# Patient Record
Sex: Female | Born: 1965
Health system: Southern US, Community
[De-identification: ages and names within clinical notes are randomized; demographics above are authoritative.]

## PROBLEM LIST (undated history)

## (undated) DIAGNOSIS — R350 Frequency of micturition: Secondary | ICD-10-CM

## (undated) DIAGNOSIS — R351 Nocturia: Secondary | ICD-10-CM

## (undated) DIAGNOSIS — Z923 Personal history of irradiation: Secondary | ICD-10-CM

## (undated) DIAGNOSIS — F419 Anxiety disorder, unspecified: Secondary | ICD-10-CM

## (undated) DIAGNOSIS — J302 Other seasonal allergic rhinitis: Secondary | ICD-10-CM

## (undated) DIAGNOSIS — N3010 Interstitial cystitis (chronic) without hematuria: Secondary | ICD-10-CM

## (undated) DIAGNOSIS — Z8619 Personal history of other infectious and parasitic diseases: Secondary | ICD-10-CM

## (undated) DIAGNOSIS — G35 Multiple sclerosis: Secondary | ICD-10-CM

## (undated) DIAGNOSIS — Z8709 Personal history of other diseases of the respiratory system: Secondary | ICD-10-CM

## (undated) DIAGNOSIS — R7303 Prediabetes: Secondary | ICD-10-CM

## (undated) DIAGNOSIS — M1990 Unspecified osteoarthritis, unspecified site: Secondary | ICD-10-CM

## (undated) DIAGNOSIS — G43909 Migraine, unspecified, not intractable, without status migrainosus: Secondary | ICD-10-CM

## (undated) DIAGNOSIS — N329 Bladder disorder, unspecified: Secondary | ICD-10-CM

## (undated) DIAGNOSIS — D126 Benign neoplasm of colon, unspecified: Secondary | ICD-10-CM

## (undated) DIAGNOSIS — F329 Major depressive disorder, single episode, unspecified: Secondary | ICD-10-CM

## (undated) DIAGNOSIS — F32A Depression, unspecified: Secondary | ICD-10-CM

## (undated) DIAGNOSIS — C801 Malignant (primary) neoplasm, unspecified: Secondary | ICD-10-CM

## (undated) DIAGNOSIS — O1490 Unspecified pre-eclampsia, unspecified trimester: Secondary | ICD-10-CM

## (undated) DIAGNOSIS — K648 Other hemorrhoids: Secondary | ICD-10-CM

## (undated) DIAGNOSIS — R3915 Urgency of urination: Secondary | ICD-10-CM

## (undated) DIAGNOSIS — K602 Anal fissure, unspecified: Secondary | ICD-10-CM

## (undated) DIAGNOSIS — G9050 Complex regional pain syndrome I, unspecified: Secondary | ICD-10-CM

## (undated) HISTORY — DX: Depression, unspecified: F32.A

## (undated) HISTORY — DX: Other hemorrhoids: K64.8

## (undated) HISTORY — DX: Migraine, unspecified, not intractable, without status migrainosus: G43.909

## (undated) HISTORY — DX: Major depressive disorder, single episode, unspecified: F32.9

## (undated) HISTORY — PX: APPENDECTOMY: SHX54

## (undated) HISTORY — DX: Unspecified osteoarthritis, unspecified site: M19.90

## (undated) HISTORY — DX: Complex regional pain syndrome I, unspecified: G90.50

## (undated) HISTORY — DX: Anal fissure, unspecified: K60.2

## (undated) HISTORY — DX: Multiple sclerosis: G35

## (undated) HISTORY — PX: DILATION AND CURETTAGE OF UTERUS: SHX78

## (undated) HISTORY — DX: Unspecified pre-eclampsia, unspecified trimester: O14.90

## (undated) HISTORY — DX: Benign neoplasm of colon, unspecified: D12.6

## (undated) HISTORY — DX: Anxiety disorder, unspecified: F41.9

## (undated) HISTORY — DX: Interstitial cystitis (chronic) without hematuria: N30.10

---
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
## 1983-09-21 HISTORY — PX: OTHER SURGICAL HISTORY: SHX169

## 1992-09-20 DIAGNOSIS — O1490 Unspecified pre-eclampsia, unspecified trimester: Secondary | ICD-10-CM

## 1992-09-20 HISTORY — DX: Unspecified pre-eclampsia, unspecified trimester: O14.90

## 1999-01-21 ENCOUNTER — Other Ambulatory Visit: Admission: RE | Admit: 1999-01-21 | Discharge: 1999-01-21 | Payer: Self-pay | Admitting: Obstetrics and Gynecology

## 1999-12-28 ENCOUNTER — Encounter: Admission: RE | Admit: 1999-12-28 | Discharge: 1999-12-28 | Payer: Self-pay | Admitting: Specialist

## 1999-12-28 ENCOUNTER — Encounter: Payer: Self-pay | Admitting: Specialist

## 2000-02-09 ENCOUNTER — Encounter: Admission: RE | Admit: 2000-02-09 | Discharge: 2000-03-03 | Payer: Self-pay | Admitting: Specialist

## 2000-03-18 ENCOUNTER — Other Ambulatory Visit: Admission: RE | Admit: 2000-03-18 | Discharge: 2000-03-18 | Payer: Self-pay | Admitting: Obstetrics and Gynecology

## 2000-03-31 ENCOUNTER — Encounter: Payer: Self-pay | Admitting: Obstetrics and Gynecology

## 2000-03-31 ENCOUNTER — Ambulatory Visit (HOSPITAL_COMMUNITY): Admission: RE | Admit: 2000-03-31 | Discharge: 2000-03-31 | Payer: Self-pay | Admitting: Obstetrics and Gynecology

## 2001-04-21 ENCOUNTER — Encounter: Payer: Self-pay | Admitting: Obstetrics and Gynecology

## 2001-04-21 ENCOUNTER — Ambulatory Visit (HOSPITAL_COMMUNITY): Admission: RE | Admit: 2001-04-21 | Discharge: 2001-04-21 | Payer: Self-pay | Admitting: Obstetrics and Gynecology

## 2001-06-23 ENCOUNTER — Encounter: Payer: Self-pay | Admitting: Obstetrics and Gynecology

## 2001-06-23 ENCOUNTER — Ambulatory Visit (HOSPITAL_COMMUNITY): Admission: RE | Admit: 2001-06-23 | Discharge: 2001-06-23 | Payer: Self-pay | Admitting: Obstetrics and Gynecology

## 2001-08-15 ENCOUNTER — Other Ambulatory Visit: Admission: RE | Admit: 2001-08-15 | Discharge: 2001-08-15 | Payer: Self-pay | Admitting: Obstetrics and Gynecology

## 2002-04-10 ENCOUNTER — Encounter: Payer: Self-pay | Admitting: Obstetrics and Gynecology

## 2002-04-10 ENCOUNTER — Ambulatory Visit (HOSPITAL_COMMUNITY): Admission: RE | Admit: 2002-04-10 | Discharge: 2002-04-10 | Payer: Self-pay | Admitting: Obstetrics and Gynecology

## 2002-06-26 ENCOUNTER — Encounter: Payer: Self-pay | Admitting: Emergency Medicine

## 2002-06-26 ENCOUNTER — Emergency Department (HOSPITAL_COMMUNITY): Admission: EM | Admit: 2002-06-26 | Discharge: 2002-06-27 | Payer: Self-pay | Admitting: Emergency Medicine

## 2003-10-11 ENCOUNTER — Other Ambulatory Visit: Admission: RE | Admit: 2003-10-11 | Discharge: 2003-10-11 | Payer: Self-pay | Admitting: Obstetrics and Gynecology

## 2003-12-02 ENCOUNTER — Encounter (INDEPENDENT_AMBULATORY_CARE_PROVIDER_SITE_OTHER): Payer: Self-pay | Admitting: Specialist

## 2003-12-02 ENCOUNTER — Ambulatory Visit (HOSPITAL_COMMUNITY): Admission: RE | Admit: 2003-12-02 | Discharge: 2003-12-02 | Payer: Self-pay | Admitting: Obstetrics and Gynecology

## 2003-12-02 HISTORY — PX: OTHER SURGICAL HISTORY: SHX169

## 2007-07-30 ENCOUNTER — Emergency Department (HOSPITAL_COMMUNITY): Admission: EM | Admit: 2007-07-30 | Discharge: 2007-07-31 | Payer: Self-pay | Admitting: Emergency Medicine

## 2007-08-01 ENCOUNTER — Ambulatory Visit: Payer: Self-pay | Admitting: Family Medicine

## 2007-08-01 DIAGNOSIS — T148XXA Other injury of unspecified body region, initial encounter: Secondary | ICD-10-CM | POA: Insufficient documentation

## 2007-08-08 ENCOUNTER — Ambulatory Visit: Payer: Self-pay | Admitting: Family Medicine

## 2007-08-08 DIAGNOSIS — J069 Acute upper respiratory infection, unspecified: Secondary | ICD-10-CM | POA: Insufficient documentation

## 2007-09-18 ENCOUNTER — Telehealth (INDEPENDENT_AMBULATORY_CARE_PROVIDER_SITE_OTHER): Payer: Self-pay | Admitting: Family Medicine

## 2007-09-18 ENCOUNTER — Inpatient Hospital Stay (HOSPITAL_COMMUNITY): Admission: EM | Admit: 2007-09-18 | Discharge: 2007-09-20 | Payer: Self-pay | Admitting: Emergency Medicine

## 2007-09-19 ENCOUNTER — Ambulatory Visit: Payer: Self-pay | Admitting: Internal Medicine

## 2007-10-02 ENCOUNTER — Ambulatory Visit: Payer: Self-pay | Admitting: Family Medicine

## 2007-10-02 DIAGNOSIS — N390 Urinary tract infection, site not specified: Secondary | ICD-10-CM | POA: Insufficient documentation

## 2007-10-02 DIAGNOSIS — E876 Hypokalemia: Secondary | ICD-10-CM | POA: Insufficient documentation

## 2007-10-02 DIAGNOSIS — D509 Iron deficiency anemia, unspecified: Secondary | ICD-10-CM | POA: Insufficient documentation

## 2007-10-05 ENCOUNTER — Encounter (INDEPENDENT_AMBULATORY_CARE_PROVIDER_SITE_OTHER): Payer: Self-pay | Admitting: *Deleted

## 2007-10-05 LAB — CONVERTED CEMR LAB
Eosinophils Relative: 2.6 % (ref 0.0–5.0)
GFR calc Af Amer: 101 mL/min
GFR calc non Af Amer: 83 mL/min
Lymphocytes Relative: 31.9 % (ref 12.0–46.0)
Monocytes Relative: 1.1 % — ABNORMAL LOW (ref 3.0–11.0)
Neutrophils Relative %: 64.4 % (ref 43.0–77.0)
Potassium: 4.3 meq/L (ref 3.5–5.1)
RDW: 18.7 % — ABNORMAL HIGH (ref 11.5–14.6)
WBC: 11 10*3/uL — ABNORMAL HIGH (ref 4.5–10.5)

## 2007-10-18 ENCOUNTER — Other Ambulatory Visit: Admission: RE | Admit: 2007-10-18 | Discharge: 2007-10-18 | Payer: Self-pay | Admitting: Obstetrics and Gynecology

## 2007-11-22 ENCOUNTER — Encounter (INDEPENDENT_AMBULATORY_CARE_PROVIDER_SITE_OTHER): Payer: Self-pay | Admitting: Family Medicine

## 2011-02-02 NOTE — H&P (Signed)
NAMELEITH, Logan NO.:  [id number]   MEDICAL RECORD NO.:  [id number]          PATIENT TYPE:  INP   LOCATION:  0108                         FACILITY:  [facility]   PHYSICIAN:  Hollice Espy, M.D.DATE OF BIRTH:  1966-01-17   DATE OF ADMISSION:  09/18/2007  DATE OF DISCHARGE:                              HISTORY & PHYSICAL   PRIMARY CARE PHYSICIAN:  Dr. Leanne Chang.   CHIEF COMPLAINT:  Back pain radiating to the abdomen.   HISTORY OF PRESENT ILLNESS:  The patient is a 45 year old white female  with no past medical history who started having problems complaining of  dysuria on Saturday the 27th.  Her symptoms persisted for several days  and then she went to [facility] on the 28th and was seen and started  on p.o. Cipro.  However, her symptoms persisted with dysuria and then  she started having flank pain radiating to her abdomen today. She became  concerned and she came to the emergency room.  When she presented to the  emergency room she was found to have a white count of 11.6 thousand with  a 74% shift. The rest of her labs were notable for a hemoglobin of 9.8  with an MCV of 77.  She was also noted to have trace leukocyte esterase.  However, when they did a CT scan of the abdomen and pelvis this showed  signs consistent with a pyelonephritis on the right hand side.  The  patient was started on IV Rocephin.  Currently she complains of some  pains pushing her back, she says she feels like there are knives  stabbing, they are right greater than left, both radiating to the front.  She has some mild nausea but no headaches, vision changes, dysphagia, no  chest pain, palpitations, shortness of breath, wheeze, cough, no  abdominal pain other than described above.  No hematuria but she does  have dysuria, no constipation or diarrhea, focal extremity numbness,  weakness or pain.   REVIEW OF SYSTEMS:  Otherwise negative.   PAST MEDICAL HISTORY:  Previous  history of a spontaneous pneumothorax.   MEDICATIONS:  She is on p.o. Cipro started yesterday.   ALLERGIES:  DEMEROL AND MORPHINE.   SOCIAL HISTORY:  She denies tobacco, alcohol or hard drug use.  She  lives with her husband.   FAMILY HISTORY:  Noncontributory.   PHYSICAL EXAMINATION:  VITAL SIGNS:  On admission temperature is 100.1,  heart rate 120, now down to 79.  Blood pressure 123/84, respirations 28.  Oxygen saturation 99% on room air.  GENERAL:  She is alert and oriented x3, in no acute distress.  HEENT:  Normocephalic, atraumatic. Mucous membranes are slightly dry.  NECK:  She has no carotid bruits.  HEART:  Regular rate and rhythm S1, S2.  LUNGS:  Clear to auscultation bilaterally.  ABDOMEN:  Soft, nontender. She has minimal bilateral flank pain, no  generalized abdominal tenderness.  EXTREMITIES:  No clubbing, cyanosis or edema.   LABORATORY DATA:  Urinalysis with trace leukocyte esterase, white cells  0-2, sodium 136, potassium 3, chloride  103, bicarb 23, BUN 6, creatinine  0.7, glucose 109.   White count 11.6,  thousand, H&H are 9.8 and 29, MCV of 77, platelet  count 269,000, no shift. Liver function tests are normal with the  exception of albumin slightly low at 3.4, lipase level is normal.   CT scan notes pyelonephritis (this is passed down from the ER  attending).  The current radiology system is down.   ASSESSMENT AND PLAN:  1. Pyelonephritis. Treat with IV Rocephin plus p.r.n. pain, nausea,      and Tylenol.  2. Hypokalemia. Will replace.  3. Microcytic anemia.  Outpatient workup.      Hollice Espy, M.D.  Electronically Signed     SKK/MEDQ  D:  09/18/2007  T:  09/18/2007  Job:  [id number]   cc:   Leanne Chang, M.D.  Fax: [phone]

## 2011-02-02 NOTE — Discharge Summary (Signed)
NAMEANALIZ, TVEDT                 ACCOUNT NO.:  [id number]   MEDICAL RECORD NO.:  [id number]          PATIENT TYPE:  INP   LOCATION:  1532                         FACILITY:  [facility]   PHYSICIAN:  Raenette Rover. Felicity Coyer, MDDATE OF BIRTH:  June 25, 1966   DATE OF ADMISSION:  09/18/2007  DATE OF DISCHARGE:                               DISCHARGE SUMMARY   DISCHARGE DIAGNOSES:  1. Acute pyelonephritis by clinical symptoms and CT changes.  Urine      culture; no growth.  Continue oral antibiotics as detailed below.  2. Hypokalemia; etiology unclear.  Status post replacement and      resolved.  Discharge potassium 36.  3. Iron deficiency anemia in a perimenopausal woman.  Hemoglobin at      discharge 9.2.  Iron level 10.  4. Herpes labialis (oral cold sores).  Continue Valtrex therapy.  5. Tobacco abuse.  Recommend cessation.   DISCHARGE MEDICATIONS:  Include:  1. Ceftin 500 mg twice a day times 7 days.  2. Valtrex 500 mg daily times 7 days.  3. Iron sulfate 325 mg once daily.  4. The patient is instructed to stop taking Cipro.   HOSPITAL FOLLOWUP:  Hospital follow-up was with primary care physician,  Dr. Leanne Chang, for Monday, October 01, 2006, at 4 o'clock p.m.  She  was notified to return to the emergency room or call MD if recurrent  fever, inability to tolerate by mouth, or other problems prior to this  time.   DISPOSITION:  The patient is discharged home in medically stable and  improved condition.  She is tolerating by mouth, decreased pain,  afebrile, and hemodynamically stable.   HOSPITAL COURSE BY PROBLEMS:  1. Right flank pain with acute pyelonephritis.  The patient is a 45-      year-old woman recently started on Cipro by her primary medical      doctor for UTI; however due to progressive back/flank pain and      continued dysuria, she came to the emergency room for further      evaluation.  She was found to have a white count of 11.6 and      leukocyte esterase in her  urine, so a CT scan was performed for      further evaluation and did show signs consistent with right-sided      acute pyelonephritis given perinephric stranding.  She was thus      referred for admission and begun on IV Rocephin.  She remained      afebrile and her white count after 48 hours of therapy was reduced      to normal at 5.5.  Her pain symptoms have also improved with      minimal need for analgesia.  Her nausea symptoms have also      resolved.  She is tolerating p.o., including her medicines, and      will be changed to oral Ceftin to continue another 1 week of      therapy to complete 10 days of separate antibiotic therapy.  Cipro      prior  to admission was discontinued.  Again, urine culture showed      no growth to date suggesting probable sterilization by Cipro prior      to admission.  2. Iron deficiency anemia.  The patient's initial hemoglobin was 9.8.      She is perimenopausal with irregular and heavy periods at this      time.  She has no known personal history of cancer, but a strong      family history of cancer, including a sister who passed of breast      cancer and a father who recently passed of lung and throat cancer.      The patient is a smoker and she was advised of its increased      association with personal cancer risk and, therefore, her need for      cessation.  There was no ongoing evidence of acute blood loss at      this time and as she is hemodynamically stable, we will begin iron      supplementation and refer her to outpatient primary medical doctor      for further evaluation, including possible GYN evaluation, and/or      further cancer screening.  Of note, the patient is 45 years of age      and no history of colon cancer, but may also consider GI evaluation      if found to be for fecal occult positive, which was not tested      during this time.  Iron level of 10 with a normal ferritin, still      consistent with iron deficiency  anemia.  3. Herpes labialis.  The patient had an outbreak of oral cold sores      this hospitalization for which she was begun on Valtrex with      decreased symptomatic improvement, and will be continued on a 1-      week course of therapy for this with consideration of chronic      suppressive needs given history of frequent recurrence; defer to      primary medical doctor.  4. Hypokalemia.  Potassium of 3.0 on admission without clear cause.      She was not on diuretics, nor did she claim a history of GI loss.      This was supplemented and normalized at 3.6 prior to discharge.      Further outpatient followup on an as-needed basis per primary      medical doctor.      Valerie A. Felicity Coyer, MD  Electronically Signed     VAL/MEDQ  D:  09/20/2007  T:  09/20/2007  Job:  [id number]

## 2011-02-05 NOTE — Op Note (Signed)
NAME:  Stacy Logan, Stacy Logan                           ACCOUNT NO.:  [id number]   MEDICAL RECORD NO.:  [id number]                   PATIENT TYPE:  AMB   LOCATION:  SDC                                  FACILITY:  [facility]   PHYSICIAN:  Artist Pais, M.D.                 DATE OF BIRTH:  1966-07-19   DATE OF PROCEDURE:  12/02/2003  DATE OF DISCHARGE:                                 OPERATIVE REPORT   PREOPERATIVE DIAGNOSES:  1. Menometrorrhagia.  2. Endometrial polyp on sonohysterogram.   POSTOPERATIVE DIAGNOSES:  1. Menometrorrhagia.  2. Endometrial polyp on sonohysterogram.   PROCEDURES:  Dilatation and curettage, hysteroscopy, and polypectomy.   SURGEON:  Artist Pais, M.D.   ANESTHESIA:  Monitored anesthesia care plus 20 mL 1% lidocaine paracervical  block.   ESTIMATED BLOOD LOSS:  Minimal.   DRAINS:  None.   COMPLICATIONS:  None.   FINDINGS:  Polyp in endometrial lining, removed in its entirety.  I was  unable to place the scope past a ridge at the internal os.   DESCRIPTION OF OPERATION:  The patient was brought to the operating room,  identified on the operating room table.  After induction of adequate MAC  analgesia, the patient was placed in the dorsal lithotomy position and  prepped and draped in the usual sterile fashion.  The bladder was straight-  catheterized for approximately 50 mL of clear yellow urine.  The bimanual  examination was performed twice, and the patient both times was noted to be  retroverted.  The speculum was placed, the posterior lip of the cervix was  infiltrated with 1 mL of 1% lidocaine.  The remaining 20 mL were placed for  a paracervical block.  The uterus sounded to approximately 7 cm.  Interestingly, even though the uterus tracked in a retroverted fashion, the  endometrial canal deviated to the left and almost anteriorly despite the  fact that the uterus is retroverted.  I was able to gently dilate the  patient up to a #23 Pratt dilator.   Dilatation proceeded carefully and  gently to decrease the risk of uterine perforation.  I did just let the  small curette follow the natural path of the endocervical canal to begin the  dilatation process.  Subsequently the [facility] hysteroscope was placed and using  sorbitol as a distending medium, attempted to place the scope under direct  visualization into the endometrial cavity.  I was able to get it up to the  internal os; however, there was a ridge I could feel on dilatation that  would not resolve enough to pass the scope without force and I did not want  to cause a perforation.  I even changed the speculum to the anterior lip of  the cervix despite the fact that the uterus tracked retrovertedly but the  canal tracked to the left and anteriorly.  I could see up into the  endometrial cavity but was unable to get the scope past this ridge.  Subsequently I could not use the serrated curette as it would not pass past  the ridge, and I used the endocervical curette and curettage was performed  in a systematic clockwise fashion.  Initially at the fundus I did obtain the  polyp on the first curettage.  Subsequently the remainder of the uterus was  gently curetted until a good cry was heard all around.  I passed the Randall  stone forceps and additional tissue was obtained; in fact, copious tissue  was obtained and sent to pathology for examination.  I again placed the  scope and was able to get it up to the ridge again and could see that the  endometrium had been well-sampled as far as I could see; however, I could  not get the scope past the ridge even though I tried to go to a #25 dilator.  However, it was this surgeon's opinion that what we came to do here had been  accomplished and that I was able to obtain the polyp and endometrial  curettings.  At that point the procedure was then terminated.  The patient  tolerated the procedure well without apparent complications and was  transferred  to the recovery room in stable condition after all instrument,  sponge, and needle counts were correct.  The patient was given a postop D&C  sheet, urged to call with any problems, to take ibuprofen 400-600 mg every  six hours as needed for pain, and to call with any problems.  She will call  if she is soaking up a large pad an hour for three consecutive hours.  She  is urged to refrain from any heavy lifting for two weeks.  She will return  to the office in two weeks for a postop exam, and she will also refrain from  anything in her vagina until that time.  I have asked her not go to work for  24 hours.                                               Artist Pais, M.D.    DC/MEDQ  D:  12/02/2003  T:  12/02/2003  Job:  [id number]

## 2011-02-05 NOTE — H&P (Signed)
NAME:  Stacy Logan, Stacy Logan                           ACCOUNT NO.:  [id number]   MEDICAL RECORD NO.:  [id number]                   PATIENT TYPE:  AMB   LOCATION:  SDC                                  FACILITY:  [facility]   PHYSICIAN:  Artist Pais, M.D.                 DATE OF BIRTH:  08-24-1966   DATE OF ADMISSION:  12/02/2003  DATE OF DISCHARGE:                                HISTORY & PHYSICAL   HISTORY OF PRESENT ILLNESS:  The patient is a 45 year old Caucasian female,  para 1, who presented two weeks ago complaining of menstrual migraines, but  using Vivelle patch with excellent relief.  She had intercourse two to three  weeks ago with a new partner and has been complaining of bleeding three  times during the past month.  She bled for five days beginning on October 19, 2003.  Around November 04, 2003, she began bleeding and bled until  November 06, 2003.  Subsequently she bled on November 12, 2002, and November 14, 2003.  She feels very tired and wiped out.  She is complaining of cramps  and bloating.  She subsequently underwent a pelvic ultrasound which revealed  the uterus to be retroverted, 9 x 4.5 x 5.3 cm.  The endometrium was found  to be thick at 10 mm with two echogenic foci.  The right ovary was found to  have a mixed cystic mass measuring 48 x 45 mm containing cystic areas and  scattered __________ level echoes and a more solid level thought to be a  hemorrhagic cyst.  The left ovary had a cystic mass, 2.6 x 1.9, with thin  walls which were smooth, echo free, and avascular.  She declined STD  testing.  Because of her thick endometrium, she underwent a sonohysterogram  and was found to have a fundal posterior polyp measuring 11 x 5 x 10 mm.  Her sonohysterogram was performed on November 26, 2003, and at that point she  had been bleeding almost constantly she said for three weeks.  Although the  pad is not full, she does have to change the pad every day.  She was advised  to undergo a  D&C, hysteroscopy, and polypectomy.  The risks of surgery,  including anesthetic complication, hemorrhage, infection, damage to adjacent  structures, including bladder, bowel, blood vessels, and ureters were  discussed with the patient.  She was made aware of the risks of uterine  perforation which could result in overwhelming life-threatening hemorrhage  requiring an emergent hysterectomy or uterine perforation which could result  in bowel damage requiring emergent colostomy or which could result in  overwhelming life-threatening peritonitis.  She has expressed an  understanding of and acceptance of these results, but desires to proceed  with the surgery.  She does know that I have always thought that she might  be having anovulatory cycles as sometimes her cycles were  irregular.  She is  admonished after this to make sure that she keeps track of her periods.  She  does express understanding of and acceptance of these risks and does desire  to proceed with surgery.   OBSTETRICAL AND GYNECOLOGICAL HISTORY:  The patient uses condoms for  contraception.  Normally her cycles are regular every 28 days with a four to  five-day duration of flow.  She notes that her first two days are heavy and  she changes her sanitary protection four to five times daily, mostly for  fastidiousness.  She had a history of one abnormal Pap with a repeat being  normal.  Earlier a few years ago, she did have a history of some amenorrhea  which appears to have resolved.   PAST MEDICAL HISTORY:  1. Back pain.  2. Situational depression.  3. Oral HSV.   ALLERGIES:  No known drug allergies.   CURRENT MEDICATIONS:  1. Valtrex p.r.n. for fever blisters.  2. Paxil CR 12.5 mg daily for situational depression.   PAST SURGICAL HISTORY:  1. Cesarean section.  2. One additional laparotomy and laparoscopy, one for removal of ovarian     cyst and a laparoscopic surgery to assess for possible adhesion     formation.    FAMILY HISTORY:  There is no family history of colon, ovarian, or prostate  cancer.  Her sister was diagnosed at the age of 56 with breast cancer and  has hypertension.  She is currently 27 and alive and well.  She has another  sister, age 70, alive and well.  Her father is 56 with diabetes.  Her mother  is 60 with hypertension.  She has one young daughter, age 56, alive and well.   SOCIAL HISTORY:  The patient is self-employed as a Advertising copywriter.  Tobacco:  None.  Alcohol:  None.   REVIEW OF SYSTEMS:  Noncontributory, except as noted above.  Denies  headache, visual changes, chest pain, shortness of breath, abdominal pain,  change in bowel habits, unintentional weight loss, dysuria, urgency,  frequency, vaginal pruritus or discharge, and pain or bleeding with  intercourse.   PHYSICAL EXAMINATION:  GENERAL APPEARANCE:  A well-developed Caucasian  female.  VITAL SIGNS:  Blood pressure 110/80, heart rate 72, respiratory rate 16.  HEENT:  Normal.  NECK:  Supple without thyromegaly, adenopathy, or nodules.  CHEST:  Clear to auscultation.  BREASTS:  Symmetrical without masses.  No dimpling, retraction, or nipple  discharge.  CARDIAC:  Regular rate and rhythm without extra sounds or murmurs.  ABDOMEN:  Soft and nontender.  No hepatosplenomegaly or masses.  EXTREMITIES:  No edema.  NEUROLOGIC:  Oriented x 3.  Grossly normal.  PELVIC:  Normal external female genitalia.  No vulvar, vaginal, or cervical  lesions.  A Pap smear was performed and was noted to be within normal limits  on October 11, 2003.  Bimanual examination reveals the uterus to be small,  mobile, anteverted, and nontender without any adnexal mass palpated.  Subsequently on ultrasound she was found to have possible retroverted  uterus.  RECTAL:  Excellent sphincter tone.  Confirms pelvic exam.  No masses  palpated.  ASSESSMENT AND PLAN:  The patient is a 45 year old Caucasian female with  menometrorrhagia and endometrial  polyp.  At the time of her complete  physical, she did complain of dysmenorrhea and she was offered oral  contraceptives.  I plan to treat her with oral contraceptives initially and  then do an ultrasound if this  did not cause resolution of her dysmenorrhea,  but it may well be that the polyp is the etiology of this dysmenorrhea.  The  patient is admitted for a D&C, hysteroscopy, and polypectomy due to  menometrorrhagia and endometrial polyp found on sonohysterogram.  The risks  have been explained to her.  She expresses understanding of and acceptance  of the risks and desires to proceed with surgery.                                               Artist Pais, M.D.   DC/MEDQ  D:  12/01/2003  T:  12/01/2003  Job:  [id number]   cc:   [facility]  [address] [place].  [address]

## 2011-06-25 LAB — URINE MICROSCOPIC-ADD ON

## 2011-06-25 LAB — IRON AND TIBC
Iron: 10 — ABNORMAL LOW
Saturation Ratios: 3 — ABNORMAL LOW
TIBC: 297
UIBC: 287

## 2011-06-25 LAB — DIFFERENTIAL
Basophils Relative: 3 — ABNORMAL HIGH
Lymphocytes Relative: 13
Lymphs Abs: 1.5
Monocytes Absolute: 1.1 — ABNORMAL HIGH
Monocytes Relative: 10
Neutro Abs: 8.6 — ABNORMAL HIGH
Neutrophils Relative %: 74

## 2011-06-25 LAB — CBC
HCT: 27.2 — ABNORMAL LOW
HCT: 29.4 — ABNORMAL LOW
Hemoglobin: 9.8 — ABNORMAL LOW
MCV: 77.2 — ABNORMAL LOW
Platelets: 269
RBC: 3.82 — ABNORMAL LOW
RDW: 19.3 — ABNORMAL HIGH
WBC: 11.6 — ABNORMAL HIGH

## 2011-06-25 LAB — BASIC METABOLIC PANEL
BUN: 5 — ABNORMAL LOW
Calcium: 9.1
Chloride: 106
Creatinine, Ser: 0.73
GFR calc non Af Amer: >60
Glucose, Bld: 105 — ABNORMAL HIGH

## 2011-06-25 LAB — URINALYSIS, ROUTINE W REFLEX MICROSCOPIC
Bilirubin Urine: NEGATIVE
Glucose, UA: NEGATIVE
Ketones, ur: NEGATIVE
Nitrite: NEGATIVE
Specific Gravity, Urine: 1.001 — ABNORMAL LOW
Urobilinogen, UA: 0.2
pH: 6

## 2011-06-25 LAB — URINE CULTURE: Culture: NO GROWTH

## 2011-06-25 LAB — HEPATIC FUNCTION PANEL
ALT: 19
Alkaline Phosphatase: 89
Indirect Bilirubin: 0.7

## 2011-06-25 LAB — LIPASE, BLOOD: Lipase: 11

## 2014-01-16 ENCOUNTER — Encounter: Payer: Self-pay | Admitting: General Practice

## 2014-01-16 ENCOUNTER — Encounter: Payer: Self-pay | Admitting: Family Medicine

## 2014-01-16 ENCOUNTER — Ambulatory Visit (INDEPENDENT_AMBULATORY_CARE_PROVIDER_SITE_OTHER): Payer: 59 | Admitting: Family Medicine

## 2014-01-16 VITALS — BP 132/80 | HR 105 | Temp 98.5°F | Resp 16 | Wt 122.4 lb

## 2014-01-16 DIAGNOSIS — F418 Other specified anxiety disorders: Secondary | ICD-10-CM

## 2014-01-16 DIAGNOSIS — R81 Glycosuria: Secondary | ICD-10-CM

## 2014-01-16 DIAGNOSIS — B001 Herpesviral vesicular dermatitis: Secondary | ICD-10-CM

## 2014-01-16 DIAGNOSIS — N390 Urinary tract infection, site not specified: Secondary | ICD-10-CM

## 2014-01-16 DIAGNOSIS — B009 Herpesviral infection, unspecified: Secondary | ICD-10-CM

## 2014-01-16 DIAGNOSIS — F341 Dysthymic disorder: Secondary | ICD-10-CM

## 2014-01-16 DIAGNOSIS — R300 Dysuria: Secondary | ICD-10-CM

## 2014-01-16 DIAGNOSIS — R319 Hematuria, unspecified: Secondary | ICD-10-CM

## 2014-01-16 LAB — HEPATIC FUNCTION PANEL
ALBUMIN: 4.4 g/dL (ref 3.5–5.2)
ALT: 13 U/L (ref 0–35)
AST: 19 U/L (ref 0–37)
Alkaline Phosphatase: 71 U/L (ref 39–117)
BILIRUBIN TOTAL: 0.2 mg/dL — AB (ref 0.3–1.2)
Bilirubin, Direct: 0 mg/dL (ref 0.0–0.3)
Total Protein: 7.5 g/dL (ref 6.0–8.3)

## 2014-01-16 LAB — BASIC METABOLIC PANEL
BUN: 11 mg/dL (ref 6–23)
CHLORIDE: 102 meq/L (ref 96–112)
CO2: 26 meq/L (ref 19–32)
Calcium: 9.5 mg/dL (ref 8.4–10.5)
Creatinine, Ser: 0.7 mg/dL (ref 0.4–1.2)
GFR: 91.1 mL/min (ref >60.00)
GLUCOSE: 160 mg/dL — AB (ref 70–99)
Potassium: 3.6 mEq/L (ref 3.5–5.1)
SODIUM: 137 meq/L (ref 135–145)

## 2014-01-16 LAB — LIPID PANEL
CHOL/HDL RATIO: 3
Cholesterol: 169 mg/dL (ref 0–200)
HDL: 54.1 mg/dL (ref >39.00)
LDL CALC: 98 mg/dL (ref 0–99)
Triglycerides: 87 mg/dL (ref 0.0–149.0)
VLDL: 17.4 mg/dL (ref 0.0–40.0)

## 2014-01-16 LAB — POCT URINALYSIS DIPSTICK
Bilirubin, UA: NEGATIVE
Glucose, UA: 250
KETONES UA: NEGATIVE
Leukocytes, UA: NEGATIVE
NITRITE UA: NEGATIVE
PH UA: 5
PROTEIN UA: NEGATIVE
Spec Grav, UA: <=1.005
UROBILINOGEN UA: 0.2

## 2014-01-16 LAB — TSH: TSH: 0.37 u[IU]/mL (ref 0.35–5.50)

## 2014-01-16 LAB — HEMOGLOBIN A1C: Hgb A1c MFr Bld: 5.6 % (ref 4.6–6.5)

## 2014-01-16 LAB — GLUCOSE, POCT (MANUAL RESULT ENTRY): POC Glucose: 187 mg/dl — AB (ref 70–99)

## 2014-01-16 MED ORDER — CEPHALEXIN 500 MG PO CAPS: 500.0000 mg | ORAL_CAPSULE | Freq: Two times a day (BID) | ORAL | Status: AC

## 2014-01-16 MED ORDER — CITALOPRAM HYDROBROMIDE 20 MG PO TABS: 20.0000 mg | ORAL_TABLET | Freq: Every day | ORAL | Status: DC

## 2014-01-16 MED ORDER — VALACYCLOVIR HCL 1 G PO TABS: ORAL_TABLET | ORAL | Status: DC

## 2014-01-16 NOTE — Progress Notes (Signed)
Pre visit review using our clinic review tool, if applicable. No additional management support is needed unless otherwise documented below in the visit note. 

## 2014-01-16 NOTE — Patient Instructions (Signed)
We'll notify you of your lab results and determine when you need to follow up Start the Keflex for probable UTI (urine sent for culture) Start the Celexa daily for depression Use the Valtrex x2 doses when you develop a cold sore Hang in there!!!

## 2014-01-16 NOTE — Progress Notes (Signed)
   Subjective:    Patient ID: Stacy Logan, female    DOB: 1966-02-11, 48 y.o.   MRN: [id number]  HPI New to establish.  Previous MD- Cletus Gash (over 6 yrs), GYN- Sander Radon  UTI- 'my back is going to break in two, i'm burning so bad'.  + urinary hesitancy.  Some urinary incontinence.  sxs improve w/ AZO.  Pt reports sxs started 5 yrs ago.  Intermittent fevers.  Recurrent cold sores- ongoing issue for pt.  Has one currently.  Asking for medication to help.  Depression- pt is tearful in office today, unable to speak w/o crying.  Keeps apologizing.  Previously on medication but has not taken recently.  'i'm very depressed'.  + family hx of depression.   Review of Systems For ROS see HPI     Objective:   Physical Exam  Vitals reviewed. Constitutional: She is oriented to person, place, and time.  Thin, tearful  HENT:  Head: Normocephalic and atraumatic.  Neck: Normal range of motion. Neck supple. No thyromegaly present.  Cardiovascular: Normal rate, regular rhythm, normal heart sounds and intact distal pulses.   Pulmonary/Chest: Effort normal and breath sounds normal. No respiratory distress. She has no wheezes. She has no rales.  Abdominal: Soft. Bowel sounds are normal. She exhibits no distension. There is tenderness (+ suprapubic tenderness). There is no rebound and no guarding.  Musculoskeletal: She exhibits no edema.  Lymphadenopathy:    She has no cervical adenopathy.  Neurological: She is alert and oriented to person, place, and time. No cranial nerve deficit. Coordination normal.  Psychiatric:  Tearful, anxious          Assessment & Plan:

## 2014-01-17 ENCOUNTER — Telehealth: Payer: Self-pay | Admitting: Family Medicine

## 2014-01-17 NOTE — Telephone Encounter (Signed)
Please advise 

## 2014-01-17 NOTE — Telephone Encounter (Signed)
Caller name: Gali Relation to pt: Call back McCracken:  Reason for call:  Pt states that one of the current medications she is taking is giving her a severe headache which is in turn making her sick on her stomach.  Pt does not know which medicine is causing the issue, but she did take all 3 new meds at the same time yesterday.  Please contact pt to advise.

## 2014-01-17 NOTE — Telephone Encounter (Signed)
Pt should continue the keflex twice daily- this is for possible UTI and the most important right now Once she is done w/ the keflex, should restart Celexa for depression DOES NOT need valtrex right now b/c the cold sore is already present- should let this one heal on its own and then take the valtrex at the first sign of next outbreak

## 2014-01-17 NOTE — Telephone Encounter (Signed)
Advised patient as stated below.  Patient read back recommendations with accuracy.  Patient is currently treating headache with tylenol sinus (she also has some nasal congestion).  She was encouraged to eat dry toast or crackers and light meals for nausea relief.  She stated understanding and agreed with plan.  No further questions or concerns voiced.

## 2014-01-18 ENCOUNTER — Other Ambulatory Visit: Payer: Self-pay | Admitting: Family Medicine

## 2014-01-18 DIAGNOSIS — N3010 Interstitial cystitis (chronic) without hematuria: Secondary | ICD-10-CM

## 2014-01-18 LAB — URINE CULTURE
COLONY COUNT: NO GROWTH
ORGANISM ID, BACTERIA: NO GROWTH

## 2014-01-20 NOTE — Assessment & Plan Note (Signed)
New to provider, ongoing for pt.  Start Valtrex prn.  Pt expressed understanding and is in agreement w/ plan.

## 2014-01-20 NOTE — Assessment & Plan Note (Signed)
New.  Pt's sxs and UA consistent w/ infxn although sxs starting 4-5 yrs ago is more consistent w/ interstitial cystitis.  Start abx while awaiting ucx results.  Will follow.

## 2014-01-20 NOTE — Assessment & Plan Note (Signed)
New.  Pt w/ family hx of DM.  Admits to polyuria, polydipsia- no polyphagia- and weight loss.  Check labs to r/o diabetes.

## 2014-01-20 NOTE — Assessment & Plan Note (Signed)
New to provider, ongoing for pt.  Moderate to severe at this time- pt unable to speak w/o crying.  Open to idea of starting medication.  Discussed need for counseling in addition to meds.  Will follow closely.

## 2014-01-22 ENCOUNTER — Encounter: Payer: Self-pay | Admitting: General Practice

## 2014-01-30 ENCOUNTER — Other Ambulatory Visit: Payer: Self-pay | Admitting: Urology

## 2014-02-08 ENCOUNTER — Encounter (HOSPITAL_BASED_OUTPATIENT_CLINIC_OR_DEPARTMENT_OTHER): Payer: Self-pay | Admitting: *Deleted

## 2014-02-12 ENCOUNTER — Encounter (HOSPITAL_BASED_OUTPATIENT_CLINIC_OR_DEPARTMENT_OTHER): Payer: Self-pay | Admitting: *Deleted

## 2014-02-13 ENCOUNTER — Encounter (HOSPITAL_BASED_OUTPATIENT_CLINIC_OR_DEPARTMENT_OTHER): Payer: Self-pay | Admitting: *Deleted

## 2014-02-13 NOTE — Progress Notes (Signed)
NPO AFTER MN.  ARRIVE AT 0600.  NEEDS ISTAT AND EKG.  

## 2014-02-15 ENCOUNTER — Encounter (HOSPITAL_BASED_OUTPATIENT_CLINIC_OR_DEPARTMENT_OTHER): Payer: Self-pay | Admitting: *Deleted

## 2014-02-15 ENCOUNTER — Encounter (HOSPITAL_BASED_OUTPATIENT_CLINIC_OR_DEPARTMENT_OTHER): Payer: 59 | Admitting: Anesthesiology

## 2014-02-15 ENCOUNTER — Ambulatory Visit (HOSPITAL_BASED_OUTPATIENT_CLINIC_OR_DEPARTMENT_OTHER): Payer: 59 | Admitting: Anesthesiology

## 2014-02-15 ENCOUNTER — Encounter (HOSPITAL_BASED_OUTPATIENT_CLINIC_OR_DEPARTMENT_OTHER)
Admission: RE | Disposition: A | Discharge status: Home / Self Care | Payer: Self-pay | Source: Ambulatory Visit | Attending: Urology

## 2014-02-15 ENCOUNTER — Ambulatory Visit (HOSPITAL_BASED_OUTPATIENT_CLINIC_OR_DEPARTMENT_OTHER)
Admission: RE | Admit: 2014-02-15 | Discharge: 2014-02-15 | Disposition: A | Discharge status: Home / Self Care | Payer: 59 | Source: Ambulatory Visit | Attending: Urology | Admitting: Urology

## 2014-02-15 DIAGNOSIS — Z885 Allergy status to narcotic agent status: Secondary | ICD-10-CM | POA: Insufficient documentation

## 2014-02-15 DIAGNOSIS — R300 Dysuria: Secondary | ICD-10-CM | POA: Insufficient documentation

## 2014-02-15 DIAGNOSIS — G35 Multiple sclerosis: Secondary | ICD-10-CM | POA: Insufficient documentation

## 2014-02-15 DIAGNOSIS — Z79899 Other long term (current) drug therapy: Secondary | ICD-10-CM | POA: Insufficient documentation

## 2014-02-15 DIAGNOSIS — N949 Unspecified condition associated with female genital organs and menstrual cycle: Secondary | ICD-10-CM | POA: Insufficient documentation

## 2014-02-15 DIAGNOSIS — R350 Frequency of micturition: Secondary | ICD-10-CM | POA: Insufficient documentation

## 2014-02-15 DIAGNOSIS — G894 Chronic pain syndrome: Secondary | ICD-10-CM | POA: Insufficient documentation

## 2014-02-15 DIAGNOSIS — M129 Arthropathy, unspecified: Secondary | ICD-10-CM | POA: Insufficient documentation

## 2014-02-15 DIAGNOSIS — N318 Other neuromuscular dysfunction of bladder: Secondary | ICD-10-CM | POA: Insufficient documentation

## 2014-02-15 HISTORY — DX: Personal history of other diseases of the respiratory system: Z87.09

## 2014-02-15 HISTORY — DX: Nocturia: R35.1

## 2014-02-15 HISTORY — DX: Bladder disorder, unspecified: N32.9

## 2014-02-15 HISTORY — DX: Frequency of micturition: R35.0

## 2014-02-15 HISTORY — PX: CYSTOSCOPY WITH HYDRODISTENSION AND BIOPSY: SHX5127

## 2014-02-15 HISTORY — DX: Urgency of urination: R39.15

## 2014-02-15 HISTORY — DX: Other seasonal allergic rhinitis: J30.2

## 2014-02-15 HISTORY — DX: Prediabetes: R73.03

## 2014-02-15 HISTORY — DX: Personal history of other infectious and parasitic diseases: Z86.19

## 2014-02-15 LAB — POCT I-STAT 4, (NA,K, GLUC, HGB,HCT)
GLUCOSE: 98 mg/dL (ref 70–99)
HCT: 41 % (ref 36.0–46.0)
Hemoglobin: 13.9 g/dL (ref 12.0–15.0)
POTASSIUM: 3.8 meq/L (ref 3.7–5.3)
Sodium: 141 mEq/L (ref 137–147)

## 2014-02-15 SURGERY — CYSTOSCOPY, WITH BLADDER HYDRODISTENSION AND BIOPSY
Anesthesia: General | Site: Bladder | Laterality: Bilateral

## 2014-02-15 MED ORDER — LIDOCAINE HCL 2 % EX GEL
CUTANEOUS | Status: DC | PRN
  Administered 2014-02-15: 1 via URETHRAL

## 2014-02-15 MED ORDER — KETOROLAC TROMETHAMINE 30 MG/ML IJ SOLN
INTRAMUSCULAR | Status: DC | PRN
  Administered 2014-02-15: 30 mg via INTRAVENOUS

## 2014-02-15 MED ORDER — BELLADONNA ALKALOIDS-OPIUM 16.2-60 MG RE SUPP
RECTAL | Status: AC
  Filled 2014-02-15: qty 1

## 2014-02-15 MED ORDER — PHENAZOPYRIDINE HCL 200 MG PO TABS: 200.0000 mg | ORAL_TABLET | Freq: Three times a day (TID) | ORAL | Status: DC | PRN

## 2014-02-15 MED ORDER — LIDOCAINE HCL (CARDIAC) 20 MG/ML IV SOLN
INTRAVENOUS | Status: DC | PRN
  Administered 2014-02-15: 80 mg via INTRAVENOUS

## 2014-02-15 MED ORDER — ACETAMINOPHEN-CODEINE #3 300-30 MG PO TABS: 1.0000 | ORAL_TABLET | ORAL | Status: DC | PRN

## 2014-02-15 MED ORDER — FESOTERODINE FUMARATE ER 8 MG PO TB24: 8.0000 mg | ORAL_TABLET | Freq: Every day | ORAL | Status: DC

## 2014-02-15 MED ORDER — MIDAZOLAM HCL 2 MG/2ML IJ SOLN
INTRAMUSCULAR | Status: AC
  Filled 2014-02-15: qty 2

## 2014-02-15 MED ORDER — OXYCODONE HCL 5 MG PO TABS
5.0000 mg | ORAL_TABLET | Freq: Once | ORAL | Status: DC | PRN
  Filled 2014-02-15: qty 1

## 2014-02-15 MED ORDER — LACTATED RINGERS IV SOLN
INTRAVENOUS | Status: DC
  Administered 2014-02-15: 07:00:00 via INTRAVENOUS
  Filled 2014-02-15: qty 1000

## 2014-02-15 MED ORDER — IOHEXOL 350 MG/ML SOLN
INTRAVENOUS | Status: DC | PRN
  Administered 2014-02-15: 10 mL

## 2014-02-15 MED ORDER — DEXAMETHASONE SODIUM PHOSPHATE 4 MG/ML IJ SOLN
INTRAMUSCULAR | Status: DC | PRN
  Administered 2014-02-15: 10 mg via INTRAVENOUS

## 2014-02-15 MED ORDER — FENTANYL CITRATE 0.05 MG/ML IJ SOLN
INTRAMUSCULAR | Status: AC
  Filled 2014-02-15: qty 2

## 2014-02-15 MED ORDER — HYDROMORPHONE HCL PF 1 MG/ML IJ SOLN
0.2500 mg | INTRAMUSCULAR | Status: DC | PRN
  Administered 2014-02-15: 0.5 mg via INTRAVENOUS
  Filled 2014-02-15: qty 1

## 2014-02-15 MED ORDER — ONDANSETRON HCL 4 MG/2ML IJ SOLN
INTRAMUSCULAR | Status: DC | PRN
  Administered 2014-02-15: 4 mg via INTRAVENOUS

## 2014-02-15 MED ORDER — DOCUSATE SODIUM 100 MG PO CAPS: 100.0000 mg | ORAL_CAPSULE | Freq: Two times a day (BID) | ORAL | Status: DC | PRN

## 2014-02-15 MED ORDER — PHENAZOPYRIDINE HCL 200 MG PO TABS
ORAL | Status: DC | PRN
  Administered 2014-02-15: 08:00:00 via INTRAVESICAL

## 2014-02-15 MED ORDER — MIDAZOLAM HCL 5 MG/5ML IJ SOLN
INTRAMUSCULAR | Status: DC | PRN
  Administered 2014-02-15: 2 mg via INTRAVENOUS

## 2014-02-15 MED ORDER — CIPROFLOXACIN IN D5W 400 MG/200ML IV SOLN
400.0000 mg | INTRAVENOUS | Status: AC
  Administered 2014-02-15: 400 mg via INTRAVENOUS
  Filled 2014-02-15: qty 200

## 2014-02-15 MED ORDER — PHENAZOPYRIDINE HCL 100 MG PO TABS
ORAL_TABLET | ORAL | Status: AC
  Filled 2014-02-15: qty 1

## 2014-02-15 MED ORDER — PHENAZOPYRIDINE HCL 200 MG PO TABS
200.0000 mg | ORAL_TABLET | Freq: Once | ORAL | Status: AC
  Administered 2014-02-15: 200 mg via ORAL
  Filled 2014-02-15: qty 1

## 2014-02-15 MED ORDER — HYDROMORPHONE HCL PF 1 MG/ML IJ SOLN
INTRAMUSCULAR | Status: AC
  Filled 2014-02-15: qty 1

## 2014-02-15 MED ORDER — BELLADONNA ALKALOIDS-OPIUM 16.2-60 MG RE SUPP
RECTAL | Status: DC | PRN
  Administered 2014-02-15: 1 via RECTAL

## 2014-02-15 MED ORDER — STERILE WATER FOR IRRIGATION IR SOLN
Status: DC | PRN
  Administered 2014-02-15: 3000 mL

## 2014-02-15 MED ORDER — FENTANYL CITRATE 0.05 MG/ML IJ SOLN
INTRAMUSCULAR | Status: DC | PRN
  Administered 2014-02-15: 50 ug via INTRAVENOUS

## 2014-02-15 MED ORDER — OXYCODONE HCL 5 MG/5ML PO SOLN
5.0000 mg | Freq: Once | ORAL | Status: DC | PRN
  Filled 2014-02-15: qty 5

## 2014-02-15 MED ORDER — PROMETHAZINE HCL 25 MG/ML IJ SOLN
6.2500 mg | INTRAMUSCULAR | Status: DC | PRN
Start: 2014-02-15 — End: 2014-02-15
  Filled 2014-02-15: qty 1

## 2014-02-15 MED ORDER — PROPOFOL 10 MG/ML IV BOLUS
INTRAVENOUS | Status: DC | PRN
  Administered 2014-02-15: 150 mg via INTRAVENOUS

## 2014-02-15 SURGICAL SUPPLY — 45 items
ADAPTER CATH URET PLST 4-6FR (CATHETERS) IMPLANT
ADPR CATH URET STRL DISP 4-6FR (CATHETERS)
BAG DRAIN URO-CYSTO SKYTR STRL (DRAIN) ×2 IMPLANT
BAG DRN UROCATH (DRAIN) ×1
BASKET LASER NITINOL 1.9FR (BASKET) IMPLANT
BASKET STNLS GEMINI 4WIRE 3FR (BASKET) IMPLANT
BASKET ZERO TIP NITINOL 2.4FR (BASKET) IMPLANT
BSKT STON RTRVL 120 1.9FR (BASKET)
BSKT STON RTRVL GEM 120X11 3FR (BASKET)
BSKT STON RTRVL ZERO TP 2.4FR (BASKET)
CANISTER SUCT LVC 12 LTR MEDI- (MISCELLANEOUS) ×2 IMPLANT
CATH INTERMIT  6FR 70CM (CATHETERS) IMPLANT
CATH ROBINSON RED A/P 16FR (CATHETERS) ×1 IMPLANT
CATH URET 5FR 28IN CONE TIP (BALLOONS)
CATH URET 5FR 28IN OPEN ENDED (CATHETERS) ×2 IMPLANT
CATH URET 5FR 70CM CONE TIP (BALLOONS) IMPLANT
CATH URET DUAL LUMEN 6-10FR 50 (CATHETERS) IMPLANT
CLOTH BEACON ORANGE TIMEOUT ST (SAFETY) ×2 IMPLANT
DRAPE CAMERA CLOSED 9X96 (DRAPES) ×2 IMPLANT
DRSG TEGADERM 2-3/8X2-3/4 SM (GAUZE/BANDAGES/DRESSINGS) IMPLANT
FIBER LASER FLEXIVA 200 (UROLOGICAL SUPPLIES) IMPLANT
FIBER LASER FLEXIVA 365 (UROLOGICAL SUPPLIES) IMPLANT
GLOVE BIO SURGEON STRL SZ7.5 (GLOVE) ×2 IMPLANT
GLOVE BIOGEL M STER SZ 6 (GLOVE) ×1 IMPLANT
GLOVE BIOGEL PI IND STRL 6.5 (GLOVE) IMPLANT
GLOVE BIOGEL PI INDICATOR 6.5 (GLOVE) ×2
GOWN STRL REIN XL XLG (GOWN DISPOSABLE) ×1 IMPLANT
GOWN STRL REUS W/TWL XL LVL3 (GOWN DISPOSABLE) ×2 IMPLANT
GUIDEWIRE 0.038 PTFE COATED (WIRE) IMPLANT
GUIDEWIRE ANG ZIPWIRE 038X150 (WIRE) IMPLANT
GUIDEWIRE STR DUAL SENSOR (WIRE) ×2 IMPLANT
IV NS IRRIG 3000ML ARTHROMATIC (IV SOLUTION) ×2 IMPLANT
KIT BALLIN UROMAX 15FX10 (LABEL) IMPLANT
KIT BALLN UROMAX 15FX4 (MISCELLANEOUS) IMPLANT
KIT BALLN UROMAX 26 75X4 (MISCELLANEOUS)
NDL SAFETY ECLIPSE 18X1.5 (NEEDLE) IMPLANT
NEEDLE HYPO 18GX1.5 SHARP (NEEDLE) ×2
NS IRRIG 500ML POUR BTL (IV SOLUTION) IMPLANT
PACK CYSTOSCOPY (CUSTOM PROCEDURE TRAY) ×2 IMPLANT
SET HIGH PRES BAL DIL (LABEL)
SHEATH ACCESS URETERAL 38CM (SHEATH) IMPLANT
SHEATH ACCESS URETERAL 54CM (SHEATH) IMPLANT
SYR 20CC LL (SYRINGE) ×1 IMPLANT
TUBE FEEDING 8FR 16IN STR KANG (MISCELLANEOUS) IMPLANT
WATER STERILE IRR 3000ML UROMA (IV SOLUTION) ×2 IMPLANT

## 2014-02-15 NOTE — Discharge Instructions (Signed)
Post Anesthesia Home Care Instructions  Activity: Get plenty of rest for the remainder of the day. A responsible adult should stay with you for 24 hours following the procedure.  For the next 24 hours, DO NOT: -Drive a car -Paediatric nurse -Drink alcoholic beverages -Take any medication unless instructed by your physician -Make any legal decisions or sign important papers.  Meals: Start with liquid foods such as gelatin or soup. Progress to regular foods as tolerated. Avoid greasy, spicy, heavy foods. If nausea and/or vomiting occur, drink only clear liquids until the nausea and/or vomiting subsides. Call your physician if vomiting continues.  Special Instructions/Symptoms: Your throat may feel dry or sore from the anesthesia or the breathing tube placed in your throat during surgery. If this causes discomfort, gargle with warm salt water. The discomfort should disappear within 24 hours.  CYSTOSCOPY HOME CARE INSTRUCTIONS  Activity: Rest for the remainder of the day.  Do not drive or operate equipment today.  You may resume normal activities in one to two days as instructed by your physician.   Meals: Drink plenty of liquids and eat light foods such as gelatin or soup this evening.  You may return to a normal meal plan tomorrow.  Return to Work: You may return to work in one to two days or as instructed by your physician.  Special Instructions / Symptoms: Call your physician if any of these symptoms occur:   -persistent or heavy bleeding  -bleeding which continues after first few urination  -large blood clots that are difficult to pass  -urine stream diminishes or stops completely  -fever equal to or higher than 101 degrees Farenheit.  -cloudy urine with a strong, foul odor  -severe pain  Females should always wipe from front to back after elimination.  You may feel some burning pain when you urinate.  This should disappear with time.  Applying moist heat to the lower  abdomen or a hot tub bath may help relieve the pain. \  Follow-Up / Date of Return Visit to Your Physician:  *** Call for an appointment to arrange follow-up.  Patient Signature:  ________________________________________________________  Nurse's Signature:  ________________________________________________________  Bladder Distention Instructions   General instructions: Your recent bladder surgery requires very little post hospital care but some definite precautions.   Diet:  You may return to your normal diet immediately. Because of the raw surface of your bladder, alcohol, spicy foods, foods high in acid and drinks with caffeine may cause irritation or frequency and should be used in moderation. To keep your urine flowing freely and avoid constipation, drink plenty of fluids during the day (8-10 glasses). Tip: Avoid cranberry juice because it is very acidic.  Activity:  Your physical activity doesn't need to be restricted. However, if you are very active, you may see some blood in the urine. We suggest that you reduce your activity under the circumstances until the bleeding has stopped.  Bowels:  It is important to keep your bowels regular during the postoperative period. Straining with bowel movements can cause bleeding. A bowel movement every other day is reasonable. Use a mild laxative if needed, such as milk of magnesia 2-3 tablespoons, or 2 Dulcolax tablets. Call if you continue to have problems. If you had been taking narcotics for pain, before, during or after your surgery, you may be constipated. Take a laxative if necessary.    Medication:  You should resume your pre-surgery medications unless told not to. In addition you may be given  an antibiotic to prevent or treat infection. Antibiotics are not always necessary. All medication should be taken as prescribed until the bottles are finished unless you are having an unusual reaction to one of the drugs.   Toviaz for  bladder overactivity Pyridium for burning with urination Tylenol with Codeine for bladder pain

## 2014-02-15 NOTE — Anesthesia Preprocedure Evaluation (Signed)
Anesthesia Evaluation  Patient identified by MRN, date of birth, ID band Patient awake    Reviewed: Allergy & Precautions, H&P , NPO status , Patient's Chart, lab work & pertinent test results  Airway Mallampati: II TM Distance: >3 FB Neck ROM: Full    Dental  (+) Dental Advisory Given   Pulmonary neg pulmonary ROS, former smoker,  breath sounds clear to auscultation        Cardiovascular negative cardio ROS  Rhythm:Regular Rate:Normal     Neuro/Psych  Headaches, PSYCHIATRIC DISORDERS Depression    GI/Hepatic negative GI ROS, Neg liver ROS,   Endo/Other  negative endocrine ROS  Renal/GU negative Renal ROS     Musculoskeletal negative musculoskeletal ROS (+)   Abdominal   Peds  Hematology negative hematology ROS (+) anemia ,   Anesthesia Other Findings   Reproductive/Obstetrics negative OB ROS                           Anesthesia Physical Anesthesia Plan  ASA: II  Anesthesia Plan: General   Post-op Pain Management:    Induction: Intravenous  Airway Management Planned: LMA  Additional Equipment:   Intra-op Plan:   Post-operative Plan: Extubation in OR  Informed Consent: I have reviewed the patients History and Physical, chart, labs and discussed the procedure including the risks, benefits and alternatives for the proposed anesthesia with the patient or authorized representative who has indicated his/her understanding and acceptance.   Dental advisory given  Plan Discussed with: CRNA  Anesthesia Plan Comments:         Anesthesia Quick Evaluation

## 2014-02-15 NOTE — Anesthesia Postprocedure Evaluation (Signed)
Anesthesia Post Note  Patient: Stacy Logan  Procedure(s) Performed: Procedure(s) (LRB): CYSTOSCOPY  BILATERAL RETROGRADE PYLOGRAM, HYDRODISTENSION, INSTILATION OF MARCAINE AND PYRIDIUM (Bilateral)  Anesthesia type: General  Patient location: PACU  Post pain: Pain level controlled  Post assessment: Post-op Vital signs reviewed  Last Vitals: BP 113/76  Pulse 74  Temp(Src) 36.2 C (Oral)  Resp 18  Ht 5\' 11"  (1.803 m)  Wt 117 lb 8 oz (53.298 kg)  BMI 16.40 kg/m2  SpO2 98%  Post vital signs: Reviewed  Level of consciousness: sedated  Complications: No apparent anesthesia complications

## 2014-02-15 NOTE — Anesthesia Procedure Notes (Signed)
Procedure Name: LMA Insertion Date/Time: 02/15/2014 7:34 AM Performed by: Bethena Roys T Pre-anesthesia Checklist: Patient identified, Emergency Drugs available, Suction available and Patient being monitored Patient Re-evaluated:Patient Re-evaluated prior to inductionOxygen Delivery Method: Circle System Utilized Preoxygenation: Pre-oxygenation with 100% oxygen Intubation Type: IV induction Ventilation: Mask ventilation without difficulty LMA: LMA inserted LMA Size: 4.0 Number of attempts: 1 Airway Equipment and Method: bite block Placement Confirmation: positive ETCO2 Dental Injury: Teeth and Oropharynx as per pre-operative assessment

## 2014-02-15 NOTE — Transfer of Care (Signed)
Immediate Anesthesia Transfer of Care Note  Patient: Stacy Logan  Procedure(s) Performed: Procedure(s): CYSTOSCOPY  BILATERAL RETROGRADE PYLOGRAM, HYDRODISTENSION, INSTILATION OF MARCAINE AND PYRIDIUM (Bilateral)  Patient Location: PACU  Anesthesia Type:General  Level of Consciousness: awake and oriented  Airway & Oxygen Therapy: Patient Spontanous Breathing and Patient connected to nasal cannula oxygen  Post-op Assessment: Report given to PACU RN  Post vital signs: Reviewed and stable  Complications: No apparent anesthesia complications

## 2014-02-15 NOTE — H&P (Signed)
Reason For Visit Pelvic pain   History of Present Illness 48 year old female referred by Dr. Annye Asa, M.D. for evaluation and management of chronic pelvic pain.   Patient's symptoms going on for the past 5 years. She reports intense burning with urination. The most recent urinalysis was not suspicious for evidence of infection.  The patient predominantly complains of urinary frequency and bladder pain. The pain is most intense when she has a full bladder. She voids every 15-20 minutes. She complains of dysuria. She feels that she doesn't completely empty her bladder. She complains of severe urge and associated urge incontinence. She typically leaks at least twice a day. She denies any symptoms of stress urinary incontinence. Patient has a strong stream does not have to strain to void, does not have postvoid dribbling, and does not have to posture in order to complete her void. She denies any gross hematuria. She has no history of kidney stones. She has had urinary tract infections in the past, last infection was approximately 6 years ago and she was treated for pyelonephritis at that time. The patient denies any constipation or bowel dysfunction. 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 She has numbness in her fingers and tingling. She is not sure if this is from her MS or from a herniated disc in her neck. She also has difficulty hearing in her left ear. The patient's sister also has been diagnosed with hematuria although no additional information is available.  The patient has a history of being severely depressed.   Past Medical History Problems  1. History of arthritis (V13.4)  Surgical History Problems  1. History of Appendectomy 2. History of Cesarean Section  Current Meds 1. Azo-Cranberry TABS;  Therapy: (Recorded:12May2015) to Recorded 2. Valtrex 1 GM Oral Tablet;  Therapy: (Recorded:12May2015) to Recorded  Allergies Medication  1. Demerol  SOLN 2. Morphine Derivatives  Family History Problems  1. Family history of lung cancer (V16.1) : Father 2. Family history of malignant neoplasm of breast (V16.3) : Sister, Aunt 3. Family history of ovarian cancer (V16.41) : Aunt 4. Family history of prostate cancer (V16.42) : Grandfather  Social History Problems    Denied: History of Alcohol use   Caffeine use (V49.89)   Death in the family, father   age 8   Divorced   Never a smoker   Self-employed  Review of Systems  Genitourinary: urinary frequency, urinary urgency, dysuria and nocturia.  Gastrointestinal: nausea.  Constitutional: night sweats, feeling tired (fatigue) and recent weight loss.  ENT: sinus problems.  Hematologic/Lymphatic: a tendency to easily bruise.  Musculoskeletal: back pain and joint pain.  Neurological: headache.    Vitals Vital Signs [Data Includes: Last 1 Day]  Recorded: [id number] 01:50PM  Height: 5 ft 11.5 in Weight: 123 lb 4.8 oz BMI Calculated: 16.96 BSA Calculated: 1.73 Blood Pressure: 121 / 70 Temperature: 97.8 F Heart Rate: 83  Physical Exam Constitutional: Well nourished and well developed . No acute distress.  ENT:. The ears and nose are normal in appearance.  Neck: The appearance of the neck is normal and no neck mass is present.  Cardiovascular: Heart rate and rhythm are normal . No peripheral edema.  Abdomen: The abdomen is soft and nontender. Suprapubic tenderness is present. No CVA tenderness. No hepatosplenomegaly noted.  Genitourinary:. Tight vaginal introitus with bilateral levator ani tenderness, anterior vaginal wall tenderness/urethral tenderness and bladder tenderness.  Chaperone Present: Estill Bamberg.  Examination of the external genitalia shows normal female external genitalia. The urethra  is normal in appearance. Vaginal exam demonstrates tenderness, but the vaginal epithelium to be well estrogenized and no uterine prolapse. No cystocele is identified. No enterocele  is identified. No rectocele is identified. The bladder is tender. The anus is normal on inspection.  Lymphatics: The femoral and inguinal nodes are not enlarged or tender.  Skin: Normal skin turgor, no visible rash and no visible skin lesions.  Neuro/Psych:. Mood and affect are appropriate.    Results/Data Urine [Data Includes: Last 1 Day]   [id number]  COLOR YELLOW   APPEARANCE CLEAR   SPECIFIC GRAVITY 1.030   pH 5.5   GLUCOSE NEG mg/dL  BILIRUBIN SMALL   KETONE NEG mg/dL  BLOOD MOD   PROTEIN NEG mg/dL  UROBILINOGEN 0.2 mg/dL  NITRITE NEG   LEUKOCYTE ESTERASE NEG   SQUAMOUS EPITHELIAL/HPF FEW   WBC 0-2 WBC/hpf  RBC 3-6 RBC/hpf  BACTERIA RARE   CRYSTALS NONE SEEN   CASTS NONE SEEN   Other MUCUS NOTED    Procedure  Procedure: Cystoscopy  Chaperone Present: Estill Bamberg.  Indication: Hematuria. Lower Urinary Tract Symptoms.  Informed Consent:. Specific risks including, but not limited to bleeding, infection, pain, allergic reaction etc. were explained.  Prep: The patient was prepped with hibiclens.  Anesthesia:. Local anesthesia was administered intraurethrally with 2% lidocaine jelly.  Antibiotic prophylaxis: Ciprofloxacin.  Procedure Note:  Urethral meatus:. No abnormalities.  Bladder: Severely contracted bladder ~ 50cc. Small erythematous area on posterior bladder wall concerning for CIS. Visulization was obscured due to cloudy urine. Examination of the bladder demonstrated erythematous mucosa. A saline bladder washing was obtained and sent for cytologic analysis. The patient tolerated the procedure well.  Complications: None.    Assessment Assessed  1. Pelvic pain in female (625.9) 2. Overactive bladder (596.51) 3. Microscopic hematuria (599.72)  Patient with a severely contracted, noncompliant small capacity bladder (approximately 50 cc) with a abnormal cystoscopic finding something of a flat lesion on the posterior aspect of the patient's bladder concerning for CIS versus  severe interstitial cystitis.  Patient with significant pelvic pain and muscular tenderness.  Overactive bladder symptoms   Plan Health Maintenance  1. UA With REFLEX; [Do Not Release]; Status:Complete;   Done: [id number] 01:16PM Pelvic pain in female  2. Start: Amitriptyline HCl - 25 MG Oral Tablet; TAKE 1 TABLET DAILY AT BEDTIME 3. Start: Diazepam 10 MG Oral Tablet; TAKE 1 TABLET Daily insert into Vagina prior to  bedtime 4. Start: Myrbetriq 50 MG Oral Tablet Extended Release 24 Hour; Take 1 tablet daily 5. Start: Uribel 118 MG Oral Capsule; TAKE 1 CAPSULE 3 times daily 6. Follow-up Schedule Surgery Office  Follow-up  Status: Complete  Done: [id number] 7. PT/OT Referral Referral  Referral  Status: Hold For - PreCert,Date of Service,Physical  Therapy  Requested for: 14May2015 8. URINE CULTURE; Status:In Progress - Specimen/Data Collected;   Done: [id number] 9. URINE CYTOLOGY; Status:In Progress - Specimen/Data Collected;   Done: [id number]  Discussion/Summary I discussed the findings with the patient in detail. I recommended a exam under anesthesia with the bladder biopsy, which we will get scheduled ASAP. I've also sent a urine cytology today. The patient may ultimately require urodynamics to evaluate her compliance and Intravesicle pressure.  To complete the patient's hematuria evaluation she will require a renal ultrasound. We'll get this ordered as soon as possible.  I've given the patient a month sample of myrbetriq 50 mg for overactive bladder symptoms, I also gone over the interstitial cystitis diet recommendations.  For the patient's dysuria, I  have given her Uribel samples as well as a month's prescription.  For the patient's pelvic pain I started the patient on amitriptyline, 10 mg of intravaginal Valium daily, and pelvic floor physical therapy.

## 2014-02-15 NOTE — Op Note (Signed)
Preoperative diagnosis:  1. Pelvic floor dysfunction 2. Overactive bladder 3. Chronic pelvic pain syndrome  4. Question of bladder lesion 5. Microscopic hematuria  Postoperative diagnosis:  1. Pelvic floor dysfunction  2. Overactive bladder 3. Chronic pelvic pain syndrome 4. Microscopic hematuria   Procedure: 1. Cystoscopy 2. Bilateral retrograde pyelogram with interpretation 3. Cystoscopic Hydrodistention 4. Exam under anesthesia  Surgeon: Ardis Hughs, MD  Anesthesia: General  Complications: None  Intraoperative findings: there was no definable bladder lesion within the bladder, the retrograde pyelograms were performed bilaterally revealing normal caliber ureter with a normal appearing renal pelvis and calyces bilaterally, the bladder capacity was approximated 800 cc, there were no urine or ulcers or significant bleeding following distention.  EBL: Minimal  Specimens: None  Indication: Stacy Logan is a 48 y.o. patient with chronic pelvic pain, overactive bladder, and microscopic hematuria.  After reviewing the management options for treatment, he elected to proceed with the above surgical procedure(s). We have discussed the potential benefits and risks of the procedure, side effects of the proposed treatment, the likelihood of the patient achieving the goals of the procedure, and any potential problems that might occur during the procedure or recuperation. Informed consent has been obtained.  Description of procedure:  The patient was taken to the operating room and general anesthesia was induced.  The patient was placed in the dorsal lithotomy position, prepped and draped in the usual sterile fashion, and preoperative antibiotics were administered. A preoperative time-out was performed.   The 30 53 French rigid cystoscope was gently passed to the patient's urethra and into the bladder. This or degree lenses and exchanged for the 70 lens and a drain and 360  cystoscopic evaluation was performed. The above findings were noted. The 70 lens was then exchanged for the 30 lens and bilateral retrograde fibers were performed bilaterally in the routine fashion. The above findings were noted. The bladder was then hydrodistended by hanging the irrigation fluid at 100 cm above the patient and filling the bladder to gravity. The total bladder capacity was approximately 800 cc. The bladder was left distended for proximally 5 minutes and then drained. He was then filled up again in a similar fashion. There was no significant bleeding or hematuria. The bladder was then emptied and using a 16 French red rubber catheter 400 mg of Pyridium mixed with 15 mL's of Marcaine quarter percent were instilled into the patient's bladder. The red rubber catheter was then removed. An exam under anesthesia was then performed in a bimanual exam revealed no masses, a mobile bladder, normal feeling cervix which was freely mobile, and no palpable ovarian pathology.  The patient was subsequently awoken and returned the PACU in stable condition. There no immediate complications.  Disposition: Patient be discharged home with followup scheduled in 2 weeks.  Ardis Hughs, M.D.

## 2014-02-18 ENCOUNTER — Encounter (HOSPITAL_BASED_OUTPATIENT_CLINIC_OR_DEPARTMENT_OTHER): Payer: Self-pay | Admitting: Urology

## 2014-04-11 ENCOUNTER — Ambulatory Visit (INDEPENDENT_AMBULATORY_CARE_PROVIDER_SITE_OTHER): Payer: 59 | Admitting: Family Medicine

## 2014-04-11 ENCOUNTER — Encounter: Payer: Self-pay | Admitting: Family Medicine

## 2014-04-11 VITALS — BP 110/80 | HR 90 | Temp 98.2°F | Resp 16 | Ht 70.0 in | Wt 114.1 lb

## 2014-04-11 DIAGNOSIS — F418 Other specified anxiety disorders: Secondary | ICD-10-CM

## 2014-04-11 DIAGNOSIS — G35D Multiple sclerosis, unspecified: Secondary | ICD-10-CM

## 2014-04-11 DIAGNOSIS — R634 Abnormal weight loss: Secondary | ICD-10-CM

## 2014-04-11 DIAGNOSIS — G35 Multiple sclerosis: Secondary | ICD-10-CM

## 2014-04-11 DIAGNOSIS — R52 Pain, unspecified: Secondary | ICD-10-CM | POA: Insufficient documentation

## 2014-04-11 DIAGNOSIS — F341 Dysthymic disorder: Secondary | ICD-10-CM

## 2014-04-11 MED ORDER — DULOXETINE HCL 20 MG PO CPEP: 20.0000 mg | ORAL_CAPSULE | Freq: Every day | ORAL | Status: DC

## 2014-04-11 NOTE — Progress Notes (Signed)
   Subjective:    Patient ID: Stacy Logan, female    DOB: 1966-08-28, 48 y.o.   MRN: [id number]  HPI New to establish.  No previous PCP w/in 5 yrs  Depression/anxiety- chronic problem, now on Amitriptyline after work and prior to bed.  Has intravaginal Valium to use nightly to relax pelvic floor.  Weight loss- Pt is 'getting better at eating.  I'm forcing it down whether I want it or not'.  Pt continues to have decreased appetite.  Has tried Ensure, protein shakes, milk shakes.  Pt is embarrassed by thin frame  MS- pt reports she was dx'd w/ MS 15 yrs ago when she was told 'MS plaque is on my brain'.  Saw Neuro years ago.  Pt will have intermittent weakness in arms/legs.  Some neuropathy in toes and ulnar distribution bilaterally.  Will have falls.   Review of Systems For ROS see HPI     Objective:   Physical Exam  Vitals reviewed. Constitutional: She is oriented to person, place, and time. No distress.  Very thin, almost cachectic  HENT:  Head: Normocephalic and atraumatic.  Neck: Neck supple. No thyromegaly present.  Cardiovascular: Normal rate, regular rhythm and normal heart sounds.   Pulmonary/Chest: Effort normal and breath sounds normal. No respiratory distress. She has no wheezes. She has no rales.  Musculoskeletal: She exhibits no edema.  Lymphadenopathy:    She has no cervical adenopathy.  Neurological: She is alert and oriented to person, place, and time. No cranial nerve deficit. Coordination normal.  Hyperreflexic patellar and brachial reflexes  Skin: Skin is warm and dry.  Psychiatric: She has a normal mood and affect. Her behavior is normal. Thought content normal.          Assessment & Plan:

## 2014-04-11 NOTE — Progress Notes (Signed)
Pre visit review using our clinic review tool, if applicable. No additional management support is needed unless otherwise documented below in the visit note. 

## 2014-04-11 NOTE — Patient Instructions (Signed)
Follow up in 6 weeks to recheck mood Start the Cymbalta daily- this is a VERY low dose and we may need to go up on this We'll call you with your Neuro appt We'll call you with your Nutrition appt Make sure you are eating regularly, add protein shakes multiple times daily Call with any questions or concerns Enjoy the rest of your summer!

## 2014-04-14 NOTE — Assessment & Plan Note (Signed)
Pt continues to lose weight despite what she reports are efforts to gain.  Is starting to eat more regularly, has attempted to add protein shakes.  Will refer to nutrition and see if we can stabilize her sxs.  Will follow closely.

## 2014-04-14 NOTE — Assessment & Plan Note (Addendum)
New.  Unclear if pt has fibromyalgia or if this could possibly be related to her MS dx (per pt report).  Start Cymbalta- titrate prn.  Refer to neuro.

## 2014-04-14 NOTE — Assessment & Plan Note (Signed)
New.  Pt reports she was told she had MS by previous neuro years ago but has not received any treatment nor had subsequent neuro appts.  Will refer to neuro to confirm dx and start tx prn.  Pt expressed understanding and is in agreement w/ plan.

## 2014-04-14 NOTE — Assessment & Plan Note (Addendum)
Ongoing problem.  Due to pt's depression and wide spread pain, will start Cymbalta and monitor for improvement.  Pt expressed understanding and is in agreement w/ plan.

## 2014-04-22 ENCOUNTER — Telehealth: Payer: Self-pay | Admitting: Family Medicine

## 2014-04-22 NOTE — Telephone Encounter (Signed)
Caller name: Keelia  Call back number:[phone]   Reason for call:  Pt thinks she is having reactions to medication DULoxetine (CYMBALTA) 20 MG .  Please contact to advise

## 2014-04-22 NOTE — Telephone Encounter (Signed)
Please triage pt

## 2014-04-22 NOTE — Telephone Encounter (Signed)
-----   Message from Mackie Pai, PA-C sent at 04/22/2014 4:47 PM ----- Pt needs to be seen. Hold med today if she thinks side effect.But stress that she come in. If symptoms worsen UC or ED.  Edwards recommendations were discussed with patient.  Appt scheduled with Percell Miller tomorrow at 2 pm.

## 2014-04-22 NOTE — Telephone Encounter (Signed)
Pt states that she's ill "as a damn hornet" and very anxious.  Numbness in her extremities--from elbows to finger tips and from knees to toes.  Dizzy.  Blurred vision.  Feels tired.  Legs feel heavy.  Fell today, yesterday, and one day last week.  Pt thinks that it may be due to the Cymbalta.  Started Cymbalta two weeks ago.  Symptoms started last Tuesday.    Hx. MS    Please advise.

## 2014-04-23 ENCOUNTER — Ambulatory Visit (INDEPENDENT_AMBULATORY_CARE_PROVIDER_SITE_OTHER): Payer: 59 | Admitting: Medical

## 2014-04-23 ENCOUNTER — Encounter: Payer: Self-pay | Admitting: Medical

## 2014-04-23 ENCOUNTER — Encounter: Payer: Self-pay | Admitting: Gastroenterology

## 2014-04-23 VITALS — BP 102/73 | HR 109 | Temp 99.2°F | Resp 16 | Wt 112.2 lb

## 2014-04-23 DIAGNOSIS — R7309 Other abnormal glucose: Secondary | ICD-10-CM

## 2014-04-23 DIAGNOSIS — R634 Abnormal weight loss: Secondary | ICD-10-CM

## 2014-04-23 DIAGNOSIS — G43709 Chronic migraine without aura, not intractable, without status migrainosus: Secondary | ICD-10-CM | POA: Insufficient documentation

## 2014-04-23 DIAGNOSIS — R51 Headache: Secondary | ICD-10-CM

## 2014-04-23 DIAGNOSIS — IMO0002 Reserved for concepts with insufficient information to code with codable children: Secondary | ICD-10-CM | POA: Insufficient documentation

## 2014-04-23 DIAGNOSIS — G35 Multiple sclerosis: Secondary | ICD-10-CM

## 2014-04-23 DIAGNOSIS — R739 Hyperglycemia, unspecified: Secondary | ICD-10-CM

## 2014-04-23 MED ORDER — KETOROLAC TROMETHAMINE 60 MG/2ML IM SOLN
60.0000 mg | Freq: Once | INTRAMUSCULAR | Status: AC
  Administered 2014-04-23: 60 mg via INTRAMUSCULAR

## 2014-04-23 MED ORDER — SUMATRIPTAN SUCCINATE 50 MG PO TABS: 50.0000 mg | ORAL_TABLET | ORAL | Status: DC | PRN

## 2014-04-23 NOTE — Assessment & Plan Note (Signed)
Pt to see neurologist early September.

## 2014-04-23 NOTE — Progress Notes (Signed)
   Subjective:    Patient ID: Stacy Logan, female    DOB: [birth year], 48 y.o.   MRN: [id number]  HPI  Pt in states that she has HA today. Frontal ha. Hx of migraine ha. Light sensitivity and sound sensitive. Last migraine one month ago. Uses advil otc or goes to dark room and sleeps ha off. No associated gross motor or sensory function deficits. No sinus infection symptoms.  Pt has balance issues on and off for couple of years. Last summer she fell a lot. Tingling sensation in arms and legs on and off. Hx of lesion on mri that in the past considered to be probable MS. But  No mri for 15 yrs. Pt fell yesterday but no head trauma.  Pt  Transient blurred vision but vision ok presently.   Weight loss 10 pound since April 22nd. But 30 pound over past 6 yrs.  Pt was wondering if maybe cymbalta maybe cymbalta caused tingling in arms and legs. But she has tingling for year before she was on cymbalta. No rash and no itching.   Review of Systems  Constitutional: Negative for fever, chills and fatigue.  Respiratory: Negative for choking, chest tightness, shortness of breath and wheezing.   Cardiovascular: Negative for chest pain and palpitations.  Neurological: Positive for dizziness, weakness and headaches. Negative for tremors, seizures, syncope, facial asymmetry, speech difficulty and numbness.       Transient dizzy on and off with tingling upper and lower ext. Feels weak at times. This is not new symptom has been going on for months.  Hematological: Negative for adenopathy. Does not bruise/bleed easily.  Psychiatric/Behavioral: Positive for dysphoric mood. Negative for confusion, sleep disturbance and agitation.       Hx of but stable recently.       Objective:   Physical Exam  Constitutional: She is oriented to person, place, and time. She appears well-developed and well-nourished. No distress.  HENT:  Head: Normocephalic and atraumatic.  Eyes: Conjunctivae and EOM are normal. Pupils are  equal, round, and reactive to light.  Neck: Normal range of motion. Neck supple. No JVD present. No tracheal deviation present. No thyromegaly present.  Cardiovascular: Normal rate, regular rhythm and normal heart sounds.  Exam reveals no gallop and no friction rub.   No murmur heard. Pulmonary/Chest: Effort normal and breath sounds normal. No stridor. No respiratory distress. She has no wheezes. She has no rales.  Abdominal: Soft. Bowel sounds are normal. She exhibits no distension and no mass. There is no tenderness. There is no rebound and no guarding.  Musculoskeletal: Normal range of motion. She exhibits no edema.  Lymphadenopathy:    She has no cervical adenopathy.  Neurological: She is alert and oriented to person, place, and time. No cranial nerve deficit.  Cn III-XII intact, neg romberg, finger to nose intact, heal to toe intact. 4/5 equal and symmetric strength upper and lower ext. No drift.  Skin: She is not diaphoretic.  Psychiatric: She has a normal mood and affect. Thought content normal.           Assessment & Plan:

## 2014-04-23 NOTE — Progress Notes (Signed)
Pre visit review using our clinic review tool, if applicable. No additional management support is needed unless otherwise documented below in the visit note. 

## 2014-04-23 NOTE — Assessment & Plan Note (Signed)
Toradol im. Imitrex rx as well. If ha worsens then ED evaluation. Otherwise follow up with neurologist.

## 2014-04-23 NOTE — Patient Instructions (Signed)
Please make sure you attend neurology referral at the beginning of September. We gave you toradol for migraine ha today. I am also sending imitrex to your pharmacy. I do want you to get labs today. I am also scheduling you to GI to do screening colonosocpy. Also follow up with your gynecologist for papsmear and to get mammogram.

## 2014-04-23 NOTE — Assessment & Plan Note (Signed)
Will get lipase today, hiv today and refer for screening colonoscopy. Pt will see neurologist for MS recheck.

## 2014-04-24 LAB — LIPASE: Lipase: 16 U/L (ref 11.0–59.0)

## 2014-04-24 LAB — HEMOGLOBIN A1C: Hgb A1c MFr Bld: 5.8 % (ref 4.6–6.5)

## 2014-04-24 LAB — HIV ANTIBODY (ROUTINE TESTING W REFLEX): HIV 1&2 Ab, 4th Generation: NONREACTIVE

## 2014-04-24 LAB — AMYLASE: Amylase: 28 U/L (ref 27–131)

## 2014-04-25 ENCOUNTER — Encounter: Payer: 59 | Attending: Family Medicine | Admitting: Dietician

## 2014-04-25 ENCOUNTER — Encounter: Payer: Self-pay | Admitting: Dietician

## 2014-04-25 VITALS — Ht 71.5 in | Wt 113.1 lb

## 2014-04-25 DIAGNOSIS — R634 Abnormal weight loss: Secondary | ICD-10-CM | POA: Diagnosis present

## 2014-04-25 DIAGNOSIS — Z713 Dietary counseling and surveillance: Secondary | ICD-10-CM | POA: Insufficient documentation

## 2014-04-25 NOTE — Patient Instructions (Addendum)
Have a Boost Plus (360 calorie) or Carnation instant breakfast with whole milk at your 11:00 meal. Aim to have 2-3 Boosts or Carnations each day. Try adding unflavored protein powder to coffee or yogurt or juice. If you like it, you can buy it online at the [facility] on [address].  Have full fat yogurt and ice cream.  Consider talking to a counselor or therapist to help deal with stress.  Be in touch with your doctor about recent falls.
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
## 2014-04-27 ENCOUNTER — Telehealth: Payer: Self-pay

## 2014-04-27 MED ORDER — DULOXETINE HCL 20 MG PO CPEP: 40.0000 mg | ORAL_CAPSULE | Freq: Every day | ORAL | Status: DC

## 2014-04-27 NOTE — Telephone Encounter (Signed)
Called and spoke with pt and pt is aware of Dr. Idelle Leech recommendations.  Pt states she needed a new rx.  New rx sent to [facility] for Cymbalta 20 mg- 2 tabs daily #60 with 1 rf.

## 2014-04-27 NOTE — Telephone Encounter (Signed)
Pt can be reached at [phone]

## 2014-04-27 NOTE — Telephone Encounter (Signed)
I reviewed Dr. Virgil Benedict most recent note on this pt. Ok to titrate cymbalta to TWO of the 20mg  tabs qd. If new rx needed, pls do eRx for cymbalta 20mg , 2 tabs po qd, #60, RF x 1. -thx

## 2014-04-27 NOTE — Telephone Encounter (Signed)
Pt's caregiver called and states that pt has increased numbness, tingling and weakness bilaterally.  Pt's left leg is worse and pt has fallen 10 times this week due to the weakness. Pt is currently on 10 mg of Cymbalta but would like to increase it to 40 mg.  Pls advise.

## 2014-05-02 ENCOUNTER — Ambulatory Visit (INDEPENDENT_AMBULATORY_CARE_PROVIDER_SITE_OTHER): Payer: 59 | Admitting: Gastroenterology

## 2014-05-02 ENCOUNTER — Encounter: Payer: Self-pay | Admitting: Gastroenterology

## 2014-05-02 VITALS — BP 100/60 | HR 60 | Ht 71.0 in | Wt 115.2 lb

## 2014-05-02 DIAGNOSIS — Z1211 Encounter for screening for malignant neoplasm of colon: Secondary | ICD-10-CM

## 2014-05-02 DIAGNOSIS — K5900 Constipation, unspecified: Secondary | ICD-10-CM

## 2014-05-02 MED ORDER — MOVIPREP 100 G PO SOLR: 1.0000 | Freq: Once | ORAL | Status: DC

## 2014-05-02 NOTE — Progress Notes (Addendum)
05/02/2014 Stacy Logan [id number] 1965-11-11   HISTORY OF PRESENT ILLNESS:  Patient is a pleasant 48 year old female who is new to our practice.  She was referred here by Dr. Birdie Riddle to discuss colonoscopy.  She has never undergone colonoscopy in the past.  She also has constipation.  She possibly has MS and is going to see neurology on 9/1.  Her constipation began this spring when she started some of her medications such as amitriptyline, valium, etc.  She has been taking colace 4 capsules daily, but still does not have a BM for a few days at times.  Sees occasional bright red blood if she strains to have a BM.  She reports 10 pound weight loss, but says that she has stopped drinking soda and eating a lot of sugar/sweets due to bladder issues.   Past Medical History  Diagnosis Date  . Arthritis   . Depression   . Migraines   . History of pneumothorax     1988-- SPONTANEOUS--  RESOLVED W/ CHEST TUBE  . Lesion of bladder   . H/O cold sores   . Borderline diabetes   . Frequency of urination   . Urgency of urination   . Nocturia   . Seasonal allergies   . Anal fissure   . Anxiety   . IC (interstitial cystitis)   . Pre-eclampsia   . MS (multiple sclerosis)     MRI showed plague on her brain   Past Surgical History  Procedure Laterality Date  . Tonsillectomy and adenoidectomy  1972  . D & c hysteroscopy with polypectomy  12-02-2003  . Cesarean section  1994  . Dx laparoscopy/  fulgeration endometriosis/  appendectomy  1985  . Cystoscopy with hydrodistension and biopsy Bilateral 02/15/2014    Procedure: CYSTOSCOPY  BILATERAL RETROGRADE PYLOGRAM, HYDRODISTENSION, INSTILATION OF MARCAINE AND PYRIDIUM;  Surgeon: Ardis Hughs, MD;  Location: [facility];  Service: Urology;  Laterality: Bilateral;    reports that she quit smoking about 3 years ago. Her smoking use included Cigarettes. She has a 10 pack-year smoking history. She has never used smokeless tobacco.  She reports that she does not drink alcohol or use illicit drugs. family history includes Cancer in her father; Colon cancer in her maternal uncle; Diabetes in her father, paternal aunt, paternal grandfather, paternal grandmother, and paternal uncle; Hypertension in her mother; Lung cancer in her father; Prostate cancer in her father. Allergies  Allergen Reactions  . Codeine     Makes her "busy", itchy  . Meperidine Hcl Nausea And Vomiting    SEVERE N/V  . Morphine Other (See Comments)    "SKIN CRAWLS"      Outpatient Encounter Prescriptions as of 05/02/2014  Medication Sig  . amitriptyline (ELAVIL) 25 MG tablet Take 25 mg by mouth at bedtime.  . diazepam (VALIUM) 10 MG tablet Place 10 mg vaginally at bedtime as needed for anxiety.  . docusate sodium (COLACE) 100 MG capsule Take 1 capsule (100 mg total) by mouth 2 (two) times daily as needed (take to keep stool soft.).  Marland Kitchen DULoxetine (CYMBALTA) 20 MG capsule Take 2 capsules (40 mg total) by mouth daily.  . fesoterodine (TOVIAZ) 8 MG TB24 tablet Take 1 tablet (8 mg total) by mouth daily.  . hydrOXYzine (VISTARIL) 25 MG capsule Take 25 mg by mouth at bedtime as needed.  . SUMAtriptan (IMITREX) 50 MG tablet Take 1 tablet (50 mg total) by mouth every 2 (two) hours  as needed for migraine or headache (max 2 tabs over 24 hour period.). May repeat in 2 hours if headache persists or recurs.  . valACYclovir (VALTREX) 1000 MG tablet Take 2,000 mg by mouth as directed. 2 grams Q12 x2 doses prn cold sores  . MOVIPREP 100 G SOLR Take 1 kit (200 g total) by mouth once.     REVIEW OF SYSTEMS  : All other systems reviewed and negative except where noted in the History of Present Illness.   PHYSICAL EXAM: BP 100/60  Pulse 60  Ht $R'5\' 11"'Yd$  (1.803 m)  Wt 115 lb 3.2 oz (52.254 kg)  BMI 16.07 kg/m2 General: Well developed white female in no acute distress Head: Normocephalic and atraumatic Eyes:  Sclerae anicteric, conjunctiva pink. Ears: Normal auditory  acuity  Lungs: Clear throughout to auscultation Heart: Regular rate and rhythm Abdomen: Soft, non-distended.  Normal bowel sounds.  Non-tender. Rectal:  Deferred.  Will be done at the time of colonoscopy. Musculoskeletal: Symmetrical with no gross deformities  Skin: No lesions on visible extremities Extremities: No edema  Neurological: Alert oriented x 4, grossly non-focal Psychological:  Alert and cooperative. Normal mood and affect  ASSESSMENT AND PLAN: -Screening colonoscopy:  Will schedule with Dr. Hilarie Fredrickson.  The risks, benefits, and alternatives were discussed with the patient and she consents to proceed.  -Constipation:  Possibly due to her medication.  She also has a possible diagnosis of MS, which could be contributing as well.  Will start daily Miralax.  Addendum: Reviewed and agree with initial management. Jerene Bears, MD

## 2014-05-02 NOTE — Patient Instructions (Signed)
You have been scheduled for a colonoscopy with Dr. Hilarie Fredrickson. Please follow written instructions given to you at your visit today.  Please pick up your prep kit at the pharmacy within the next 1-3 days. If you use inhalers (even only as needed), please bring them with you on the day of your procedure. Your physician has requested that you go to www.startemmi.com and enter the access code given to you at your visit today. This web site gives a general overview about your procedure. However, you should still follow specific instructions given to you by our office regarding your preparation for the procedure.  Please purchase Miralax over the counter and take once daily as directed

## 2014-05-08 ENCOUNTER — Ambulatory Visit (AMBULATORY_SURGERY_CENTER): Payer: 59 | Admitting: Internal Medicine

## 2014-05-08 ENCOUNTER — Encounter: Payer: Self-pay | Admitting: Internal Medicine

## 2014-05-08 VITALS — BP 111/75 | HR 76 | Temp 97.3°F | Resp 20 | Ht 71.0 in | Wt 115.0 lb

## 2014-05-08 DIAGNOSIS — D126 Benign neoplasm of colon, unspecified: Secondary | ICD-10-CM

## 2014-05-08 DIAGNOSIS — D129 Benign neoplasm of anus and anal canal: Secondary | ICD-10-CM

## 2014-05-08 DIAGNOSIS — D128 Benign neoplasm of rectum: Secondary | ICD-10-CM

## 2014-05-08 DIAGNOSIS — Z1211 Encounter for screening for malignant neoplasm of colon: Secondary | ICD-10-CM

## 2014-05-08 MED ORDER — SODIUM CHLORIDE 0.9 % IV SOLN: 500.0000 mL | INTRAVENOUS | Status: DC

## 2014-05-08 MED ORDER — PRAMOXINE-HC 1-1 % EX CREA: TOPICAL_CREAM | Freq: Three times a day (TID) | CUTANEOUS | Status: DC

## 2014-05-08 MED ORDER — POLYETHYLENE GLYCOL 3350 17 G PO PACK: PACK | ORAL | Status: DC

## 2014-05-08 NOTE — Progress Notes (Signed)
Called to room to assist during endoscopic procedure.  Patient ID and intended procedure confirmed with present staff. Received instructions for my participation in the procedure from the performing physician.  

## 2014-05-08 NOTE — Progress Notes (Signed)
Procedure ends, to recovery, report given and VSS. 

## 2014-05-08 NOTE — Op Note (Signed)
[place]  [address] [place], [id number]   COLONOSCOPY PROCEDURE REPORT  PATIENT: Stacy Logan, Stacy Logan  MR#: [id number] BIRTHDATE: 09/10/1966 , 48  yrs. old GENDER: Female ENDOSCOPIST: Jerene Bears, MD REFERRED BS:[id number] Birdie Riddle, M.D. PROCEDURE DATE:  05/08/2014 PROCEDURE:   Colonoscopy with cold biopsy polypectomy and Colonoscopy with snare polypectomy First Screening Colonoscopy - Avg.  risk and is 50 yrs.  old or older Yes.  Prior Negative Screening - Now for repeat screening. N/A  History of Adenoma - Now for follow-up colonoscopy & has been > or = to 3 yrs.  N/A  Polyps Removed Today? Yes. ASA CLASS:   Class III INDICATIONS:average risk screening and first colonoscopy. MEDICATIONS: MAC sedation, administered by CRNA and propofol (Diprivan) 400mg  IV  DESCRIPTION OF PROCEDURE:   After the risks benefits and alternatives of the procedure were thoroughly explained, informed consent was obtained.  A digital rectal exam revealed external hemorrhoids.   The LB [id number]  endoscope was introduced through the anus and advanced to the cecum, which was identified by both the appendix and ileocecal valve. No adverse events experienced.   The quality of the prep was Moviprep fair requiring copious irrigation and lavage. The instrument was then slowly withdrawn as the colon was fully examined.   COLON FINDINGS: A flat, possible, polyp measuring 5 mm in size was found at the ileocecal valve.  A polypectomy was performed with cold forceps.  The resection was complete and the polyp tissue was completely retrieved.   A pedunculated polyp measuring 10 mm in size was found in the sigmoid colon.  A polypectomy was performed using snare cautery.  The resection was complete and the polyp tissue was completely retrieved.   A sessile polyp measuring 5 mm in size with a mucous cap was found in the rectosigmoid colon.  A polypectomy was performed with a cold snare.  The  resection was complete and the polyp tissue was completely retrieved.   There was moderate diverticulosis noted in the descending colon and sigmoid colon with associated tortuosity and angulation.   Large internal and external hemorrhoids were found.  Retroflexed views revealed internal/external hemorrhoids. The time to cecum=6 minutes 37 seconds.  Withdrawal time=20 minutes 12 seconds.  The scope was withdrawn and the procedure completed.  COMPLICATIONS: There were no complications.  ENDOSCOPIC IMPRESSION: 1.   Flat polyp measuring 5 mm in size was found at the ileocecal valve; polypectomy was performed with cold forceps 2.   Pedunculated polyp measuring 10 mm in size was found in the sigmoid colon; polypectomy was performed using snare cautery 3.   Sessile polyp measuring 5 mm in size was found in the rectosigmoid colon; polypectomy was performed with a cold snare 4.   There was moderate diverticulosis noted in the descending colon and sigmoid colon 5.   Large internal and external hemorrhoids    RECOMMENDATIONS: 1.  Hold aspirin, aspirin products, and anti-inflammatory medication for 2 weeks. 2.  High fiber diet 3.  Timing of repeat colonoscopy will be determined by pathology findings. 4.  You will receive a letter within 1-2 weeks with the results of your biopsy as well as final recommendations.  Please call my office if you have not received a letter after 3 weeks.   eSigned:  Jerene Bears, MD 05/08/2014 8:44 AM   cc: The Patient and Annye Asa MD   PATIENT NAME:  Stacy Logan, Stacy Logan MR#: [id number]

## 2014-05-08 NOTE — Patient Instructions (Addendum)
Colon polyps removed today. Diverticulosis and hemorrhoids seen. Handouts given. Hold aspirin, aspirin products, and anti-inflammatory medications for 2 weeks. Pick up new medications from your pharmacy. Thanks you!!  YOU HAD AN ENDOSCOPIC PROCEDURE TODAY AT [facility]: Refer to the procedure report that was given to you for any specific questions about what was found during the examination.  If the procedure report does not answer your questions, please call your gastroenterologist to clarify.  If you requested that your care partner not be given the details of your procedure findings, then the procedure report has been included in a sealed envelope for you to review at your convenience later.  YOU SHOULD EXPECT: Some feelings of bloating in the abdomen. Passage of more gas than usual.  Walking can help get rid of the air that was put into your GI tract during the procedure and reduce the bloating. If you had a lower endoscopy (such as a colonoscopy or flexible sigmoidoscopy) you may notice spotting of blood in your stool or on the toilet paper. If you underwent a bowel prep for your procedure, then you may not have a normal bowel movement for a few days.  DIET: Your first meal following the procedure should be a light meal and then it is ok to progress to your normal diet.  A half-sandwich or bowl of soup is an example of a good first meal.  Heavy or fried foods are harder to digest and may make you feel nauseous or bloated.  Likewise meals heavy in dairy and vegetables can cause extra gas to form and this can also increase the bloating.  Drink plenty of fluids but you should avoid alcoholic beverages for 24 hours.  ACTIVITY: Your care partner should take you home directly after the procedure.  You should plan to take it easy, moving slowly for the rest of the day.  You can resume normal activity the day after the procedure however you should NOT DRIVE or use heavy machinery for 24 hours  (because of the sedation medicines used during the test).    SYMPTOMS TO REPORT IMMEDIATELY: A gastroenterologist can be reached at any hour.  During normal business hours, 8:30 AM to 5:00 PM Monday through Friday, call [phone].  After hours and on weekends, please call the GI answering service at [phone] who will take a message and have the physician on call contact you.   Following lower endoscopy (colonoscopy or flexible sigmoidoscopy):  Excessive amounts of blood in the stool  Significant tenderness or worsening of abdominal pains  Swelling of the abdomen that is new, acute  Fever of 100F or higher  Following upper endoscopy (EGD)  Vomiting of blood or coffee ground material  New chest pain or pain under the shoulder blades  Painful or persistently difficult swallowing  New shortness of breath  Fever of 100F or higher  Black, tarry-looking stools  FOLLOW UP: If any biopsies were taken you will be contacted by phone or by letter within the next 1-3 weeks.  Call your gastroenterologist if you have not heard about the biopsies in 3 weeks.  Our staff will call the home number listed on your records the next business day following your procedure to check on you and address any questions or concerns that you may have at that time regarding the information given to you following your procedure. This is a courtesy call and so if there is no answer at the home number and we have  not heard from you through the emergency physician on call, we will assume that you have returned to your regular daily activities without incident.  SIGNATURES/CONFIDENTIALITY: You and/or your care partner have signed paperwork which will be entered into your electronic medical record.  These signatures attest to the fact that that the information above on your After Visit Summary has been reviewed and is understood.  Full responsibility of the confidentiality of this discharge information lies with you  and/or your care-partner.  Diverticulosis Diverticulosis is the condition that develops when small pouches (diverticula) form in the wall of your colon. Your colon, or large intestine, is where water is absorbed and stool is formed. The pouches form when the inside layer of your colon pushes through weak spots in the outer layers of your colon. CAUSES  No one knows exactly what causes diverticulosis. RISK FACTORS  Being older than 67. Your risk for this condition increases with age. Diverticulosis is rare in people younger than 40 years. By age 53, almost everyone has it.  Eating a low-fiber diet.  Being frequently constipated.  Being overweight.  Not getting enough exercise.  Smoking.  Taking over-the-counter pain medicines, like aspirin and ibuprofen. SYMPTOMS  Most people with diverticulosis do not have symptoms. DIAGNOSIS  Because diverticulosis often has no symptoms, health care providers often discover the condition during an exam for other colon problems. In many cases, a health care provider will diagnose diverticulosis while using a flexible scope to examine the colon (colonoscopy). TREATMENT  If you have never developed an infection related to diverticulosis, you may not need treatment. If you have had an infection before, treatment may include:  Eating more fruits, vegetables, and grains.  Taking a fiber supplement.  Taking a live bacteria supplement (probiotic).  Taking medicine to relax your colon. HOME CARE INSTRUCTIONS   Drink at least 6-8 glasses of water each day to prevent constipation.  Try not to strain when you have a bowel movement.  Keep all follow-up appointments. If you have had an infection before:  Increase the fiber in your diet as directed by your health care provider or dietitian.  Take a dietary fiber supplement if your health care provider approves.  Only take medicines as directed by your health care provider. SEEK MEDICAL CARE IF:     You have abdominal pain.  You have bloating.  You have cramps.  You have not gone to the bathroom in 3 days. SEEK IMMEDIATE MEDICAL CARE IF:   Your pain gets worse.  Yourbloating becomes very bad.  You have a fever or chills, and your symptoms suddenly get worse.  You begin vomiting.  You have bowel movements that are bloody or black. MAKE SURE YOU:  Understand these instructions.  Will watch your condition.  Will get help right away if you are not doing well or get worse. Document Released: 06/03/2004 Document Revised: 09/11/2013 Document Reviewed: 08/01/2013 [facility] Patient Information 2015 [place], [place]. This information is not intended to replace advice given to you by your health care provider. Make sure you discuss any questions you have with your health care provider.

## 2014-05-09 ENCOUNTER — Telehealth: Payer: Self-pay | Admitting: *Deleted

## 2014-05-09 NOTE — Telephone Encounter (Signed)
  Follow up Call-  Call back number 05/08/2014  Post procedure Call Back phone  # [phone]  Permission to leave phone message No     Patient questions:  Do you have a fever, pain , or abdominal swelling? No. Pain Score  0 *  Have you tolerated food without any problems? Yes.    Have you been able to return to your normal activities? Yes.    Do you have any questions about your discharge instructions: Diet   No. Medications  No. Follow up visit  No.  Do you have questions or concerns about your Care? No.  Actions: * If pain score is 4 or above: No action needed, pain <4.

## 2014-05-14 ENCOUNTER — Encounter: Payer: Self-pay | Admitting: Internal Medicine

## 2014-05-21 ENCOUNTER — Ambulatory Visit (INDEPENDENT_AMBULATORY_CARE_PROVIDER_SITE_OTHER): Payer: 59 | Admitting: Neurology

## 2014-05-21 ENCOUNTER — Encounter: Payer: Self-pay | Admitting: Neurology

## 2014-05-21 VITALS — BP 90/64 | HR 70 | Temp 98.6°F | Resp 18 | Ht 71.0 in | Wt 110.6 lb

## 2014-05-21 DIAGNOSIS — F418 Other specified anxiety disorders: Secondary | ICD-10-CM

## 2014-05-21 DIAGNOSIS — F341 Dysthymic disorder: Secondary | ICD-10-CM

## 2014-05-21 DIAGNOSIS — Z9181 History of falling: Secondary | ICD-10-CM

## 2014-05-21 DIAGNOSIS — R634 Abnormal weight loss: Secondary | ICD-10-CM

## 2014-05-21 DIAGNOSIS — R296 Repeated falls: Secondary | ICD-10-CM

## 2014-05-21 DIAGNOSIS — G43909 Migraine, unspecified, not intractable, without status migrainosus: Secondary | ICD-10-CM

## 2014-05-21 DIAGNOSIS — G35 Multiple sclerosis: Secondary | ICD-10-CM

## 2014-05-21 NOTE — Patient Instructions (Addendum)
1.  First we will get MRI of the brain and cervical spine with and without contrast.  Further testing pending these results. MRI at [facility]  1:30pm  Telephone  [phone]

## 2014-05-21 NOTE — Progress Notes (Signed)
NEUROLOGY CONSULTATION NOTE  [name] MRN: [id number] DOB: 1966/08/11  Referring provider: Dr. Birdie Riddle Primary care provider: Dr. Birdie Riddle  Reason for consult:  Possible history of MS  HISTORY OF PRESENT ILLNESS: Stacy Logan is a 48 year old right-handed woman with history of depression, multiple sclerosis and chronic pelvic pain, and overactive bladder who presents for evaluation of multiple sclerosis.  I have no outside records to review.  In 1998, she developed hearing loss in her left ear.  She saw ENT, who ordered an MRI of the brain, which revealed "plaques."  She was referred to a neurologist at that time, who recommended following up in 2 years with another MRI.  In the interim, she did not exhibit any new symptoms, but she never regained her hearing in the left ear.  She did have a follow up MRI with the neurologist, who suggested following up with another MRI in a couple of years.  She never had any other workup performed.  Over the past 3 years, she began experiencing "flare-ups."  During these flare-ups, she will have frequent falls, like her legs giving out from under her.  Her legs feel extremely heavy.  She also experiences weight loss during these spells, although her appetite is good.  Sometimes, she has blurred vision.  She reports that she sometimes uses the wrong words when she speaks.  During these spells, she has an increase in migraines.  These flare-ups usually last 3-4 weeks and occur every 4 to 6 months.  However, she has been experiencing symptoms for the past two months.  She had a flare-up last summer, in which she lost 10 lbs.  Over the past two months, she lost another 12 lbs.  Flare-ups occur in different seasons.  She has chronic symptoms as well.  She reports fatigue, although she is able to perform ADLs and go to work [employer]).  She has urinary frequency and recently had hydrodistention performed.  She does report some constipation, which she attributes  to medication side effects.  She reports muscle spasms in the legs.  She reports numbness in the 4th and 5th digits of both hands, which she attributes to a slipped disc in her neck after a fall about 5 years ago.  She also reports short-term memory problems.  She began having migraines following her pregnancy in 1994, when she had pre-eclampsia.  It starts on the top of her head and then spreads to involve the entire head until it is a holocephalic vice-like pressure.  It is associated with nausea, vomiting, photophobia, phonophobia, osmophobia, and blurred vision.  There is no aura.  It usually lasts 4-5 hours and occurs 1 to 2 times per month. She was recently started on Imitrex, which helps.  She was evaluated by GI for her weight loss.  Colonoscopy revealed some polyps but no obvious cause was found.   She denies family history of MS.  Her sister has migraines.  Recent labs include HIV negative, TSH 0.37  PAST MEDICAL HISTORY: Past Medical History  Diagnosis Date  . Arthritis   . Depression   . Migraines   . History of pneumothorax     1988-- SPONTANEOUS--  RESOLVED W/ CHEST TUBE  . Lesion of bladder   . H/O cold sores   . Borderline diabetes   . Frequency of urination   . Urgency of urination   . Nocturia   . Seasonal allergies   . Anal fissure   . Anxiety   .  IC (interstitial cystitis)   . Pre-eclampsia   . MS (multiple sclerosis)     MRI showed plague on her brain    PAST SURGICAL HISTORY: Past Surgical History  Procedure Laterality Date  . Tonsillectomy and adenoidectomy  1972  . D & c hysteroscopy with polypectomy  12-02-2003  . Cesarean section  1994  . Dx laparoscopy/  fulgeration endometriosis/  appendectomy  1985  . Cystoscopy with hydrodistension and biopsy Bilateral 02/15/2014    Procedure: CYSTOSCOPY  BILATERAL RETROGRADE PYLOGRAM, HYDRODISTENSION, INSTILATION OF MARCAINE AND PYRIDIUM;  Surgeon: Ardis Hughs, MD;  Location: [facility];   Service: Urology;  Laterality: Bilateral;    MEDICATIONS: Current Outpatient Prescriptions on File Prior to Visit  Medication Sig Dispense Refill  . amitriptyline (ELAVIL) 25 MG tablet Take 25 mg by mouth at bedtime.      . diazepam (VALIUM) 10 MG tablet Place 10 mg vaginally at bedtime as needed for anxiety.      . DULoxetine (CYMBALTA) 20 MG capsule Take 2 capsules (40 mg total) by mouth daily.  60 capsule  1  . fesoterodine (TOVIAZ) 8 MG TB24 tablet Take 1 tablet (8 mg total) by mouth daily.  30 tablet  5  . polyethylene glycol (MIRALAX / GLYCOLAX) packet miralax 17 gm, 1 dose every daily  30 each  3  . SUMAtriptan (IMITREX) 50 MG tablet Take 1 tablet (50 mg total) by mouth every 2 (two) hours as needed for migraine or headache (max 2 tabs over 24 hour period.). May repeat in 2 hours if headache persists or recurs.  10 tablet  0  . valACYclovir (VALTREX) 1000 MG tablet Take 2,000 mg by mouth as directed. 2 grams Q12 x2 doses prn cold sores      . docusate sodium (COLACE) 100 MG capsule Take 1 capsule (100 mg total) by mouth 2 (two) times daily as needed (take to keep stool soft.).  60 capsule  0  . hydrOXYzine (VISTARIL) 25 MG capsule Take 25 mg by mouth at bedtime as needed.      . pramoxine-hydrocortisone (ANALPRAM HC) cream Apply topically 3 (three) times daily.  30 g  1   No current facility-administered medications on file prior to visit.    ALLERGIES: Allergies  Allergen Reactions  . Codeine     Makes her "busy", itchy  . Meperidine Hcl Nausea And Vomiting    SEVERE N/V  . Morphine Other (See Comments)    "SKIN CRAWLS"    FAMILY HISTORY: Family History  Problem Relation Age of Onset  . Hypertension Mother   . Diabetes Father   . Prostate cancer Father   . Lung cancer Father   . Cancer Father     cheek of mouth  . Diabetes Paternal Grandmother   . Diabetes Paternal Grandfather   . Diabetes Paternal Uncle     x 4  . Diabetes Paternal Aunt   . Colon cancer Maternal  Uncle   . Esophageal cancer Neg Hx   . Rectal cancer Neg Hx   . Stomach cancer Neg Hx     SOCIAL HISTORY: History   Social History  . Marital Status: Divorced    Spouse Name: N/A    Number of Children: 1  . Years of Education: N/A   Occupational History  . self-employed house cleaning    Social History Main Topics  . Smoking status: Former Smoker -- 0.50 packs/day for 20 years    Types: Cigarettes  Quit date: 01/17/2011  . Smokeless tobacco: Never Used  . Alcohol Use: No  . Drug Use: No  . Sexual Activity: No   Other Topics Concern  . Not on file   Social History Narrative  . No narrative on file    REVIEW OF SYSTEMS: Constitutional: Fatigue, weight loss Eyes: No visual changes, double vision, eye pain Ear, nose and throat: Haring loss in left ear Cardiovascular: No chest pain, palpitations Respiratory:  No shortness of breath at rest or with exertion, wheezes GastrointestinaI: No nausea, vomiting, diarrhea, abdominal pain, fecal incontinence Genitourinary:  No dysuria, urinary retention or frequency Musculoskeletal:  Leg pain Integumentary: No rash, pruritus, skin lesions Neurological: as above Psychiatric: Depression Endocrine: Fatigue, weight loss Hematologic/Lymphatic:  No anemia, purpura, petechiae. Allergic/Immunologic: no itchy/runny eyes, nasal congestion, recent allergic reactions, rashes  PHYSICAL EXAM: Filed Vitals:   05/21/14 1346  BP: 90/64  Pulse: 70  Temp: 98.6 F (37 C)  Resp: 18   General: No acute distress.  Thin. Head:  Normocephalic/atraumatic Neck: supple, no paraspinal tenderness, full range of motion Back: No paraspinal tenderness Heart: regular rate and rhythm Lungs: Clear to auscultation bilaterally. Vascular: No carotid bruits. Neurological Exam: Mental status: alert and oriented to person, place, and time, recent and remote memory intact, fund of knowledge intact, attention and concentration intact, speech fluent and not  dysarthric, language intact. Cranial nerves: CN I: not tested CN II: pupils equal, round and reactive to light, visual fields intact, fundi not visualized CN III, IV, VI:  full range of motion, no nystagmus, no ptosis CN V: facial sensation intact CN VII: upper and lower face symmetric CN VIII: reduced hearing on the left. CN IX, X: gag intact, uvula midline CN XI: sternocleidomastoid and trapezius muscles intact CN XII: tongue midline Bulk & Tone: normal, no fasciculations. Motor: 5/5 throughout Sensation: reduced pinprick sensation in the left upper and lower arm, including the 4th and 5th digits, as well as reduced pinprick in the 4th and 5th digits of the right hand.  Allodynia in the snuffbox bilaterally. Deep Tendon Reflexes: 3+ throughout, toes downgoing Finger to nose testing: postural tremor (right greater than left) and kinetic tremor.  No dysmetria Heel to shin: no dysmetria Gait: normal station and stride.  Able to turn, walk on toes, heels and in tandem. Romberg with mild sway.  IMPRESSION: History of abnormal MRI but never officially given a diagnosis of MS Frequent falls Migraine without aura Weight loss  PLAN: 1.  First we will get MRI of the brain and cervical spine, with and without contrast. 2.  Pending results, we will decide next step, such as blood work or possibly LP if needed. 3.  Follow up soon after.  45 minutes spent with patient, over 50% spent counseling and coordinating care.  Thank you for allowing me to take part in the care of this patient.  Metta Clines, DO  CC:  Annye Asa, MD

## 2014-05-22 ENCOUNTER — Ambulatory Visit
Admission: RE | Admit: 2014-05-22 | Discharge: 2014-05-22 | Disposition: A | Discharge status: Home / Self Care | Payer: 59 | Source: Ambulatory Visit | Attending: Neurology | Admitting: Neurology

## 2014-05-22 DIAGNOSIS — R296 Repeated falls: Secondary | ICD-10-CM

## 2014-05-22 DIAGNOSIS — G35 Multiple sclerosis: Secondary | ICD-10-CM

## 2014-05-22 DIAGNOSIS — G43909 Migraine, unspecified, not intractable, without status migrainosus: Secondary | ICD-10-CM

## 2014-05-22 DIAGNOSIS — G35D Multiple sclerosis, unspecified: Secondary | ICD-10-CM

## 2014-05-22 DIAGNOSIS — R634 Abnormal weight loss: Secondary | ICD-10-CM

## 2014-05-22 DIAGNOSIS — F418 Other specified anxiety disorders: Secondary | ICD-10-CM

## 2014-05-22 MED ORDER — GADOBENATE DIMEGLUMINE 529 MG/ML IV SOLN
10.0000 mL | Freq: Once | INTRAVENOUS | Status: AC | PRN
  Administered 2014-05-22: 10 mL via INTRAVENOUS

## 2014-05-23 ENCOUNTER — Ambulatory Visit: Payer: 59 | Admitting: Family Medicine

## 2014-05-29 ENCOUNTER — Ambulatory Visit: Payer: 59 | Admitting: Dietician

## 2014-05-30 ENCOUNTER — Ambulatory Visit (INDEPENDENT_AMBULATORY_CARE_PROVIDER_SITE_OTHER): Payer: 59 | Admitting: Neurology

## 2014-05-30 ENCOUNTER — Ambulatory Visit (INDEPENDENT_AMBULATORY_CARE_PROVIDER_SITE_OTHER): Payer: 59 | Admitting: Family Medicine

## 2014-05-30 ENCOUNTER — Encounter: Payer: 59 | Attending: Family Medicine | Admitting: Dietician

## 2014-05-30 ENCOUNTER — Encounter: Payer: Self-pay | Admitting: Neurology

## 2014-05-30 ENCOUNTER — Encounter: Payer: Self-pay | Admitting: Family Medicine

## 2014-05-30 ENCOUNTER — Telehealth: Payer: Self-pay | Admitting: *Deleted

## 2014-05-30 VITALS — BP 92/68 | HR 110 | Temp 99.3°F | Resp 18 | Wt 110.0 lb

## 2014-05-30 VITALS — Ht 71.0 in | Wt 110.3 lb

## 2014-05-30 VITALS — BP 120/82 | HR 97 | Temp 97.4°F | Resp 16 | Wt 110.2 lb

## 2014-05-30 DIAGNOSIS — F418 Other specified anxiety disorders: Secondary | ICD-10-CM

## 2014-05-30 DIAGNOSIS — G35 Multiple sclerosis: Secondary | ICD-10-CM

## 2014-05-30 DIAGNOSIS — H9192 Unspecified hearing loss, left ear: Secondary | ICD-10-CM

## 2014-05-30 DIAGNOSIS — Z713 Dietary counseling and surveillance: Secondary | ICD-10-CM | POA: Insufficient documentation

## 2014-05-30 DIAGNOSIS — H9190 Unspecified hearing loss, unspecified ear: Secondary | ICD-10-CM

## 2014-05-30 DIAGNOSIS — R634 Abnormal weight loss: Secondary | ICD-10-CM

## 2014-05-30 DIAGNOSIS — F341 Dysthymic disorder: Secondary | ICD-10-CM

## 2014-05-30 MED ORDER — ALPRAZOLAM 0.5 MG PO TABS: 0.5000 mg | ORAL_TABLET | Freq: Two times a day (BID) | ORAL | Status: DC | PRN

## 2014-05-30 MED ORDER — AMITRIPTYLINE HCL 25 MG PO TABS: 50.0000 mg | ORAL_TABLET | Freq: Every day | ORAL | Status: DC

## 2014-05-30 NOTE — Progress Notes (Signed)
NEUROLOGY FOLLOW UP OFFICE NOTE  Stacy Logan [id number]  HISTORY OF PRESENT ILLNESS: Stacy Logan is a 48 year old right-handed woman with history of depression, multiple sclerosis and chronic pelvic pain, and overactive bladder who presents following testing for possible multiple sclerosis.  Images personally reviewed.  She is accompanied by her sister.  UPDATE: MRI of the brain with and without contrast was performed on 05/22/14, which revealed multiple non-enhancing bilateral periventricular and subcortical hyperintensities.  MRI of the cervical spine with and without contrast showed multilevel disc degeneration with severe spinal stenosis with right greater than left foraminal stenosis at C5-6, as well as moderate spinal stenosis at C3-4 and C4-5.  6 mm enhancing focus in seen in the spinal cord at C5-6, as well as subtle enhancing lesion at C5.  An incidental 1.6 cm right thyroid nodule was noted.  HISTORY: In 1998, she developed hearing loss in her left ear.  She saw ENT, who ordered an MRI of the brain, which revealed "plaques."  She was referred to a neurologist at that time, who recommended following up in 2 years with another MRI.  In the interim, she did not exhibit any new symptoms, but she never regained her hearing in the left ear.  She did have a follow up MRI with the neurologist, who suggested following up with another MRI in a couple of years.  She never had any other workup performed.  Over the past 3 years, she began experiencing "flare-ups."  During these flare-ups, she will have frequent falls, like her legs giving out from under her.  Her legs feel extremely heavy.  She also experiences weight loss during these spells, although her appetite is good.  Sometimes, she has blurred vision.  She reports that she sometimes uses the wrong words when she speaks.  During these spells, she has an increase in migraines.  These flare-ups usually last 3-4 weeks and occur every 4 to 6 months.   However, she has been experiencing symptoms for the past two months.  She had a flare-up last summer, in which she lost 10 lbs.  Over the past two months, she lost another 12 lbs.  Flare-ups occur in different seasons.  She has chronic symptoms as well.  She reports fatigue, although she is able to perform ADLs and go to work [employer]).  She has urinary frequency and recently had hydrodistention performed.  She does report some constipation, which she attributes to medication side effects.  She reports muscle spasms in the legs.  She reports numbness in the 4th and 5th digits of both hands, which she attributes to a slipped disc in her neck after a fall about 5 years ago.  She also reports short-term memory problems.  She began having migraines following her pregnancy in 1994, when she had pre-eclampsia.  It starts on the top of her head and then spreads to involve the entire head until it is a holocephalic vice-like pressure.  It is associated with nausea, vomiting, photophobia, phonophobia, osmophobia, and blurred vision.  There is no aura.  It usually lasts 4-5 hours and occurs 1 to 2 times per month. She was recently started on Imitrex, which helps.  She was evaluated by GI for her weight loss.  Colonoscopy revealed some polyps but no obvious cause was found.   She denies family history of MS.  Her sister has migraines.  Recent labs include HIV negative, TSH 0.37  PAST MEDICAL HISTORY: Past Medical History  Diagnosis Date  .  Arthritis   . Depression   . Migraines   . History of pneumothorax     1988-- SPONTANEOUS--  RESOLVED W/ CHEST TUBE  . Lesion of bladder   . H/O cold sores   . Borderline diabetes   . Frequency of urination   . Urgency of urination   . Nocturia   . Seasonal allergies   . Anal fissure   . Anxiety   . IC (interstitial cystitis)   . Pre-eclampsia   . MS (multiple sclerosis)     MRI showed plague on her brain    MEDICATIONS: Current Outpatient  Prescriptions on File Prior to Visit  Medication Sig Dispense Refill  . diazepam (VALIUM) 10 MG tablet Place 10 mg vaginally at bedtime as needed for anxiety.      . DULoxetine (CYMBALTA) 20 MG capsule Take 2 capsules (40 mg total) by mouth daily.  60 capsule  1  . fesoterodine (TOVIAZ) 8 MG TB24 tablet Take 1 tablet (8 mg total) by mouth daily.  30 tablet  5  . hydrOXYzine (VISTARIL) 25 MG capsule Take 25 mg by mouth at bedtime as needed.      . polyethylene glycol (MIRALAX / GLYCOLAX) packet miralax 17 gm, 1 dose every daily  30 each  3  . pramoxine-hydrocortisone (ANALPRAM HC) cream Apply topically 3 (three) times daily.  30 g  1  . SUMAtriptan (IMITREX) 50 MG tablet Take 1 tablet (50 mg total) by mouth every 2 (two) hours as needed for migraine or headache (max 2 tabs over 24 hour period.). May repeat in 2 hours if headache persists or recurs.  10 tablet  0  . valACYclovir (VALTREX) 1000 MG tablet Take 2,000 mg by mouth as directed. 2 grams Q12 x2 doses prn cold sores      . docusate sodium (COLACE) 100 MG capsule Take 1 capsule (100 mg total) by mouth 2 (two) times daily as needed (take to keep stool soft.).  60 capsule  0   No current facility-administered medications on file prior to visit.    ALLERGIES: Allergies  Allergen Reactions  . Codeine     Makes her "busy", itchy  . Meperidine Hcl Nausea And Vomiting    SEVERE N/V  . Morphine Other (See Comments)    "SKIN CRAWLS"    FAMILY HISTORY: Family History  Problem Relation Age of Onset  . Hypertension Mother   . Diabetes Father   . Prostate cancer Father   . Lung cancer Father   . Cancer Father     cheek of mouth  . Diabetes Paternal Grandmother   . Diabetes Paternal Grandfather   . Diabetes Paternal Uncle     x 4  . Diabetes Paternal Aunt   . Colon cancer Maternal Uncle   . Esophageal cancer Neg Hx   . Rectal cancer Neg Hx   . Stomach cancer Neg Hx     SOCIAL HISTORY: History   Social History  . Marital  Status: Single    Spouse Name: N/A    Number of Children: 1  . Years of Education: N/A   Occupational History  . self-employed house cleaning    Social History Main Topics  . Smoking status: Former Smoker -- 0.50 packs/day for 20 years    Types: Cigarettes    Quit date: 01/17/2011  . Smokeless tobacco: Never Used  . Alcohol Use: No  . Drug Use: No  . Sexual Activity: No   Other Topics Concern  .  Not on file   Social History Narrative  . No narrative on file    REVIEW OF SYSTEMS: Constitutional: No fevers, chills, or sweats, no generalized fatigue, change in appetite Eyes: visual disturbance Ear, nose and throat: No hearing loss, ear pain, nasal congestion, sore throat Cardiovascular: No chest pain, palpitations Respiratory:  Shortness of breath GastrointestinaI: No nausea, vomiting, diarrhea, abdominal pain, fecal incontinence Genitourinary:  Increased urinary frequency Musculoskeletal: Neck and back pain Integumentary: No rash, pruritus, skin lesions Neurological: as above Psychiatric: Anxiety Endocrine: No palpitations, fatigue, diaphoresis, mood swings, change in appetite, change in weight, increased thirst Hematologic/Lymphatic:  No anemia, purpura, petechiae. Allergic/Immunologic: no itchy/runny eyes, nasal congestion, recent allergic reactions, rashes  PHYSICAL EXAM: Filed Vitals:   05/30/14 1101  BP: 92/68  Pulse: 110  Temp: 99.3 F (37.4 C)  Resp: 18   General: No acute distress Head:  Normocephalic/atraumatic   IMPRESSION: 1.  I suspect relapsing-remitting multiple sclerosis, as demonstrated by demyelinating plaques separated in time and space, thus fulfilling McDonald criteria.  There was a question regarding whether the signal abnormality in the spinal cord could be related to the spinal stenosis, however, if this were the case, it would not show contrast-enhancement.  Thus, it likely represents acute demyelination.  How much of the gait and leg  weakness is related to spinal stenosis is uncertain, but I suspect that since symptoms are episodic, it is likely more related to demyelinating disease. 2.  Cervical spinal stenosis 3.  Migraine without aura  PLAN: 1.  Even though she meets the McDonald criteria, she has a complicated medical history because she wasn't followed by a physician for quite some while.  Also, I don't have an explanation for her unintentional weight loss (she and her sister maintain that she eats well).  Therefore, I will proceed with a lumbar puncture.  We will check CSF for cell count with diff, protein, glucose, gram stain and culture, Lyme, cytology, oligoclonal bands, IgG index, ACE. 2.  We will check blood for ANA, sed rate, ANCA, SSA/SSB antibodies 3.  After spinal tap, we will schedule for Solumedrol 1gm IV daily for 3 days. 4.  For spinal stenosis, we will refer to neurosurgery.  Even though clinically it may not be contributing to symptoms, she does have significant stenosis and would be concerned for cord injury if she should happen to fall and hurt her head or neck. 5.  I increased the amitriptyline to 50mg  at bedtime. 6.  Refer to physical therapy and ophthalmology. 7.  Follow up after spinal tap to discuss further management (such as disease-modifying agents)  45 minutes spent with patient and sister, 100% spent discussing the MRI results and discussing MS and plan.  Metta Clines, DO  CC:  Annye Asa, MD

## 2014-05-30 NOTE — Telephone Encounter (Signed)
Patient is aware she has appt with Dr Valetta Close 06/12/14 at 2:30 and Dr Arnoldo Morale 06/05/14 at 12:15

## 2014-05-30 NOTE — Addendum Note (Signed)
Addended by: Charyl Bigger E on: 05/30/2014 01:38 PM   Modules accepted: Orders

## 2014-05-30 NOTE — Patient Instructions (Addendum)
Have a Boost Plus (360 calorie) or Carnation instant breakfast with whole milk at your 11:00 meal. Aim to have 3 Boosts or Carnations each day. Continue having unflavored Unjury in your drinks.  Follow up with a counselor Karna Christmas) to help deal with stress.
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
## 2014-05-30 NOTE — Progress Notes (Signed)
Pre visit review using our clinic review tool, if applicable. No additional management support is needed unless otherwise documented below in the visit note. 

## 2014-05-30 NOTE — Progress Notes (Signed)
  Medical Nutrition Therapy:  Appt start time: 240 end time:  300.   Assessment:  Primary concerns today: Stacy Logan is here today since she is losing weight. Weight is stable since last visit though is doing better. Has been diagnosed with MS. Will be seeing a Social worker. Having a spinal tap to try to figure out why she is losing weight. Has been having 12 Boost per week and adding Unflavored Unjury to drinks. Has been eating a lot of [name] food. Having full fat yogurt and ice cream.   Has about 2 good meals per day and 2-3 Boosts per day. Overall feels like she is eating more food.    Preferred Learning Style:   No preference indicated   Learning Readiness:   Ready  MEDICATIONS: see list   DIETARY INTAKE:  Avoided foods include: none.    24-hr recall:  B ( AM): nothing, drinks coffee with cream and small amount of sugar Snk ( AM): none  L ( PM): yogurt or crackers with water or juice Snk ( PM): sometimes may have yogurt or popcorn, cookies D ( PM): flounder, hush puppies, slaw, and potoes Snk ( PM): "whatever she wants" fruit, cereal, cookies, milk, milkshake, Boost with Whole Milk, Protein Bars Beverages: water or flavored water or juice  Usual physical activity: not exercising since she is afraid she will lose more weight, work is active  Estimated energy needs: 1600 calories 180 g carbohydrates 120 g protein 44 g fat  Progress Towards Goal(s):  In progress.   Nutritional Diagnosis:  [place]-3.2 Unintentional weight loss As related to chronic illness, unstructured meal pattern, and stress.  As evidenced by BMI of 15.6.    Intervention:  Nutrition counseling provided.   Plan: Have a Boost Plus (360 calorie) or Carnation instant breakfast with whole milk at your 11:00 meal. Aim to have 3 Boosts or Carnations each day. Continue having unflavored Unjury in your drinks.  Follow up with a counselor Stacy Logan) to help deal with stress.   Teaching Method Utilized:   Visual Auditory Hands on  Handouts given during visit include:  Therapist List  Barriers to learning/adherence to lifestyle change: stress, doesn't eat during the day time  Demonstrated degree of understanding via:  Teach Back   Monitoring/Evaluation:  Dietary intake, exercise, and body weight in 2 month(s).

## 2014-05-30 NOTE — Patient Instructions (Signed)
1.  We will check the spinal fluid for cell count with diff, protein, glucose, gram stain and culture, Lyme, cytology, oligoclonal bands, IgG index, ACE. 2.  We will check blood for ANA, sed rate, ANCA, SSA/SSB antibodies 3.  After spinal tap, we will schedule for Solumedrol 1gm IV daily for 3 days. 4.  For spinal stenosis, we will refer to neurosurgery 5.  I increased the amitriptyline to 50mg  at bedtime. 6.  Refer to physical therapy. 7.  Follow up after spinal tap.

## 2014-05-30 NOTE — Addendum Note (Signed)
Addended by: Charyl Bigger E on: 05/30/2014 02:27 PM   Modules accepted: Orders

## 2014-05-30 NOTE — Patient Instructions (Signed)
Follow up in 2 months to recheck mood We'll call you with your audiology appt for the hearing issues Call and schedule counseling w/ Terri [phone]) Continue to eat regularly Continue the Cymbalta as directed Increase the Amitriptyline as directed by Dr Tomi Likens Add the Alprazolam as needed for high anxiety Call with any questions or concerns Hang in there!

## 2014-05-31 NOTE — Telephone Encounter (Signed)
Error

## 2014-06-04 NOTE — Assessment & Plan Note (Signed)
Pt was officially dx'd w/ relapsing/remitting MS by Dr Tomi Likens earlier today.  Has LP scheduled.  Pt is having very difficult time accepting this dx.  Encouraged her to start counseling.  Will continue to follow and offer support.

## 2014-06-04 NOTE — Assessment & Plan Note (Signed)
Deteriorated.  Dr Tomi Likens just increased pt's amitriptyline.  Discussed that she needs to allow time for this to work.  Pt to continue Cymbalta.  Will add xanax prn for panicked moments.  Pt to also call and schedule counseling w/ Terri to attempt to deal w/ all that she has going on- including recent confirmation of MS dx.

## 2014-06-04 NOTE — Assessment & Plan Note (Signed)
New.  Refer to audiometry for complete evaluation.

## 2014-06-06 LAB — ANA: Anti Nuclear Antibody(ANA): NEGATIVE

## 2014-06-06 LAB — SJOGRENS SYNDROME-B EXTRACTABLE NUCLEAR ANTIBODY: SSB (LA) (ENA) ANTIBODY, IGG: NEGATIVE

## 2014-06-06 LAB — ANCA SCREEN W REFLEX TITER
ATYPICAL P-ANCA SCREEN: NEGATIVE
c-ANCA Screen: NEGATIVE
p-ANCA Screen: NEGATIVE

## 2014-06-06 LAB — SEDIMENTATION RATE: Sed Rate: 4 mm/hr (ref 0–22)

## 2014-06-06 LAB — SJOGRENS SYNDROME-A EXTRACTABLE NUCLEAR ANTIBODY: SSA (Ro) (ENA) Antibody, IgG: <1

## 2014-06-07 ENCOUNTER — Other Ambulatory Visit: Payer: 59

## 2014-06-07 ENCOUNTER — Telehealth: Payer: Self-pay | Admitting: *Deleted

## 2014-06-07 NOTE — Telephone Encounter (Signed)
Message copied by Claudie Revering on Fri Jun 07, 2014  8:23 AM ------      Message from: JAFFE, ADAM R      Created: Fri Jun 07, 2014  7:02 AM       Blood work looks okay      ----- Message -----         From: Lab in [place]: 06/06/2014   2:17 PM           To: Dudley Major, DO                   ------

## 2014-06-07 NOTE — Telephone Encounter (Signed)
Patient is aware of normal labs  

## 2014-06-10 ENCOUNTER — Other Ambulatory Visit: Payer: Self-pay | Admitting: Neurosurgery

## 2014-06-10 ENCOUNTER — Ambulatory Visit: Payer: 59 | Admitting: Physical Therapy

## 2014-06-19 ENCOUNTER — Telehealth: Payer: Self-pay | Admitting: Family Medicine

## 2014-06-19 ENCOUNTER — Encounter (HOSPITAL_BASED_OUTPATIENT_CLINIC_OR_DEPARTMENT_OTHER): Payer: Self-pay | Admitting: Emergency Medicine

## 2014-06-19 ENCOUNTER — Emergency Department (HOSPITAL_BASED_OUTPATIENT_CLINIC_OR_DEPARTMENT_OTHER)
Admission: EM | Admit: 2014-06-19 | Discharge: 2014-06-19 | Disposition: A | Discharge status: Home / Self Care | Payer: 59 | Attending: Emergency Medicine | Admitting: Emergency Medicine

## 2014-06-19 DIAGNOSIS — G43009 Migraine without aura, not intractable, without status migrainosus: Secondary | ICD-10-CM

## 2014-06-19 DIAGNOSIS — F329 Major depressive disorder, single episode, unspecified: Secondary | ICD-10-CM | POA: Diagnosis not present

## 2014-06-19 DIAGNOSIS — Z8742 Personal history of other diseases of the female genital tract: Secondary | ICD-10-CM | POA: Diagnosis not present

## 2014-06-19 DIAGNOSIS — Z87891 Personal history of nicotine dependence: Secondary | ICD-10-CM | POA: Insufficient documentation

## 2014-06-19 DIAGNOSIS — G43909 Migraine, unspecified, not intractable, without status migrainosus: Secondary | ICD-10-CM | POA: Insufficient documentation

## 2014-06-19 DIAGNOSIS — F411 Generalized anxiety disorder: Secondary | ICD-10-CM | POA: Insufficient documentation

## 2014-06-19 DIAGNOSIS — Z79899 Other long term (current) drug therapy: Secondary | ICD-10-CM | POA: Diagnosis not present

## 2014-06-19 DIAGNOSIS — H53149 Visual discomfort, unspecified: Secondary | ICD-10-CM | POA: Insufficient documentation

## 2014-06-19 DIAGNOSIS — R1110 Vomiting, unspecified: Secondary | ICD-10-CM | POA: Insufficient documentation

## 2014-06-19 DIAGNOSIS — F3289 Other specified depressive episodes: Secondary | ICD-10-CM | POA: Insufficient documentation

## 2014-06-19 DIAGNOSIS — Z8739 Personal history of other diseases of the musculoskeletal system and connective tissue: Secondary | ICD-10-CM | POA: Insufficient documentation

## 2014-06-19 MED ORDER — SUMATRIPTAN SUCCINATE 100 MG PO TABS: ORAL_TABLET | ORAL | Status: DC

## 2014-06-19 MED ORDER — METOCLOPRAMIDE HCL 5 MG/ML IJ SOLN
10.0000 mg | Freq: Once | INTRAMUSCULAR | Status: AC
  Administered 2014-06-19: 10 mg via INTRAVENOUS
  Filled 2014-06-19: qty 2

## 2014-06-19 MED ORDER — DIPHENHYDRAMINE HCL 50 MG/ML IJ SOLN
25.0000 mg | Freq: Once | INTRAMUSCULAR | Status: AC
  Administered 2014-06-19: 25 mg via INTRAVENOUS
  Filled 2014-06-19: qty 1

## 2014-06-19 MED ORDER — DEXAMETHASONE SODIUM PHOSPHATE 4 MG/ML IJ SOLN
INTRAMUSCULAR | Status: AC
  Administered 2014-06-19: 10 mg
  Filled 2014-06-19: qty 3

## 2014-06-19 MED ORDER — DEXAMETHASONE SODIUM PHOSPHATE 10 MG/ML IJ SOLN: 10.0000 mg | Freq: Once | INTRAMUSCULAR | Status: AC

## 2014-06-19 MED ORDER — SODIUM CHLORIDE 0.9 % IV BOLUS (SEPSIS)
1000.0000 mL | Freq: Once | INTRAVENOUS | Status: AC
  Administered 2014-06-19: 1000 mL via INTRAVENOUS

## 2014-06-19 MED ORDER — KETOROLAC TROMETHAMINE 30 MG/ML IJ SOLN
30.0000 mg | Freq: Once | INTRAMUSCULAR | Status: AC
  Administered 2014-06-19: 30 mg via INTRAVENOUS
  Filled 2014-06-19: qty 1

## 2014-06-19 NOTE — Telephone Encounter (Signed)
Can you please see what pt needs to speak to a nurse about?

## 2014-06-19 NOTE — ED Notes (Signed)
Pt c/o " migraine" x 1 week  

## 2014-06-19 NOTE — ED Provider Notes (Signed)
CSN: [id number]     Arrival date & time 06/19/14  1906 History  This chart was scribed for Orpah Greek, by Tula Nakayama, ED Scribe. This patient was seen in room MH08/MH08 and the patient's care was started at 7:20 PM.      Chief Complaint  Patient presents with  . Migraine   The history is provided by the patient. No language interpreter was used.   HPI Comments: Stacy Logan is a 48 y.o. female with a history of MS who presents to the Emergency Department complaining of a gradually worsening, constant migraine headaches that started about one week PTA. Pt states that pain is across the forehead, behind the eyes, and radiates to the neck. She complains of vomiting and photophobia as associated symptoms. Pt is scheduled for anterior cervical decompression/dicectomy in two weeks. She called neurologist regarding her current migraine and he referred her to the ED. Pt denies trying any treatments for relief to her symptoms. Pt allergic to Codeine and Morphine.  Past Medical History  Diagnosis Date  . Arthritis   . Depression   . Migraines   . History of pneumothorax     1988-- SPONTANEOUS--  RESOLVED W/ CHEST TUBE  . Lesion of bladder   . H/O cold sores   . Borderline diabetes   . Frequency of urination   . Urgency of urination   . Nocturia   . Seasonal allergies   . Anal fissure   . Anxiety   . IC (interstitial cystitis)   . Pre-eclampsia   . MS (multiple sclerosis)     MRI showed plague on her brain   Past Surgical History  Procedure Laterality Date  . Tonsillectomy and adenoidectomy  1972  . D & c hysteroscopy with polypectomy  12-02-2003  . Cesarean section  1994  . Dx laparoscopy/  fulgeration endometriosis/  appendectomy  1985  . Cystoscopy with hydrodistension and biopsy Bilateral 02/15/2014    Procedure: CYSTOSCOPY  BILATERAL RETROGRADE PYLOGRAM, HYDRODISTENSION, INSTILATION OF MARCAINE AND PYRIDIUM;  Surgeon: Ardis Hughs, MD;  Location: [facility];  Service: Urology;  Laterality: Bilateral;  . Appendectomy     Family History  Problem Relation Age of Onset  . Hypertension Mother   . Diabetes Father   . Prostate cancer Father   . Lung cancer Father   . Cancer Father     cheek of mouth  . Diabetes Paternal Grandmother   . Diabetes Paternal Grandfather   . Diabetes Paternal Uncle     x 4  . Diabetes Paternal Aunt   . Colon cancer Maternal Uncle   . Esophageal cancer Neg Hx   . Rectal cancer Neg Hx   . Stomach cancer Neg Hx    History  Substance Use Topics  . Smoking status: Former Smoker -- 0.50 packs/day for 20 years    Types: Cigarettes    Quit date: 01/17/2011  . Smokeless tobacco: Never Used  . Alcohol Use: No   OB History   Grav Para Term Preterm Abortions TAB SAB Ect Mult Living                 Review of Systems  Eyes: Positive for photophobia.  Gastrointestinal: Positive for vomiting.  Neurological: Positive for headaches.  All other systems reviewed and are negative.     Allergies  Codeine; Meperidine hcl; and Morphine  Home Medications   Prior to Admission medications   Medication Sig Start Date End Date Taking?  Authorizing Provider  ALPRAZolam Duanne Moron) 0.5 MG tablet Take 1 tablet (0.5 mg total) by mouth 2 (two) times daily as needed for anxiety. 05/30/14   Midge Minium, MD  amitriptyline (ELAVIL) 25 MG tablet Take 2 tablets (50 mg total) by mouth at bedtime. 05/30/14   Adam Melvern Sample, DO  diazepam (VALIUM) 10 MG tablet Place 10 mg vaginally at bedtime as needed for anxiety.    Historical Provider, MD  docusate sodium (COLACE) 100 MG capsule Take 1 capsule (100 mg total) by mouth 2 (two) times daily as needed (take to keep stool soft.). 02/15/14   Ardis Hughs, MD  DULoxetine (CYMBALTA) 20 MG capsule Take 2 capsules (40 mg total) by mouth daily. 04/27/14   Tammi Sou, MD  fesoterodine (TOVIAZ) 8 MG TB24 tablet Take 1 tablet (8 mg total) by mouth daily. 02/15/14   Ardis Hughs, MD  hydrOXYzine (VISTARIL) 25 MG capsule Take 25 mg by mouth at bedtime as needed.    Historical Provider, MD  polyethylene glycol (MIRALAX / GLYCOLAX) packet miralax 17 gm, 1 dose every daily 05/08/14   Jerene Bears, MD  pramoxine-hydrocortisone [facility]) cream Apply topically 3 (three) times daily. 05/08/14   Jerene Bears, MD  SUMAtriptan (IMITREX) 50 MG tablet Take 1 tablet (50 mg total) by mouth every 2 (two) hours as needed for migraine or headache (max 2 tabs over 24 hour period.). May repeat in 2 hours if headache persists or recurs. 04/23/14   Meriam Sprague Saguier, PA-C  valACYclovir (VALTREX) 1000 MG tablet Take 2,000 mg by mouth as directed. 2 grams Q12 x2 doses prn cold sores 01/16/14   Midge Minium, MD   BP 145/89  Pulse 88  Temp(Src) 97.9 F (36.6 C) (Oral)  Resp 90  Ht 5\' 11"  (1.803 m)  Wt 117 lb (53.071 kg)  BMI 16.33 kg/m2  SpO2 100% Physical Exam  Nursing note and vitals reviewed. Constitutional: She is oriented to person, place, and time. She appears well-developed and well-nourished. No distress.  HENT:  Head: Normocephalic and atraumatic.  Right Ear: Hearing normal.  Left Ear: Hearing normal.  Nose: Nose normal.  Mouth/Throat: Oropharynx is clear and moist and mucous membranes are normal.  Eyes: Conjunctivae and EOM are normal. Pupils are equal, round, and reactive to light.  Neck: Normal range of motion. Neck supple.  Cardiovascular: Regular rhythm, S1 normal and S2 normal.  Exam reveals no gallop and no friction rub.   No murmur heard. Pulmonary/Chest: Effort normal and breath sounds normal. No respiratory distress. She exhibits no tenderness.  Abdominal: Soft. Normal appearance and bowel sounds are normal. There is no hepatosplenomegaly. There is no tenderness. There is no rebound, no guarding, no tenderness at McBurney's point and negative Murphy's sign. No hernia.  Musculoskeletal: Normal range of motion.  Neurological: She is alert and oriented to  person, place, and time. She has normal strength. No cranial nerve deficit or sensory deficit. Coordination normal. GCS eye subscore is 4. GCS verbal subscore is 5. GCS motor subscore is 6.  Skin: Skin is warm, dry and intact. No rash noted. No cyanosis.  Psychiatric: She has a normal mood and affect. Her speech is normal and behavior is normal. Thought content normal.    ED Course  Procedures (including critical care time) DIAGNOSTIC STUDIES: Oxygen Saturation is 100% on RA, normal by my interpretation.    COORDINATION OF CARE: 7:26 PM Will order Toradol, Reglan, Benadryl, Decadron, and IV fluids. Discussed  treatment plan with pt at bedside and pt agreed to plan.  Labs Review Labs Reviewed - No data to display  Imaging Review No results found.   EKG Interpretation None      MDM   Final diagnoses:  None   migraine headache  Patient presents to the ER for evaluation of migraine headache. She has had progressively worsening headache over a period a week. He was slow in onset, typical progression for her migraines. She reports that it is a particularly severe migraine, but is in no way different from migraines that she has had in the past. Patient has a normal neurologic exam. There no concerning features. Patient has had considerable improvement after migraine cocktail treatments. Will discharge, will provide Imitrex prescription.  I personally performed the services described in this documentation, which was scribed in my presence. The recorded information has been reviewed and is accurate.      Orpah Greek, MD 06/19/14 2055

## 2014-06-19 NOTE — Telephone Encounter (Signed)
Agree w/ either Neuro or Neurosurg (if she feels the HAs are coming from the neck problems- they can prescribe the appropriate pain regimen)

## 2014-06-19 NOTE — Discharge Instructions (Signed)

## 2014-06-19 NOTE — Telephone Encounter (Signed)
Request to speak to nurse

## 2014-06-19 NOTE — Telephone Encounter (Signed)
C/o: HA for the past week.  She believes the pain is coming from her neck.  States she's having surgery in October (C3-4 C4-5 C5-6 C6-7 Anterior cervical decompression/diskectomy/fusion/interbody prosthesis/plate) and is unable to have her neck adjusted by her Chiropractor.  She has taken Imitrex as prescribed, but no relief.  She is currently at work.    Advice:  Pt was advised to follow up with neurologist.

## 2014-06-19 NOTE — Telephone Encounter (Signed)
Request to speak with nurse

## 2014-06-19 NOTE — ED Notes (Signed)
Reports migraine x 1 week pt states that she is preparing for neck surgery in 1 week and has been recently dx with MS. Pt reports light sensitivity, nausea started today and she has been unable to keep any food down, neuro intact

## 2014-06-21 ENCOUNTER — Telehealth: Payer: Self-pay | Admitting: Neurology

## 2014-06-21 ENCOUNTER — Telehealth: Payer: Self-pay | Admitting: *Deleted

## 2014-06-21 ENCOUNTER — Encounter: Payer: Self-pay | Admitting: *Deleted

## 2014-06-21 NOTE — Telephone Encounter (Signed)
Call a nurse called to state she was advising this patient to go to ER  due to headace and not being able to form words or get them out almost like she is unable to speak . drooping of the eye as well

## 2014-06-21 NOTE — Telephone Encounter (Signed)
Pt sister Anderson Malta called and would like to talk to someone please call [phone]

## 2014-06-21 NOTE — Telephone Encounter (Signed)
I spoke with pateint she was having difficulty getting words out  very scarred and crying . I advised her to go to [facility] .

## 2014-06-21 NOTE — Telephone Encounter (Signed)
I spoke with patient's sister who is listed as patient's contact  She states patient did go to [facility] was discharged around 2pm  After receiving IV cocktail for severe migraine and fluids for dehydration . Patient did get relief and is now sleeping . Without really being able to assess the patient as to when the headache occurred what she has been taking  And what was working for the patient she was advised per Dr Tomi Likens to take the Imitrex 100 mg with 500mg  naproxen at the earliest onset of headache and may repeat the Imitrex 100 mg again in 2 hours in needed without the naproxen. Not to take more than 2 Imitrex in 24 hours

## 2014-06-21 NOTE — Telephone Encounter (Signed)
I agree.  She should go to the ED immediately.

## 2014-06-24 ENCOUNTER — Ambulatory Visit (INDEPENDENT_AMBULATORY_CARE_PROVIDER_SITE_OTHER): Payer: 59 | Admitting: *Deleted

## 2014-06-24 ENCOUNTER — Encounter: Payer: Self-pay | Admitting: *Deleted

## 2014-06-24 ENCOUNTER — Telehealth: Payer: Self-pay | Admitting: Neurology

## 2014-06-24 DIAGNOSIS — G35 Multiple sclerosis: Secondary | ICD-10-CM | POA: Diagnosis not present

## 2014-06-24 MED ORDER — DIPHENHYDRAMINE HCL 50 MG/ML IJ SOLN
12.5000 mg | Freq: Once | INTRAMUSCULAR | Status: AC
  Administered 2014-06-24: 12.5 mg via INTRAMUSCULAR

## 2014-06-24 MED ORDER — METOCLOPRAMIDE HCL 5 MG/ML IJ SOLN: 10.0000 mg | Freq: Once | INTRAVENOUS | Status: DC

## 2014-06-24 MED ORDER — METOCLOPRAMIDE HCL 5 MG/ML IJ SOLN
10.0000 mg | Freq: Once | INTRAMUSCULAR | Status: AC
  Administered 2014-06-24: 10 mg via INTRAMUSCULAR

## 2014-06-24 MED ORDER — METOCLOPRAMIDE HCL 10 MG/10ML PO SOLN: 10.0000 mg | Freq: Once | ORAL | Status: DC

## 2014-06-24 MED ORDER — KETOROLAC TROMETHAMINE 60 MG/2ML IM SOLN
60.0000 mg | Freq: Once | INTRAMUSCULAR | Status: AC
  Administered 2014-06-24: 60 mg via INTRAMUSCULAR

## 2014-06-24 NOTE — Telephone Encounter (Signed)
chinera called and states that there is no change in the migraine and she is worried about the blood pressure please call Aneisha at  [facility] at [phone]

## 2014-06-24 NOTE — Telephone Encounter (Signed)
Patient called stating the the imitrex and naproxen are not helping with the headache she sleep most of the day Friday  But the rest of the weekend she has continued to have the headache she feel like her brain is bruised it hurts so bad . She does not want to go back to [facility] at [facility] was not a good experience .Please advise

## 2014-06-24 NOTE — Telephone Encounter (Signed)
She can come in for Toradol 60mg /Reglan 10mg /Benadryl 25mg  IM cocktail (have a driver with her).

## 2014-06-25 ENCOUNTER — Encounter (HOSPITAL_COMMUNITY): Payer: Self-pay | Admitting: Pharmacy Technician

## 2014-06-27 ENCOUNTER — Encounter (HOSPITAL_COMMUNITY): Payer: Self-pay

## 2014-06-27 ENCOUNTER — Encounter (HOSPITAL_COMMUNITY)
Admission: RE | Admit: 2014-06-27 | Discharge: 2014-06-27 | Disposition: A | Discharge status: Home / Self Care | Payer: 59 | Source: Ambulatory Visit | Attending: Neurosurgery | Admitting: Neurosurgery

## 2014-06-27 ENCOUNTER — Ambulatory Visit (HOSPITAL_COMMUNITY)
Admission: RE | Admit: 2014-06-27 | Discharge: 2014-06-27 | Disposition: A | Discharge status: Home / Self Care | Payer: 59 | Source: Ambulatory Visit | Attending: Anesthesiology | Admitting: Anesthesiology

## 2014-06-27 DIAGNOSIS — R05 Cough: Secondary | ICD-10-CM | POA: Insufficient documentation

## 2014-06-27 DIAGNOSIS — G35 Multiple sclerosis: Secondary | ICD-10-CM | POA: Diagnosis not present

## 2014-06-27 DIAGNOSIS — M4712 Other spondylosis with myelopathy, cervical region: Secondary | ICD-10-CM | POA: Diagnosis not present

## 2014-06-27 DIAGNOSIS — Z01818 Encounter for other preprocedural examination: Secondary | ICD-10-CM | POA: Diagnosis present

## 2014-06-27 LAB — CBC
HCT: 38.3 % (ref 36.0–46.0)
Hemoglobin: 13 g/dL (ref 12.0–15.0)
MCH: 31.5 pg (ref 26.0–34.0)
MCHC: 33.9 g/dL (ref 30.0–36.0)
MCV: 92.7 fL (ref 78.0–100.0)
Platelets: 191 10*3/uL (ref 150–400)
RBC: 4.13 MIL/uL (ref 3.87–5.11)
RDW: 13.7 % (ref 11.5–15.5)
WBC: 8.9 10*3/uL (ref 4.0–10.5)

## 2014-06-27 LAB — BASIC METABOLIC PANEL
ANION GAP: 11 (ref 5–15)
BUN: 18 mg/dL (ref 6–23)
CO2: 26 meq/L (ref 19–32)
CREATININE: 0.66 mg/dL (ref 0.50–1.10)
Calcium: 9.4 mg/dL (ref 8.4–10.5)
Chloride: 100 mEq/L (ref 96–112)
GFR calc Af Amer: >90 mL/min (ref >90)
GFR calc non Af Amer: >90 mL/min (ref >90)
Glucose, Bld: 92 mg/dL (ref 70–99)
Potassium: 4.1 mEq/L (ref 3.7–5.3)
Sodium: 137 mEq/L (ref 137–147)

## 2014-06-27 LAB — SURGICAL PCR SCREEN
MRSA, PCR: NEGATIVE
STAPHYLOCOCCUS AUREUS: NEGATIVE

## 2014-06-27 NOTE — Pre-Procedure Instructions (Signed)
Stacy Logan  06/27/2014   Your procedure is scheduled on:  Wednesday October 14 th at 0830 AM  Report to [place] at 0630 AM.  Call this number if you have problems the morning of surgery: [phone]   Remember:   Do not eat food or drink liquids after midnight.   Take these medicines the morning of surgery with A SIP OF WATER: Xanax, and Imitrex if needed Stop Naproxen  Do not wear jewelry, make-up or nail polish.  Do not wear lotions, powders, or perfumes. You may wear deodorant.  Do not shave 48 hours prior to surgery.   Do not bring valuables to the hospital.  [facility] is not responsible   for any belongings or valuables.               Contacts, dentures or bridgework may not be worn into surgery.  Leave suitcase in the car. After surgery it may be brought to your room.  For patients admitted to the hospital, discharge time is determined by your  treatment team.               Patients discharged the day of surgery will not be allowed to drive home.    Special Instructions: [facility] - Preparing for Surgery  Before surgery, you can play an important role.  Because skin is not sterile, your skin needs to be as free of germs as possible.  You can reduce the number of germs on you skin by washing with CHG (chlorahexidine gluconate) soap before surgery.  CHG is an antiseptic cleaner which kills germs and bonds with the skin to continue killing germs even after washing.  Please DO NOT use if you have an allergy to CHG or antibacterial soaps.  If your skin becomes reddened/irritated stop using the CHG and inform your nurse when you arrive at Short Stay.  Do not shave (including legs and underarms) for at least 48 hours prior to the first CHG shower.  You may shave your face.  Please follow these instructions carefully:   1.  Shower with CHG Soap the night before surgery and the                                morning of Surgery.  2.  If you choose to  wash your hair, wash your hair first as usual with your       normal shampoo.  3.  After you shampoo, rinse your hair and body thoroughly to remove the                      Shampoo.  4.  Use CHG as you would any other liquid soap.  You can apply chg directly       to the skin and wash gently with scrungie or a clean washcloth.  5.  Apply the CHG Soap to your body ONLY FROM THE NECK DOWN.        Do not use on open wounds or open sores.  Avoid contact with your eyes,       ears, mouth and genitals (private parts).  Wash genitals (private parts)       with your normal soap.  6.  Wash thoroughly, paying special attention to the area where your surgery        will be performed.  7.  Thoroughly rinse your body  with warm water from the neck down.  8.  DO NOT shower/wash with your normal soap after using and rinsing off       the CHG Soap.  9.  Pat yourself dry with a clean towel.            10.  Wear clean pajamas.            11.  Place clean sheets on your bed the night of your first shower and d     Day of Surgery  Do not apply any lotions/deoderants the morning of surgery.  Please wear clean clothes to the hospital/surgery center.

## 2014-07-02 MED ORDER — CEFAZOLIN SODIUM-DEXTROSE 2-3 GM-% IV SOLR
2.0000 g | INTRAVENOUS | Status: AC
  Administered 2014-07-03 (×2): 2 g via INTRAVENOUS
  Filled 2014-07-02: qty 50

## 2014-07-03 ENCOUNTER — Inpatient Hospital Stay (HOSPITAL_COMMUNITY)
Admission: RE | Admit: 2014-07-03 | Discharge: 2014-07-05 | DRG: 472 | Disposition: A | Discharge status: Home / Self Care | Payer: 59 | Source: Ambulatory Visit | Attending: Neurosurgery | Admitting: Neurosurgery

## 2014-07-03 ENCOUNTER — Encounter (HOSPITAL_COMMUNITY): Payer: Self-pay | Admitting: Certified Registered"

## 2014-07-03 ENCOUNTER — Encounter (HOSPITAL_COMMUNITY): Payer: 59 | Admitting: Certified Registered"

## 2014-07-03 ENCOUNTER — Encounter (HOSPITAL_COMMUNITY)
Admission: RE | Disposition: A | Discharge status: Home / Self Care | Payer: Self-pay | Source: Ambulatory Visit | Attending: Neurosurgery

## 2014-07-03 ENCOUNTER — Inpatient Hospital Stay (HOSPITAL_COMMUNITY): Payer: 59 | Admitting: Certified Registered"

## 2014-07-03 ENCOUNTER — Inpatient Hospital Stay (HOSPITAL_COMMUNITY): Payer: 59

## 2014-07-03 DIAGNOSIS — F329 Major depressive disorder, single episode, unspecified: Secondary | ICD-10-CM | POA: Diagnosis present

## 2014-07-03 DIAGNOSIS — F419 Anxiety disorder, unspecified: Secondary | ICD-10-CM | POA: Diagnosis present

## 2014-07-03 DIAGNOSIS — M47812 Spondylosis without myelopathy or radiculopathy, cervical region: Secondary | ICD-10-CM

## 2014-07-03 DIAGNOSIS — M5001 Cervical disc disorder with myelopathy,  high cervical region: Secondary | ICD-10-CM | POA: Diagnosis present

## 2014-07-03 DIAGNOSIS — G35 Multiple sclerosis: Secondary | ICD-10-CM | POA: Diagnosis present

## 2014-07-03 DIAGNOSIS — M4712 Other spondylosis with myelopathy, cervical region: Secondary | ICD-10-CM | POA: Diagnosis present

## 2014-07-03 DIAGNOSIS — Z87891 Personal history of nicotine dependence: Secondary | ICD-10-CM

## 2014-07-03 DIAGNOSIS — Z79899 Other long term (current) drug therapy: Secondary | ICD-10-CM

## 2014-07-03 DIAGNOSIS — R200 Anesthesia of skin: Secondary | ICD-10-CM | POA: Diagnosis present

## 2014-07-03 DIAGNOSIS — M5002 Cervical disc disorder with myelopathy, mid-cervical region: Secondary | ICD-10-CM | POA: Diagnosis present

## 2014-07-03 DIAGNOSIS — M4722 Other spondylosis with radiculopathy, cervical region: Secondary | ICD-10-CM | POA: Diagnosis present

## 2014-07-03 HISTORY — PX: ANTERIOR CERVICAL DECOMPRESSION/DISCECTOMY FUSION 4 LEVELS: SHX5556

## 2014-07-03 SURGERY — ANTERIOR CERVICAL DECOMPRESSION/DISCECTOMY FUSION 4 LEVELS
Anesthesia: General | Site: Neck

## 2014-07-03 MED ORDER — KETOROLAC TROMETHAMINE 30 MG/ML IJ SOLN: 15.0000 mg | Freq: Once | INTRAMUSCULAR | Status: DC | PRN

## 2014-07-03 MED ORDER — ALUM & MAG HYDROXIDE-SIMETH 200-200-20 MG/5ML PO SUSP: 30.0000 mL | Freq: Four times a day (QID) | ORAL | Status: DC | PRN

## 2014-07-03 MED ORDER — FENTANYL CITRATE 0.05 MG/ML IJ SOLN
INTRAMUSCULAR | Status: AC
  Filled 2014-07-03: qty 5

## 2014-07-03 MED ORDER — GLYCOPYRROLATE 0.2 MG/ML IJ SOLN
INTRAMUSCULAR | Status: DC | PRN
  Administered 2014-07-03: .4 mg via INTRAVENOUS

## 2014-07-03 MED ORDER — MEPERIDINE HCL 25 MG/ML IJ SOLN: 6.2500 mg | INTRAMUSCULAR | Status: DC | PRN

## 2014-07-03 MED ORDER — PROPOFOL 10 MG/ML IV BOLUS
INTRAVENOUS | Status: AC
  Filled 2014-07-03: qty 20

## 2014-07-03 MED ORDER — PHENOL 1.4 % MT LIQD: 1.0000 | OROMUCOSAL | Status: DC | PRN

## 2014-07-03 MED ORDER — INFLUENZA VAC SPLIT QUAD 0.5 ML IM SUSY
0.5000 mL | PREFILLED_SYRINGE | INTRAMUSCULAR | Status: DC
  Filled 2014-07-03: qty 0.5

## 2014-07-03 MED ORDER — FENTANYL CITRATE 0.05 MG/ML IJ SOLN
INTRAMUSCULAR | Status: DC | PRN
  Administered 2014-07-03: 25 ug via INTRAVENOUS
  Administered 2014-07-03 (×2): 50 ug via INTRAVENOUS
  Administered 2014-07-03: 25 ug via INTRAVENOUS
  Administered 2014-07-03 (×5): 50 ug via INTRAVENOUS
  Administered 2014-07-03: 25 ug via INTRAVENOUS
  Administered 2014-07-03 (×3): 50 ug via INTRAVENOUS
  Administered 2014-07-03: 25 ug via INTRAVENOUS

## 2014-07-03 MED ORDER — ROCURONIUM BROMIDE 100 MG/10ML IV SOLN
INTRAVENOUS | Status: DC | PRN
  Administered 2014-07-03 (×3): 10 mg via INTRAVENOUS
  Administered 2014-07-03: 40 mg via INTRAVENOUS
  Administered 2014-07-03 (×4): 10 mg via INTRAVENOUS

## 2014-07-03 MED ORDER — FENTANYL CITRATE 0.05 MG/ML IJ SOLN: 25.0000 ug | INTRAMUSCULAR | Status: DC | PRN

## 2014-07-03 MED ORDER — DEXAMETHASONE SODIUM PHOSPHATE 4 MG/ML IJ SOLN
4.0000 mg | Freq: Four times a day (QID) | INTRAMUSCULAR | Status: AC
  Administered 2014-07-03: 4 mg via INTRAVENOUS
  Filled 2014-07-03 (×4): qty 1

## 2014-07-03 MED ORDER — ONDANSETRON HCL 4 MG/2ML IJ SOLN
INTRAMUSCULAR | Status: DC | PRN
  Administered 2014-07-03: 4 mg via INTRAVENOUS

## 2014-07-03 MED ORDER — NEOSTIGMINE METHYLSULFATE 10 MG/10ML IV SOLN
INTRAVENOUS | Status: DC | PRN
  Administered 2014-07-03: 3 mg via INTRAVENOUS

## 2014-07-03 MED ORDER — THROMBIN 20000 UNITS EX SOLR
CUTANEOUS | Status: DC | PRN
  Administered 2014-07-03 (×2): via TOPICAL

## 2014-07-03 MED ORDER — LIDOCAINE HCL (CARDIAC) 20 MG/ML IV SOLN
INTRAVENOUS | Status: AC
  Filled 2014-07-03: qty 5

## 2014-07-03 MED ORDER — ONDANSETRON HCL 4 MG/2ML IJ SOLN
4.0000 mg | INTRAMUSCULAR | Status: DC | PRN
  Administered 2014-07-03 – 2014-07-04 (×2): 4 mg via INTRAVENOUS
  Filled 2014-07-03 (×2): qty 2

## 2014-07-03 MED ORDER — DULOXETINE HCL 20 MG PO CPEP
40.0000 mg | ORAL_CAPSULE | Freq: Every day | ORAL | Status: DC
  Administered 2014-07-03 – 2014-07-04 (×2): 40 mg via ORAL
  Filled 2014-07-03 (×3): qty 2

## 2014-07-03 MED ORDER — OXYCODONE-ACETAMINOPHEN 5-325 MG PO TABS
1.0000 | ORAL_TABLET | ORAL | Status: DC | PRN
  Administered 2014-07-04 (×2): 1 via ORAL
  Filled 2014-07-03 (×2): qty 1

## 2014-07-03 MED ORDER — VALACYCLOVIR HCL 500 MG PO TABS: 2000.0000 mg | ORAL_TABLET | ORAL | Status: DC

## 2014-07-03 MED ORDER — HYDROCODONE-ACETAMINOPHEN 5-325 MG PO TABS
1.0000 | ORAL_TABLET | ORAL | Status: DC | PRN
  Administered 2014-07-03: 2 via ORAL
  Administered 2014-07-04: 1 via ORAL
  Filled 2014-07-03: qty 1
  Filled 2014-07-03: qty 2

## 2014-07-03 MED ORDER — MENTHOL 3 MG MT LOZG
1.0000 | LOZENGE | OROMUCOSAL | Status: DC | PRN
  Filled 2014-07-03: qty 9

## 2014-07-03 MED ORDER — SODIUM CHLORIDE 0.9 % IR SOLN
Status: DC | PRN
  Administered 2014-07-03: 09:00:00

## 2014-07-03 MED ORDER — NEOSTIGMINE METHYLSULFATE 10 MG/10ML IV SOLN
INTRAVENOUS | Status: AC
  Filled 2014-07-03: qty 1

## 2014-07-03 MED ORDER — LACTATED RINGERS IV SOLN
INTRAVENOUS | Status: DC | PRN
  Administered 2014-07-03 (×3): via INTRAVENOUS

## 2014-07-03 MED ORDER — MIDAZOLAM HCL 2 MG/2ML IJ SOLN
INTRAMUSCULAR | Status: AC
  Filled 2014-07-03: qty 2

## 2014-07-03 MED ORDER — ROCURONIUM BROMIDE 50 MG/5ML IV SOLN
INTRAVENOUS | Status: AC
  Filled 2014-07-03: qty 1

## 2014-07-03 MED ORDER — MIDAZOLAM HCL 5 MG/5ML IJ SOLN
INTRAMUSCULAR | Status: DC | PRN
  Administered 2014-07-03 (×2): 1 mg via INTRAVENOUS

## 2014-07-03 MED ORDER — DOCUSATE SODIUM 100 MG PO CAPS
100.0000 mg | ORAL_CAPSULE | Freq: Two times a day (BID) | ORAL | Status: DC
  Administered 2014-07-03 – 2014-07-04 (×3): 100 mg via ORAL
  Filled 2014-07-03 (×5): qty 1

## 2014-07-03 MED ORDER — GLYCOPYRROLATE 0.2 MG/ML IJ SOLN
INTRAMUSCULAR | Status: AC
  Filled 2014-07-03: qty 3

## 2014-07-03 MED ORDER — BACITRACIN ZINC 500 UNIT/GM EX OINT
TOPICAL_OINTMENT | CUTANEOUS | Status: DC | PRN
  Administered 2014-07-03: 1 via TOPICAL

## 2014-07-03 MED ORDER — GELATIN ABSORBABLE MT POWD
OROMUCOSAL | Status: DC | PRN
  Administered 2014-07-03: 10:00:00 via TOPICAL

## 2014-07-03 MED ORDER — BUPIVACAINE-EPINEPHRINE (PF) 0.5% -1:200000 IJ SOLN
INTRAMUSCULAR | Status: DC | PRN
  Administered 2014-07-03: 10 mL via PERINEURAL

## 2014-07-03 MED ORDER — ONDANSETRON HCL 4 MG/2ML IJ SOLN
INTRAMUSCULAR | Status: AC
  Filled 2014-07-03: qty 2

## 2014-07-03 MED ORDER — CEFAZOLIN SODIUM-DEXTROSE 2-3 GM-% IV SOLR
2.0000 g | Freq: Three times a day (TID) | INTRAVENOUS | Status: AC
  Administered 2014-07-03 – 2014-07-04 (×2): 2 g via INTRAVENOUS
  Filled 2014-07-03 (×2): qty 50

## 2014-07-03 MED ORDER — NAPHAZOLINE HCL 0.1 % OP SOLN
1.0000 [drp] | Freq: Four times a day (QID) | OPHTHALMIC | Status: DC | PRN
  Filled 2014-07-03: qty 15

## 2014-07-03 MED ORDER — LIDOCAINE HCL (CARDIAC) 20 MG/ML IV SOLN
INTRAVENOUS | Status: DC | PRN
  Administered 2014-07-03: 60 mg via INTRAVENOUS

## 2014-07-03 MED ORDER — MIDAZOLAM HCL 2 MG/2ML IJ SOLN: 0.5000 mg | Freq: Once | INTRAMUSCULAR | Status: DC | PRN

## 2014-07-03 MED ORDER — PROPOFOL 10 MG/ML IV BOLUS
INTRAVENOUS | Status: DC | PRN
  Administered 2014-07-03: 150 mg via INTRAVENOUS

## 2014-07-03 MED ORDER — ACETAMINOPHEN 325 MG PO TABS: 650.0000 mg | ORAL_TABLET | ORAL | Status: DC | PRN

## 2014-07-03 MED ORDER — PANTOPRAZOLE SODIUM 40 MG IV SOLR
40.0000 mg | Freq: Every day | INTRAVENOUS | Status: DC
  Administered 2014-07-03: 40 mg via INTRAVENOUS
  Filled 2014-07-03 (×3): qty 40

## 2014-07-03 MED ORDER — DEXAMETHASONE 4 MG PO TABS
4.0000 mg | ORAL_TABLET | Freq: Four times a day (QID) | ORAL | Status: AC
  Administered 2014-07-03 – 2014-07-04 (×2): 4 mg via ORAL
  Filled 2014-07-03 (×4): qty 1

## 2014-07-03 MED ORDER — ACETAMINOPHEN 650 MG RE SUPP
650.0000 mg | RECTAL | Status: DC | PRN
Start: 2014-07-03 — End: 2014-07-05

## 2014-07-03 MED ORDER — SUMATRIPTAN SUCCINATE 100 MG PO TABS
100.0000 mg | ORAL_TABLET | ORAL | Status: DC | PRN
  Filled 2014-07-03: qty 1

## 2014-07-03 MED ORDER — AMITRIPTYLINE HCL 50 MG PO TABS
50.0000 mg | ORAL_TABLET | Freq: Every day | ORAL | Status: DC
  Administered 2014-07-03 – 2014-07-04 (×2): 50 mg via ORAL
  Filled 2014-07-03 (×3): qty 1

## 2014-07-03 MED ORDER — LACTATED RINGERS IV SOLN: INTRAVENOUS | Status: DC

## 2014-07-03 MED ORDER — SODIUM CHLORIDE 0.9 % IV SOLN
10.0000 mg | INTRAVENOUS | Status: DC | PRN
  Administered 2014-07-03: 5 ug/min via INTRAVENOUS

## 2014-07-03 MED ORDER — 0.9 % SODIUM CHLORIDE (POUR BTL) OPTIME
TOPICAL | Status: DC | PRN
  Administered 2014-07-03: 1000 mL

## 2014-07-03 MED ORDER — FESOTERODINE FUMARATE ER 8 MG PO TB24
8.0000 mg | ORAL_TABLET | Freq: Every day | ORAL | Status: DC
  Administered 2014-07-03 – 2014-07-04 (×2): 8 mg via ORAL
  Filled 2014-07-03 (×3): qty 1

## 2014-07-03 MED ORDER — PROMETHAZINE HCL 25 MG/ML IJ SOLN: 6.2500 mg | INTRAMUSCULAR | Status: DC | PRN

## 2014-07-03 MED ORDER — HYDROMORPHONE HCL 1 MG/ML IJ SOLN
1.0000 mg | INTRAMUSCULAR | Status: DC | PRN
  Administered 2014-07-03 – 2014-07-04 (×3): 1 mg via INTRAVENOUS
  Filled 2014-07-03 (×3): qty 1

## 2014-07-03 MED ORDER — DIAZEPAM 5 MG PO TABS
5.0000 mg | ORAL_TABLET | Freq: Four times a day (QID) | ORAL | Status: DC | PRN
  Administered 2014-07-04: 5 mg via ORAL
  Filled 2014-07-03: qty 1

## 2014-07-03 MED ORDER — DEXAMETHASONE SODIUM PHOSPHATE 10 MG/ML IJ SOLN
INTRAMUSCULAR | Status: DC | PRN
  Administered 2014-07-03: 10 mg via INTRAVENOUS

## 2014-07-03 MED ORDER — HYDROMORPHONE HCL 2 MG PO TABS: 4.0000 mg | ORAL_TABLET | ORAL | Status: DC | PRN

## 2014-07-03 SURGICAL SUPPLY — 71 items
APL SKNCLS STERI-STRIP NONHPOA (GAUZE/BANDAGES/DRESSINGS) ×1
BAG DECANTER FOR FLEXI CONT (MISCELLANEOUS) ×2 IMPLANT
BENZOIN TINCTURE PRP APPL 2/3 (GAUZE/BANDAGES/DRESSINGS) ×3 IMPLANT
BIT DRILL NEURO 2X3.1 SFT TUCH (MISCELLANEOUS) ×1 IMPLANT
BLADE SURG 15 STRL LF DISP TIS (BLADE) ×1 IMPLANT
BLADE SURG 15 STRL SS (BLADE) ×2
BLADE ULTRA TIP 2M (BLADE) ×2 IMPLANT
BRUSH SCRUB EZ PLAIN DRY (MISCELLANEOUS) ×2 IMPLANT
BUR BARREL STRAIGHT FLUTE 4.0 (BURR) ×2 IMPLANT
BUR MATCHSTICK NEURO 3.0X3.8 (BURR) ×1 IMPLANT
CANISTER SUCT 3000ML (MISCELLANEOUS) ×2 IMPLANT
CONT SPEC 4OZ CLIKSEAL STRL BL (MISCELLANEOUS) ×2 IMPLANT
COVER MAYO STAND STRL (DRAPES) ×2 IMPLANT
DEVICE FUSION VIST S 14X14X6MM (Trauma) IMPLANT
DRAIN CHANNEL 10M FLAT 3/4 FLT (DRAIN) IMPLANT
DRAIN JACKSON PRATT 10MM FLAT (MISCELLANEOUS) ×1 IMPLANT
DRAPE LAPAROTOMY 100X72 PEDS (DRAPES) ×2 IMPLANT
DRAPE MICROSCOPE LEICA (MISCELLANEOUS) IMPLANT
DRAPE POUCH INSTRU U-SHP 10X18 (DRAPES) ×2 IMPLANT
DRAPE SURG 17X23 STRL (DRAPES) ×4 IMPLANT
DRILL NEURO 2X3.1 SOFT TOUCH (MISCELLANEOUS) ×2
ELECT REM PT RETURN 9FT ADLT (ELECTROSURGICAL) ×2
ELECTRODE REM PT RTRN 9FT ADLT (ELECTROSURGICAL) ×1 IMPLANT
EVACUATOR SILICONE 100CC (DRAIN) ×1 IMPLANT
GAUZE SPONGE 4X4 12PLY STRL (GAUZE/BANDAGES/DRESSINGS) ×2 IMPLANT
GAUZE SPONGE 4X4 16PLY XRAY LF (GAUZE/BANDAGES/DRESSINGS) ×1 IMPLANT
GLOVE BIO SURGEON STRL SZ8 (GLOVE) ×1 IMPLANT
GLOVE BIO SURGEON STRL SZ8.5 (GLOVE) ×2 IMPLANT
GLOVE BIOGEL PI IND STRL 8 (GLOVE) IMPLANT
GLOVE BIOGEL PI INDICATOR 8 (GLOVE) ×1
GLOVE ECLIPSE 7.5 STRL STRAW (GLOVE) ×3 IMPLANT
GLOVE EXAM NITRILE LRG STRL (GLOVE) IMPLANT
GLOVE EXAM NITRILE MD LF STRL (GLOVE) IMPLANT
GLOVE EXAM NITRILE XL STR (GLOVE) IMPLANT
GLOVE EXAM NITRILE XS STR PU (GLOVE) IMPLANT
GLOVE SS BIOGEL STRL SZ 8 (GLOVE) ×1 IMPLANT
GLOVE SUPERSENSE BIOGEL SZ 8 (GLOVE) ×1
GOWN STRL REUS W/ TWL LRG LVL3 (GOWN DISPOSABLE) IMPLANT
GOWN STRL REUS W/ TWL XL LVL3 (GOWN DISPOSABLE) IMPLANT
GOWN STRL REUS W/TWL LRG LVL3 (GOWN DISPOSABLE)
GOWN STRL REUS W/TWL XL LVL3 (GOWN DISPOSABLE) ×6
HEMOSTAT POWDER SURGIFOAM 1G (HEMOSTASIS) ×1 IMPLANT
KIT BASIN OR (CUSTOM PROCEDURE TRAY) ×2 IMPLANT
KIT ROOM TURNOVER OR (KITS) ×2 IMPLANT
MARKER SKIN DUAL TIP RULER LAB (MISCELLANEOUS) ×2 IMPLANT
NDL SPNL 18GX3.5 QUINCKE PK (NEEDLE) ×1 IMPLANT
NEEDLE HYPO 22GX1.5 SAFETY (NEEDLE) ×2 IMPLANT
NEEDLE SPNL 18GX3.5 QUINCKE PK (NEEDLE) ×2 IMPLANT
NS IRRIG 1000ML POUR BTL (IV SOLUTION) ×2 IMPLANT
PACK LAMINECTOMY NEURO (CUSTOM PROCEDURE TRAY) ×2 IMPLANT
PATTIES SURGICAL .5 X.5 (GAUZE/BANDAGES/DRESSINGS) ×2 IMPLANT
PATTIES SURGICAL 1X1 (DISPOSABLE) ×2 IMPLANT
PEEK VISTA 14X14X7MM (Peek) ×2 IMPLANT
PIN DISTRACTION 14MM (PIN) ×4 IMPLANT
PLATE ANT CERV XTEND 4 LV 66 (Plate) ×1 IMPLANT
PUTTY BIOACTIVE 10CC KINEX (Putty) ×1 IMPLANT
RUBBERBAND STERILE (MISCELLANEOUS) IMPLANT
SCREW XTD VAR 4.2 SELF TAP 12 (Screw) ×10 IMPLANT
SPONGE INTESTINAL PEANUT (DISPOSABLE) ×4 IMPLANT
SPONGE SURGIFOAM ABS GEL 100 (HEMOSTASIS) ×3 IMPLANT
STRIP CLOSURE SKIN 1/2X4 (GAUZE/BANDAGES/DRESSINGS) ×2 IMPLANT
SUT VIC AB 0 CT1 27 (SUTURE) ×2
SUT VIC AB 0 CT1 27XBRD ANTBC (SUTURE) ×1 IMPLANT
SUT VIC AB 3-0 SH 8-18 (SUTURE) ×2 IMPLANT
SYR 20ML ECCENTRIC (SYRINGE) ×2 IMPLANT
TAPE CLOTH SURG 4X10 WHT LF (GAUZE/BANDAGES/DRESSINGS) ×1 IMPLANT
TAPE STRIPS DRAPE STRL (GAUZE/BANDAGES/DRESSINGS) ×1 IMPLANT
TOWEL OR 17X24 6PK STRL BLUE (TOWEL DISPOSABLE) ×2 IMPLANT
TOWEL OR 17X26 10 PK STRL BLUE (TOWEL DISPOSABLE) ×2 IMPLANT
VISTA S O 14X14X6MM (Trauma) ×4 IMPLANT
WATER STERILE IRR 1000ML POUR (IV SOLUTION) ×2 IMPLANT

## 2014-07-03 NOTE — Anesthesia Preprocedure Evaluation (Addendum)
Anesthesia Evaluation  Patient identified by MRN, date of birth, ID band Patient awake    Reviewed: Allergy & Precautions, H&P , Patient's Chart, lab work & pertinent test results, reviewed documented beta blocker date and time   History of Anesthesia Complications Negative for: history of anesthetic complications  Airway Mallampati: II TM Distance: >3 FB Neck ROM: full    Dental  (+) Dental Advisory Given, Chipped Irregular surfaces on front teeth:   Pulmonary former smoker,  breath sounds clear to auscultation        Cardiovascular Exercise Tolerance: Good hypertension, Rhythm:regular Rate:Normal     Neuro/Psych    GI/Hepatic   Endo/Other    Renal/GU      Musculoskeletal   Abdominal   Peds  Hematology   Anesthesia Other Findings   Reproductive/Obstetrics                          Anesthesia Physical Anesthesia Plan  ASA: II  Anesthesia Plan: General ETT   Post-op Pain Management:    Induction:   Airway Management Planned:   Additional Equipment:   Intra-op Plan:   Post-operative Plan:   Informed Consent: I have reviewed the patients History and Physical, chart, labs and discussed the procedure including the risks, benefits and alternatives for the proposed anesthesia with the patient or authorized representative who has indicated his/her understanding and acceptance.   Dental Advisory Given  Plan Discussed with: CRNA and Surgeon  Anesthesia Plan Comments:         Anesthesia Quick Evaluation

## 2014-07-03 NOTE — Progress Notes (Signed)
Pt arrived on unit accompanied by 2 PACU RNs. Pt oriented to room/unit, VS stable.

## 2014-07-03 NOTE — Op Note (Signed)
Brief history: The patient is a 48 year old white female with a history of multiple sclerosis who has presented with worsening neck pain, bilateral arm pain numbness tingling, gait ataxia, etc. She was worked up with a cervical MRI which demonstrated significant stenosis at multiple levels. I discussed the various treatment option with the patient including surgery. She has weighed the risks, benefits, and alternatives surgery and decided proceed with a C3-4, C4-5, C5-6 and C6-7 anterior cervical discectomy, fusion, plating.  Preoperative diagnosis: C3-4, C4-5, C5-6 and C6-7 disc degeneration, spondylosis, stenosis, cervical myelopathy, cervical radiculopathy, cervicalgia  Postoperative diagnosis: Same  Procedure: C3-4, C4-5, C5-C6 and C6-7 Anterior cervical discectomy/decompression; C3-4, C4-5, C5-6 and C6-7 interbody arthrodesis with local morcellized autograft bone and Kinnex bone graft extender; insertion of interbody prosthesis at C3-4, C4-5, C5-6 and C6-7 (Zimmer peek interbody prosthesis); anterior cervical plating from C3-C7 with globus titanium plate  Surgeon: Dr. Earle Gell  Asst.: Dr. Sherley Bounds  Anesthesia: Gen. endotracheal  Estimated blood loss: 200 cc  Drains: One 10 mm flat Jackson-Pratt drain in the prevertebral space.  Complications: None  Description of procedure: The patient was brought to the operating room by the anesthesia team. General endotracheal anesthesia was induced. A roll was placed under the patient's shoulders to keep the neck in the neutral position. The patient's anterior cervical region was then prepared with Betadine scrub and Betadine solution. Sterile drapes were applied.  The area to be incised was then injected with Marcaine with epinephrine solution. I then used a scalpel to make a transverse incision in the patient's left anterior neck. I used the Metzenbaum scissors to divide the platysmal muscle and then to dissect medial to the  sternocleidomastoid muscle, jugular vein, and carotid artery. I carefully dissected down towards the anterior cervical spine identifying the esophagus and retracting it medially. Then using Kitner swabs to clear soft tissue from the anterior cervical spine. We then inserted a bent spinal needle into the upper exposed intervertebral disc space. We then obtained intraoperative radiographs confirm our location.  I then used electrocautery to detach the medial border of the longus colli muscle bilaterally from the C3-4, C4-5, C5-6 and C6-7 intervertebral disc spaces. I then inserted the Caspar self-retaining retractor underneath the longus colli muscle bilaterally to provide exposure.  We then incised the intervertebral disc at C5-6. We then performed a partial intervertebral discectomy with a pituitary forceps and the Karlin curettes. I then inserted distraction screws into the vertebral bodies at C5-6. We then distracted the interspace. We then used the high-speed drill to decorticate the vertebral endplates at N8-2, to drill away the remainder of the intervertebral disc, to drill away some posterior spondylosis, and to thin out the posterior longitudinal ligament. I then incised ligament with the arachnoid knife. We then removed the ligament with a Kerrison punches undercutting the vertebral endplates and decompressing the thecal sac. We then performed foraminotomies about the bilateral C6 nerve roots. This completed the decompression at this level.  Then repeated this procedure in an analogous fashion at C3-4, C4-5 and C6-7 decompressing the thecal sac at these levels as well as a bilateral C4, C5, and C7 nerve roots.  We now turned our to attention to the interbody fusion. We used the trial spacers to determine the appropriate size for the interbody prosthesis. We then pre-filled prosthesis with a combination of local morcellized autograft bone that we obtained during decompression as well as Kinnex bone  graft extender. We then inserted the prosthesis into the distracted interspace at  C3-4, C4-5, C5-6 and C6-7. We then removed the distraction screws. There was a good snug fit of the prosthesis in the interspace.  Having completed the fusion we now turned attention to the anterior spinal instrumentation. We used the high-speed drill to drill away some anterior spondylosis at the disc spaces so that the plate lay down flat. We selected the appropriate length titanium anterior cervical plate. We laid it along the anterior aspect of the vertebral bodies from C3-C7. We then drilled 12 mm holes at C3, C4, C5, C6 and C7. We then secured the plate to the vertebral bodies by placing two 12 mm self-tapping screws at C3, C4, C5, C6 and C7. We then obtained intraoperative radiograph. The demonstrating good position of the instrumentation. We therefore secured the screws the plate the locking each cam. This completed the instrumentation.  We then obtained hemostasis using bipolar electrocautery. We irrigated the wound out with bacitracin solution. We then removed the retractor. We inspected the esophagus for any damage. There was none apparent. I placed a 10 mm flat Jackson-Pratt drain in the prevertebral space. I tunneled it out through separate stab wound. We then reapproximated patient's platysmal muscle with interrupted 3-0 Vicryl suture. We then reapproximated the subcutaneous tissue with interrupted 3-0 Vicryl suture. The skin was reapproximated with Steri-Strips and benzoin. The wound was then covered with bacitracin ointment. A sterile dressing was applied. The drapes were removed. Patient was subsequently extubated by the anesthesia team and transported to the post anesthesia care unit in stable condition. All sponge instrument and needle counts were reportedly correct at the end of this case.

## 2014-07-03 NOTE — Progress Notes (Signed)
Orthopedic Tech Progress Note Patient Details:  [name] [birth year] [id number] Called in order to [employer]. Patient ID: Stacy Logan, female   DOB: 1966-07-24, 48 y.o.   MRN: [id number]   Darrol Poke 07/03/2014, 3:42 PM

## 2014-07-03 NOTE — Addendum Note (Signed)
Addendum created 07/03/14 1404 by Octavio Graves   Modules edited: Anesthesia Flowsheet

## 2014-07-03 NOTE — Progress Notes (Signed)
Subjective:  The patient is somnolent but arousable. She is in no apparent distress  Objective: Vital signs in last 24 hours: Temp:  [97 F (36.1 C)-99.3 F (37.4 C)] 99.3 F (37.4 C) (10/14 1300) Pulse Rate:  [74-97] 93 (10/14 1315) Resp:  [18-20] 20 (10/14 1315) BP: (119-128)/(68-70) 128/68 mmHg (10/14 1308) SpO2:  [97 %-99 %] 98 % (10/14 1315) Weight:  [53.071 kg (117 lb)] 53.071 kg (117 lb) (10/14 0738)  Intake/Output from previous day:   Intake/Output this shift: Total I/O In: 2400 [I.V.:2400] Out: 900 [Urine:650; Blood:250]  Physical exam the patient is somnolent but arousable. She is moving all 4 extremities. Her dressing is clean and dry. There is no evidence of hematoma or shift.  Lab Results: No results found for this basename: WBC, HGB, HCT, PLT,  in the last 72 hours BMET No results found for this basename: NA, K, CL, CO2, GLUCOSE, BUN, CREATININE, CALCIUM,  in the last 72 hours  Studies/Results: Dg Cervical Spine 2-3 Views  07/03/2014   CLINICAL DATA:  C3 through C7 ACDF.  EXAM: CERVICAL SPINE - 2-3 VIEW  COMPARISON:  MRI 05/22/2014.  FINDINGS: Two lateral radiographs are submitted for interpretation. C3 through C7 ACDF is present. Endotracheal tube is present. Surgical sponges are present in the prevertebral soft tissues.  IMPRESSION: C3 through C7 ACDF.   Electronically Signed   By: Dereck Ligas M.D.   On: 07/03/2014 13:09    Assessment/Plan: The patient is doing well.  LOS: 0 days     Avika Carbine D 07/03/2014, 1:21 PM

## 2014-07-03 NOTE — Transfer of Care (Signed)
Immediate Anesthesia Transfer of Care Note  Patient: CAMIYA VINAL  Procedure(s) Performed: Procedure(s) with comments: ANTERIOR CERVICAL DECOMPRESSION/DISCECTOMY FUSION 4 LEVELS (N/A) - C3-4 C4-5 C5-6 C6-7 Anterior cervical decompression/diskectomy/fusion/interbody prosthesis/plate  Patient Location: PACU  Anesthesia Type:General  Level of Consciousness: awake and alert   Airway & Oxygen Therapy: Patient Spontanous Breathing and Patient connected to nasal cannula oxygen  Post-op Assessment: Report given to PACU RN and Post -op Vital signs reviewed and stable  Post vital signs: Reviewed and stable  Complications: No apparent anesthesia complications

## 2014-07-03 NOTE — H&P (Signed)
Subjective: The patient is a 48 year old white female with a history of multiple sclerosis. She's had increasing neck pain, arm and hand numbness and tingling, gait difficulties, et Ronney Asters. She was worked up with a cervical MRI which demonstrated multilevel spondylosis and stenosis. I discussed the various treatment option with her including surgery. She has decided proceed with surgery.   Past Medical History  Diagnosis Date  . Arthritis   . Depression   . Migraines   . History of pneumothorax     1988-- SPONTANEOUS--  RESOLVED W/ CHEST TUBE  . Lesion of bladder   . H/O cold sores   . Borderline diabetes   . Frequency of urination   . Urgency of urination   . Nocturia   . Seasonal allergies   . Anal fissure   . Anxiety   . IC (interstitial cystitis)   . Pre-eclampsia   . MS (multiple sclerosis)     MRI showed plague on her brain    Past Surgical History  Procedure Laterality Date  . Tonsillectomy and adenoidectomy  1972  . D & c hysteroscopy with polypectomy  12-02-2003  . Cesarean section  1994  . Dx laparoscopy/  fulgeration endometriosis/  appendectomy  1985  . Cystoscopy with hydrodistension and biopsy Bilateral 02/15/2014    Procedure: CYSTOSCOPY  BILATERAL RETROGRADE PYLOGRAM, HYDRODISTENSION, INSTILATION OF MARCAINE AND PYRIDIUM;  Surgeon: Ardis Hughs, MD;  Location: [facility];  Service: Urology;  Laterality: Bilateral;  . Appendectomy    . Dilation and curettage of uterus      Allergies  Allergen Reactions  . Codeine     Makes her "busy", itchy  . Meperidine Hcl Nausea And Vomiting    SEVERE N/V  . Morphine Other (See Comments)    "SKIN CRAWLS"    History  Substance Use Topics  . Smoking status: Former Smoker -- 0.50 packs/day for 20 years    Types: Cigarettes    Quit date: 01/17/2011  . Smokeless tobacco: Never Used  . Alcohol Use: No    Family History  Problem Relation Age of Onset  . Hypertension Mother   . Diabetes Father    . Prostate cancer Father   . Lung cancer Father   . Cancer Father     cheek of mouth  . Diabetes Paternal Grandmother   . Diabetes Paternal Grandfather   . Diabetes Paternal Uncle     x 4  . Diabetes Paternal Aunt   . Colon cancer Maternal Uncle   . Esophageal cancer Neg Hx   . Rectal cancer Neg Hx   . Stomach cancer Neg Hx    Prior to Admission medications   Medication Sig Start Date End Date Taking? Authorizing Provider  ALPRAZolam Duanne Moron) 0.5 MG tablet Take 1 tablet (0.5 mg total) by mouth 2 (two) times daily as needed for anxiety. 05/30/14  Yes Midge Minium, MD  amitriptyline (ELAVIL) 25 MG tablet Take 2 tablets (50 mg total) by mouth at bedtime. 05/30/14  Yes Adam Melvern Sample, DO  docusate sodium (COLACE) 100 MG capsule Take 1 capsule (100 mg total) by mouth 2 (two) times daily as needed (take to keep stool soft.). 02/15/14  Yes Ardis Hughs, MD  DULoxetine (CYMBALTA) 20 MG capsule Take 2 capsules (40 mg total) by mouth daily. 04/27/14  Yes Tammi Sou, MD  fesoterodine (TOVIAZ) 8 MG TB24 tablet Take 1 tablet (8 mg total) by mouth daily. 02/15/14  Yes Ardis Hughs, MD  naphazoline (NAPHCON) 0.1 % ophthalmic solution Place 1 drop into both eyes 4 (four) times daily as needed for irritation.   Yes Historical Provider, MD  naproxen (NAPROSYN) 500 MG tablet Take 500 mg by mouth daily as needed for headache.   Yes Historical Provider, MD  SUMAtriptan (IMITREX) 100 MG tablet Take 100 mg by mouth every 2 (two) hours as needed for migraine. May repeat in 2 hours if headache persists or recurs.   Yes Historical Provider, MD  valACYclovir (VALTREX) 1000 MG tablet Take 2,000 mg by mouth as directed. 2 grams Q12 x2 doses prn cold sores 01/16/14   Midge Minium, MD     Review of Systems  Positive ROS: As above  All other systems have been reviewed and were otherwise negative with the exception of those mentioned in the HPI and as above.  Objective: Vital signs in  last 24 hours: Temp:  [97 F (36.1 C)] 97 F (36.1 C) (10/14 0738) Pulse Rate:  [74] 74 (10/14 0738) Resp:  [18] 18 (10/14 0738) BP: (119)/(70) 119/70 mmHg (10/14 0738) SpO2:  [99 %] 99 % (10/14 0738) Weight:  [53.071 kg (117 lb)] 53.071 kg (117 lb) (10/14 0738)  General Appearance: Alert, cooperative, no distress, Head: Normocephalic, without obvious abnormality, atraumatic Eyes: PERRL, conjunctiva/corneas clear, EOM's intact,    Ears: Normal  Throat: Normal  Neck: Supple, symmetrical, trachea midline, no adenopathy; thyroid: No enlargement/tenderness/nodules; no carotid bruit or JVD, positive Lhermitte sign Back: Symmetric, no curvature, ROM normal, no CVA tenderness Lungs: Clear to auscultation bilaterally, respirations unlabored Heart: Regular rate and rhythm, no murmur, rub or gallop Abdomen: Soft, non-tender,, no masses, no organomegaly Extremities: Extremities normal, atraumatic, no cyanosis or edema Pulses: 2+ and symmetric all extremities Skin: Skin color, texture, turgor normal, no rashes or lesions  NEUROLOGIC:   Mental status: alert and oriented, no aphasia, good attention span, Fund of knowledge/ memory ok Motor Exam - grossly normal Sensory Exam - grossly normal Reflexes: The patient is diffusely hyperreflexic Coordination - grossly normal Gait - grossly normal Balance - grossly normal Cranial Nerves: I: smell Not tested  II: visual acuity  OS: Normal  OD: Normal   II: visual fields Full to confrontation  II: pupils Equal, round, reactive to light  III,VII: ptosis None  III,IV,VI: extraocular muscles  Full ROM  V: mastication Normal  V: facial light touch sensation  Normal  V,VII: corneal reflex  Present  VII: facial muscle function - upper  Normal  VII: facial muscle function - lower Normal  VIII: hearing Not tested  IX: soft palate elevation  Normal  IX,X: gag reflex Present  XI: trapezius strength  5/5  XI: sternocleidomastoid strength 5/5  XI: neck  flexion strength  5/5  XII: tongue strength  Normal    Data Review Lab Results  Component Value Date   WBC 8.9 06/27/2014   HGB 13.0 06/27/2014   HCT 38.3 06/27/2014   MCV 92.7 06/27/2014   PLT 191 06/27/2014   Lab Results  Component Value Date   NA 137 06/27/2014   K 4.1 06/27/2014   CL 100 06/27/2014   CO2 26 06/27/2014   BUN 18 06/27/2014   CREATININE 0.66 06/27/2014   GLUCOSE 92 06/27/2014   No results found for this basename: INR, PROTIME    Assessment/Plan: C3-4, C4-5, C5-C6, and C6-7 spondylosis, stenosis, cervicalgia, cervical radiculopathy, cervical myelopathy: I discussed situation with the patient. I reviewed her imaging studies with her and pointed out the abnormalities. We  have discussed the various treatment options including surgery. I have described the surgical treatment option of a C3-4, C4-5, C5-6 and C6-7 anterior cervical discectomy, fusion, and plating. I've shown her surgical models. We have discussed the risks, benefits, alternatives, and likelihood of achieving our goals with surgery. I have answered all the patient's questions. She has decided to proceed with surgery.   Benjie Ricketson D 07/03/2014 7:51 AM

## 2014-07-03 NOTE — Anesthesia Procedure Notes (Signed)
Procedure Name: Intubation Date/Time: 07/03/2014 8:33 AM Performed by: Octavio Graves Pre-anesthesia Checklist: Patient identified, Timeout performed, Emergency Drugs available, Suction available and Patient being monitored Patient Re-evaluated:Patient Re-evaluated prior to inductionOxygen Delivery Method: Circle system utilized Preoxygenation: Pre-oxygenation with 100% oxygen Intubation Type: IV induction Ventilation: Mask ventilation without difficulty Laryngoscope Size: Miller and 2 Grade View: Grade I Tube type: Oral Tube size: 7.0 mm Number of attempts: 1 Airway Equipment and Method: Stylet Placement Confirmation: ETT inserted through vocal cords under direct vision,  positive ETCO2 and breath sounds checked- equal and bilateral Secured at: 21 cm Tube secured with: Tape Dental Injury: Teeth and Oropharynx as per pre-operative assessment  Comments: IV induction Jackson- intubation AM CRNA- atraumatic- teeth with slight chipped in front as prior to laryngoscopy-

## 2014-07-03 NOTE — Anesthesia Postprocedure Evaluation (Signed)
  Anesthesia Post Note  Patient: Stacy Logan  Procedure(s) Performed: Procedure(s) (LRB): ANTERIOR CERVICAL DECOMPRESSION/DISCECTOMY FUSION 4 LEVELS (N/A)  Anesthesia type: GA  Patient location: PACU  Post pain: Pain level controlled  Post assessment: Post-op Vital signs reviewed  Last Vitals:  Filed Vitals:   07/03/14 1315  BP:   Pulse: 93  Temp:   Resp: 20    Post vital signs: Reviewed  Level of consciousness: sedated  Complications: No apparent anesthesia complications

## 2014-07-04 ENCOUNTER — Encounter (HOSPITAL_COMMUNITY): Payer: Self-pay | Admitting: *Deleted

## 2014-07-04 MED ORDER — ALPRAZOLAM 0.5 MG PO TABS: 0.5000 mg | ORAL_TABLET | Freq: Two times a day (BID) | ORAL | Status: DC | PRN

## 2014-07-04 MED ORDER — OXYCODONE HCL 5 MG PO TABS
10.0000 mg | ORAL_TABLET | ORAL | Status: DC | PRN
  Administered 2014-07-04 – 2014-07-05 (×4): 10 mg via ORAL
  Filled 2014-07-04 (×4): qty 2

## 2014-07-04 MED ORDER — PANTOPRAZOLE SODIUM 40 MG PO TBEC
40.0000 mg | DELAYED_RELEASE_TABLET | Freq: Every day | ORAL | Status: DC
  Administered 2014-07-04: 40 mg via ORAL

## 2014-07-04 NOTE — Progress Notes (Signed)
Called report to RN on 3-spine. All questions answered. Will transport patient to new room with all belongings. Family at bedside(daughter).

## 2014-07-04 NOTE — Progress Notes (Signed)
Utilization review completed.  

## 2014-07-04 NOTE — Progress Notes (Signed)
Patient ID: Stacy Logan, female   DOB: 1965-11-28, 48 y.o.   MRN: [id number] Subjective:  The patient is alert and pleasant. Her neck is appropriately sore. She looks well.  Objective: Vital signs in last 24 hours: Temp:  [97 F (36.1 C)-99.3 F (37.4 C)] 98.7 F (37.1 C) (10/15 0700) Pulse Rate:  [83-107] 107 (10/15 0800) Resp:  [14-26] 21 (10/15 0800) BP: (128-152)/(65-87) 138/84 mmHg (10/15 0800) SpO2:  [97 %-100 %] 98 % (10/15 0800) Weight:  [56.1 kg (123 lb 10.9 oz)] 56.1 kg (123 lb 10.9 oz) (10/14 1408)  Intake/Output from previous day: 10/14 0701 - 10/15 0700 In: 3983.8 [P.O.:480; I.V.:3403.8; IV Piggyback:100] Out: 8546 [Urine:3250; Drains:120; Blood:250] Intake/Output this shift: Total I/O In: 225 [I.V.:225] Out: 1110 [Urine:1100; Drains:10]  Physical exam the patient is alert and oriented. Her strength is normal in all 4 extremities. Her dressing is clean and dry without evidence of hematoma or shift. I removed the drain.  Lab Results: No results found for this basename: WBC, HGB, HCT, PLT,  in the last 72 hours BMET No results found for this basename: NA, K, CL, CO2, GLUCOSE, BUN, CREATININE, CALCIUM,  in the last 72 hours  Studies/Results: Dg Cervical Spine 2-3 Views  07/03/2014   CLINICAL DATA:  C3 through C7 ACDF.  EXAM: CERVICAL SPINE - 2-3 VIEW  COMPARISON:  MRI 05/22/2014.  FINDINGS: Two lateral radiographs are submitted for interpretation. C3 through C7 ACDF is present. Endotracheal tube is present. Surgical sponges are present in the prevertebral soft tissues.  IMPRESSION: C3 through C7 ACDF.   Electronically Signed   By: Dereck Ligas M.D.   On: 07/03/2014 13:09    Assessment/Plan: Postop day 1: The patient is doing well. I will move her to the floor. She will likely go home tomorrow.  LOS: 1 day     Airam Runions D 07/04/2014, 10:47 AM

## 2014-07-05 MED ORDER — DSS 100 MG PO CAPS: 100.0000 mg | ORAL_CAPSULE | Freq: Two times a day (BID) | ORAL | Status: DC

## 2014-07-05 MED ORDER — OXYCODONE-ACETAMINOPHEN 10-325 MG PO TABS: 1.0000 | ORAL_TABLET | ORAL | Status: DC | PRN

## 2014-07-05 NOTE — Progress Notes (Signed)
Pt doing well. Pt and daughter given D/C instructions with Rx's, verbal understanding was provided. Pt's incision is clean and dry with no sign of infection. Pt's IV was removed prior to D/C. Pt D/C'd home via walking @ 0810 per MD order. Pt is stable @ D/C and has no other needs at this time. Holli Humbles, RN

## 2014-07-05 NOTE — Discharge Instructions (Signed)
Call MD for: Difficulty breathing, headache or visual disturbances, extreme fatigue  °Call MD for: hives ° Call MD for: persistant dizziness or light-headedness ° Call MD for: persistant nausea and vomiting  °Call MD for: redness, tenderness, or signs of infection (pain, swelling, redness, odor or green/yellow discharge around incision site)  °Call MD for: severe uncontrolled pain  °Call MD for: temperature >100.4 Diet - low sodium heart healthy Discharge instructions  ° Call [phone] for a followup appointment. °  °Increase activity slowly Remove dressing in 48 hours  ° ° ° °Anterior Cervical Diskectomy and Fusion °Anterior cervical diskectomy is surgery done on the upper spine to relieve pressure on one or more nerve roots, or on the spinal cord. There are 7 bones in your neck, called the cervical spine. These 7 bones (vertebrae) sit one on top of the other. Cushions (intervertebral disks) separate the vertebrae and act like shock absorbers. As we age, degeneration of our bones, joints, and disks can cause neck pain and tightening around the spinal cord and nerve roots. This causes arm pain and weakness.  °Degeneration involves: °· Herniated Disk. With age, the disks dry up and can rupture. In this condition, the center of the disk bulges out (disk herniation). This can cause pressure on a nerve, which produces pain or weakness in the arm. °· Bone spurs and spinal stenosis. As we age, growths often develop on our bones. These growths are called bone spurs (osteophytes). A bone spur is a collection of calcium. As bone spurs grow and extend, the vertebral openings become narrow. The spinal canal and/or the foramen (opening for nerve passageways) become smaller. This narrowing (stenosis) may cause pinching (compression) of the spinal cord or the spinal nerve root. The nerve injury can cause pain, weakness, numbness, and loss of coordination in the upper limbs. Often, patients have difficulty with their hand  writing or they start dropping things, because their hand grip is weaker. The spinal cord damage can cause increased stiffness, more frequent falls, electric shooting pain, and changes in bowel and bladder control. °Degeneration in the neck results in three common problems: °· Radiculopathy - Nerve compression that results in weakness or pain that radiates down the arm. °· Myelopathy - Spinal cord compression that causes stiffness, difficulty with walking, coordination, and trouble with bowel or bladder habits. °· Neck pain - Worn out joints cause pain as the neck moves. °Treatment: °· Radiculopathy - Surgery is performed to remove the bony and disk material that is pushing on the nerve. °· Myelopathy - Surgery is performed to remove the bony and disk material that pushes on the spinal cord. °· Neck pain - Surgery is performed to combine (fuse) the joints of the neck together, so they cannot move or cause pain. °Surgery can be done from the front or the back of the neck. When it is done from the front, it is called an anterior (front) cervical (neck) diskectomy (removal of the disk) and fusion. °LET YOUR CAREGIVER KNOW ABOUT:  °· Recent infections. °· Any shooting pains down your leg, when you move your neck. °· Any difficulty swallowing. °· A smoking history. °· Use of blood thinners or anti-inflammatory medicines. °· Any history of injury to your shoulders. °· Any history of injury to your vocal cords. °· Any foreign objects in your body from a previous surgery. °· Any recent fevers or illness. °· Past medical history (diabetes, strokes). °· Past problems with anesthetics. °· Possibility of pregnancy. °· History of blood clots (  deep vein thrombosis). °· History of bleeding or blood problems. °· Past surgeries. °· Other health problems. °· Allergies. °· Medicines you take, including herbs, eye drops, over-the-counter medicines, and creams. °· Use of steroids (by mouth or creams). °RISKS AND  COMPLICATIONS °· Infection. °· Bleeding. °· Injury to the following structures: °¨ Carotid artery. This can result in a stroke or significant amount of bleeding. °¨ Esophagus, resulting in difficulty swallowing. °¨ Recurrent laryngeal nerve, resulting in hoarseness of the voice. °¨ Spinal cord injury, ranging from mild to complete quadriparesis (muscle weakness in all four limbs). °¨ Nerve root injury, resulting in muscle weakness in the upper limb. °¨ Leakage of cerebrospinal fluid. °BEFORE THE PROCEDURE  °· You will be given medicine to help you sleep (general anesthetic), and a breathing tube will be placed. °· You will be given antibiotics to keep the infection rate down. °· The incision site on your neck will be marked. °· Your neck will be cleaned, to reduce the risk of infection. °PROCEDURE  °An anterior cervical fusion means that the operation is done through the front (anterior) part of your neck. The cut made by the surgeon (incision) is usually within a skin fold line on the neck. After pushing aside the neck muscles, the surgeon removes the affected, degenerated disk and bone spurs (osteophytes), which takes the pressure off the nerves and spinal cord. This is called a decompression. The area where the disk was removed is then filled with a small piece of plastic. This plastic takes the place of the disk and keeps the nerve passageway (foramen) open and clear for the nerves. In most cases, the surgeon uses metal plates or pins (hardware) in the neck, to help stabilize the level being fused. The hardware reduces motion at that level, so it can fuse. This provides extra support to the neck. A cervical fusion procedure takes anywhere from a couple to several hours, depending on the size of the neck, history of previous surgery, and number of levels being fused. °AFTER THE PROCEDURE  °· You will likely spend 24-48 hours in the hospital. During this time, your caregivers will look for any signs of  complications from the procedure. °· Your caregiver will watch you, to make sure that fluid draining from the surgery slows down. It is important that a large mass of blood does not form in your neck, which would cause difficulty with breathing. °· You will get 24 hours of antibiotics. °· You can start to eat as soon as you feel comfortable. °· Once you have started eating, walking, urinating (voiding) and having bowel movements on your own, your caregiver will discharge you home. °HOME CARE INSTRUCTIONS  °· For 2 weeks, do not soak the incision site under water. Do not swim or take baths. Showers are okay, but rinse off the incision sites. °· Do not over exert yourself. Allow time for the incision to heal. °· It can take from 6 weeks to 6 months for fusion to take effect. Your caregiver may ask you to wear a neck collar during this time, as they check the fusion with multiple (serial) X-rays. °Document Released: 08/25/2009 Document Revised: 01/01/2013 Document Reviewed: 08/25/2009 °[employer]® Patient Information ©2015 [employer]. This information is not intended to replace advice given to you by your health care provider. Make sure you discuss any questions you have with your health care provider. ° °

## 2014-07-05 NOTE — Discharge Summary (Signed)
Physician Discharge Summary  Patient ID: MYRKA SYLVA MRN: [id number] DOB/AGE: Jan 03, 1966 48 y.o.  Admit date: 07/03/2014 Discharge date: 07/05/2014  Admission Diagnoses: C3-4, C4-5, C5-6 and C6-7 disc degeneration, spondylosis, stenosis, cervical radiculopathy, cervical myelopathy  Discharge Diagnoses: The same Active Problems:   Cervical spondylosis with myelopathy and radiculopathy   Discharged Condition: good  Hospital Course: I performed a C3-4, C4-5, C5-6 and C6-7 anterior cervical discectomy, fusion, and plating on the patient on 07/03/2014. The surgery went well.  The patient's postoperative course was unremarkable. On postoperative day #2 the patient requested discharge to home. She was given oral and written discharge instructions. All her questions were answered.  Consults: None Significant Diagnostic Studies: None Treatments: C3-4, C4-5, C5-6 and C6-7 anterior cervical discectomy, fusion, and plating. Discharge Exam: Blood pressure 135/81, pulse 106, temperature 98.2 F (36.8 C), temperature source Oral, resp. rate 18, height 5\' 11"  (1.803 m), weight 56.1 kg (123 lb 10.9 oz), SpO2 96.00%. The patient is alert and pleasant. Her strength is grossly normal in all 4 extremities. Her dressing is clean and dry. There is no evidence of hematoma or shift. The patient looks well.  Disposition: Home  Discharge Instructions   Call MD for:  difficulty breathing, headache or visual disturbances    Complete by:  As directed      Call MD for:  extreme fatigue    Complete by:  As directed      Call MD for:  hives    Complete by:  As directed      Call MD for:  persistant dizziness or light-headedness    Complete by:  As directed      Call MD for:  persistant nausea and vomiting    Complete by:  As directed      Call MD for:  redness, tenderness, or signs of infection (pain, swelling, redness, odor or green/yellow discharge around incision site)    Complete by:  As directed       Call MD for:  severe uncontrolled pain    Complete by:  As directed      Call MD for:  temperature >100.4    Complete by:  As directed      Diet - low sodium heart healthy    Complete by:  As directed      Discharge instructions    Complete by:  As directed   Call [phone] for a followup appointment. Take a stool softener while you are using pain medications.     Driving Restrictions    Complete by:  As directed   Do not drive for 2 weeks.     Increase activity slowly    Complete by:  As directed      Lifting restrictions    Complete by:  As directed   Do not lift more than 5 pounds. No excessive bending or twisting.     May shower / Bathe    Complete by:  As directed   He may shower after the pain she is removed 3 days after surgery. Leave the incision alone.     Remove dressing in 24 hours    Complete by:  As directed             Medication List    STOP taking these medications       naproxen 500 MG tablet  Commonly known as:  NAPROSYN      TAKE these medications       ALPRAZolam 0.5 MG  tablet  Commonly known as:  XANAX  Take 1 tablet (0.5 mg total) by mouth 2 (two) times daily as needed for anxiety.     amitriptyline 25 MG tablet  Commonly known as:  ELAVIL  Take 2 tablets (50 mg total) by mouth at bedtime.     docusate sodium 100 MG capsule  Commonly known as:  COLACE  Take 1 capsule (100 mg total) by mouth 2 (two) times daily as needed (take to keep stool soft.).     DSS 100 MG Caps  Take 100 mg by mouth 2 (two) times daily.     DULoxetine 20 MG capsule  Commonly known as:  CYMBALTA  Take 2 capsules (40 mg total) by mouth daily.     fesoterodine 8 MG Tb24 tablet  Commonly known as:  TOVIAZ  Take 1 tablet (8 mg total) by mouth daily.     naphazoline 0.1 % ophthalmic solution  Commonly known as:  NAPHCON  Place 1 drop into both eyes 4 (four) times daily as needed for irritation.     oxyCODONE-acetaminophen 10-325 MG per tablet  Commonly known  as:  PERCOCET  Take 1 tablet by mouth every 4 (four) hours as needed for pain.     SUMAtriptan 100 MG tablet  Commonly known as:  IMITREX  Take 100 mg by mouth every 2 (two) hours as needed for migraine. May repeat in 2 hours if headache persists or recurs.     valACYclovir 1000 MG tablet  Commonly known as:  VALTREX  Take 2,000 mg by mouth as directed. 2 grams Q12 x2 doses prn cold sores         Signed: Chidera Thivierge D 07/05/2014, 7:46 AM

## 2014-07-09 ENCOUNTER — Other Ambulatory Visit: Payer: Self-pay | Admitting: General Practice

## 2014-07-09 ENCOUNTER — Encounter (HOSPITAL_COMMUNITY): Payer: Self-pay | Admitting: Neurosurgery

## 2014-07-09 MED ORDER — DULOXETINE HCL 20 MG PO CPEP: 40.0000 mg | ORAL_CAPSULE | Freq: Every day | ORAL | Status: DC

## 2014-07-29 ENCOUNTER — Telehealth: Payer: Self-pay | Admitting: Neurology

## 2014-07-29 ENCOUNTER — Ambulatory Visit: Payer: 59 | Admitting: Dietician

## 2014-07-29 NOTE — Telephone Encounter (Signed)
[phone] pt wants to

## 2014-07-31 ENCOUNTER — Encounter: Payer: Self-pay | Admitting: Family Medicine

## 2014-07-31 ENCOUNTER — Ambulatory Visit (INDEPENDENT_AMBULATORY_CARE_PROVIDER_SITE_OTHER): Payer: 59 | Admitting: Family Medicine

## 2014-07-31 VITALS — BP 124/78 | HR 123 | Temp 98.6°F | Resp 16 | Wt 110.4 lb

## 2014-07-31 DIAGNOSIS — G35 Multiple sclerosis: Secondary | ICD-10-CM

## 2014-07-31 DIAGNOSIS — L700 Acne vulgaris: Secondary | ICD-10-CM | POA: Insufficient documentation

## 2014-07-31 DIAGNOSIS — F418 Other specified anxiety disorders: Secondary | ICD-10-CM

## 2014-07-31 MED ORDER — DULOXETINE HCL 20 MG PO CPEP: 40.0000 mg | ORAL_CAPSULE | Freq: Every day | ORAL | Status: DC

## 2014-07-31 MED ORDER — BENZOYL PEROXIDE 5 % EX GEL: Freq: Every day | CUTANEOUS | Status: DC

## 2014-07-31 NOTE — Progress Notes (Signed)
   Subjective:    Patient ID: Stacy Logan, female    DOB: 1965/11/30, 48 y.o.   MRN: [id number]  HPI Anxiety- started on xanax at last visit for panicked moments.  Pt reports taking 1/2 tab allows her to focus and block out stress and disctractions.  'best thing i've ever been on'.  Facial acne- cystic, very painful.  Using [place] OTC products.  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  Pt is now interested in applying for disability.     Review of Systems For ROS see HPI     Objective:   Physical Exam  Constitutional: She is oriented to person, place, and time. She appears well-developed. No distress.  HENT:  Head: Normocephalic and atraumatic.  Neurological: She is alert and oriented to person, place, and time.  Skin: Skin is warm and dry.  Closed comedones scattered on face (covered w/ heavy foundation)  Psychiatric: She has a normal mood and affect. Her behavior is normal. Thought content normal.  Pt less anxious than previous.  No longer tearful  Vitals reviewed.         Assessment & Plan:

## 2014-07-31 NOTE — Patient Instructions (Signed)
Schedule your complete physical in 3-4 months Continue the Alprazolam and Cymbalta Wash face twice daily and apply the Benzoyl Peroxide before bed Call with any questions or concerns Happy Holidays!!!

## 2014-07-31 NOTE — Assessment & Plan Note (Signed)
New.  Mild.  Start benzoyl peroxide and reviewed skin care regimen/hygeine w/ pt.

## 2014-07-31 NOTE — Progress Notes (Signed)
Pre visit review using our clinic review tool, if applicable. No additional management support is needed unless otherwise documented below in the visit note. 

## 2014-07-31 NOTE — Assessment & Plan Note (Signed)
Improved since start xanax prn.  Continue Cymbalta- refill provided.  Will continue to follow.

## 2014-07-31 NOTE — Assessment & Plan Note (Signed)
Pt is following w/ neuro.  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  Plans to seek disability.  Explained that I am not the treating physician for her disabling dx and she should discuss moving forward and any necessary paperwork w/ Dr Tomi Likens.

## 2014-08-01 ENCOUNTER — Ambulatory Visit (INDEPENDENT_AMBULATORY_CARE_PROVIDER_SITE_OTHER): Payer: 59 | Admitting: Neurology

## 2014-08-01 ENCOUNTER — Encounter: Payer: Self-pay | Admitting: Neurology

## 2014-08-01 VITALS — BP 122/68 | HR 104 | Temp 98.6°F | Resp 16 | Ht 70.0 in | Wt 112.0 lb

## 2014-08-01 DIAGNOSIS — G35 Multiple sclerosis: Secondary | ICD-10-CM

## 2014-08-01 LAB — CBC WITH DIFFERENTIAL/PLATELET
Basophils Absolute: 0 10*3/uL (ref 0.0–0.1)
Basophils Relative: 0 % (ref 0–1)
EOS PCT: 3 % (ref 0–5)
Eosinophils Absolute: 0.2 10*3/uL (ref 0.0–0.7)
HCT: 37.1 % (ref 36.0–46.0)
Hemoglobin: 12.9 g/dL (ref 12.0–15.0)
Lymphocytes Relative: 37 % (ref 12–46)
Lymphs Abs: 3 10*3/uL (ref 0.7–4.0)
MCH: 30.9 pg (ref 26.0–34.0)
MCHC: 34.8 g/dL (ref 30.0–36.0)
MCV: 89 fL (ref 78.0–100.0)
Monocytes Absolute: 0.6 10*3/uL (ref 0.1–1.0)
Monocytes Relative: 7 % (ref 3–12)
NEUTROS ABS: 4.3 10*3/uL (ref 1.7–7.7)
NEUTROS PCT: 53 % (ref 43–77)
PLATELETS: 325 10*3/uL (ref 150–400)
RBC: 4.17 MIL/uL (ref 3.87–5.11)
RDW: 13.5 % (ref 11.5–15.5)
WBC: 8.1 10*3/uL (ref 4.0–10.5)

## 2014-08-01 LAB — COMPLETE METABOLIC PANEL WITH GFR
ALT: 13 U/L (ref 0–35)
AST: 17 U/L (ref 0–37)
Albumin: 4.2 g/dL (ref 3.5–5.2)
Alkaline Phosphatase: 95 U/L (ref 39–117)
BUN: 11 mg/dL (ref 6–23)
CALCIUM: 10 mg/dL (ref 8.4–10.5)
CHLORIDE: 103 meq/L (ref 96–112)
CO2: 28 mEq/L (ref 19–32)
CREATININE: 0.52 mg/dL (ref 0.50–1.10)
GFR, Est African American: >89 mL/min
GFR, Est Non African American: >89 mL/min
Glucose, Bld: 110 mg/dL — ABNORMAL HIGH (ref 70–99)
POTASSIUM: 5.1 meq/L (ref 3.5–5.3)
Sodium: 140 mEq/L (ref 135–145)
Total Bilirubin: 0.3 mg/dL (ref 0.2–1.2)
Total Protein: 7.1 g/dL (ref 6.0–8.3)

## 2014-08-01 NOTE — Progress Notes (Signed)
NEUROLOGY FOLLOW UP OFFICE NOTE  Stacy Logan [id number]  HISTORY OF PRESENT ILLNESS: Stacy Logan is a 48 year old right-handed woman with history of depression, multiple sclerosis and chronic pelvic pain, and overactive bladder who presents following testing for possible multiple sclerosis.  Cervical films and records personally reviewed.  Records and labs also reviewed.  UPDATE: Due to the cervical spondylosis and myelopathy, she was referred to Stacy Logan at [facility].  As at least some of her symptoms are probably related to this, she underwent anterior cervical decompression, discectomy, fusion and plating at C3-4, C4-5, C5-6 and C6-7 on 07/03/14.  Post-surgical cervical spine films showed C3-C7 ACDF.  She has been doing well since the surgery.  She reports increased weakness particularly in the left arm.  She is not to put strain on her neck. One of her major concerns is her ability to work.  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  Sometimes, she feels that she stutters or says the wrong word.  She had increased migraine prior to surgery, however it has since improved.  She is taking amitriptyline 50mg .  She takes Imitrex as well.  She saw the ophthalmologist, who said she had only a small cataract.  Labs from 06/05/14 showed ANA negative, Sed Rate 4, ANCA negative, SSA/SSB antibodies negative.  She did not have the LP performed due to the severe cervical stenosis.  HISTORY:  In 1998, she developed hearing loss in her left ear.  She saw ENT, who ordered an MRI of the brain, which revealed "plaques."  She was referred to a neurologist at that time, who recommended following up in 2 years with another MRI.  In the interim, she did not exhibit any new symptoms, but she never regained her hearing in the left ear.  She did have a follow up MRI with the neurologist, who suggested following up with another MRI in a couple of years.  She  never had any other workup performed.  Over the past 3 years, she began experiencing "flare-ups."  During these flare-ups, she will have frequent falls, like her legs giving out from under her.  Her legs feel extremely heavy.  She also experiences weight loss during these spells, although her appetite is good.  Sometimes, she has blurred vision.  She reports that she sometimes uses the wrong words when she speaks.  During these spells, she has an increase in migraines.  These flare-ups usually last 3-4 weeks and occur every 4 to 6 months.  However, she has been experiencing symptoms for the past two months.  She had a flare-up last summer, in which she lost 10 lbs.  Over the past two months, she lost another 12 lbs.  Flare-ups occur in different seasons.  She has chronic symptoms as well.  She reports fatigue, although she is able to perform ADLs and go to work Research officer, [employer]).  She has urinary frequency and recently had hydrodistention performed.  She does report some constipation, which she attributes to medication side effects.  She reports muscle spasms in the legs.  She reports numbness in the 4th and 5th digits of both hands, which she attributes to a slipped disc in her neck after a fall about 5 years ago.  She also reports short-term memory problems.  She began having migraines following her pregnancy in 1994, when she had pre-eclampsia.  It starts on the top of her head and then spreads to involve the  entire head until it is a holocephalic vice-like pressure.  It is associated with nausea, vomiting, photophobia, phonophobia, osmophobia, and blurred vision.  There is no aura.  It usually lasts 4-5 hours and occurs 1 to 2 times per month. She was started on Imitrex, which helps.  She was evaluated by GI for her weight loss.  Colonoscopy revealed some polyps but no obvious cause was found.   She denies family history of MS.  Her sister has migraines.  MRI of the brain with and without contrast was  performed on 05/22/14, which revealed multiple non-enhancing bilateral periventricular and subcortical hyperintensities.  MRI of the cervical spine with and without contrast showed multilevel disc degeneration with severe spinal stenosis with right greater than left foraminal stenosis at C5-6, as well as moderate spinal stenosis at C3-4 and C4-5.  6 mm enhancing focus in seen in the spinal cord at C5-6, as well as subtle enhancing lesion at C5.  An incidental 1.6 cm right thyroid nodule was noted.  Recent labs include HIV negative, TSH 0.37  PAST MEDICAL HISTORY: Past Medical History  Diagnosis Date  . Arthritis   . Depression   . Migraines   . History of pneumothorax     1988-- SPONTANEOUS--  RESOLVED W/ CHEST TUBE  . Lesion of bladder   . H/O cold sores   . Borderline diabetes   . Frequency of urination   . Urgency of urination   . Nocturia   . Seasonal allergies   . Anal fissure   . Anxiety   . IC (interstitial cystitis)   . Pre-eclampsia   . MS (multiple sclerosis)     MRI showed plague on her brain    MEDICATIONS: Current Outpatient Prescriptions on File Prior to Visit  Medication Sig Dispense Refill  . ALPRAZolam (XANAX) 0.5 MG tablet Take 1 tablet (0.5 mg total) by mouth 2 (two) times daily as needed for anxiety. 60 tablet 1  . amitriptyline (ELAVIL) 25 MG tablet Take 2 tablets (50 mg total) by mouth at bedtime. 60 tablet 3  . benzoyl peroxide 5 % gel Apply topically daily. 90 g 3  . Diazepam (VALIUM PO) Take by mouth.    . docusate sodium (COLACE) 100 MG capsule Take 1 capsule (100 mg total) by mouth 2 (two) times daily as needed (take to keep stool soft.). 60 capsule 0  . DULoxetine (CYMBALTA) 20 MG capsule Take 2 capsules (40 mg total) by mouth daily. 60 capsule 6  . fesoterodine (TOVIAZ) 8 MG TB24 tablet Take 1 tablet (8 mg total) by mouth daily. 30 tablet 5  . naphazoline (NAPHCON) 0.1 % ophthalmic solution Place 1 drop into both eyes 4 (four) times daily as needed for  irritation.    . SUMAtriptan (IMITREX) 100 MG tablet Take 100 mg by mouth every 2 (two) hours as needed for migraine. May repeat in 2 hours if headache persists or recurs.    . valACYclovir (VALTREX) 1000 MG tablet Take 2,000 mg by mouth as directed. 2 grams Q12 x2 doses prn cold sores     No current facility-administered medications on file prior to visit.    ALLERGIES: Allergies  Allergen Reactions  . Codeine     Makes her "busy", itchy  . Meperidine Hcl Nausea And Vomiting    SEVERE N/V  . Morphine Other (See Comments)    "SKIN CRAWLS"    FAMILY HISTORY: Family History  Problem Relation Age of Onset  . Hypertension Mother   .  Diabetes Father   . Prostate cancer Father   . Lung cancer Father   . Cancer Father     cheek of mouth  . Diabetes Paternal Grandmother   . Diabetes Paternal Grandfather   . Diabetes Paternal Uncle     x 4  . Diabetes Paternal Aunt   . Colon cancer Maternal Uncle   . Esophageal cancer Neg Hx   . Rectal cancer Neg Hx   . Stomach cancer Neg Hx     SOCIAL HISTORY: History   Social History  . Marital Status: Single    Spouse Name: N/A    Number of Children: 1  . Years of Education: N/A   Occupational History  . self-employed house cleaning    Social History Main Topics  . Smoking status: Former Smoker -- 0.50 packs/day for 20 years    Types: Cigarettes    Quit date: 01/17/2011  . Smokeless tobacco: Never Used  . Alcohol Use: No  . Drug Use: No  . Sexual Activity: No   Other Topics Concern  . Not on file   Social History Narrative    REVIEW OF SYSTEMS: Constitutional: No fevers, chills, or sweats, no generalized fatigue, change in appetite Eyes: No visual changes, double vision, eye pain Ear, nose and throat: No hearing loss, ear pain, nasal congestion, sore throat Cardiovascular: No chest pain, palpitations Respiratory:  No shortness of breath at rest or with exertion, wheezes GastrointestinaI: No nausea, vomiting, diarrhea,  abdominal pain, fecal incontinence Genitourinary:  No dysuria, urinary retention or frequency Musculoskeletal:  No neck pain, back pain Integumentary: No rash, pruritus, skin lesions Neurological: as above Psychiatric: No depression, insomnia, anxiety Endocrine: No palpitations, fatigue, diaphoresis, mood swings, change in appetite, change in weight, increased thirst Hematologic/Lymphatic:  No anemia, purpura, petechiae. Allergic/Immunologic: no itchy/runny eyes, nasal congestion, recent allergic reactions, rashes  PHYSICAL EXAM: Filed Vitals:   08/01/14 1129  BP: 122/68  Pulse: 104  Temp: 98.6 F (37 C)  Resp: 16   General: No acute distress Head:  Normocephalic/atraumatic Eyes:  Fundi not able to visualize. Neck: supple, no paraspinal tenderness, full range of motion Heart:  Regular rate and rhythm Lungs:  Clear to auscultation bilaterally Back: No paraspinal tenderness Neurological Exam: alert and oriented to person, place, and time, recent and remote memory intact, fund of knowledge intact, attention and concentration intact, speech fluent and not dysarthric, language intact.  CN II-XII intact.  Fundi not able to be visualized.  Increased tone.  Muscle strength 4+ left triceps, 4- interossei, 4+ finger and wrist flexors.  Otherwise, 5/5.  Reduced pinprick sensation in the right hand and dorsum of the left hand and forearm.  Reduced vibration in the fingers and left foot. DTR 3+ throughout.  Postural and kinetic tremor (right greater than left).  Normal station and stride.  Able to turn, walk on toes, heels and in tandem.  Romberg with sway.  IMPRESSION: Relapsing-remitting MS.  She had chronic lesions in the brain and enhancing lesion in the cervical cord. Cervical spondylosis and myelopathy status post surgery.  PLAN: 1.  Discussed at length the various disease modifying agents.  She is most interested in Tecfidera at this time.  We will find out which is most affordable.   Literature provided regarding the agents. 2.  Will send disability papers to me. 3.  Amitriptyline for headache preventative.  Imitrex for abortive therapy 4.  Taking cymbalta 40mg  for neuropathic pain. 5.  Check CBC with diff and CMP 6.  Follow up in 3 months after initiating medication.  Stacy Clines, DO  CC:  Annye Asa, MD

## 2014-08-01 NOTE — Patient Instructions (Addendum)
1.  We will try to start you on Tecfidera, which is a disease-modifying agent for MS.  However, we will look into all the medications to see which would be most affordable to you. 2.  We will check CBC with diff and CMP 3.  Send me forms 4.  Follow up in 3 months after starting Tecfidera.

## 2014-08-02 ENCOUNTER — Telehealth: Payer: Self-pay | Admitting: *Deleted

## 2014-08-02 NOTE — Telephone Encounter (Signed)
voice mail has not been set up yet

## 2014-08-06 ENCOUNTER — Telehealth: Payer: Self-pay | Admitting: *Deleted

## 2014-08-06 NOTE — Telephone Encounter (Signed)
Received medical record request via mail from [facility]. Forms forwarded to [facility]. JG//CMA

## 2014-08-08 ENCOUNTER — Telehealth: Payer: Self-pay | Admitting: Neurology

## 2014-08-08 NOTE — Telephone Encounter (Signed)
recv'd records request from [facility] by mail on 08/06/14. Request forwarded via inter-office mail to HIM at [facility] for processing / Sherri S.

## 2014-08-12 ENCOUNTER — Telehealth: Payer: Self-pay | Admitting: Neurology

## 2014-08-12 ENCOUNTER — Telehealth: Payer: Self-pay | Admitting: *Deleted

## 2014-08-12 NOTE — Telephone Encounter (Signed)
Sister called regarding pt. States patient is having left arm pain. Also describes seeing a constant knot in her arm. Very sore. Consider a Cortisone shot? Please call pt back at CB# [phone] / Sherri S.

## 2014-08-12 NOTE — Telephone Encounter (Signed)
Patient states she is having rt sided neck shoulder and arm pain I advised her to contact her surgeron since this started after her surgery on that side . She also states that Dr Arnoldo Morale prescribed her valium and she cannot take both valium and xanax together I also advised her to contact Dr Arnoldo Morale  Regarding how to come off the valium

## 2014-08-21 ENCOUNTER — Telehealth: Payer: Self-pay | Admitting: Neurology

## 2014-08-21 NOTE — Telephone Encounter (Signed)
Pt needs to talk to someone today about medication, she states that she has sores in her mouth please call [phone]

## 2014-08-21 NOTE — Telephone Encounter (Signed)
Patient states since starting Tecfidera last Thursday - starting on Saturday she has had cold sores develop in and around her mouth and her tongue feels like it is burning. She is due to increase her medication dose tonight. Please advise.

## 2014-08-22 ENCOUNTER — Ambulatory Visit (INDEPENDENT_AMBULATORY_CARE_PROVIDER_SITE_OTHER): Payer: 59 | Admitting: Family Medicine

## 2014-08-22 ENCOUNTER — Encounter: Payer: Self-pay | Admitting: Family Medicine

## 2014-08-22 ENCOUNTER — Telehealth: Payer: Self-pay | Admitting: *Deleted

## 2014-08-22 VITALS — BP 124/60 | HR 100 | Temp 97.9°F | Resp 16 | Wt 112.2 lb

## 2014-08-22 DIAGNOSIS — B370 Candidal stomatitis: Secondary | ICD-10-CM

## 2014-08-22 MED ORDER — LIDOCAINE VISCOUS 2 % MT SOLN: 20.0000 mL | OROMUCOSAL | Status: DC | PRN

## 2014-08-22 MED ORDER — NYSTATIN 100000 UNIT/ML MT SUSP: 5.0000 mL | Freq: Four times a day (QID) | OROMUCOSAL | Status: DC

## 2014-08-22 NOTE — Progress Notes (Signed)
   Subjective:    Patient ID: Stacy Logan, female    DOB: 1965-12-03, 48 y.o.   MRN: [id number]  HPI Blisters- started 1st dose of MS medication (Tecfidera) and developed cold sores w/in a few days.  Now has blisters in mouth.  Called drug company and they said it wasn't med related and to call neuro.  Neuro told her it might be thrush and to get appt.  Pt on Valtrex regularly.     Review of Systems For ROS see HPI     Objective:   Physical Exam  Constitutional: She appears well-developed and well-nourished. No distress.  HENT:  Pt w/ healing cold sore on R upper lip and L lower lip White area between L lower lip and gum consistent w/ thrush Dime sized area on center of tongue w/ while plaques in deep fissures  Vitals reviewed.         Assessment & Plan:

## 2014-08-22 NOTE — Progress Notes (Signed)
Pre visit review using our clinic review tool, if applicable. No additional management support is needed unless otherwise documented below in the visit note. 

## 2014-08-22 NOTE — Assessment & Plan Note (Signed)
New.  Start nystatin solution along w/ viscous lidocaine for pain relief.  Reviewed supportive care and red flags that should prompt return.  Pt expressed understanding and is in agreement w/ plan.

## 2014-08-22 NOTE — Telephone Encounter (Signed)
Patient was advised to contact PCP regarding blisters around nose and tongue

## 2014-08-22 NOTE — Telephone Encounter (Signed)
I really don't think it is related to the Tecfidera.  She probably just has developed cold sores.  She should discuss treatment with her PCP

## 2014-08-22 NOTE — Patient Instructions (Signed)
Follow up as needed Start the Nystatin as directed to treat the thrush Use the Lidocaine as needed for pain Call with any questions or concerns Hang in there!!!

## 2014-09-03 ENCOUNTER — Telehealth: Payer: Self-pay | Admitting: Neurology

## 2014-09-03 NOTE — Telephone Encounter (Signed)
   °  recv'd request for records by mail from [place]. Request forwarded by inter-office mail to HI at [facility] / Gayleen Orem

## 2014-09-06 ENCOUNTER — Telehealth: Payer: Self-pay | Admitting: Family Medicine

## 2014-09-06 DIAGNOSIS — F418 Other specified anxiety disorders: Secondary | ICD-10-CM

## 2014-09-06 MED ORDER — BENZOYL PEROXIDE 5 % EX GEL: Freq: Every day | CUTANEOUS | Status: DC

## 2014-09-06 MED ORDER — ALPRAZOLAM 0.5 MG PO TABS: 0.5000 mg | ORAL_TABLET | Freq: Two times a day (BID) | ORAL | Status: DC | PRN

## 2014-09-06 NOTE — Telephone Encounter (Signed)
Last OV 07-31-14 Alprazolam last filled 05-30-14 #60 with 1

## 2014-09-06 NOTE — Telephone Encounter (Signed)
Caller name: Yancy Relation to pt: self Call back number: [phone] [facility] [address]  Reason for call:   Patient requesting a xanax refill and would like a refill of the acne gel

## 2014-09-06 NOTE — Telephone Encounter (Signed)
Ok to refill with same signature.  I will sign in Dr. Virgil Benedict absence.

## 2014-09-06 NOTE — Telephone Encounter (Signed)
Med filled and faxed.  

## 2014-09-09 ENCOUNTER — Telehealth: Payer: Self-pay | Admitting: *Deleted

## 2014-09-09 NOTE — Telephone Encounter (Signed)
Patient is having new symptoms she is complaining of muscle spasms in her face and eye Call back number [phone]

## 2014-09-09 NOTE — Telephone Encounter (Signed)
Patient states for 1 week she has had  a spasm like feeling from the middle of face to eye feels like her eye is trying to close . Per Dr Tomi Likens it is more that likely stress related

## 2014-09-25 ENCOUNTER — Telehealth: Payer: Self-pay | Admitting: Neurology

## 2014-09-25 NOTE — Telephone Encounter (Signed)
Pt called requesting to speak to a nurse regarding a flare up. C/b [phone]

## 2014-09-26 ENCOUNTER — Telehealth: Payer: Self-pay | Admitting: *Deleted

## 2014-09-26 NOTE — Telephone Encounter (Signed)
She is already on Toviaz.  We can refer her to urology for neurogenic bladder to see if they have anything else to offer.

## 2014-09-26 NOTE — Telephone Encounter (Signed)
Patient called with update she states she is falling  having bladder issues and  heavy legs  but does not need to come to office just an update . She ask about disability papers I told her I have not seen any forms yet

## 2014-09-27 ENCOUNTER — Telehealth: Payer: Self-pay | Admitting: Neurology

## 2014-09-27 ENCOUNTER — Other Ambulatory Visit: Payer: Self-pay | Admitting: *Deleted

## 2014-09-27 DIAGNOSIS — G35 Multiple sclerosis: Secondary | ICD-10-CM

## 2014-09-27 MED ORDER — BACLOFEN 10 MG PO TABS: 5.0000 mg | ORAL_TABLET | Freq: Three times a day (TID) | ORAL | Status: DC

## 2014-09-27 NOTE — Telephone Encounter (Signed)
Pt would like to speak to some one about a flare up she is having and would like something for muscle [phone]

## 2014-09-27 NOTE — Telephone Encounter (Signed)
Prescribe her baclofen 5mg  three times daily as needed.

## 2014-09-27 NOTE — Telephone Encounter (Signed)
Patient calls stating she feels like she is having a flare up of her MS her legs are heavy and her back is hurting she ask if maybe there was something she could take for muscle spasm? Please advise

## 2014-10-10 ENCOUNTER — Telehealth: Payer: Self-pay | Admitting: Family Medicine

## 2014-10-10 MED ORDER — DULOXETINE HCL 20 MG PO CPEP: 40.0000 mg | ORAL_CAPSULE | Freq: Every day | ORAL | Status: DC

## 2014-10-10 NOTE — Telephone Encounter (Signed)
Caller name:Slabaugh, Suan Relation to JS:HFWY Call back [phone] Pharmacy:[facility]  Reason for call: pt need rx   DULoxetine (CYMBALTA) 20 MG capsule

## 2014-10-10 NOTE — Telephone Encounter (Signed)
Med filled.  

## 2014-10-16 ENCOUNTER — Ambulatory Visit (INDEPENDENT_AMBULATORY_CARE_PROVIDER_SITE_OTHER): Payer: 59 | Admitting: Family Medicine

## 2014-10-16 ENCOUNTER — Encounter: Payer: Self-pay | Admitting: Family Medicine

## 2014-10-16 VITALS — BP 120/80 | HR 72 | Temp 98.2°F | Resp 16 | Wt 120.4 lb

## 2014-10-16 DIAGNOSIS — J3489 Other specified disorders of nose and nasal sinuses: Secondary | ICD-10-CM

## 2014-10-16 MED ORDER — MUPIROCIN 2 % EX OINT: 1.0000 "application " | TOPICAL_OINTMENT | Freq: Two times a day (BID) | CUTANEOUS | Status: DC

## 2014-10-16 MED ORDER — BENZOYL PEROXIDE 5 % EX GEL
Freq: Every day | CUTANEOUS | Status: DC
Start: 2014-10-16 — End: 2015-05-15

## 2014-10-16 NOTE — Progress Notes (Signed)
   Subjective:    Patient ID: Stacy Logan, female    DOB: 12/17/1965, 49 y.o.   MRN: [id number]  HPI Nasal sores- since neck surgery, has had sores in nose 'that won't go away'.  Painful.  No bleeding.  No drainage.  No sores or lesions elsewhere.    Neck pain- + trap spasm, 'full of knots'.  Has Baclofen available.  Seeing Neurosurg   Review of Systems For ROS see HPI     Objective:   Physical Exam  Constitutional: She appears well-developed and well-nourished. No distress.  HENT:  Head: Normocephalic and atraumatic.  Mouth/Throat: Oropharynx is clear and moist. No oropharyngeal exudate.  Shallow ulceration in each nostril w/o bleeding or pus present.  No external redness of either nostril.  No swelling.  Vitals reviewed.         Assessment & Plan:

## 2014-10-16 NOTE — Progress Notes (Signed)
Pre visit review using our clinic review tool, if applicable. No additional management support is needed unless otherwise documented below in the visit note. 

## 2014-10-16 NOTE — Patient Instructions (Signed)
Follow up as needed Apply the Mupirocin ointment in your nose twice daily until symptoms improve Call with any questions or concerns You look great!  Keep up the good work!!

## 2014-10-17 ENCOUNTER — Encounter: Payer: Self-pay | Admitting: Family Medicine

## 2014-10-17 NOTE — Assessment & Plan Note (Signed)
New.  Start Bactroban intranasally.  Reviewed supportive care and red flags that should prompt return.  Pt expressed understanding and is in agreement w/ plan.

## 2014-10-28 ENCOUNTER — Telehealth: Payer: Self-pay | Admitting: Family Medicine

## 2014-10-28 DIAGNOSIS — H9192 Unspecified hearing loss, left ear: Secondary | ICD-10-CM

## 2014-10-28 NOTE — Telephone Encounter (Signed)
Ok to refer to Audiology (Dr Prescott Parma) for hearing loss in L ear

## 2014-10-28 NOTE — Telephone Encounter (Signed)
Caller name:Luff, Winda [place] Relation to pt: self  Call back [place]:  Reason for call:  Pt requesting a referral for speech and hearing for Dr. Prescott Parma in [place]

## 2014-10-28 NOTE — Telephone Encounter (Signed)
Ok for referral? Please advise on Dx?  

## 2014-10-28 NOTE — Telephone Encounter (Signed)
Referral placed.

## 2014-10-31 ENCOUNTER — Encounter: Payer: Self-pay | Admitting: Family Medicine

## 2014-11-13 ENCOUNTER — Other Ambulatory Visit: Payer: Self-pay | Admitting: Neurology

## 2014-11-13 ENCOUNTER — Other Ambulatory Visit: Payer: Self-pay | Admitting: Physician Assistant

## 2014-11-13 NOTE — Telephone Encounter (Signed)
Medication Detail      Disp Refills Start End     ALPRAZolam (XANAX) 0.5 MG tablet 60 tablet 1 09/06/2014     Sig - Route: Take 1 tablet (0.5 mg total) by mouth 2 (two) times daily as needed for anxiety. - Oral    Class: Print     Associated Diagnoses    Depression with anxiety - Primary

## 2014-11-15 ENCOUNTER — Telehealth: Payer: Self-pay | Admitting: Neurology

## 2014-11-15 DIAGNOSIS — R296 Repeated falls: Secondary | ICD-10-CM

## 2014-11-15 DIAGNOSIS — G35 Multiple sclerosis: Secondary | ICD-10-CM

## 2014-11-15 DIAGNOSIS — R29898 Other symptoms and signs involving the musculoskeletal system: Secondary | ICD-10-CM

## 2014-11-15 NOTE — Telephone Encounter (Signed)
Rx faxed to pharmacy.  Patient notified. eal

## 2014-11-15 NOTE — Telephone Encounter (Signed)
Please call patient she is having trouble with speech hands and arms.   She is also falling ,   She is not sure which Dr to call for this  Pt phone number is [phone]

## 2014-11-18 ENCOUNTER — Telehealth: Payer: Self-pay | Admitting: Neurology

## 2014-11-18 NOTE — Telephone Encounter (Signed)
MRI of Brain has been ordered by DR Birdie Riddle for this test per insurance  Patient is having hand numbness and weakness

## 2014-11-18 NOTE — Telephone Encounter (Signed)
Yes this is a possible  Flare of MS we need the MRI with and without

## 2014-11-18 NOTE — Telephone Encounter (Signed)
MRI order entered.

## 2014-11-18 NOTE — Telephone Encounter (Signed)
MRI is sch for 12-05-14 at 6:40 pt phone [phone]

## 2014-11-18 NOTE — Addendum Note (Signed)
Addended by: Midge Minium on: 11/18/2014 12:22 PM   Modules accepted: Orders

## 2014-11-18 NOTE — Telephone Encounter (Signed)
Pt just had MRI w/ and w/o contrast done in September to determine that she had MS.  Does she need another imaging study done or is this an MS flare.  I will be happy to order but I have not seen the pt for this and need to make sure it is appropriate and cost effective.

## 2014-11-18 NOTE — Telephone Encounter (Signed)
Patient needs an mri because she is having falls.  Hand and arm numbness and weakness.  Dr Tomi Likens cannot order the MRI as her insurance [employer].  [id number].  Please order mri of the brain w and wo contrast.  She wants it at [facility] on Friday.  [facility] if not at [facility]

## 2014-11-18 NOTE — Telephone Encounter (Signed)
Patient is aware of MRI order the telephone number to [facility] was given [phone] to schedule the appt . She is not in office so I am unable to answer the questions they ask

## 2014-11-19 NOTE — Telephone Encounter (Signed)
MRI schedule for 12/05/14

## 2014-11-20 ENCOUNTER — Telehealth: Payer: Self-pay | Admitting: *Deleted

## 2014-11-20 NOTE — Telephone Encounter (Signed)
Medical record request received via mail from [place]. Forwarded to [facility] at [place]. JG//CMA

## 2014-11-29 ENCOUNTER — Encounter: Payer: Self-pay | Admitting: Family Medicine

## 2014-11-29 ENCOUNTER — Encounter: Payer: 59 | Admitting: Family Medicine

## 2014-11-29 ENCOUNTER — Ambulatory Visit (INDEPENDENT_AMBULATORY_CARE_PROVIDER_SITE_OTHER): Payer: 59 | Admitting: Family Medicine

## 2014-11-29 VITALS — BP 123/95 | HR 98 | Temp 99.2°F | Resp 16 | Wt 120.5 lb

## 2014-11-29 DIAGNOSIS — J1189 Influenza due to unidentified influenza virus with other manifestations: Secondary | ICD-10-CM

## 2014-11-29 DIAGNOSIS — R6889 Other general symptoms and signs: Secondary | ICD-10-CM

## 2014-11-29 DIAGNOSIS — J111 Influenza due to unidentified influenza virus with other respiratory manifestations: Secondary | ICD-10-CM | POA: Insufficient documentation

## 2014-11-29 LAB — POCT INFLUENZA A/B
Influenza A, POC: POSITIVE
Influenza B, POC: NEGATIVE

## 2014-11-29 MED ORDER — ONDANSETRON 4 MG PO TBDP: 4.0000 mg | ORAL_TABLET | Freq: Three times a day (TID) | ORAL | Status: DC | PRN

## 2014-11-29 MED ORDER — OSELTAMIVIR PHOSPHATE 75 MG PO CAPS: 75.0000 mg | ORAL_CAPSULE | Freq: Two times a day (BID) | ORAL | Status: DC

## 2014-11-29 MED ORDER — AMOXICILLIN 875 MG PO TABS: 875.0000 mg | ORAL_TABLET | Freq: Two times a day (BID) | ORAL | Status: DC

## 2014-11-29 MED ORDER — PROMETHAZINE-DM 6.25-15 MG/5ML PO SYRP: 5.0000 mL | ORAL_SOLUTION | Freq: Four times a day (QID) | ORAL | Status: DC | PRN

## 2014-11-29 NOTE — Assessment & Plan Note (Signed)
Pt's sxs and PE consistent w/ flu w/ sinusitis.  Start Tamiflu for flu.  Amox for sinusitis.  Cough meds prn.  Reviewed supportive care and red flags that should prompt return.  Pt expressed understanding and is in agreement w/ plan.

## 2014-11-29 NOTE — Progress Notes (Signed)
Pre visit review using our clinic review tool, if applicable. No additional management support is needed unless otherwise documented below in the visit note. 

## 2014-11-29 NOTE — Progress Notes (Signed)
   Subjective:    Patient ID: Stacy Logan, female    DOB: [birth year], 49 y.o.   MRN: [id number]  HPI Fever- 'this is the sickest i've ever been'.  Started Wednesday, Tm 103.  Taking Dayquil and Nyquil along w/ cough meds but 'nothing is helping me'.  'i've coughed so much my ribs are going to break'.  + SOB, HAs, body aches, skin hurts to touch.  No known sick contacts.  Mild sinus pain/pressure.  + vomiting- 'more like dry heaves'.   Review of Systems For ROS see HPI     Objective:   Physical Exam  Constitutional: She appears well-developed and well-nourished. No distress.  Obviously not feeling well  HENT:  Head: Normocephalic and atraumatic.  Right Ear: Tympanic membrane normal.  Left Ear: Tympanic membrane normal.  Nose: Mucosal edema and rhinorrhea present. Right sinus exhibits maxillary sinus tenderness and frontal sinus tenderness. Left sinus exhibits maxillary sinus tenderness and frontal sinus tenderness.  Mouth/Throat: Uvula is midline and mucous membranes are normal. Posterior oropharyngeal erythema present. No oropharyngeal exudate.  Eyes: Conjunctivae and EOM are normal. Pupils are equal, round, and reactive to light.  Neck: Normal range of motion. Neck supple.  Cardiovascular: Normal rate, regular rhythm and normal heart sounds.   Pulmonary/Chest: Effort normal and breath sounds normal. No respiratory distress. She has no wheezes.  + dry cough  Lymphadenopathy:    She has no cervical adenopathy.  Skin: Skin is warm.  Vitals reviewed.         Assessment & Plan:

## 2014-11-29 NOTE — Patient Instructions (Signed)
Follow up as needed Start the Tamiflu twice daily Take the Amoxicillin twice daily for the sinus infection Use the cough syrup as needed- will cause drowsiness Drink plenty of fluids REST! Use the Zofran as needed (will dissolve under your tongue) Call with any questions or concerns Hang in there!!!

## 2014-12-03 ENCOUNTER — Telehealth: Payer: Self-pay | Admitting: Neurology

## 2014-12-03 NOTE — Telephone Encounter (Signed)
Sent over disability  Determination services to medical records

## 2014-12-05 ENCOUNTER — Telehealth: Payer: Self-pay | Admitting: Family Medicine

## 2014-12-05 ENCOUNTER — Other Ambulatory Visit: Payer: 59

## 2014-12-05 DIAGNOSIS — N3010 Interstitial cystitis (chronic) without hematuria: Secondary | ICD-10-CM

## 2014-12-05 NOTE — Telephone Encounter (Signed)
Caller name:Chance, [name] Relation to YQ:IHKV Call back [place]:  Reason for call: pt is needing a referral to dr. Louis Meckel, urologist, pt already established with the dr. Abbott Pao has [employer], pt needs new referral each year.

## 2014-12-05 NOTE — Telephone Encounter (Signed)
Referral placed.

## 2014-12-13 ENCOUNTER — Inpatient Hospital Stay: Admission: RE | Admit: 2014-12-13 | Payer: 59 | Source: Ambulatory Visit

## 2014-12-15 ENCOUNTER — Other Ambulatory Visit: Payer: Self-pay | Admitting: Neurology

## 2014-12-26 ENCOUNTER — Encounter: Payer: Self-pay | Admitting: Neurology

## 2014-12-26 ENCOUNTER — Ambulatory Visit (INDEPENDENT_AMBULATORY_CARE_PROVIDER_SITE_OTHER): Payer: 59 | Admitting: Neurology

## 2014-12-26 DIAGNOSIS — Z9889 Other specified postprocedural states: Secondary | ICD-10-CM | POA: Diagnosis not present

## 2014-12-26 DIAGNOSIS — M792 Neuralgia and neuritis, unspecified: Secondary | ICD-10-CM | POA: Diagnosis not present

## 2014-12-26 DIAGNOSIS — F418 Other specified anxiety disorders: Secondary | ICD-10-CM | POA: Diagnosis not present

## 2014-12-26 DIAGNOSIS — F419 Anxiety disorder, unspecified: Secondary | ICD-10-CM

## 2014-12-26 DIAGNOSIS — F329 Major depressive disorder, single episode, unspecified: Secondary | ICD-10-CM

## 2014-12-26 DIAGNOSIS — G35 Multiple sclerosis: Secondary | ICD-10-CM

## 2014-12-26 DIAGNOSIS — F32A Depression, unspecified: Secondary | ICD-10-CM

## 2014-12-26 LAB — COMPLETE METABOLIC PANEL WITH GFR
ALT: 28 U/L (ref 0–35)
AST: 28 U/L (ref 0–37)
Albumin: 4.3 g/dL (ref 3.5–5.2)
Alkaline Phosphatase: 77 U/L (ref 39–117)
BILIRUBIN TOTAL: 0.6 mg/dL (ref 0.2–1.2)
BUN: 13 mg/dL (ref 6–23)
CALCIUM: 9.1 mg/dL (ref 8.4–10.5)
CO2: 27 mEq/L (ref 19–32)
Chloride: 105 mEq/L (ref 96–112)
Creat: 0.55 mg/dL (ref 0.50–1.10)
Glucose, Bld: 98 mg/dL (ref 70–99)
POTASSIUM: 4.6 meq/L (ref 3.5–5.3)
Sodium: 139 mEq/L (ref 135–145)
TOTAL PROTEIN: 6.9 g/dL (ref 6.0–8.3)

## 2014-12-26 LAB — CBC WITH DIFFERENTIAL/PLATELET
Basophils Absolute: 0 10*3/uL (ref 0.0–0.1)
Basophils Relative: 0 % (ref 0–1)
Eosinophils Absolute: 0.1 10*3/uL (ref 0.0–0.7)
Eosinophils Relative: 1 % (ref 0–5)
HEMATOCRIT: 34.6 % — AB (ref 36.0–46.0)
Hemoglobin: 11.4 g/dL — ABNORMAL LOW (ref 12.0–15.0)
LYMPHS PCT: 14 % (ref 12–46)
Lymphs Abs: 0.8 10*3/uL (ref 0.7–4.0)
MCH: 27.7 pg (ref 26.0–34.0)
MCHC: 32.9 g/dL (ref 30.0–36.0)
MCV: 84.2 fL (ref 78.0–100.0)
MONO ABS: 0.5 10*3/uL (ref 0.1–1.0)
MPV: 9.1 fL (ref 8.6–12.4)
Monocytes Relative: 9 % (ref 3–12)
NEUTROS PCT: 76 % (ref 43–77)
Neutro Abs: 4.4 10*3/uL (ref 1.7–7.7)
PLATELETS: 192 10*3/uL (ref 150–400)
RBC: 4.11 MIL/uL (ref 3.87–5.11)
RDW: 16.9 % — ABNORMAL HIGH (ref 11.5–15.5)
WBC: 5.8 10*3/uL (ref 4.0–10.5)

## 2014-12-26 NOTE — Patient Instructions (Signed)
1.  Continue tecfedera 2.  Check CBC with diff and CMP 3.  Start gabapentin 300mg  capsules for nerve pain.  Take 1 capsule at bedtime for 7 days, then 1 capsule twice daily for 7 days, then 1 capsule 3 times daily 4.  Have the MRI of the brain and cervical spine with and without contrast 5.  Will refer you to a counselor for depression 6.  Follow up in 3 months.

## 2014-12-26 NOTE — Progress Notes (Addendum)
NEUROLOGY FOLLOW UP OFFICE NOTE  Stacy Logan [id number]  HISTORY OF PRESENT ILLNESS: Stacy Logan is a 49 year old right-handed woman with history of depression, multiple sclerosis and chronic pelvic pain, and overactive bladder who follows up for relapsing-remitting MS and migraines.  Records reviewed.  UPDATE: She was started on Tecfedera in November.  She sometimes develops a rash with it, but she says it is manageable and doesn't want to change it.  She has been dealing with increased stress and depression related to her recent diagnoses, physical symptoms and inability to work and earn an income.  This stress causes her to have exacerbations of chronic symptoms.  She notes some weakness.  Gait is sometimes an issue, particularly when her left leg feels weak.  She slurs her words and stutters more frequently.  She is having some short-term memory problems and will often repeat questions.  She also notes tremor, particularly in the right hand.  Since the spinal surgery, she has had significant neuropathic pain in the hands and arms, associated with allodynia.  She cannot use her hands and feels very weak.  She is unable to hold things and cannot work.  Her neck pain has improved.  She is no longer taking Cymbalta as it is too expensive.  She is not taking Toviaz for overactive bladder but has an upcoming appointment with urology.  For migraines, she is taking amitriptyline 25mg  daily.  It is controlled.  She no longer takes sumatriptan  HISTORY: In 1998, she developed hearing loss in her left ear.  She saw ENT, who ordered an MRI of the brain, which revealed "plaques."  She was referred to a neurologist at that time, who recommended following up in 2 years with another MRI.  In the interim, she did not exhibit any new symptoms, but she never regained her hearing in the left ear.  She did have a follow up MRI with the neurologist, who suggested following up with another MRI in a couple of  years.  She never had any other workup performed.  Over the past 3 years, she began experiencing "flare-ups."  During these flare-ups, she will have frequent falls, like her legs giving out from under her.  Her legs feel extremely heavy.  She also experiences weight loss during these spells, although her appetite is good.  Sometimes, she has blurred vision.  She reports that she sometimes uses the wrong words when she speaks.  During these spells, she has an increase in migraines.  These flare-ups usually last 3-4 weeks and occur every 4 to 6 months.  However, she has been experiencing symptoms for the past two months.  She reports weight loss with flare-ups.  Flare-ups occur in different seasons.  She has chronic symptoms as well.  She reports fatigue. She has urinary frequency.  She does report some constipation, which she attributes to medication side effects.  She reports muscle spasms in the legs.  She reports numbness in the 4th and 5th digits of both hands, which she attributes to a slipped disc in her neck after a fall about 5 years ago.  She also reports short-term memory problems.  Sometimes, she feels that she stutters or says the wrong word.  She began having migraines following her pregnancy in 1994, when she had pre-eclampsia.  It starts on the top of her head and then spreads to involve the entire head until it is a holocephalic vice-like pressure.  It is associated with nausea, vomiting,  photophobia, phonophobia, osmophobia, and blurred vision.  There is no aura.  It usually lasts 4-5 hours and occurs 1 to 2 times per month. She was started on Imitrex, which helps.  She was evaluated by GI for her weight loss.  Colonoscopy revealed some polyps but no obvious cause was found.   She denies family history of MS.  Her sister has migraines.  MRI of the brain with and without contrast was performed on 05/22/14, which revealed multiple non-enhancing bilateral periventricular and subcortical  hyperintensities.  MRI of the cervical spine with and without contrast showed multilevel disc degeneration with severe spinal stenosis with right greater than left foraminal stenosis at C5-6, as well as moderate spinal stenosis at C3-4 and C4-5.  6 mm enhancing focus in seen in the spinal cord at C5-6, as well as subtle enhancing lesion at C5.  An incidental 1.6 cm right thyroid nodule was noted.  Due to the cervical spondylosis and myelopathy, she was referred to Dr. Arnoldo Morale at [facility].  As at least some of her symptoms are probably related to this, she underwent anterior cervical decompression, discectomy, fusion and plating at C3-4, C4-5, C5-6 and C6-7 on 07/03/14.  Post-surgical cervical spine films showed C3-C7 ACDF.    Labs from 06/05/14 showed ANA negative, Sed Rate 4, ANCA negative, SSA/SSB antibodies negative.  She did not have the LP performed due to the severe cervical stenosis, however it is not needed as imaging satisfies McDonald Criteria.  PAST MEDICAL HISTORY: Past Medical History  Diagnosis Date  . Arthritis   . Depression   . Migraines   . History of pneumothorax     1988-- SPONTANEOUS--  RESOLVED W/ CHEST TUBE  . Lesion of bladder   . H/O cold sores   . Borderline diabetes   . Frequency of urination   . Urgency of urination   . Nocturia   . Seasonal allergies   . Anal fissure   . Anxiety   . IC (interstitial cystitis)   . Pre-eclampsia   . MS (multiple sclerosis)     MRI showed plague on her brain    MEDICATIONS: Current Outpatient Prescriptions on File Prior to Visit  Medication Sig Dispense Refill  . ALPRAZolam (XANAX) 0.5 MG tablet TAKE ONE TABLET BY MOUTH TWICE DAILY AS NEEDED 60 tablet 0  . amitriptyline (ELAVIL) 25 MG tablet TAKE TWO TABLETS BY MOUTH AT BEDTIME 60 tablet 0  . baclofen (LIORESAL) 10 MG tablet Take 0.5 tablets (5 mg total) by mouth 3 (three) times daily. 30 each 2  . valACYclovir (VALTREX) 1000 MG tablet Take 2,000 mg by mouth as  directed. 2 grams Q12 x2 doses prn cold sores    . amoxicillin (AMOXIL) 875 MG tablet Take 1 tablet (875 mg total) by mouth 2 (two) times daily. (Patient not taking: Reported on 12/26/2014) 20 tablet 0  . benzoyl peroxide 5 % gel Apply topically daily. (Patient not taking: Reported on 12/26/2014) 90 g 3  . Diazepam (VALIUM PO) Take by mouth.    . Dimethyl Fumarate (TECFIDERA) 240 MG CPDR Take 1 capsule by mouth 2 (two) times daily.    Marland Kitchen docusate sodium (COLACE) 100 MG capsule Take 1 capsule (100 mg total) by mouth 2 (two) times daily as needed (take to keep stool soft.). (Patient not taking: Reported on 12/26/2014) 60 capsule 0  . DULoxetine (CYMBALTA) 20 MG capsule Take 2 capsules (40 mg total) by mouth daily. (Patient not taking: Reported on 12/26/2014) 60 capsule 6  .  fesoterodine (TOVIAZ) 8 MG TB24 tablet Take 1 tablet (8 mg total) by mouth daily. (Patient not taking: Reported on 12/26/2014) 30 tablet 5  . lidocaine (XYLOCAINE) 2 % solution Use as directed 20 mLs in the mouth or throat as needed for mouth pain. (Patient not taking: Reported on 12/26/2014) 100 mL 0  . mupirocin ointment (BACTROBAN) 2 % Place 1 application into the nose 2 (two) times daily. (Patient not taking: Reported on 12/26/2014) 22 g 0  . naphazoline (NAPHCON) 0.1 % ophthalmic solution Place 1 drop into both eyes 4 (four) times daily as needed for irritation.    Marland Kitchen nystatin (MYCOSTATIN) 100000 UNIT/ML suspension Take 5 mLs (500,000 Units total) by mouth 4 (four) times daily. (Patient not taking: Reported on 12/26/2014) 240 mL 0  . ondansetron (ZOFRAN ODT) 4 MG disintegrating tablet Take 1 tablet (4 mg total) by mouth every 8 (eight) hours as needed for nausea or vomiting. (Patient not taking: Reported on 12/26/2014) 30 tablet 0  . oseltamivir (TAMIFLU) 75 MG capsule Take 1 capsule (75 mg total) by mouth 2 (two) times daily. (Patient not taking: Reported on 12/26/2014) 10 capsule 0  . promethazine-dextromethorphan (PROMETHAZINE-DM) 6.25-15 MG/5ML  syrup Take 5 mLs by mouth 4 (four) times daily as needed. (Patient not taking: Reported on 12/26/2014) 240 mL 0  . SUMAtriptan (IMITREX) 100 MG tablet Take 100 mg by mouth every 2 (two) hours as needed for migraine. May repeat in 2 hours if headache persists or recurs.     No current facility-administered medications on file prior to visit.    ALLERGIES: Allergies  Allergen Reactions  . Codeine     Makes her "busy", itchy  . Meperidine Hcl Nausea And Vomiting    SEVERE N/V  . Morphine Other (See Comments)    "SKIN CRAWLS"    FAMILY HISTORY: Family History  Problem Relation Age of Onset  . Hypertension Mother   . Diabetes Father   . Prostate cancer Father   . Lung cancer Father   . Cancer Father     cheek of mouth  . Diabetes Paternal Grandmother   . Diabetes Paternal Grandfather   . Diabetes Paternal Uncle     x 4  . Diabetes Paternal Aunt   . Colon cancer Maternal Uncle   . Esophageal cancer Neg Hx   . Rectal cancer Neg Hx   . Stomach cancer Neg Hx     SOCIAL HISTORY: History   Social History  . Marital Status: Single    Spouse Name: N/A  . Number of Children: 1  . Years of Education: N/A   Occupational History  . self-employed house cleaning    Social History Main Topics  . Smoking status: Former Smoker -- 0.50 packs/day for 20 years    Types: Cigarettes    Quit date: 01/17/2011  . Smokeless tobacco: Never Used  . Alcohol Use: No  . Drug Use: Not on file  . Sexual Activity: No   Other Topics Concern  . Not on file   Social History Narrative    REVIEW OF SYSTEMS: Constitutional: No fevers, chills, or sweats, no generalized fatigue, change in appetite Eyes: No visual changes, double vision, eye pain Ear, nose and throat: No hearing loss, ear pain, nasal congestion, sore throat Cardiovascular: No chest pain, palpitations Respiratory:  No shortness of breath at rest or with exertion, wheezes GastrointestinaI: No nausea, vomiting, diarrhea, abdominal  pain, fecal incontinence Genitourinary:  No dysuria, urinary retention or frequency Musculoskeletal:  No  neck pain, back pain Integumentary: No rash, pruritus, skin lesions Neurological: as above Psychiatric: No depression, insomnia, anxiety Endocrine: No palpitations, fatigue, diaphoresis, mood swings, change in appetite, change in weight, increased thirst Hematologic/Lymphatic:  No anemia, purpura, petechiae. Allergic/Immunologic: no itchy/runny eyes, nasal congestion, recent allergic reactions, rashes  PHYSICAL EXAM: Filed Vitals:   12/26/14 0940  BP: 120/80  Pulse: 66  Resp: 16   General: No acute distress Head:  Normocephalic/atraumatic Eyes:  Fundoscopic exam unremarkable without vessel changes, exudates, hemorrhages or papilledema. Neck: supple, no paraspinal tenderness, full range of motion Heart:  Regular rate and rhythm Lungs:  Clear to auscultation bilaterally Back: No paraspinal tenderness Neurological Exam: alert and oriented to person, place, and time, recent and remote memory intact, fund of knowledge intact, attention and concentration intact, speech fluent and not dysarthric, language intact.  CN II-XII intact.  Fundi not able to be visualized.  Increased tone.  Muscle strength 4+ in both hand grips.  Otherwise, 5/5 with mild give-away weakness.  Reduced pinprick sensation in the right forearm.  Reduced vibration in the toes of right foot.  Reduced vibration in the fingers and left foot. DTR 3+ throughout.  She exhibits brief tremor in either hand (right more than left), which is not positional and abruptly stops spontaneously.  Normal station and stride.  Able to turn, walk on toes, heels and in tandem.  Timed 25-Foot Walk 5.5 seconds.  Romberg with sway.  IMPRESSION: Multiple sclerosis.  Unclear type but probably relapsing-remitting.  It is actually pretty stable clinically Neurologic pain syndrome as sequela of cervical ACDF.  This is currently her major concern which  is causing the extreme pain in the arms and hands. Depression and anxiety.  This is an issue and is causing pseudo-exacerbation of symptoms.  Some of her symptoms, such as the tremor and probably the stuttering is likely somatization.   Migraine without aura, controlled.  PLAN: 1.  Continue Tecfedera 2.  Will check CBC with diff and CMP 3.  MRI of brain and cervical spine with and without contrast 4.  Will try gabapentin, titrating to 300mg  three times daily for neuralgia in arms and hands. 5.  Refer to Behavioral Health to address depression and anxiety 6.  Amitriptyline 25mg  daily for migraine 7.  Follow up in 6 months.  30 minutes spent with patient, over 50% spent discussing management and diagnoses  Metta Clines, DO  CC:  Annye Asa, MD

## 2014-12-27 ENCOUNTER — Other Ambulatory Visit: Payer: Self-pay | Admitting: Neurology

## 2014-12-30 ENCOUNTER — Other Ambulatory Visit: Payer: Self-pay | Admitting: Neurology

## 2014-12-30 ENCOUNTER — Telehealth: Payer: Self-pay | Admitting: *Deleted

## 2014-12-30 NOTE — Telephone Encounter (Signed)
-----   Message from Pieter Partridge, DO sent at 12/27/2014  6:42 AM EDT ----- Labs look okay except for very mild anemia but nothing signifiicant. ----- Message -----    From: Lab in [facility] Interface    Sent: 12/26/2014   9:07 PM      To: Pieter Partridge, DO

## 2014-12-30 NOTE — Telephone Encounter (Signed)
Patient is aware that Labs look okay except for very mild anemia but nothing signifiicant.

## 2014-12-31 ENCOUNTER — Telehealth: Payer: Self-pay | Admitting: Neurology

## 2014-12-31 NOTE — Telephone Encounter (Signed)
Please instruct on how you would like patient to take Gabapentin the RX was not sent to pharmacy at last office visit

## 2014-12-31 NOTE — Telephone Encounter (Signed)
Pt called stating that her pharmacy, [facility] on [address] has not received the script for GABAPENTIN 300mg . C/b [id number]

## 2015-01-01 ENCOUNTER — Other Ambulatory Visit: Payer: Self-pay | Admitting: *Deleted

## 2015-01-01 MED ORDER — GABAPENTIN 300 MG PO CAPS: ORAL_CAPSULE | ORAL | Status: DC

## 2015-01-01 NOTE — Telephone Encounter (Signed)
Medication have been sent to [facility]

## 2015-01-01 NOTE — Telephone Encounter (Signed)
300mg  at bedtime for 7 days, then 300mg  twice daily for 7 days, then 300mg  three times daily

## 2015-01-08 ENCOUNTER — Other Ambulatory Visit: Payer: Self-pay | Admitting: Family Medicine

## 2015-01-08 NOTE — Telephone Encounter (Signed)
Last OV 3-11-6 Alprazolam last filled #30 with 0  No UDS or CSC

## 2015-01-08 NOTE — Telephone Encounter (Signed)
Will refill but needs CSC and baseline UDS

## 2015-01-08 NOTE — Telephone Encounter (Signed)
Med filled and faxed.  

## 2015-01-12 ENCOUNTER — Ambulatory Visit
Admission: RE | Admit: 2015-01-12 | Discharge: 2015-01-12 | Disposition: A | Discharge status: Home / Self Care | Payer: 59 | Source: Ambulatory Visit | Attending: Family Medicine | Admitting: Family Medicine

## 2015-01-12 DIAGNOSIS — G35 Multiple sclerosis: Secondary | ICD-10-CM

## 2015-01-12 DIAGNOSIS — R296 Repeated falls: Secondary | ICD-10-CM

## 2015-01-12 DIAGNOSIS — R29898 Other symptoms and signs involving the musculoskeletal system: Secondary | ICD-10-CM

## 2015-01-12 MED ORDER — GADOBENATE DIMEGLUMINE 529 MG/ML IV SOLN
10.0000 mL | Freq: Once | INTRAVENOUS | Status: AC | PRN
  Administered 2015-01-12: 10 mL via INTRAVENOUS

## 2015-01-17 ENCOUNTER — Other Ambulatory Visit: Payer: Self-pay | Admitting: Neurology

## 2015-01-27 ENCOUNTER — Telehealth: Payer: Self-pay | Admitting: Neurology

## 2015-01-27 NOTE — Telephone Encounter (Signed)
Patient is asking for  Results of MRI she states they were done about 2 weeks ago . Please see below  Message  And advise

## 2015-01-27 NOTE — Telephone Encounter (Signed)
Pt says she had MRI about 2 weeks ago and has not gotten a call back. She needs results of MRI. Also says her arms and hands continue to be painful and migraines have restarted. She reports she did not get a MRI of the nec/spine and wonders if this needs to be done. Please call back at [phone] / Sherri S.

## 2015-01-27 NOTE — Telephone Encounter (Signed)
MRI of brain is stable.  I don't think we need to get imaging of the spine.  I think the arm pain is related to the surgery.  If she would like, we can increase amitriptyline to 50mg  at bedtime to address both the migraines and nerve pain.

## 2015-01-27 NOTE — Telephone Encounter (Signed)
Patient is aware of MRI results and amitriptyline increase  If she feels that would help .

## 2015-02-10 ENCOUNTER — Telehealth: Payer: Self-pay | Admitting: Neurology

## 2015-02-10 NOTE — Telephone Encounter (Signed)
Pt called and is ready to up her prescription for amitriptyline please advise

## 2015-02-10 NOTE — Telephone Encounter (Signed)
Pt called and is ready to up her prescription for amitriptyline/Dawn

## 2015-02-13 ENCOUNTER — Other Ambulatory Visit: Payer: Self-pay | Admitting: Neurology

## 2015-03-12 ENCOUNTER — Other Ambulatory Visit: Payer: Self-pay | Admitting: General Practice

## 2015-03-12 MED ORDER — VALACYCLOVIR HCL 1 G PO TABS: ORAL_TABLET | ORAL | Status: DC

## 2015-03-19 ENCOUNTER — Other Ambulatory Visit: Payer: Self-pay | Admitting: Neurology

## 2015-03-26 ENCOUNTER — Other Ambulatory Visit: Payer: Self-pay | Admitting: Family Medicine

## 2015-03-26 ENCOUNTER — Other Ambulatory Visit: Payer: Self-pay | Admitting: Neurology

## 2015-03-26 ENCOUNTER — Telehealth: Payer: Self-pay | Admitting: Family Medicine

## 2015-03-26 DIAGNOSIS — G35 Multiple sclerosis: Secondary | ICD-10-CM

## 2015-03-26 DIAGNOSIS — M4712 Other spondylosis with myelopathy, cervical region: Secondary | ICD-10-CM

## 2015-03-26 DIAGNOSIS — M4722 Other spondylosis with radiculopathy, cervical region: Secondary | ICD-10-CM

## 2015-03-26 NOTE — Telephone Encounter (Signed)
Caller name: Taira Relation to pt: Call back number: [phone] Pharmacy:  Reason for call:   Patient is requesting a referral to Dr. Clydell Hakim. She has an appointment this afternoon. Has [employer].

## 2015-03-26 NOTE — Telephone Encounter (Signed)
Caller name: Inis Sizer Relationship to patient: pts sister Can be reached: Please call pt directly, Stacy Logan at [phone]  Reason for call: Pts sister called in very upset that they went to Pain Mgmt appt today with Dr. Maryjean Ka, and were not able to be seen. Pt has MS and is in a great deal of pain among other issues. [name] takes off work to take her to most appts and many of them they have not been seen. The reasons given from the providers is that we did not provide referral and documentation. Today pt was scheduled with Dr. Maryjean Ka for 1:15pm. They were not seen and were told the office tried contacting us until 2:00pm today to obtain authorization. She stated that the neurosurgeon referred them to Dr. Maryjean Ka. It appears we were unaware until phone call this morning (see phone note from 9:59am). The referral was entered and auth obtained by 10:35am. I notifed [name] of this. I'm not sure of process for referral and what is sent/faxed after [name] is received. She would like to know if we faxed documents and what time this was done.   She states they went to Opthamology 05/2014 and ended up paying out of pocket because they didn't get the referral. She states they had 2 Urology appts and still have not been seen because referrals were not sent before appts.  They want to know why the referrals are not being placed and information being sent in a timely manner for pt to be seen.

## 2015-03-26 NOTE — Telephone Encounter (Signed)
Tried to call pt to verify where provider is located and what the appt is for, voicemail had not been set up. Googled provider and came up with neurosurgery.

## 2015-03-27 ENCOUNTER — Telehealth: Payer: Self-pay

## 2015-03-27 NOTE — Telephone Encounter (Signed)
Pre visit call completed 

## 2015-03-27 NOTE — Telephone Encounter (Signed)
Last OV 11-29-14 (flu-like symptoms) Alprazolam last filled 01-08-15 #60 with 1

## 2015-03-27 NOTE — Telephone Encounter (Signed)
Med filled and faxed.  

## 2015-03-28 ENCOUNTER — Ambulatory Visit (INDEPENDENT_AMBULATORY_CARE_PROVIDER_SITE_OTHER): Payer: 59 | Admitting: Family Medicine

## 2015-03-28 ENCOUNTER — Encounter: Payer: Self-pay | Admitting: Family Medicine

## 2015-03-28 ENCOUNTER — Other Ambulatory Visit: Payer: Self-pay | Admitting: General Practice

## 2015-03-28 VITALS — BP 122/82 | HR 88 | Temp 98.0°F | Resp 16 | Ht 71.0 in | Wt 126.0 lb

## 2015-03-28 DIAGNOSIS — M4722 Other spondylosis with radiculopathy, cervical region: Secondary | ICD-10-CM

## 2015-03-28 DIAGNOSIS — H538 Other visual disturbances: Secondary | ICD-10-CM

## 2015-03-28 DIAGNOSIS — F418 Other specified anxiety disorders: Secondary | ICD-10-CM | POA: Diagnosis not present

## 2015-03-28 DIAGNOSIS — G35 Multiple sclerosis: Secondary | ICD-10-CM

## 2015-03-28 DIAGNOSIS — M4712 Other spondylosis with myelopathy, cervical region: Secondary | ICD-10-CM

## 2015-03-28 DIAGNOSIS — Z0000 Encounter for general adult medical examination without abnormal findings: Secondary | ICD-10-CM

## 2015-03-28 DIAGNOSIS — Z01419 Encounter for gynecological examination (general) (routine) without abnormal findings: Secondary | ICD-10-CM

## 2015-03-28 DIAGNOSIS — N3010 Interstitial cystitis (chronic) without hematuria: Secondary | ICD-10-CM

## 2015-03-28 DIAGNOSIS — R52 Pain, unspecified: Secondary | ICD-10-CM | POA: Diagnosis not present

## 2015-03-28 DIAGNOSIS — R29898 Other symptoms and signs involving the musculoskeletal system: Secondary | ICD-10-CM | POA: Diagnosis not present

## 2015-03-28 LAB — HEPATIC FUNCTION PANEL
ALBUMIN: 4.1 g/dL (ref 3.5–5.2)
ALT: 17 U/L (ref 0–35)
AST: 23 U/L (ref 0–37)
Alkaline Phosphatase: 91 U/L (ref 39–117)
BILIRUBIN TOTAL: 0.3 mg/dL (ref 0.2–1.2)
Bilirubin, Direct: 0 mg/dL (ref 0.0–0.3)
Total Protein: 7.1 g/dL (ref 6.0–8.3)

## 2015-03-28 LAB — CBC WITH DIFFERENTIAL/PLATELET
BASOS ABS: 0 10*3/uL (ref 0.0–0.1)
BASOS PCT: 0.4 % (ref 0.0–3.0)
EOS ABS: 0.1 10*3/uL (ref 0.0–0.7)
Eosinophils Relative: 1.3 % (ref 0.0–5.0)
HCT: 33.9 % — ABNORMAL LOW (ref 36.0–46.0)
Hemoglobin: 11.1 g/dL — ABNORMAL LOW (ref 12.0–15.0)
LYMPHS ABS: 0.6 10*3/uL — AB (ref 0.7–4.0)
Lymphocytes Relative: 8.4 % — ABNORMAL LOW (ref 12.0–46.0)
MCHC: 32.7 g/dL (ref 30.0–36.0)
MCV: 83.1 fl (ref 78.0–100.0)
MONO ABS: 0.4 10*3/uL (ref 0.1–1.0)
Monocytes Relative: 5.6 % (ref 3.0–12.0)
Neutro Abs: 6.4 10*3/uL (ref 1.4–7.7)
Neutrophils Relative %: 84.3 % — ABNORMAL HIGH (ref 43.0–77.0)
Platelets: 203 10*3/uL (ref 150.0–400.0)
RBC: 4.09 Mil/uL (ref 3.87–5.11)
RDW: 18.9 % — ABNORMAL HIGH (ref 11.5–15.5)
WBC: 7.7 10*3/uL (ref 4.0–10.5)

## 2015-03-28 LAB — VITAMIN D 25 HYDROXY (VIT D DEFICIENCY, FRACTURES): VITD: 26.82 ng/mL — ABNORMAL LOW (ref 30.00–100.00)

## 2015-03-28 LAB — CK: Total CK: 83 U/L (ref 7–177)

## 2015-03-28 LAB — SEDIMENTATION RATE: SED RATE: 24 mm/h — AB (ref 0–22)

## 2015-03-28 LAB — BASIC METABOLIC PANEL
BUN: 20 mg/dL (ref 6–23)
CALCIUM: 9.6 mg/dL (ref 8.4–10.5)
CHLORIDE: 105 meq/L (ref 96–112)
CO2: 31 mEq/L (ref 19–32)
CREATININE: 0.61 mg/dL (ref 0.40–1.20)
GFR: 109.79 mL/min (ref >60.00)
GLUCOSE: 103 mg/dL — AB (ref 70–99)
POTASSIUM: 4.1 meq/L (ref 3.5–5.1)
Sodium: 141 mEq/L (ref 135–145)

## 2015-03-28 LAB — TSH: TSH: 0.99 u[IU]/mL (ref 0.35–4.50)

## 2015-03-28 LAB — B12 AND FOLATE PANEL
Folate: 12.7 ng/mL (ref >5.9)
VITAMIN B 12: 154 pg/mL — AB (ref 211–911)

## 2015-03-28 MED ORDER — VITAMIN D (ERGOCALCIFEROL) 1.25 MG (50000 UNIT) PO CAPS: 50000.0000 [IU] | ORAL_CAPSULE | ORAL | Status: DC

## 2015-03-28 NOTE — Progress Notes (Signed)
Pre visit review using our clinic review tool, if applicable. No additional management support is needed unless otherwise documented below in the visit note. 

## 2015-03-28 NOTE — Patient Instructions (Signed)
Reschedule your physical We'll notify you of your lab results I put in all new referrals for you (Neurology, Neurosurgery, Urology, Ophthalmology, GYN, Rheum, psychiatry) Please voice your concerns and frustrations with your specialists- you have a right to better communication/answers Please call with any questions or concerns Hang in there!!!

## 2015-03-28 NOTE — Progress Notes (Signed)
   Subjective:    Patient ID: Stacy Logan, female    DOB: [birth year], 49 y.o.   MRN: [id number]  HPI  Pt presented to office today w/ 6 hand written pages of concerns  MS- Following w/ Dr Tomi Likens.  Is appreciative of care.  Wants to know if she needs nerve conduction/EMG.  Depression- ongoing issue for pt and complicating her numerous medical issues.  Has therapist, on alprazolam prn.  Numbness/weakness of arms- pt isn't sure if this is her MS vs her cervical disc issues.  Feels she isn't getting answers from Neurosurgery office.  Likes the MD, having difficulty w/ the office staff.  IC- chronic problem for pt.  Has referral to urology but needs new one so that she can be seen w/o insurance difficulty.  Blurry vision- saw eye doctor after being referred by Neuro.  Since referral did not come from this office, visits were not covered.  Pt was very unhappy w/ provider.  Wants new referral.  Arthritis pain- chronic problem for pt.  Wants referral to new endo for ongoing management.  GYN needs- pt needs GYN referral for routine care   Review of Systems For ROS see HPI     Objective:   Physical Exam  Constitutional: She is oriented to person, place, and time.  thin  HENT:  Head: Normocephalic and atraumatic.  Eyes: Conjunctivae and EOM are normal. Pupils are equal, round, and reactive to light.  Cardiovascular: Normal rate, regular rhythm, normal heart sounds and intact distal pulses.   Pulmonary/Chest: Effort normal and breath sounds normal. No respiratory distress. She has no wheezes. She has no rales.  Abdominal: Soft. Bowel sounds are normal. She exhibits no distension. There is no tenderness. There is no rebound and no guarding.  Neurological: She is alert and oriented to person, place, and time.  Skin: Skin is warm and dry. No rash noted. No erythema.  Psychiatric:  Tearful, tangential thought process  Vitals reviewed.         Assessment & Plan:

## 2015-04-01 ENCOUNTER — Encounter: Payer: Self-pay | Admitting: Neurology

## 2015-04-01 ENCOUNTER — Ambulatory Visit (INDEPENDENT_AMBULATORY_CARE_PROVIDER_SITE_OTHER): Payer: 59 | Admitting: Neurology

## 2015-04-01 ENCOUNTER — Telehealth: Payer: Self-pay | Admitting: Family Medicine

## 2015-04-01 VITALS — BP 102/80 | HR 110 | Resp 20 | Ht 71.0 in | Wt 123.3 lb

## 2015-04-01 DIAGNOSIS — E538 Deficiency of other specified B group vitamins: Secondary | ICD-10-CM | POA: Diagnosis not present

## 2015-04-01 DIAGNOSIS — N319 Neuromuscular dysfunction of bladder, unspecified: Secondary | ICD-10-CM

## 2015-04-01 DIAGNOSIS — G35 Multiple sclerosis: Secondary | ICD-10-CM

## 2015-04-01 DIAGNOSIS — D72810 Lymphocytopenia: Secondary | ICD-10-CM

## 2015-04-01 DIAGNOSIS — M4722 Other spondylosis with radiculopathy, cervical region: Secondary | ICD-10-CM

## 2015-04-01 DIAGNOSIS — M4712 Other spondylosis with myelopathy, cervical region: Secondary | ICD-10-CM | POA: Diagnosis not present

## 2015-04-01 NOTE — Progress Notes (Signed)
NEUROLOGY FOLLOW UP OFFICE NOTE  Stacy Logan [id number]  HISTORY OF PRESENT ILLNESS: Stacy Logan is a 49 year old right-handed woman with history of depression, multiple sclerosis and chronic pelvic pain, and overactive bladder who follows up for relapsing-remitting MS and migraines.  Records reviewed.  UPDATE: She was started on Tecfedera in November.  Since that time, her lymphocyte count has dropped from 3000 to 600.   For pain, she is taking gabapentin 300mg  three times daily and for spasms, she takes baclofen 5mg  three times daily as needed.  She doesn't find it helpful.  She is taking Vicodin for the neck and arm pain, as prescribed by Dr. Arnoldo Logan, her neurosurgeon, but that is not effective.  She is in severe pain.  She has been feeling mor fatigued.  She was found to have B12 deficiency and has started shots.  Last week, she was out in the sun and feels that the heat triggered a flare-up.  She reports increased heaviness in the legs, as well as pain and increased neurogenic bladder symptoms.  She is scheduled to see her urologist again.  She was referred by her PCP to rheumatology.  She also has been tearful and depressed.  She has been under more stress as she found out that her ex-husband was in a motorcycle accident.  Labs from 03/28/15 show WBC 7.7 with absolute lymphocyte count of 0.6, total bilirubin 0.3, direct bilirubin 0, Alk Phos 91, AST 23 and ALT 17.  B12 was deficient at 154.  Vitamin D was 26.82.  Sed Rate was 24.  TSH was 0.99 and CK was 83.  For migraines, she is taking amitriptyline 25mg  daily.  It is controlled.  She no longer takes sumatriptan  HISTORY: In 1998, she developed hearing loss in her left ear.  She saw ENT, who ordered an MRI of the brain, which revealed "plaques."  She was referred to a neurologist at that time, who recommended following up in 2 years with another MRI.  In the interim, she did not exhibit any new symptoms, but she never regained her  hearing in the left ear.  She did have a follow up MRI with the neurologist, who suggested following up with another MRI in a couple of years.  She never had any other workup performed.  Over the past 3 years, she began experiencing "flare-ups."  During these flare-ups, she will have frequent falls, like her legs giving out from under her.  Her legs feel extremely heavy.  She also experiences weight loss during these spells, although her appetite is good.  Sometimes, she has blurred vision.  She reports that she sometimes uses the wrong words when she speaks.  During these spells, she has an increase in migraines.  These flare-ups usually last 3-4 weeks and occur every 4 to 6 months.  However, she has been experiencing symptoms for the past two months.  She reports weight loss with flare-ups.  Flare-ups occur in different seasons.  She has chronic symptoms as well.  She reports fatigue. She has urinary frequency.  She does report some constipation, which she attributes to medication side effects.  She reports muscle spasms in the legs.  She reports numbness in the 4th and 5th digits of both hands, which she attributes to a slipped disc in her neck after a fall about 5 years ago.  She also reports short-term memory problems.  Sometimes, she feels that she stutters or says the wrong word.  She started  Tecfidera in November 2015.  She has had  She began having migraines following her pregnancy in 1994, when she had pre-eclampsia.  It starts on the top of her head and then spreads to involve the entire head until it is a holocephalic vice-like pressure.  It is associated with nausea, vomiting, photophobia, phonophobia, osmophobia, and blurred vision.  There is no aura.  It usually lasts 4-5 hours and occurs 1 to 2 times per month. She takes amitriptyline and sumatriptan if needed.  She was evaluated by GI for her weight loss.  Colonoscopy revealed some polyps but no obvious cause was found.   She denies family  history of MS.  Her sister has migraines.  MRI of the brain with and without contrast was performed on 05/22/14, which revealed multiple non-enhancing bilateral periventricular and subcortical hyperintensities.  MRI of the cervical spine with and without contrast showed multilevel disc degeneration with severe spinal stenosis with right greater than left foraminal stenosis at C5-6, as well as moderate spinal stenosis at C3-4 and C4-5.  6 mm enhancing focus in seen in the spinal cord at C5-6, as well as subtle enhancing lesion at C5.  An incidental 1.6 cm right thyroid nodule was noted.  Due to the cervical spondylosis and myelopathy, she was referred to Dr. Arnoldo Logan at [facility].  As at least some of her symptoms are probably related to this, she underwent anterior cervical decompression, discectomy, fusion and plating at C3-4, C4-5, C5-6 and C6-7 on 07/03/14.  Post-surgical cervical spine films showed C3-C7 ACDF.  Since the spinal surgery, she has had significant neuropathic pain in the hands and arms, associated with allodynia.  She cannot use her hands and feels very weak.  She is unable to hold things and cannot work.  Her neck pain has improved.  She is no longer taking Cymbalta as it is too expensive.  She is not taking Toviaz for overactive bladder but has an upcoming appointment with urology.  Labs from 06/05/14 showed ANA negative, Sed Rate 4, ANCA negative, SSA/SSB antibodies negative.  She did not have the LP performed due to the severe cervical stenosis, however it is not needed as imaging satisfies McDonald Criteria.  PAST MEDICAL HISTORY: Past Medical History  Diagnosis Date  . Arthritis   . Depression   . Migraines   . History of pneumothorax     1988-- SPONTANEOUS--  RESOLVED W/ CHEST TUBE  . Lesion of bladder   . H/O cold sores   . Borderline diabetes   . Frequency of urination   . Urgency of urination   . Nocturia   . Seasonal allergies   . Anal fissure   . Anxiety     . IC (interstitial cystitis)   . Pre-eclampsia   . MS (multiple sclerosis)     MRI showed plague on her brain    MEDICATIONS: Current Outpatient Prescriptions on File Prior to Visit  Medication Sig Dispense Refill  . ALPRAZolam (XANAX) 0.5 MG tablet TAKE ONE TABLET BY MOUTH TWICE DAILY AS NEEDED 60 tablet 0  . amitriptyline (ELAVIL) 25 MG tablet TAKE TWO TABLETS BY MOUTH AT BEDTIME 60 tablet 0  . baclofen (LIORESAL) 10 MG tablet TAKE ONE-HALF TABLET BY MOUTH THREE TIMES DAILY AS NEEDED 30 tablet 0  . benzoyl peroxide 5 % gel Apply topically daily. 90 g 3  . Dimethyl Fumarate (TECFIDERA) 240 MG CPDR Take 1 capsule by mouth 2 (two) times daily.    Marland Kitchen docusate sodium (COLACE) 100  MG capsule Take 1 capsule (100 mg total) by mouth 2 (two) times daily as needed (take to keep stool soft.). 60 capsule 0  . gabapentin (NEURONTIN) 300 MG capsule 1 cap 300 mg po at HS for 7 days  Then 2 bid for 7 days then  3 cap daily #120 with 2 refills 12 capsule 2  . naphazoline (NAPHCON) 0.1 % ophthalmic solution Place 1 drop into both eyes 4 (four) times daily as needed for irritation.    Marland Kitchen nystatin (MYCOSTATIN) 100000 UNIT/ML suspension Take 5 mLs (500,000 Units total) by mouth 4 (four) times daily. 240 mL 0  . TECFIDERA 240 MG CPDR     . valACYclovir (VALTREX) 1000 MG tablet 2 grams Q12 x2 doses prn cold sores 30 tablet 2  . Vitamin D, Ergocalciferol, (DRISDOL) 50000 UNITS CAPS capsule Take 1 capsule (50,000 Units total) by mouth every 7 (seven) days. 4 capsule 2  . Diazepam (VALIUM PO) Take by mouth.    . etodolac (LODINE) 400 MG tablet Take 400 mg by mouth 2 (two) times daily.    . fesoterodine (TOVIAZ) 8 MG TB24 tablet Take 1 tablet (8 mg total) by mouth daily. (Patient not taking: Reported on 04/01/2015) 30 tablet 5  . lidocaine (XYLOCAINE) 2 % solution Use as directed 20 mLs in the mouth or throat as needed for mouth pain. (Patient not taking: Reported on 04/01/2015) 100 mL 0  . mupirocin ointment  (BACTROBAN) 2 % Place 1 application into the nose 2 (two) times daily. (Patient not taking: Reported on 04/01/2015) 22 g 0  . ondansetron (ZOFRAN ODT) 4 MG disintegrating tablet Take 1 tablet (4 mg total) by mouth every 8 (eight) hours as needed for nausea or vomiting. (Patient not taking: Reported on 04/01/2015) 30 tablet 0  . SUMAtriptan (IMITREX) 100 MG tablet Take 100 mg by mouth every 2 (two) hours as needed for migraine. May repeat in 2 hours if headache persists or recurs.     No current facility-administered medications on file prior to visit.    ALLERGIES: Allergies  Allergen Reactions  . Codeine     Makes her "busy", itchy  . Meperidine Nausea And Vomiting  . Meperidine Hcl Nausea And Vomiting    SEVERE N/V  . Morphine Other (See Comments)    "SKIN CRAWLS"    FAMILY HISTORY: Family History  Problem Relation Age of Onset  . Hypertension Mother   . Diabetes Father   . Prostate cancer Father   . Lung cancer Father   . Cancer Father     cheek of mouth  . Diabetes Paternal Grandmother   . Diabetes Paternal Grandfather   . Diabetes Paternal Uncle     x 4  . Diabetes Paternal Aunt   . Colon cancer Maternal Uncle   . Esophageal cancer Neg Hx   . Rectal cancer Neg Hx   . Stomach cancer Neg Hx     SOCIAL HISTORY: History   Social History  . Marital Status: Single    Spouse Name: N/A  . Number of Children: 1  . Years of Education: N/A   Occupational History  . self-employed house cleaning    Social History Main Topics  . Smoking status: Former Smoker -- 0.50 packs/day for 20 years    Types: Cigarettes    Quit date: 01/17/2011  . Smokeless tobacco: Never Used  . Alcohol Use: No  . Drug Use: No  . Sexual Activity: No   Other Topics Concern  .  Not on file   Social History Narrative    REVIEW OF SYSTEMS: Constitutional: No fevers, chills, or sweats, no generalized fatigue, change in appetite Eyes: No visual changes, double vision, eye pain Ear, nose and  throat: No hearing loss, ear pain, nasal congestion, sore throat Cardiovascular: No chest pain, palpitations Respiratory:  No shortness of breath at rest or with exertion, wheezes GastrointestinaI: No nausea, vomiting, diarrhea, abdominal pain, fecal incontinence Genitourinary:  Neurogenic bladder Musculoskeletal:  Neck pain, back pain Integumentary: No rash, pruritus, skin lesions Neurological: as above Psychiatric: Depression, anxiety Endocrine: Fatigue Hematologic/Lymphatic:  No anemia, purpura, petechiae. Allergic/Immunologic: no itchy/runny eyes, nasal congestion, recent allergic reactions, rashes  PHYSICAL EXAM: Filed Vitals:   04/01/15 1523  BP: 102/80  Pulse: 110  Resp: 20   General: Moderate distress.  In pain.  Tearful.  Patient appears well-groomed.  thin body habitus. Head:  Normocephalic/atraumatic Eyes:  Fundoscopic exam unremarkable without vessel changes, exudates, hemorrhages or papilledema. Neck: supple, no paraspinal tenderness, full range of motion Heart:  Regular rate and rhythm Lungs:  Clear to auscultation bilaterally Back: No paraspinal tenderness Neurological Exam: alert and oriented to person, place, and time, recent and remote memory intact, fund of knowledge intact, attention and concentration intact, speech fluent and not dysarthric, language intact.  CN II-XII intact.  Fundi not able to be visualized.  Increased tone.  Muscle strength 5-/5 in deltoids, triceps and knee extension. 4+ in both hand grips.  Otherwise, 5/5 with mild give-away weakness.  Reduced pinprick sensation in the right forearm.  Vibration sensation intact.  Reduced vibration in the fingers and left foot. DTR 3+ throughout.  Finger to nose intact.  Mildly wide-based and antalgic gait.  Able to turn and walk in tandem.  Timed 25-Foot Walk 8 seconds.  Romberg with sway.  IMPRESSION: 1.  Probable relapsing-remitting MS.  She is having increased symptoms that seem more related to pain rather  than MS flare up.  The concerning issue for me is the dramatic drop in lymphocyte count since starting Tecfidera (way more than the expected 30% drop).  This may mean discontinuing the medication.  However, since she has a complex history and appears prone to symptomatic flare-ups, I would feel more comfortable that she be evaluated and treated by an MS specialist. 2.  Cervical spondylosis with myelopathy, status post ACDF with residual neuropathic pain 3.  Neurogenic bladder 4.  B12 deficiency 5.  Migraines without aura, controlled  PLAN: 1.  Will refer to Dr. Arlice Colt at [facility], who is an [place] specialist. 2.  In the meantime, will have her increase gabapentin to $RemoveBefor'600mg'[id number]$  three times daily.  She may also increase baclofen, as tolerated, to $RemoveBefor'10mg'RrHrwXApgsNa$  three times daily as needed. 3.  B12 injections 4.  D3 supplementation 5.  Amitriptyline for migraine prevention 6.  Follow up with urologist.  30 minutes spent face to face with patient, over 50% spent discussing management.  Stacy Clines, DO  CC:  Annye Asa, MD

## 2015-04-01 NOTE — Patient Instructions (Addendum)
1.  Increase the gabapentin to 2 capsules three times daily 2.  I would like to send you to Dr. Arlice Colt, who is an [place] specialist, to evaluate and continue care.  He has an appointment available on 7/22. 3.  Ask Dr. Felecia Shelling about Ampyra for walking difficulty 4.  Continue baclofen.  Instead of taking 1/2 tablet, take full tablet up to three times daily as needed for muscle spasms

## 2015-04-01 NOTE — Telephone Encounter (Signed)
Caller name: Manuela Schwartz with Dr. Georgie Chard office Can be reached: [phone]   Reason for call: Dr. Georgie Chard office has contacted [facility] for Dr. Dorthula Perfect, [place] specialist (Ph# [phone]). They have an appt on hold for 04/11/15 for the pt. They need referral prior to pt being able to come in for appt. Please enter referral and make sure pt is contacted once sent to Dr. Dalbert Mayotte office so she can call for the appt to be taken off hold.

## 2015-04-02 ENCOUNTER — Encounter: Payer: Self-pay | Admitting: Family Medicine

## 2015-04-02 ENCOUNTER — Ambulatory Visit (INDEPENDENT_AMBULATORY_CARE_PROVIDER_SITE_OTHER): Payer: 59 | Admitting: *Deleted

## 2015-04-02 DIAGNOSIS — E538 Deficiency of other specified B group vitamins: Secondary | ICD-10-CM | POA: Diagnosis not present

## 2015-04-02 LAB — HM PAP SMEAR: HM PAP: NORMAL

## 2015-04-02 LAB — HM DEXA SCAN

## 2015-04-02 MED ORDER — CYANOCOBALAMIN 1000 MCG/ML IJ SOLN
1000.0000 ug | Freq: Once | INTRAMUSCULAR | Status: AC
  Administered 2015-04-02: 1000 ug via INTRAMUSCULAR

## 2015-04-02 NOTE — Progress Notes (Signed)
Pre visit review using our clinic review tool, if applicable. No additional management support is needed unless otherwise documented below in the visit note.  Notes Recorded by Midge Minium, MD on 03/28/2015 at 1:04 PM B12 deficiency- based on this, pt needs to start B12 injxns weekly x4 weeks and then monthly  Patient tolerated injection well.  Next scheduled injection 04/08/15.

## 2015-04-02 NOTE — Telephone Encounter (Signed)
This referral was placed today. please notify pt when approved.

## 2015-04-03 ENCOUNTER — Other Ambulatory Visit: Payer: Self-pay | Admitting: Obstetrics and Gynecology

## 2015-04-03 DIAGNOSIS — E041 Nontoxic single thyroid nodule: Secondary | ICD-10-CM

## 2015-04-04 ENCOUNTER — Ambulatory Visit
Admission: RE | Admit: 2015-04-04 | Discharge: 2015-04-04 | Disposition: A | Discharge status: Home / Self Care | Payer: 59 | Source: Ambulatory Visit | Attending: Obstetrics and Gynecology | Admitting: Obstetrics and Gynecology

## 2015-04-04 DIAGNOSIS — E041 Nontoxic single thyroid nodule: Secondary | ICD-10-CM

## 2015-04-04 NOTE — Telephone Encounter (Signed)
Insurance auth # [id number] aware

## 2015-04-06 NOTE — Assessment & Plan Note (Signed)
Ongoing issue for pt.  Following w/ Dr Tomi Likens, whom she likes.  Referral entered.

## 2015-04-06 NOTE — Assessment & Plan Note (Signed)
Ongoing issue for pt.  Encouraged her to discuss her difficulties w/ the office so that MD is aware and has opportunity to correct it.  Referral entered so pt doesn't encounter insurance difficulties.

## 2015-04-06 NOTE — Assessment & Plan Note (Signed)
Ongoing issue.  Pt has referral to urology but states she has been having difficulty w/ insurance coverage for visits.  New referral entered.

## 2015-04-06 NOTE — Assessment & Plan Note (Signed)
Ongoing issue for pt.  Recommended that she speak w/ both neuro and neurosurgery regarding her sxs so that she knows whether it is her MS vs her neck issues.  Told her that the EMG/nerve conduction study would not be my decision.  She understands this and will ask her other MDs.

## 2015-04-06 NOTE — Assessment & Plan Note (Signed)
Ongoing issue for pt.  Asking for referral to [facility] for complete evaluation.  Referral placed.

## 2015-04-06 NOTE — Assessment & Plan Note (Signed)
Ongoing issue for pt.  Is in counseling.  If no improvement in mood, will need to address daily medication for depression/anxiety.  Will follow closely.

## 2015-04-07 ENCOUNTER — Other Ambulatory Visit (HOSPITAL_COMMUNITY): Payer: Self-pay | Admitting: Obstetrics and Gynecology

## 2015-04-07 DIAGNOSIS — E041 Nontoxic single thyroid nodule: Secondary | ICD-10-CM

## 2015-04-08 ENCOUNTER — Ambulatory Visit (INDEPENDENT_AMBULATORY_CARE_PROVIDER_SITE_OTHER): Payer: 59 | Admitting: *Deleted

## 2015-04-08 DIAGNOSIS — E538 Deficiency of other specified B group vitamins: Secondary | ICD-10-CM | POA: Diagnosis not present

## 2015-04-08 MED ORDER — CYANOCOBALAMIN 1000 MCG/ML IJ SOLN
1000.0000 ug | Freq: Once | INTRAMUSCULAR | Status: AC
  Administered 2015-04-08: 1000 ug via INTRAMUSCULAR

## 2015-04-08 NOTE — Progress Notes (Signed)
Pre visit review using our clinic review tool, if applicable. No additional management support is needed unless otherwise documented below in the visit note.    Notes Recorded by Midge Minium, MD on 03/28/2015 at 1:04 PM B12 deficiency- based on this, pt needs to start B12 injxns weekly x4 weeks and then monthly   Patient tolerated injection well.  Patient scheduled next injection for 04/06/15.

## 2015-04-14 ENCOUNTER — Ambulatory Visit (HOSPITAL_COMMUNITY)
Admission: RE | Admit: 2015-04-14 | Discharge: 2015-04-14 | Disposition: A | Discharge status: Home / Self Care | Payer: 59 | Source: Ambulatory Visit | Attending: Obstetrics and Gynecology | Admitting: Obstetrics and Gynecology

## 2015-04-14 ENCOUNTER — Other Ambulatory Visit: Payer: Self-pay | Admitting: Obstetrics and Gynecology

## 2015-04-14 DIAGNOSIS — E010 Iodine-deficiency related diffuse (endemic) goiter: Secondary | ICD-10-CM | POA: Diagnosis not present

## 2015-04-14 DIAGNOSIS — E041 Nontoxic single thyroid nodule: Secondary | ICD-10-CM | POA: Diagnosis not present

## 2015-04-14 DIAGNOSIS — E042 Nontoxic multinodular goiter: Secondary | ICD-10-CM | POA: Diagnosis present

## 2015-04-15 ENCOUNTER — Encounter: Payer: Self-pay | Admitting: Neurology

## 2015-04-15 ENCOUNTER — Ambulatory Visit (INDEPENDENT_AMBULATORY_CARE_PROVIDER_SITE_OTHER): Payer: 59 | Admitting: Neurology

## 2015-04-15 VITALS — BP 116/74 | HR 78 | Resp 14 | Ht 69.0 in | Wt 123.4 lb

## 2015-04-15 DIAGNOSIS — R5383 Other fatigue: Secondary | ICD-10-CM

## 2015-04-15 DIAGNOSIS — R29898 Other symptoms and signs involving the musculoskeletal system: Secondary | ICD-10-CM | POA: Diagnosis not present

## 2015-04-15 DIAGNOSIS — M4722 Other spondylosis with radiculopathy, cervical region: Secondary | ICD-10-CM

## 2015-04-15 DIAGNOSIS — R208 Other disturbances of skin sensation: Secondary | ICD-10-CM | POA: Insufficient documentation

## 2015-04-15 DIAGNOSIS — G35 Multiple sclerosis: Secondary | ICD-10-CM | POA: Diagnosis not present

## 2015-04-15 DIAGNOSIS — N3941 Urge incontinence: Secondary | ICD-10-CM | POA: Diagnosis not present

## 2015-04-15 DIAGNOSIS — R413 Other amnesia: Secondary | ICD-10-CM | POA: Insufficient documentation

## 2015-04-15 DIAGNOSIS — M4712 Other spondylosis with myelopathy, cervical region: Secondary | ICD-10-CM | POA: Diagnosis not present

## 2015-04-15 DIAGNOSIS — G4700 Insomnia, unspecified: Secondary | ICD-10-CM

## 2015-04-15 DIAGNOSIS — F418 Other specified anxiety disorders: Secondary | ICD-10-CM | POA: Diagnosis not present

## 2015-04-15 MED ORDER — LAMOTRIGINE 100 MG PO TABS: 100.0000 mg | ORAL_TABLET | Freq: Every day | ORAL | Status: DC

## 2015-04-15 MED ORDER — AMPHETAMINE-DEXTROAMPHET ER 15 MG PO CP24: 15.0000 mg | ORAL_CAPSULE | ORAL | Status: DC

## 2015-04-15 MED ORDER — LAMOTRIGINE 25 MG PO TABS: ORAL_TABLET | ORAL | Status: DC

## 2015-04-15 MED ORDER — BACLOFEN 10 MG PO TABS: 10.0000 mg | ORAL_TABLET | Freq: Two times a day (BID) | ORAL | Status: DC | PRN

## 2015-04-15 NOTE — Progress Notes (Signed)
[facility]  PATIENT: Stacy Logan DOB: April 27, 1966  REFERRING DOCTOR OR PCP:  Annye Asa SOURCE: patient, EMR records from [facility].  MRI images on PACS  _________________________________   HISTORICAL  CHIEF COMPLAINT:  Chief Complaint  Patient presents with  . Multiple Sclerosis    Stacy Logan is here with her sister [name] for eval of MS.  Sts. she was dx. in 2001.  Presenting sx. was decreased hearing left ear.  Sts. she was eval by ENT and then referred to neurology.  Sts. she saw a neurologist here in [place] (can't remember who she saw), and that mri brain confirmed dx. of MS.  She did not have an lp.  Sts. neuro told her she would need mri's every 3-4 yrs. Sts. she was not put on any med at the time. Sts. she was stable until about 2011--she was having difficulty with bladder-  . Numbness    urinary frequency.  She saw a urologist--Dr. Quin Hoop sts. he told her bladder problems were secondary to MS.  Still, she remained lost to follow up regaring neuro/MS, until the fall of 2015, when she saw Dr. Tomi Likens.  She sts. mri brain and c-spine at that time showed new lesions in both brain and neck, and in November 2015 she started Tecfidera, which she sts. she has tolerated well.  Sts. she started noticing more difficulty with speech--slurring and stuttering, in Feb. 2016.  She c/o numbness in   . Fatigue    both lower arms, all fingers.  Sts. numbness became worse after cervical surgery in 2015 (Dr. Arnoldo Morale at [facility]).  Sts. she has difficulty with balance, more trouble with left leg. She also c/o fatigue, worse in the heat./fim    HISTORY OF PRESENT ILLNESS:  I had the pleasure seeing you patient, Stacy Logan, at [facility] for neurologic consultation regarding her multiple sclerosis.  In 2001, she was noted to have left hearing loss and had an MRI of the brain.   She did not have numbness or weakness a that time.  She was told she  likely had MS.   She saw a neurologist.  She never had an LP.   She reports she was told she had MS but was never started on any medication.   Starting about 5-6 years ago, she was noting more difficulty with gait and also noted some memory issues.   She was also having bladder issues with urgency and bladder incontinence x 5 years.      However, she had no health insurance at that time and did not follow up with neurology.   She saw Dr. Tomi Likens last year.   He ordered an MRI of the brian and cervical spine.   She had one enhancing lesion on the spine and one additional lesion.  An LP was ordered but she opted not to do the test.   She was started on tecfidera.   She has had some itching and flushing, especially if she does not take after food.     She also was found to have severe spinal stenosis and myelopathy.   She was sent to Dr. Arnoldo Morale of Neurosurgery.   She underwent  ACDF from C3-C7 06/2014.    She reports she is having more trouble with her arms since the surgery.   The worse pain is in the 2nd and 3rd fingers but she has altered sensation with some pain in the other fingers as well.   She  also has right > left lower neck pain.  Bladder is about the same (maybe slightly better) as it was before surgery.      Gait/strength/sensation:  She notes left > right leg weakness and her gait is wide with a left foot drop at times.   Legs seem very heavy to her.   Gait is worse when the temperatures are hot.   She notes some left leg numbness.   She notes weakness in both arms.    She is now taking 900 mg po gabapentin tid.     She has a lot of pain and takes percocet 10 mg every morning and sometimes later in the day.   If she takes at night., she gets insomnia  Bladder:   She continues to have urinary frequency and incontinence.   She sees Dr. Kary Kos.   She takes intravaginal valium if she gets spasms there.    Vision/eyes:   She notes a cataract that needs removal but has not had definite MS related issue.    Peripheral vision is reduced.   She has had a right eye twitching x 6 months.   She does worse in bright lights and when she is tired at night.    Fatigue/sleep:  She notes much more fatigue the past year.   She feels this is physical and mental.    She sleeps poorly at night.   She gets 'worm sensation' in her legs and she has trouble getting comfortable at night.    She gets cramps in her outer lower legs at night.   She has insomnia.   Amitriptyline, gabapentin are taken at bedtime without  Mood/cognition:   She has depression and anxiety.   She takes Effexor 37.5.  With some benefit.   She notes a lot of difficulty with cognition and attention.  Specifically, organizing is difficult.   She is very forgetful and distractible.     I personally reviewed the MRI of the brain dated 01/12/2015 and the MRIs of the cervical spine dated 02/01/2015 and 05/22/2014.   The MRI of the brain shows white matter foci predominantly in the deep white matter but also in the periventricular and subcortical white matter of both hemispheres. The brainstem appears normal. There is no significant atrophy. The MRI of the cervical spine from 2015 showed severe spinal stenosis at C5-C6 and milder spinal stenosis at C6-C7 and C4-C5. There is an enhancing focus within the spinal cord adjacent to C5-C6 and she has another hyperintense focus at C3-C4. On the second MRI, done without contrast, she is status post C3-C7 ACDF    REVIEW OF SYSTEMS: Constitutional: No fevers, chills, sweats, or change in appetite Eyes: No visual changes, double vision, eye pain Ear, nose and throat: No hearing loss, ear pain, nasal congestion, sore throat Cardiovascular: No chest pain, palpitations Respiratory: No shortness of breath at rest or with exertion.   No wheezes GastrointestinaI: No nausea, vomiting, diarrhea, abdominal pain, fecal incontinence Genitourinary: No dysuria, urinary retention or frequency.  No nocturia. Musculoskeletal: No  neck pain, back pain Integumentary: No rash, pruritus, skin lesions Neurological: as above Psychiatric: No depression at this time.  No anxiety Endocrine: No palpitations, diaphoresis, change in appetite, change in weigh or increased thirst Hematologic/Lymphatic: No anemia, purpura, petechiae. Allergic/Immunologic: No itchy/runny eyes, nasal congestion, recent allergic reactions, rashes  ALLERGIES: Allergies  Allergen Reactions  . Codeine     Makes her "busy", itchy  . Meperidine Nausea And Vomiting  . Meperidine  Hcl Nausea And Vomiting    SEVERE N/V  . Morphine Other (See Comments)    "SKIN CRAWLS"    HOME MEDICATIONS:  Current outpatient prescriptions:  .  ALPRAZolam (XANAX) 0.5 MG tablet, TAKE ONE TABLET BY MOUTH TWICE DAILY AS NEEDED, Disp: 60 tablet, Rfl: 0 .  amitriptyline (ELAVIL) 25 MG tablet, TAKE TWO TABLETS BY MOUTH AT BEDTIME, Disp: 60 tablet, Rfl: 0 .  baclofen (LIORESAL) 10 MG tablet, TAKE ONE-HALF TABLET BY MOUTH THREE TIMES DAILY AS NEEDED, Disp: 30 tablet, Rfl: 0 .  Cholecalciferol (VITAMIN D3) 50000 UNITS CAPS, , Disp: , Rfl:  .  Diazepam (VALIUM PO), Take by mouth., Disp: , Rfl:  .  Dimethyl Fumarate (TECFIDERA) 240 MG CPDR, Take 1 capsule by mouth 2 (two) times daily., Disp: , Rfl:  .  naphazoline (NAPHCON) 0.1 % ophthalmic solution, Place 1 drop into both eyes 4 (four) times daily as needed for irritation., Disp: , Rfl:  .  SUMAtriptan (IMITREX) 100 MG tablet, Take 100 mg by mouth every 2 (two) hours as needed for migraine. May repeat in 2 hours if headache persists or recurs., Disp: , Rfl:  .  valACYclovir (VALTREX) 1000 MG tablet, 2 grams Q12 x2 doses prn cold sores, Disp: 30 tablet, Rfl: 2 .  Vitamin D, Ergocalciferol, (DRISDOL) 50000 UNITS CAPS capsule, Take 1 capsule (50,000 Units total) by mouth every 7 (seven) days., Disp: 4 capsule, Rfl: 2 .  benzoyl peroxide 5 % gel, Apply topically daily. (Patient not taking: Reported on 04/15/2015), Disp: 90 g, Rfl:  3 .  docusate sodium (COLACE) 100 MG capsule, Take 1 capsule (100 mg total) by mouth 2 (two) times daily as needed (take to keep stool soft.). (Patient not taking: Reported on 04/15/2015), Disp: 60 capsule, Rfl: 0 .  etodolac (LODINE) 400 MG tablet, Take 400 mg by mouth 2 (two) times daily., Disp: , Rfl:  .  fesoterodine (TOVIAZ) 8 MG TB24 tablet, Take 1 tablet (8 mg total) by mouth daily. (Patient not taking: Reported on 04/01/2015), Disp: 30 tablet, Rfl: 5 .  gabapentin (NEURONTIN) 300 MG capsule, 1 cap 300 mg po at HS for 7 days  Then 2 bid for 7 days then  3 cap daily #120 with 2 refills, Disp: 12 capsule, Rfl: 2 .  lidocaine (XYLOCAINE) 2 % solution, Use as directed 20 mLs in the mouth or throat as needed for mouth pain. (Patient not taking: Reported on 04/01/2015), Disp: 100 mL, Rfl: 0 .  mupirocin ointment (BACTROBAN) 2 %, Place 1 application into the nose 2 (two) times daily. (Patient not taking: Reported on 04/01/2015), Disp: 22 g, Rfl: 0 .  nystatin (MYCOSTATIN) 100000 UNIT/ML suspension, Take 5 mLs (500,000 Units total) by mouth 4 (four) times daily. (Patient not taking: Reported on 04/15/2015), Disp: 240 mL, Rfl: 0 .  ondansetron (ZOFRAN ODT) 4 MG disintegrating tablet, Take 1 tablet (4 mg total) by mouth every 8 (eight) hours as needed for nausea or vomiting. (Patient not taking: Reported on 04/01/2015), Disp: 30 tablet, Rfl: 0 .  venlafaxine XR (EFFEXOR-XR) 37.5 MG 24 hr capsule, , Disp: , Rfl:   PAST MEDICAL HISTORY: Past Medical History  Diagnosis Date  . Arthritis   . Depression   . Migraines   . History of pneumothorax     1988-- SPONTANEOUS--  RESOLVED W/ CHEST TUBE  . Lesion of bladder   . H/O cold sores   . Borderline diabetes   . Frequency of urination   . Urgency of  urination   . Nocturia   . Seasonal allergies   . Anal fissure   . Anxiety   . IC (interstitial cystitis)   . Pre-eclampsia   . MS (multiple sclerosis)     MRI showed plague on her brain    PAST SURGICAL  HISTORY: Past Surgical History  Procedure Laterality Date  . Tonsillectomy and adenoidectomy  1972  . D & c hysteroscopy with polypectomy  12-02-2003  . Cesarean section  1994  . Dx laparoscopy/  fulgeration endometriosis/  appendectomy  1985  . Cystoscopy with hydrodistension and biopsy Bilateral 02/15/2014    Procedure: CYSTOSCOPY  BILATERAL RETROGRADE PYLOGRAM, HYDRODISTENSION, INSTILATION OF MARCAINE AND PYRIDIUM;  Surgeon: Ardis Hughs, MD;  Location: [facility];  Service: Urology;  Laterality: Bilateral;  . Appendectomy    . Dilation and curettage of uterus    . Anterior cervical decompression/discectomy fusion 4 levels N/A 07/03/2014    Procedure: ANTERIOR CERVICAL DECOMPRESSION/DISCECTOMY FUSION 4 LEVELS;  Surgeon: Newman Pies, MD;  Location: [facility];  Service: Neurosurgery;  Laterality: N/A;  C3-4 C4-5 C5-6 C6-7 Anterior cervical decompression/diskectomy/fusion/interbody prosthesis/plate    FAMILY HISTORY: Family History  Problem Relation Age of Onset  . Hypertension Mother   . Diabetes Father   . Prostate cancer Father   . Lung cancer Father   . Cancer Father     cheek of mouth  . Diabetes Paternal Grandmother   . Diabetes Paternal Grandfather   . Diabetes Paternal Uncle     x 4  . Diabetes Paternal Aunt   . Colon cancer Maternal Uncle   . Esophageal cancer Neg Hx   . Rectal cancer Neg Hx   . Stomach cancer Neg Hx     SOCIAL HISTORY:  History   Social History  . Marital Status: Single    Spouse Name: N/A  . Number of Children: 1  . Years of Education: N/A   Occupational History  . self-employed house cleaning    Social History Main Topics  . Smoking status: Former Smoker -- 0.50 packs/day for 20 years    Types: Cigarettes    Quit date: 01/17/2011  . Smokeless tobacco: Never Used  . Alcohol Use: No  . Drug Use: No  . Sexual Activity: No   Other Topics Concern  . Not on file   Social History Narrative     PHYSICAL  EXAM  Filed Vitals:   04/15/15 1334  BP: 116/74  Pulse: 78  Resp: 14  Height: 5\' 9"  (1.753 m)  Weight: 123 lb 6.4 oz (55.974 kg)    Body mass index is 18.21 kg/(m^2).   General: The patient is well-developed and well-nourished and in no acute distress  Eyes:  Funduscopic exam shows normal optic discs and retinal vessels.  Neck: The neck is supple, no carotid bruits are noted.  The neck is nontender.  Cardiovascular: The heart has a regular rate and rhythm with a normal S1 and S2. There were no murmurs, gallops or rubs. Lungs are clear to auscultation.  Skin: Extremities are without significant edema.  Musculoskeletal:  Back is nontender  Neurologic Exam  Mental status: The patient is alert and oriented x 3 at the time of the examination. The patient has apparent normal recent and remote memory, with an apparently normal attention span and concentration ability.   Speech is normal.  Cranial nerves: Extraocular movements are full. Pupils are equal, round, and reactive to light and accomodation.  Visual fields are full.  Facial symmetry is present. There is good facial sensation to soft touch bilaterally.Facial strength is normal.  Trapezius and sternocleidomastoid strength is normal. No dysarthria is noted.  The tongue is midline, and the patient has symmetric elevation of the soft palate. No obvious hearing deficits are noted.  Motor:  Muscle bulk is normal.   Tone is normal. Strength is  4+ / 5 in the triceps and 5- to 5/5 elsewhere in arms and 5/5 in legs.   Sensory: Sensory testing shows decreased sensation to touch and vibration in left C6 and C7 dermatomes of hand.  Also mild decreased vibration in left leg, relative to right  Coordination: Cerebellar testing reveals good finger-nose-finger and ok heel-to-shin bilaterally (slower on right but accurate).  Gait and station: Station is normal.   Gait is normal. Tandem gait is wide. Romberg is negative.   Reflexes: Deep tendon  reflexes are increased bilaterally with spread at the knees but no clonus.   Plantar responses are flexor.    DIAGNOSTIC DATA (LABS, IMAGING, TESTING) - I reviewed patient records, labs, notes, testing and imaging myself where available.  Lab Results  Component Value Date   WBC 7.7 03/28/2015   HGB 11.1* 03/28/2015   HCT 33.9* 03/28/2015   MCV 83.1 03/28/2015   PLT 203.0 03/28/2015      Component Value Date/Time   NA 141 03/28/2015 0852   K 4.1 03/28/2015 0852   CL 105 03/28/2015 0852   CO2 31 03/28/2015 0852   GLUCOSE 103* 03/28/2015 0852   BUN 20 03/28/2015 0852   CREATININE 0.61 03/28/2015 0852   CREATININE 0.55 12/26/2014 1042   CALCIUM 9.6 03/28/2015 0852   PROT 7.1 03/28/2015 0852   ALBUMIN 4.1 03/28/2015 0852   AST 23 03/28/2015 0852   ALT 17 03/28/2015 0852   ALKPHOS 91 03/28/2015 0852   BILITOT 0.3 03/28/2015 0852   GFRNONAA >89 12/26/2014 1042   GFRNONAA >90 06/27/2014 1600   GFRAA >89 12/26/2014 1042   GFRAA >90 06/27/2014 1600   Lab Results  Component Value Date   CHOL 169 01/16/2014   HDL 54.10 01/16/2014   LDLCALC 98 01/16/2014   TRIG 87.0 01/16/2014   CHOLHDL 3 01/16/2014   Lab Results  Component Value Date   HGBA1C 5.8 04/23/2014   Lab Results  Component Value Date   VITAMINB12 154* 03/28/2015   Lab Results  Component Value Date   TSH 0.99 03/28/2015       ASSESSMENT AND PLAN  MS (multiple sclerosis)  Cervical spondylosis with myelopathy and radiculopathy  Depression with anxiety  Bilateral arm weakness  Dysesthesia  Other fatigue  Urge incontinence  Insomnia  Memory disturbance   In summary, Stacy Logan is a 49 year old woman with the diagnosis of multiple sclerosis who also had severe spinal stenosis with myelopathy. Many of the white matter foci are nonspecific and could be seen with small vessel ischemic changes or demyelination. Compressive myelopathy should not lead to enhancement of the spinal cord and the  enhancement makes MS more probable. She is doing well on Tecfidera and we will continue that medication. A recent CBC with differential shows a lymphocyte count of 0.6. If she drops at or below 0.5, we will need to reconsider a different medication.  She does not seem to be getting much benefit from gabapentin as far as the dysesthesias. Therefore, I will add lamotrigine and she can taper the gabapentin or if she gets a benefit. To help her insomnia, she may wish to  take a baclofen every night. I'll stop the amitriptyline as it is not helping her. She also has a lot of fatigue and some attentional disturbance have her try Adderall XR 15 mg daily.  She will return to see me in 2-3 months or sooner if she has new or worsening neurologic symptoms.       Chinara Hertzberg A. Felecia Shelling, MD, PhD 3/73/4287, 6:81 PM Certified in Neurology, Clinical Neurophysiology, Sleep Medicine, Pain Medicine and Neuroimaging  [facility] [address] [place], [place] [address] [phone]

## 2015-04-15 NOTE — Patient Instructions (Signed)
Lamotrigine:   Take one 25 mg  daily x 1 week, then one pill twice daily x 1 week, then one pill three times daily x 1 week, then 2 pills twice daily x 1 week, then start other 100 mg twice daily  Adderall XR in the morning

## 2015-04-16 ENCOUNTER — Ambulatory Visit: Payer: 59

## 2015-04-18 ENCOUNTER — Ambulatory Visit (INDEPENDENT_AMBULATORY_CARE_PROVIDER_SITE_OTHER): Payer: 59 | Admitting: *Deleted

## 2015-04-18 DIAGNOSIS — E538 Deficiency of other specified B group vitamins: Secondary | ICD-10-CM

## 2015-04-18 MED ORDER — CYANOCOBALAMIN 1000 MCG/ML IJ SOLN
1000.0000 ug | Freq: Once | INTRAMUSCULAR | Status: AC
  Administered 2015-04-18: 1000 ug via INTRAMUSCULAR

## 2015-04-18 NOTE — Progress Notes (Signed)
Pre visit review using our clinic review tool, if applicable. No additional management support is needed unless otherwise documented below in the visit note.  Patient tolerated injection well.  

## 2015-04-23 ENCOUNTER — Other Ambulatory Visit: Payer: Self-pay | Admitting: Obstetrics and Gynecology

## 2015-04-23 DIAGNOSIS — R928 Other abnormal and inconclusive findings on diagnostic imaging of breast: Secondary | ICD-10-CM

## 2015-04-28 ENCOUNTER — Telehealth: Payer: Self-pay | Admitting: Family Medicine

## 2015-04-28 ENCOUNTER — Other Ambulatory Visit: Payer: Self-pay | Admitting: Family Medicine

## 2015-04-28 ENCOUNTER — Ambulatory Visit
Admission: RE | Admit: 2015-04-28 | Discharge: 2015-04-28 | Disposition: A | Discharge status: Home / Self Care | Payer: 59 | Source: Ambulatory Visit | Attending: Obstetrics and Gynecology | Admitting: Obstetrics and Gynecology

## 2015-04-28 DIAGNOSIS — R928 Other abnormal and inconclusive findings on diagnostic imaging of breast: Secondary | ICD-10-CM

## 2015-04-28 DIAGNOSIS — E041 Nontoxic single thyroid nodule: Secondary | ICD-10-CM

## 2015-04-28 NOTE — Telephone Encounter (Signed)
Pt said that Dr. Eual Fines OBGYN saw her with a nodule on her thyroid. He wants a stat ultrasound done on her thyroid to determine if it needs to be aspirated. Please enter referral due to insurance. Call pt with additional questions.

## 2015-04-28 NOTE — Telephone Encounter (Signed)
Ok to place imaging, or do you need to see pt first?

## 2015-04-28 NOTE — Telephone Encounter (Signed)
[place] ordered. Please contact pt to schedule.

## 2015-04-28 NOTE — Telephone Encounter (Signed)
Ok for thyroid US due to thyroid nodule

## 2015-04-29 NOTE — Telephone Encounter (Signed)
Last OV 03/28/15 Alprazolam last filled 03/27/15 #60 with 0

## 2015-04-29 NOTE — Telephone Encounter (Signed)
Medication filled to pharmacy as requested.   

## 2015-05-01 ENCOUNTER — Other Ambulatory Visit: Payer: Self-pay | Admitting: Family Medicine

## 2015-05-01 ENCOUNTER — Ambulatory Visit (HOSPITAL_BASED_OUTPATIENT_CLINIC_OR_DEPARTMENT_OTHER)
Admission: RE | Admit: 2015-05-01 | Discharge: 2015-05-01 | Disposition: A | Discharge status: Home / Self Care | Payer: 59 | Source: Ambulatory Visit | Attending: Family Medicine | Admitting: Family Medicine

## 2015-05-01 DIAGNOSIS — E042 Nontoxic multinodular goiter: Secondary | ICD-10-CM | POA: Diagnosis not present

## 2015-05-01 DIAGNOSIS — E041 Nontoxic single thyroid nodule: Secondary | ICD-10-CM

## 2015-05-12 ENCOUNTER — Telehealth: Payer: Self-pay | Admitting: Neurology

## 2015-05-12 NOTE — Telephone Encounter (Signed)
Patient is calling as she was to be in a trial drug program for her MS and states she has not received any direction in regard to that.  Could you please call. Thanks!

## 2015-05-12 NOTE — Telephone Encounter (Signed)
I have spoken with Narda and have advised that [facility] is looking into her eligibility for MS trial.  She verbalized understanding of same/fim

## 2015-05-15 ENCOUNTER — Ambulatory Visit (INDEPENDENT_AMBULATORY_CARE_PROVIDER_SITE_OTHER): Payer: 59 | Admitting: Family Medicine

## 2015-05-15 ENCOUNTER — Other Ambulatory Visit (INDEPENDENT_AMBULATORY_CARE_PROVIDER_SITE_OTHER): Payer: 59

## 2015-05-15 ENCOUNTER — Encounter: Payer: Self-pay | Admitting: Family Medicine

## 2015-05-15 VITALS — BP 110/78 | HR 85 | Temp 97.9°F | Resp 16 | Wt 123.5 lb

## 2015-05-15 DIAGNOSIS — R7303 Prediabetes: Secondary | ICD-10-CM

## 2015-05-15 DIAGNOSIS — R7309 Other abnormal glucose: Secondary | ICD-10-CM | POA: Diagnosis not present

## 2015-05-15 DIAGNOSIS — Z0000 Encounter for general adult medical examination without abnormal findings: Secondary | ICD-10-CM | POA: Insufficient documentation

## 2015-05-15 LAB — CBC WITH DIFFERENTIAL/PLATELET
BASOS PCT: 0.2 % (ref 0.0–3.0)
Basophils Absolute: 0 10*3/uL (ref 0.0–0.1)
Eosinophils Absolute: 0.1 10*3/uL (ref 0.0–0.7)
Eosinophils Relative: 1.2 % (ref 0.0–5.0)
HEMATOCRIT: 33.5 % — AB (ref 36.0–46.0)
Hemoglobin: 10.9 g/dL — ABNORMAL LOW (ref 12.0–15.0)
LYMPHS PCT: 9 % — AB (ref 12.0–46.0)
Lymphs Abs: 0.6 10*3/uL — ABNORMAL LOW (ref 0.7–4.0)
MCHC: 32.5 g/dL (ref 30.0–36.0)
MCV: 83.2 fl (ref 78.0–100.0)
MONOS PCT: 5.6 % (ref 3.0–12.0)
Monocytes Absolute: 0.4 10*3/uL (ref 0.1–1.0)
NEUTROS ABS: 5.8 10*3/uL (ref 1.4–7.7)
Neutrophils Relative %: 84 % — ABNORMAL HIGH (ref 43.0–77.0)
Platelets: 219 10*3/uL (ref 150.0–400.0)
RBC: 4.03 Mil/uL (ref 3.87–5.11)
RDW: 17.9 % — AB (ref 11.5–15.5)
WBC: 6.9 10*3/uL (ref 4.0–10.5)

## 2015-05-15 LAB — HEPATIC FUNCTION PANEL
ALT: 10 U/L (ref 0–35)
AST: 12 U/L (ref 0–37)
Albumin: 4.2 g/dL (ref 3.5–5.2)
Alkaline Phosphatase: 66 U/L (ref 39–117)
Bilirubin, Direct: 0.1 mg/dL (ref 0.0–0.3)
Total Bilirubin: 0.4 mg/dL (ref 0.2–1.2)
Total Protein: 7 g/dL (ref 6.0–8.3)

## 2015-05-15 LAB — LIPID PANEL
CHOL/HDL RATIO: 3
Cholesterol: 161 mg/dL (ref 0–200)
HDL: 62.6 mg/dL (ref >39.00)
LDL Cholesterol: 86 mg/dL (ref 0–99)
NONHDL: 98.16
Triglycerides: 61 mg/dL (ref 0.0–149.0)
VLDL: 12.2 mg/dL (ref 0.0–40.0)

## 2015-05-15 LAB — BASIC METABOLIC PANEL
BUN: 15 mg/dL (ref 6–23)
CALCIUM: 9.2 mg/dL (ref 8.4–10.5)
CHLORIDE: 103 meq/L (ref 96–112)
CO2: 30 mEq/L (ref 19–32)
Creatinine, Ser: 0.63 mg/dL (ref 0.40–1.20)
GFR: 105.72 mL/min (ref >60.00)
Glucose, Bld: 114 mg/dL — ABNORMAL HIGH (ref 70–99)
Potassium: 3.9 mEq/L (ref 3.5–5.1)
Sodium: 139 mEq/L (ref 135–145)

## 2015-05-15 LAB — HEMOGLOBIN A1C: Hgb A1c MFr Bld: 5.9 % (ref 4.6–6.5)

## 2015-05-15 LAB — TSH: TSH: 0.87 u[IU]/mL (ref 0.35–4.50)

## 2015-05-15 NOTE — Assessment & Plan Note (Signed)
Pt's PE WNL w/ exception of known thyroid nodule.  UTD on GYN, colonoscopy.  Check labs.  Anticipatory guidance provided.

## 2015-05-15 NOTE — Progress Notes (Signed)
   Subjective:    Patient ID: Stacy Logan, female    DOB: [birth year], 49 y.o.   MRN: [id number]  HPI CPE- UTD on GYN ([facility]), colonoscopy.   Review of Systems Patient reports no vision/ hearing changes, adenopathy,fever, weight change,  persistant/recurrent hoarseness , swallowing issues, chest pain, palpitations, edema, persistant/recurrent cough, hemoptysis, dyspnea (rest/exertional/paroxysmal nocturnal), gastrointestinal bleeding (melena, rectal bleeding), abdominal pain, significant heartburn, bowel changes, GU symptoms (dysuria, hematuria, incontinence), Gyn symptoms (abnormal  bleeding, pain),  syncope, memory loss, skin/hair/nail changes, abnormal bruising or bleeding, anxiety, or depression.   + numbness/tingling of hands/feet    Objective:   Physical Exam General Appearance:    Alert, cooperative, no distress, appears stated age  Head:    Normocephalic, without obvious abnormality, atraumatic  Eyes:    PERRL, conjunctiva/corneas clear, EOM's intact, fundi    benign, both eyes  Ears:    Normal TM's and external ear canals, both ears  Nose:   Nares normal, septum midline, mucosa normal, no drainage    or sinus tenderness  Throat:   Lips, mucosa, and tongue normal; teeth and gums normal  Neck:   Supple, symmetrical, trachea midline, no adenopathy;    Thyroid: enlargement w/ R thyroid nodule  Back:     Symmetric, no curvature, ROM normal, no CVA tenderness  Lungs:     Clear to auscultation bilaterally, respirations unlabored  Chest Wall:    No tenderness or deformity   Heart:    Regular rate and rhythm, S1 and S2 normal, no murmur, rub   or gallop  Breast Exam:    Deferred to GYN  Abdomen:     Soft, non-tender, bowel sounds active all four quadrants,    no masses, no organomegaly  Genitalia:    Deferred to GYN  Rectal:    Extremities:   Extremities normal, atraumatic, no cyanosis or edema  Pulses:   2+ and symmetric all extremities  Skin:   Skin color, texture, turgor  normal, no rashes or lesions  Lymph nodes:   Cervical, supraclavicular, and axillary nodes normal  Neurologic:   CNII-XII intact, normal strength, sensation and reflexes    throughout          Assessment & Plan:

## 2015-05-15 NOTE — Progress Notes (Signed)
Pre visit review using our clinic review tool, if applicable. No additional management support is needed unless otherwise documented below in the visit note. 

## 2015-05-15 NOTE — Patient Instructions (Signed)
Follow up in 1 year or as needed We'll notify you of your lab results and make any changes if needed Keep up the good work!  You look great! Call with any questions or concerns Happy Labor Day!!! 

## 2015-05-19 ENCOUNTER — Other Ambulatory Visit (HOSPITAL_COMMUNITY)
Admission: RE | Admit: 2015-05-19 | Discharge: 2015-05-19 | Disposition: A | Discharge status: Home / Self Care | Payer: 59 | Source: Ambulatory Visit | Attending: Otolaryngology | Admitting: Otolaryngology

## 2015-05-19 ENCOUNTER — Other Ambulatory Visit: Payer: Self-pay | Admitting: Otolaryngology

## 2015-05-19 DIAGNOSIS — E041 Nontoxic single thyroid nodule: Secondary | ICD-10-CM | POA: Insufficient documentation

## 2015-05-29 ENCOUNTER — Other Ambulatory Visit: Payer: Self-pay | Admitting: Family Medicine

## 2015-05-30 ENCOUNTER — Other Ambulatory Visit: Payer: Self-pay

## 2015-05-30 DIAGNOSIS — R52 Pain, unspecified: Secondary | ICD-10-CM

## 2015-05-30 MED ORDER — SUMATRIPTAN SUCCINATE 100 MG PO TABS: 100.0000 mg | ORAL_TABLET | ORAL | Status: DC | PRN

## 2015-05-30 NOTE — Telephone Encounter (Signed)
Medication filled to pharmacy as requested.   

## 2015-05-30 NOTE — Telephone Encounter (Signed)
Rx request faxed, approved by Dr. Tomi Likens. [facility]

## 2015-05-30 NOTE — Telephone Encounter (Signed)
last Ov 05/15/15  Alprazolam last filled 04/29/15 #60 with 0

## 2015-06-05 ENCOUNTER — Encounter (HOSPITAL_COMMUNITY): Payer: Self-pay

## 2015-06-09 ENCOUNTER — Telehealth: Payer: Self-pay | Admitting: Family Medicine

## 2015-06-09 DIAGNOSIS — N3010 Interstitial cystitis (chronic) without hematuria: Secondary | ICD-10-CM

## 2015-06-09 NOTE — Telephone Encounter (Signed)
Referral faxed to [facility]/awaiting appt/insurance [name] #[id number]

## 2015-06-09 NOTE — Telephone Encounter (Signed)
Relation to HE:[facility]  Call back number: [phone]   Reason for call:  Patient requesting a referral to [facility] for dr Louis Meckel regarding a follow up as per patient.

## 2015-06-09 NOTE — Telephone Encounter (Signed)
I placed this referral since pt was adamant she needed it. Please advise?

## 2015-06-16 ENCOUNTER — Other Ambulatory Visit: Payer: Self-pay | Admitting: Neurology

## 2015-06-16 MED ORDER — LAMOTRIGINE 200 MG PO TABS: 200.0000 mg | ORAL_TABLET | Freq: Every day | ORAL | Status: DC

## 2015-06-16 NOTE — Progress Notes (Signed)
Patient is here today to enroll into the [employer] [id number] study.    She signed the informed consent form and had her screening visit.  She notes mild benefit from lamotrigine 100 mg po bid for the dysesthesidas and we discussed increasing to 200 mg po bid

## 2015-07-16 ENCOUNTER — Encounter: Payer: Self-pay | Admitting: Neurology

## 2015-07-16 ENCOUNTER — Ambulatory Visit (INDEPENDENT_AMBULATORY_CARE_PROVIDER_SITE_OTHER): Payer: 59 | Admitting: Neurology

## 2015-07-16 VITALS — BP 132/70 | HR 78 | Resp 14 | Ht 69.0 in | Wt 121.0 lb

## 2015-07-16 DIAGNOSIS — F418 Other specified anxiety disorders: Secondary | ICD-10-CM | POA: Diagnosis not present

## 2015-07-16 DIAGNOSIS — M4722 Other spondylosis with radiculopathy, cervical region: Secondary | ICD-10-CM

## 2015-07-16 DIAGNOSIS — G4700 Insomnia, unspecified: Secondary | ICD-10-CM

## 2015-07-16 DIAGNOSIS — R413 Other amnesia: Secondary | ICD-10-CM

## 2015-07-16 DIAGNOSIS — R519 Headache, unspecified: Secondary | ICD-10-CM

## 2015-07-16 DIAGNOSIS — R5383 Other fatigue: Secondary | ICD-10-CM

## 2015-07-16 DIAGNOSIS — R208 Other disturbances of skin sensation: Secondary | ICD-10-CM | POA: Diagnosis not present

## 2015-07-16 DIAGNOSIS — R51 Headache: Secondary | ICD-10-CM | POA: Diagnosis not present

## 2015-07-16 DIAGNOSIS — M4712 Other spondylosis with myelopathy, cervical region: Secondary | ICD-10-CM

## 2015-07-16 DIAGNOSIS — G35 Multiple sclerosis: Secondary | ICD-10-CM | POA: Diagnosis not present

## 2015-07-16 DIAGNOSIS — N3941 Urge incontinence: Secondary | ICD-10-CM | POA: Diagnosis not present

## 2015-07-16 MED ORDER — VENLAFAXINE HCL ER 75 MG PO CP24: 75.0000 mg | ORAL_CAPSULE | Freq: Every day | ORAL | Status: DC

## 2015-07-16 MED ORDER — DIAZEPAM 5 MG PO TABS: 5.0000 mg | ORAL_TABLET | Freq: Three times a day (TID) | ORAL | Status: DC | PRN

## 2015-07-16 MED ORDER — KETOROLAC TROMETHAMINE 60 MG/2ML IM SOLN
60.0000 mg | Freq: Once | INTRAMUSCULAR | Status: AC
  Administered 2015-07-16: 60 mg via INTRAMUSCULAR

## 2015-07-16 MED ORDER — LAMOTRIGINE 200 MG PO TABS: 300.0000 mg | ORAL_TABLET | Freq: Two times a day (BID) | ORAL | Status: DC

## 2015-07-16 NOTE — Patient Instructions (Signed)
1.    Increase the lamotrigine to 3 tablets a day. You can take 1.5 pills twice a day. 2.    I am going to place you on diazepam 2-3 tablets per day to help with spasticity and anxiety. This may also help her bladder. I'm also going to increase your Effexor to a higher dose.

## 2015-07-16 NOTE — Progress Notes (Signed)
[facility]  PATIENT: Stacy Logan DOB: 1966/06/15  REFERRING DOCTOR OR PCP:  Annye Asa SOURCE: patient, EMR records from [facility].  MRI images on PACS  _________________________________   HISTORICAL  CHIEF COMPLAINT:  Chief Complaint  Patient presents with  . Multiple Sclerosis    Sts. she continues to tolerate Tecfidera.  Sts. pain in arms/hands is some improved since starting Lamictal.  She did stop the Gabapentin.  She resumed Amitriptyline for sleep. Sts. she was not able to tolerate Adderall for fatigue--sts. it caused nausea, and "my head wasn't right."  Today she c/o generalized facial pain, and has new c/o bowel urgency.  She is having increased bladder problems, but she sees urology for this./fim  . Facial Pain    HISTORY OF PRESENT ILLNESS:  Stacy Logan is a 49 yo woman with multiple sclerosis and h/o compressive myelopathy.   She is noting more trouble with her legs and bladder.     MS/spine history:   In 2001, she was noted to have left hearing loss and had an MRI of the brain.   She did not have numbness or weakness a that time.  She was told she likely had MS.   She saw a neurologist.  She never had an LP.   She reports she was told she had MS but was never started on any medication.   Starting about 5-6 years ago, she was noting more difficulty with gait and also noted some memory issues.   She was also having bladder issues with urgency and bladder incontinence x 5 years.      However, she had no health insurance at that time and did not follow up with neurology.   She saw Dr. Tomi Likens last year.   He ordered an MRI of the brian and cervical spine.   She had one enhancing lesion on the spine and one additional lesion.  An LP was ordered but she opted not to do the test.   She was started on tecfidera.   She has had some itching and flushing, especially if she does not take after food.     She also was found to have severe spinal stenosis and  myelopathy.   She was sent to Dr. Arnoldo Morale of Neurosurgery.   She underwent  ACDF from C3-C7 06/2014.    She reports she is having more trouble with her arms since the surgery.   The worse pain is in the 2nd and 3rd fingers but she has altered sensation with some pain in the other fingers as well.   She also has right > left lower neck pain.  Bladder is about the same (maybe slightly better) as it was before surgery.      Gait/strength/sensation/pain:  She feels her legs are heavier.   She feels the left leg is dragging more.   She has a lot of cramps in her legs bilaterally,  left > right .  Her gait is wide with a left foot drop that she feels is doing worse.   She notes some left leg numbness and bilateral feet numbness.   She notes weakness in both arms. Baclofen helps spasticity but it makes her sleepy.    We started lamotrigine and she is doing better with it than gabapentin.  Dose was increased to 200 mg po bid.   She is now taking 900 mg po gabapentin tid.     Percocet 10 mg every morning helps pain.  If she takes at night., she gets insomnia.   She takes one xanax daily for anxiety and prn inravaginal valium for bladder spasms.     Bladder:   She continues to have urinary frequency and incontinence.   She sees Dr. Kary Kos.   She takes intravaginal valium if she gets spasms there.    Vision/eyes:   She notes a cataract that needs removal but has not had definite MS related issue.   Peripheral vision is reduced.   She has had a right eye twitching x 6 months.   She does worse in bright lights and when she is tired at night.    Fatigue/sleep:  She notes much more fatigue the past year.   She feels this is physical and mental.    She sleeps poorly at night.   She gets 'worm sensation' in her legs and she has trouble getting comfortable at night.    She gets cramps in her outer lower legs at night.   She has insomnia.   Amitriptyline, gabapentin are taken at bedtime without  Mood/cognition:   She  has depression and anxiety. She gets agoraphobia when shopping.     She takes Effexor 37.5 with some benefit with mood.   She notes a lot of difficulty with cognition and attention.  Specifically, organizing is difficult.   She is very forgetful and distractible.   She has trouble following through with tasks  Other:  She continues to have hot flashes despite menopause years ago.     Migraine:  She is having migraines 2-4 times monthly.   Imitrex helps but incompletely   I personally reviewed the MRI of the brain dated 01/12/2015 and the MRIs of the cervical spine dated 02/01/2015 and 05/22/2014.   The MRI of the brain shows white matter foci predominantly in the deep white matter but also in the periventricular and subcortical white matter of both hemispheres. The brainstem appears normal. There is no significant atrophy. The MRI of the cervical spine from 2015 showed severe spinal stenosis at C5-C6 and milder spinal stenosis at C6-C7 and C4-C5. There is an enhancing focus within the spinal cord adjacent to C5-C6 and she has another hyperintense focus at C3-C4. On the second MRI, done without contrast, she is status post C3-C7 ACDF.       REVIEW OF SYSTEMS: Constitutional: No fevers, chills, sweats, or change in appetite Eyes: No visual changes, double vision, eye pain Ear, nose and throat: No hearing loss, ear pain, nasal congestion, sore throat Cardiovascular: No chest pain, palpitations Respiratory: No shortness of breath at rest or with exertion.   No wheezes GastrointestinaI: No nausea, vomiting, diarrhea, abdominal pain, fecal incontinence Genitourinary: No dysuria, urinary retention or frequency.  No nocturia. Musculoskeletal: No neck pain, back pain Integumentary: No rash, pruritus, skin lesions Neurological: as above Psychiatric: No depression at this time.  No anxiety Endocrine: No palpitations, diaphoresis, change in appetite, change in weigh or increased  thirst Hematologic/Lymphatic: No anemia, purpura, petechiae. Allergic/Immunologic: No itchy/runny eyes, nasal congestion, recent allergic reactions, rashes  ALLERGIES: Allergies  Allergen Reactions  . Codeine     Makes her "busy", itchy  . Meperidine Nausea And Vomiting  . Meperidine Hcl Nausea And Vomiting    SEVERE N/V  . Morphine Other (See Comments)    "SKIN CRAWLS"    HOME MEDICATIONS:  Current outpatient prescriptions:  .  ALPRAZolam (XANAX) 0.5 MG tablet, TAKE ONE TABLET BY MOUTH TWICE DAILY AS NEEDED, Disp: 60 tablet, Rfl:  3 .  baclofen (LIORESAL) 10 MG tablet, Take 1 tablet (10 mg total) by mouth 2 (two) times daily as needed for muscle spasms., Disp: 60 tablet, Rfl: 5 .  Cholecalciferol (VITAMIN D3) 50000 UNITS CAPS, , Disp: , Rfl:  .  Cyanocobalamin (VITAMIN B-12) 2500 MCG SUBL, Place 2,500 mcg under the tongue daily., Disp: , Rfl:  .  Diazepam (VALIUM PO), Take by mouth., Disp: , Rfl:  .  Dimethyl Fumarate (TECFIDERA) 240 MG CPDR, Take 1 capsule by mouth 2 (two) times daily., Disp: , Rfl:  .  hydroxychloroquine (PLAQUENIL) 200 MG tablet, Take 200 mg by mouth daily., Disp: , Rfl:  .  lamoTRIgine (LAMICTAL) 200 MG tablet, Take 1 tablet (200 mg total) by mouth daily., Disp: 60 tablet, Rfl: 11 .  lamoTRIgine (LAMICTAL) 25 MG tablet, 1 pill daily x 1 wk then 1 pill bid x 1 wk, then 1 pill tid x 1 wk, then 2 pills bid for 1 week, Disp: 70 tablet, Rfl: 0 .  Meth-Hyo-M Bl-Na Phos-Ph Sal (URIBEL) 118 MG CAPS, Take 1 capsule by mouth 3 (three) times daily as needed., Disp: , Rfl:  .  naphazoline (NAPHCON) 0.1 % ophthalmic solution, Place 1 drop into both eyes 4 (four) times daily as needed for irritation., Disp: , Rfl:  .  naproxen (NAPROSYN) 500 MG tablet, Take 500 mg by mouth. With first dose of imitrex, Disp: , Rfl:  .  OVER THE COUNTER MEDICATION, Take 250 mg by mouth daily., Disp: , Rfl:  .  oxyCODONE-acetaminophen (PERCOCET) 10-325 MG per tablet, Take 1 tablet by mouth every  4 (four) hours as needed for pain., Disp: , Rfl:  .  SUMAtriptan (IMITREX) 100 MG tablet, Take 1 tablet (100 mg total) by mouth every 2 (two) hours as needed for migraine. May repeat in 2 hours if headache persists or recurs., Disp: 10 tablet, Rfl: 6 .  venlafaxine XR (EFFEXOR-XR) 37.5 MG 24 hr capsule, , Disp: , Rfl:  .  Vitamin D, Ergocalciferol, (DRISDOL) 50000 UNITS CAPS capsule, Take 1 capsule (50,000 Units total) by mouth every 7 (seven) days., Disp: 4 capsule, Rfl: 2 .  etodolac (LODINE) 400 MG tablet, Take 400 mg by mouth 2 (two) times daily., Disp: , Rfl:   PAST MEDICAL HISTORY: Past Medical History  Diagnosis Date  . Arthritis   . Depression   . Migraines   . History of pneumothorax     1988-- SPONTANEOUS--  RESOLVED W/ CHEST TUBE  . Lesion of bladder   . H/O cold sores   . Borderline diabetes   . Frequency of urination   . Urgency of urination   . Nocturia   . Seasonal allergies   . Anal fissure   . Anxiety   . IC (interstitial cystitis)   . Pre-eclampsia   . MS (multiple sclerosis) ([place])     MRI showed plague on her brain  . RSD (reflex sympathetic dystrophy)     PAST SURGICAL HISTORY: Past Surgical History  Procedure Laterality Date  . Tonsillectomy and adenoidectomy  1972  . D & c hysteroscopy with polypectomy  12-02-2003  . Cesarean section  1994  . Dx laparoscopy/  fulgeration endometriosis/  appendectomy  1985  . Cystoscopy with hydrodistension and biopsy Bilateral 02/15/2014    Procedure: CYSTOSCOPY  BILATERAL RETROGRADE PYLOGRAM, HYDRODISTENSION, INSTILATION OF MARCAINE AND PYRIDIUM;  Surgeon: Ardis Hughs, MD;  Location: [facility];  Service: Urology;  Laterality: Bilateral;  . Appendectomy    .  Dilation and curettage of uterus    . Anterior cervical decompression/discectomy fusion 4 levels N/A 07/03/2014    Procedure: ANTERIOR CERVICAL DECOMPRESSION/DISCECTOMY FUSION 4 LEVELS;  Surgeon: Newman Pies, MD;  Location: [facility];   Service: Neurosurgery;  Laterality: N/A;  C3-4 C4-5 C5-6 C6-7 Anterior cervical decompression/diskectomy/fusion/interbody prosthesis/plate    FAMILY HISTORY: Family History  Problem Relation Age of Onset  . Hypertension Mother   . Diabetes Father   . Prostate cancer Father   . Lung cancer Father   . Cancer Father     cheek of mouth  . Diabetes Paternal Grandmother   . Diabetes Paternal Grandfather   . Diabetes Paternal Uncle     x 4  . Diabetes Paternal Aunt   . Colon cancer Maternal Uncle   . Esophageal cancer Neg Hx   . Rectal cancer Neg Hx   . Stomach cancer Neg Hx     SOCIAL HISTORY:  Social History   Social History  . Marital Status: Single    Spouse Name: N/A  . Number of Children: 1  . Years of Education: N/A   Occupational History  . self-employed house cleaning    Social History Main Topics  . Smoking status: Former Smoker -- 0.50 packs/day for 20 years    Types: Cigarettes    Quit date: 01/17/2011  . Smokeless tobacco: Never Used  . Alcohol Use: No  . Drug Use: No  . Sexual Activity: No   Other Topics Concern  . Not on file   Social History Narrative     PHYSICAL EXAM  Filed Vitals:   07/16/15 1513  BP: 132/70  Pulse: 78  Resp: 14  Height: 5\' 9"  (1.753 m)  Weight: 121 lb (54.885 kg)    Body mass index is 17.86 kg/(m^2).   General: The patient is well-developed and well-nourished and in no acute distress  Eyes:  Funduscopic exam shows normal optic discs and retinal vessels.  Neck: The neck is supple, no carotid bruits are noted.  The neck is nontender.  Cardiovascular: The heart has a regular rate and rhythm with a normal S1 and S2. There were no murmurs, gallops or rubs. Lungs are clear to auscultation.  Skin: Extremities are without significant edema.  Musculoskeletal:  Back is nontender  Neurologic Exam  Mental status: The patient is alert and oriented x 3 at the time of the examination. The patient has apparent normal recent  and remote memory, with an apparently normal attention span and concentration ability.   Speech is normal.  Cranial nerves: Extraocular movements are full. Pupils are equal, round, and reactive to light and accomodation.  Visual fields are full.  Facial symmetry is present. There is good facial sensation to soft touch bilaterally.Facial strength is normal.  Trapezius and sternocleidomastoid strength is normal. No dysarthria is noted.  The tongue is midline, and the patient has symmetric elevation of the soft palate. No obvious hearing deficits are noted.  Motor:  Muscle bulk is normal.   Tone is normal. Strength is  4+ / 5 in the triceps and 5- to 5/5 elsewhere in arms and 5/5 in legs.   Sensory: Sensory testing shows decreased sensation to touch and vibration in left C6 and C7 dermatomes of hand.  Also mild decreased vibration in left leg, relative to right  Coordination: Cerebellar testing reveals good finger-nose-finger and ok heel-to-shin bilaterally (slower on right but accurate).  Gait and station: Station is normal.   Gait is normal. Tandem gait  is wide. Romberg is negative.   Reflexes: Deep tendon reflexes are increased bilaterally with spread at the knees but no clonus.   Plantar responses are flexor.    DIAGNOSTIC DATA (LABS, IMAGING, TESTING) - I reviewed patient records, labs, notes, testing and imaging myself where available.  Lab Results  Component Value Date   WBC 6.9 05/15/2015   HGB 10.9* 05/15/2015   HCT 33.5* 05/15/2015   MCV 83.2 05/15/2015   PLT 219.0 05/15/2015      Component Value Date/Time   NA 139 05/15/2015 0842   K 3.9 05/15/2015 0842   CL 103 05/15/2015 0842   CO2 30 05/15/2015 0842   GLUCOSE 114* 05/15/2015 0842   BUN 15 05/15/2015 0842   CREATININE 0.63 05/15/2015 0842   CREATININE 0.55 12/26/2014 1042   CALCIUM 9.2 05/15/2015 0842   PROT 7.0 05/15/2015 0842   ALBUMIN 4.2 05/15/2015 0842   AST 12 05/15/2015 0842   ALT 10 05/15/2015 0842    ALKPHOS 66 05/15/2015 0842   BILITOT 0.4 05/15/2015 0842   GFRNONAA >89 12/26/2014 1042   GFRNONAA >90 06/27/2014 1600   GFRAA >89 12/26/2014 1042   GFRAA >90 06/27/2014 1600   Lab Results  Component Value Date   CHOL 161 05/15/2015   HDL 62.60 05/15/2015   LDLCALC 86 05/15/2015   TRIG 61.0 05/15/2015   CHOLHDL 3 05/15/2015   Lab Results  Component Value Date   HGBA1C 5.9 05/15/2015   Lab Results  Component Value Date   VITAMINB12 154* 03/28/2015   Lab Results  Component Value Date   TSH 0.87 05/15/2015       ASSESSMENT AND PLAN  MS (multiple sclerosis) ([place])  Cervical spondylosis with myelopathy and radiculopathy  Depression with anxiety  Dysesthesia  Insomnia  Memory disturbance  Other fatigue  Urge incontinence  1.   Toradol 60 mg IM now 2.   Increase lamotrigine to 200 mg tid (or 300 mg po bid) 3.   Diazepam 5 mg po bid, increase Effexor to 75 mg 4.  Stay active 5.   She will return to see me in 4 months or sooner if she has new or worsening neurologic symptoms.       Blossie Raffel A. Felecia Shelling, MD, PhD 56/15/3794, 3:27 PM Certified in Neurology, Clinical Neurophysiology, Sleep Medicine, Pain Medicine and Neuroimaging  [facility] [address], [place], [place] [address] [phone]

## 2015-07-18 ENCOUNTER — Ambulatory Visit: Payer: 59 | Admitting: Neurology

## 2015-07-26 ENCOUNTER — Other Ambulatory Visit: Payer: Self-pay | Admitting: Neurology

## 2015-07-29 ENCOUNTER — Encounter (HOSPITAL_BASED_OUTPATIENT_CLINIC_OR_DEPARTMENT_OTHER): Payer: Self-pay | Admitting: *Deleted

## 2015-07-29 ENCOUNTER — Emergency Department (HOSPITAL_BASED_OUTPATIENT_CLINIC_OR_DEPARTMENT_OTHER): Payer: 59

## 2015-07-29 ENCOUNTER — Emergency Department (HOSPITAL_BASED_OUTPATIENT_CLINIC_OR_DEPARTMENT_OTHER)
Admission: EM | Admit: 2015-07-29 | Discharge: 2015-07-29 | Disposition: A | Discharge status: Home / Self Care | Payer: 59 | Attending: Emergency Medicine | Admitting: Emergency Medicine

## 2015-07-29 DIAGNOSIS — Y9389 Activity, other specified: Secondary | ICD-10-CM | POA: Insufficient documentation

## 2015-07-29 DIAGNOSIS — F419 Anxiety disorder, unspecified: Secondary | ICD-10-CM | POA: Insufficient documentation

## 2015-07-29 DIAGNOSIS — Y92009 Unspecified place in unspecified non-institutional (private) residence as the place of occurrence of the external cause: Secondary | ICD-10-CM | POA: Diagnosis not present

## 2015-07-29 DIAGNOSIS — M1990 Unspecified osteoarthritis, unspecified site: Secondary | ICD-10-CM | POA: Insufficient documentation

## 2015-07-29 DIAGNOSIS — Z791 Long term (current) use of non-steroidal anti-inflammatories (NSAID): Secondary | ICD-10-CM | POA: Diagnosis not present

## 2015-07-29 DIAGNOSIS — G35 Multiple sclerosis: Secondary | ICD-10-CM | POA: Insufficient documentation

## 2015-07-29 DIAGNOSIS — S0993XA Unspecified injury of face, initial encounter: Secondary | ICD-10-CM | POA: Diagnosis present

## 2015-07-29 DIAGNOSIS — S0121XA Laceration without foreign body of nose, initial encounter: Secondary | ICD-10-CM | POA: Diagnosis not present

## 2015-07-29 DIAGNOSIS — Z8742 Personal history of other diseases of the female genital tract: Secondary | ICD-10-CM | POA: Diagnosis not present

## 2015-07-29 DIAGNOSIS — W1839XA Other fall on same level, initial encounter: Secondary | ICD-10-CM | POA: Insufficient documentation

## 2015-07-29 DIAGNOSIS — F329 Major depressive disorder, single episode, unspecified: Secondary | ICD-10-CM | POA: Insufficient documentation

## 2015-07-29 DIAGNOSIS — S0990XA Unspecified injury of head, initial encounter: Secondary | ICD-10-CM | POA: Insufficient documentation

## 2015-07-29 DIAGNOSIS — Y998 Other external cause status: Secondary | ICD-10-CM | POA: Insufficient documentation

## 2015-07-29 DIAGNOSIS — Z8709 Personal history of other diseases of the respiratory system: Secondary | ICD-10-CM | POA: Diagnosis not present

## 2015-07-29 DIAGNOSIS — Z87891 Personal history of nicotine dependence: Secondary | ICD-10-CM | POA: Insufficient documentation

## 2015-07-29 DIAGNOSIS — Z8719 Personal history of other diseases of the digestive system: Secondary | ICD-10-CM | POA: Insufficient documentation

## 2015-07-29 DIAGNOSIS — G43909 Migraine, unspecified, not intractable, without status migrainosus: Secondary | ICD-10-CM | POA: Insufficient documentation

## 2015-07-29 DIAGNOSIS — Z79899 Other long term (current) drug therapy: Secondary | ICD-10-CM | POA: Insufficient documentation

## 2015-07-29 MED ORDER — OXYCODONE-ACETAMINOPHEN 5-325 MG PO TABS
2.0000 | ORAL_TABLET | Freq: Once | ORAL | Status: AC
  Administered 2015-07-29: 2 via ORAL
  Filled 2015-07-29: qty 2

## 2015-07-29 NOTE — ED Provider Notes (Signed)
CSN: [id number]   Arrival date & time 07/29/15 1658  History  By signing my name below, I, Stacy Logan, attest that this documentation has been prepared under the direction and in the presence of Stacy Quale, MD. Electronically Signed: Altamease Logan, ED Scribe. 07/29/2015. 7:40 PM.  Chief Complaint  Patient presents with  . Fall    HPI The history is provided by the patient. No language interpreter was used.   Stacy Logan is a 49 y.o. female with history of MS and RSD who presents to the Emergency Department complaining of a fall tonight at home. Pt states that her left leg gave out, which is not new for her, and she fell forward to the concrete. Associated symptoms include a laceration at the bridge of the nose and diffuse soreness that is worse at the upper body. Pt denies LOC. No anticoagulation. Last tetanus was 4 years ago. Her MS is managed by Dr. Felecia Logan. Allergies to morphine, demerol, and codeine.   Past Medical History  Diagnosis Date  . Arthritis   . Depression   . Migraines   . History of pneumothorax     1988-- SPONTANEOUS--  RESOLVED W/ CHEST TUBE  . Lesion of bladder   . H/O cold sores   . Borderline diabetes   . Frequency of urination   . Urgency of urination   . Nocturia   . Seasonal allergies   . Anal fissure   . Anxiety   . IC (interstitial cystitis)   . Pre-eclampsia   . MS (multiple sclerosis) ([place])     MRI showed plague on her brain  . RSD (reflex sympathetic dystrophy)     Past Surgical History  Procedure Laterality Date  . Tonsillectomy and adenoidectomy  1972  . D & c hysteroscopy with polypectomy  12-02-2003  . Cesarean section  1994  . Dx laparoscopy/  fulgeration endometriosis/  appendectomy  1985  . Cystoscopy with hydrodistension and biopsy Bilateral 02/15/2014    Procedure: CYSTOSCOPY  BILATERAL RETROGRADE PYLOGRAM, HYDRODISTENSION, INSTILATION OF MARCAINE AND PYRIDIUM;  Surgeon: Stacy Hughs, MD;  Location: [facility];  Service: Urology;  Laterality: Bilateral;  . Appendectomy    . Dilation and curettage of uterus    . Anterior cervical decompression/discectomy fusion 4 levels N/A 07/03/2014    Procedure: ANTERIOR CERVICAL DECOMPRESSION/DISCECTOMY FUSION 4 LEVELS;  Surgeon: Stacy Pies, MD;  Location: [facility];  Service: Neurosurgery;  Laterality: N/A;  C3-4 C4-5 C5-6 C6-7 Anterior cervical decompression/diskectomy/fusion/interbody prosthesis/plate    Family History  Problem Relation Age of Onset  . Hypertension Mother   . Diabetes Father   . Prostate cancer Father   . Lung cancer Father   . Cancer Father     cheek of mouth  . Diabetes Paternal Grandmother   . Diabetes Paternal Grandfather   . Diabetes Paternal Uncle     x 4  . Diabetes Paternal Aunt   . Colon cancer Maternal Uncle   . Esophageal cancer Neg Hx   . Rectal cancer Neg Hx   . Stomach cancer Neg Hx     Social History  Substance Use Topics  . Smoking status: Former Smoker -- 0.50 packs/day for 20 years    Types: Cigarettes    Quit date: 01/17/2011  . Smokeless tobacco: Never Used  . Alcohol Use: No     Review of Systems  Constitutional: Negative for chills and fatigue.  HENT: Negative for congestion, ear pain and nosebleeds.  Eyes: Negative for pain and redness.  Gastrointestinal: Negative for nausea, vomiting, abdominal pain and diarrhea.  Genitourinary: Negative for dysuria, urgency, hematuria and flank pain.  Musculoskeletal: Positive for myalgias. Negative for back pain and neck pain.  Skin: Positive for wound (to bridge of nose).  Neurological: Negative for dizziness, syncope and headaches.  Hematological: Does not bruise/bleed easily.   Home Medications   Prior to Admission medications   Medication Sig Start Date End Date Taking? Authorizing Logan  ALPRAZolam Stacy Logan) 0.5 MG tablet TAKE ONE TABLET BY MOUTH TWICE DAILY AS NEEDED 05/30/15   Stacy Minium, MD  baclofen (LIORESAL) 10 MG  tablet Take 1 tablet (10 mg total) by mouth 2 (two) times daily as needed for muscle spasms. 04/15/15   Stacy Bottom, MD  Cholecalciferol (VITAMIN D3) 50000 UNITS CAPS  03/28/15   Stacy Provider, MD  Cyanocobalamin (VITAMIN B-12) 2500 MCG SUBL Place 2,500 mcg under the tongue daily.    Stacy Provider, MD  diazepam (VALIUM) 5 MG tablet Take 1 tablet (5 mg total) by mouth every 8 (eight) hours as needed. 07/16/15   Stacy Bottom, MD  Dimethyl Fumarate (TECFIDERA) 240 MG CPDR Take 1 capsule by mouth 2 (two) times daily.    Stacy Provider, MD  etodolac (LODINE) 400 MG tablet Take 400 mg by mouth 2 (two) times daily.    Stacy Provider, MD  hydroxychloroquine (PLAQUENIL) 200 MG tablet Take 200 mg by mouth daily.    Stacy Provider, MD  lamoTRIgine (LAMICTAL) 200 MG tablet Take 1.5 tablets (300 mg total) by mouth 2 (two) times daily. 07/16/15   Stacy Bottom, MD  Meth-Hyo-M Barnett Hatter Phos-Ph Sal (URIBEL) 118 MG CAPS Take 1 capsule by mouth 3 (three) times daily as needed.    Stacy Provider, MD  naphazoline (NAPHCON) 0.1 % ophthalmic solution Place 1 drop into both eyes 4 (four) times daily as needed for irritation.    Stacy Provider, MD  naproxen (NAPROSYN) 500 MG tablet Take 500 mg by mouth. With first dose of imitrex    Stacy Provider, MD  OVER THE COUNTER MEDICATION Take 250 mg by mouth daily.    Stacy Provider, MD  oxyCODONE-acetaminophen (PERCOCET) 10-325 MG per tablet Take 1 tablet by mouth every 4 (four) hours as needed for pain.    Stacy Provider, MD  SUMAtriptan (IMITREX) 100 MG tablet Take 1 tablet (100 mg total) by mouth every 2 (two) hours as needed for migraine. May repeat in 2 hours if headache persists or recurs. 05/30/15   Stacy Partridge, DO  venlafaxine XR (EFFEXOR-XR) 75 MG 24 hr capsule Take 1 capsule (75 mg total) by mouth daily with breakfast. 07/16/15   Stacy Bottom, MD  Vitamin D, Ergocalciferol, (DRISDOL) 50000 UNITS CAPS capsule Take 1  capsule (50,000 Units total) by mouth every 7 (seven) days. 03/28/15   Stacy Minium, MD    Allergies  Codeine; Meperidine; Meperidine hcl; and Morphine  Triage Vitals: BP 135/97 mmHg  Pulse 107  Temp(Src) 98.3 F (36.8 C) (Oral)  Resp 20  Ht 5' 9.75" (1.772 m)  Wt 121 lb (54.885 kg)  BMI 17.48 kg/m2  SpO2 98%  Physical Exam  Constitutional: She is oriented to person, place, and time. She appears well-developed and well-nourished. No distress.  HENT:  Head: Normocephalic. Head is with laceration (at bridge of nose as depicted in the diagram).    Eyes: EOM are normal.  Neck: Normal range of motion.  Cardiovascular: Normal rate, regular  rhythm and normal heart sounds.   Pulmonary/Chest: Effort normal and breath sounds normal.  Abdominal: Soft. She exhibits no distension. There is no tenderness.  Musculoskeletal: Normal range of motion.  Neurological: She is alert and oriented to person, place, and time.  Mildly decreased strength which is symmetric throughout all limbs  Skin: Skin is warm and dry.  Psychiatric: She has a normal mood and affect. Judgment normal.  Nursing note and vitals reviewed.   ED Course  Procedures   DIAGNOSTIC STUDIES: Oxygen Saturation is 98% on RA, normal by my interpretation.    COORDINATION OF CARE: 7:32 PM Discussed treatment plan which includes imaging and pain management with pt at bedside and pt agreed to plan.  Labs Reviewed - No data to display  Imaging Review Ct Head Wo Contrast  07/29/2015  CLINICAL DATA:  Fall with head injury on coffee table. Dizziness and light sensitivity. Bilateral neck pain. History of multiple sclerosis. EXAM: CT HEAD WITHOUT CONTRAST CT CERVICAL SPINE WITHOUT CONTRAST TECHNIQUE: Multidetector CT imaging of the head and cervical spine was performed following the standard protocol without intravenous contrast. Multiplanar CT image reconstructions of the cervical spine were also generated. COMPARISON:  Brain and  cervical spine MRI from 01/12/2015 and 02/01/2015 respectively FINDINGS: CT HEAD FINDINGS Skull and Sinuses:Left forehead swelling without acute fracture. Nasal arch deformities are chronic, stable from 07/30/2007 head CT. Orbits: No acute abnormality. Brain: No evidence of acute infarction, hemorrhage, hydrocephalus, or mass lesion/mass effect. Multi focal cerebral white matter patchy low-density, underestimated relative to April 2016 brain MRI, in this patient with history of multiple sclerosis. CT CERVICAL SPINE FINDINGS Negative for acute fracture or subluxation. No prevertebral edema. No gross cervical canal hematoma. C3-4 to C6-7 anterior cervical discectomy with ventral plate and screw fixation. There is doubtful bony fusion at the C3-4 level. The other levels are convincingly fused. No evidence of hardware loosening or fracture. 16 mm thyroid nodule on the right, size stable since 07/30/2007 CT. Stones incidentally noted in both parotid tails IMPRESSION: 1. No evidence of acute intracranial or cervical spine injury. 2. Multilevel cervical fusion as described above. 3. Cerebral white matter disease correlating with history of multiple sclerosis. Electronically Signed   By: Monte Fantasia M.D.   On: 07/29/2015 21:30   Ct Cervical Spine Wo Contrast  07/29/2015  CLINICAL DATA:  Fall with head injury on coffee table. Dizziness and light sensitivity. Bilateral neck pain. History of multiple sclerosis. EXAM: CT HEAD WITHOUT CONTRAST CT CERVICAL SPINE WITHOUT CONTRAST TECHNIQUE: Multidetector CT imaging of the head and cervical spine was performed following the standard protocol without intravenous contrast. Multiplanar CT image reconstructions of the cervical spine were also generated. COMPARISON:  Brain and cervical spine MRI from 01/12/2015 and 02/01/2015 respectively FINDINGS: CT HEAD FINDINGS Skull and Sinuses:Left forehead swelling without acute fracture. Nasal arch deformities are chronic, stable from  07/30/2007 head CT. Orbits: No acute abnormality. Brain: No evidence of acute infarction, hemorrhage, hydrocephalus, or mass lesion/mass effect. Multi focal cerebral white matter patchy low-density, underestimated relative to April 2016 brain MRI, in this patient with history of multiple sclerosis. CT CERVICAL SPINE FINDINGS Negative for acute fracture or subluxation. No prevertebral edema. No gross cervical canal hematoma. C3-4 to C6-7 anterior cervical discectomy with ventral plate and screw fixation. There is doubtful bony fusion at the C3-4 level. The other levels are convincingly fused. No evidence of hardware loosening or fracture. 16 mm thyroid nodule on the right, size stable since 07/30/2007 CT. Stones incidentally noted  in both parotid tails IMPRESSION: 1. No evidence of acute intracranial or cervical spine injury. 2. Multilevel cervical fusion as described above. 3. Cerebral white matter disease correlating with history of multiple sclerosis. Electronically Signed   By: Monte Fantasia M.D.   On: 07/29/2015 21:30    I personally reviewed and evaluated these images and lab results as a part of my medical decision-making.   MDM  Patient seen and evaluated in stable condition.  At neurologic baseline.  Small laceration to nose.  CT head and neck negative for acute process.  Laceration cleaned and closed with skin glue without complication.  Patient and sister updated on results and plan of care.  Patient discharged home in stable condition. Final diagnoses:  Head injury, initial encounter    1. Head injury  2. Facial laceration  I personally performed the services described in this documentation, which was scribed in my presence. The recorded information has been reviewed and is accurate.    Stacy Quale, MD 07/31/15 [id number]

## 2015-07-29 NOTE — Discharge Instructions (Signed)
Head Injury, Adult  You have received a head injury. It does not appear serious at this time. Headaches and vomiting are common following head injury. It should be easy to awaken from sleeping. Sometimes it is necessary for you to stay in the emergency department for a while for observation. Sometimes admission to the hospital may be needed. After injuries such as yours, most problems occur within the first 24 hours, but side effects may occur up to 7-10 days after the injury. It is important for you to carefully monitor your condition and contact your health care provider or seek immediate medical care if there is a change in your condition. WHAT ARE THE TYPES OF HEAD INJURIES? Head injuries can be as minor as a bump. Some head injuries can be more severe. More severe head injuries include:  A jarring injury to the brain (concussion).  A bruise of the brain (contusion). This mean there is bleeding in the brain that can cause swelling.  A cracked skull (skull fracture).  Bleeding in the brain that collects, clots, and forms a bump (hematoma). WHAT CAUSES A HEAD INJURY? A serious head injury is most likely to happen to someone who is in a car wreck and is not wearing a seat belt. Other causes of major head injuries include bicycle or motorcycle accidents, sports injuries, and falls. HOW ARE HEAD INJURIES DIAGNOSED? A complete history of the event leading to the injury and your current symptoms will be helpful in diagnosing head injuries. Many times, pictures of the brain, such as CT or MRI are needed to see the extent of the injury. Often, an overnight hospital stay is necessary for observation.  WHEN SHOULD I SEEK IMMEDIATE MEDICAL CARE?  You should get help right away if:  You have confusion or drowsiness.  You feel sick to your stomach (nauseous) or have continued, forceful vomiting.  You have dizziness or unsteadiness that is getting worse.  You have severe, continued headaches not  relieved by medicine. Only take over-the-counter or prescription medicines for pain, fever, or discomfort as directed by your health care provider.  You do not have normal function of the arms or legs or are unable to walk.  You notice changes in the black spots in the center of the colored part of your eye (pupil).  You have a clear or bloody fluid coming from your nose or ears.  You have a loss of vision. During the next 24 hours after the injury, you must stay with someone who can watch you for the warning signs. This person should contact local emergency services (911 in the [place] if you have seizures, you become unconscious, or you are unable to wake up. HOW CAN I PREVENT A HEAD INJURY IN THE FUTURE? The most important factor for preventing major head injuries is avoiding motor vehicle accidents. To minimize the potential for damage to your head, it is crucial to wear seat belts while riding in motor vehicles. Wearing helmets while bike riding and playing collision sports (like football) is also helpful. Also, avoiding dangerous activities around the house will further help reduce your risk of head injury.  WHEN CAN I RETURN TO NORMAL ACTIVITIES AND ATHLETICS? You should be reevaluated by your health care provider before returning to these activities. If you have any of the following symptoms, you should not return to activities or contact sports until 1 week after the symptoms have stopped:  Persistent headache.  Dizziness or vertigo.  Poor attention and concentration.  Confusion.  Memory problems.  Nausea or vomiting.  Fatigue or tire easily.  Irritability.  Intolerant of bright lights or loud noises.  Anxiety or depression.  Disturbed sleep. MAKE SURE YOU:   Understand these instructions.  Will watch your condition.  Will get help right away if you are not doing well or get worse.   This information is not intended to replace advice given to you by your health  care provider. Make sure you discuss any questions you have with your health care provider.   Document Released: 09/06/2005 Document Revised: 09/27/2014 Document Reviewed: 05/14/2013 Elsevier Interactive Patient Education 2016 [employer]  Facial Laceration  Your laceration was closed with skin glue.  This should come off on its own in a few days.  If it does not come off in 5-6 days take a warm wash cloth and gently wipe it away.   A facial laceration is a cut on the face. These injuries can be painful and cause bleeding. Lacerations usually heal quickly, but they need special care to reduce scarring. DIAGNOSIS  Your health care provider will take a medical history, ask for details about how the injury occurred, and examine the wound to determine how deep the cut is. TREATMENT  Some facial lacerations may not require closure. Others may not be able to be closed because of an increased risk of infection. The risk of infection and the chance for successful closure will depend on various factors, including the amount of time since the injury occurred. The wound may be cleaned to help prevent infection. If closure is appropriate, pain medicines may be given if needed. Your health care provider will use stitches (sutures), wound glue (adhesive), or skin adhesive strips to repair the laceration. These tools bring the skin edges together to allow for faster healing and a better cosmetic outcome. If needed, you may also be given a tetanus shot. HOME CARE INSTRUCTIONS  Only take over-the-counter or prescription medicines as directed by your health care provider.  Follow your health care provider's instructions for wound care. These instructions will vary depending on the technique used for closing the wound. For Sutures:  Keep the wound clean and dry.   If you were given a bandage (dressing), you should change it at least once a day. Also change the dressing if it becomes wet or dirty, or as  directed by your health care provider.   Wash the wound with soap and water 2 times a day. Rinse the wound off with water to remove all soap. Pat the wound dry with a clean towel.   After cleaning, apply a thin layer of the antibiotic ointment recommended by your health care provider. This will help prevent infection and keep the dressing from sticking.   You may shower as usual after the first 24 hours. Do not soak the wound in water until the sutures are removed.   Get your sutures removed as directed by your health care provider. With facial lacerations, sutures should usually be taken out after 4-5 days to avoid stitch marks.   Wait a few days after your sutures are removed before applying any makeup. For Skin Adhesive Strips:  Keep the wound clean and dry.   Do not get the skin adhesive strips wet. You may bathe carefully, using caution to keep the wound dry.   If the wound gets wet, pat it dry with a clean towel.   Skin adhesive strips will fall off on their own. You may trim  the strips as the wound heals. Do not remove skin adhesive strips that are still stuck to the wound. They will fall off in time.  For Wound Adhesive:  You may briefly wet your wound in the shower or bath. Do not soak or scrub the wound. Do not swim. Avoid periods of heavy sweating until the skin adhesive has fallen off on its own. After showering or bathing, gently pat the wound dry with a clean towel.   Do not apply liquid medicine, cream medicine, ointment medicine, or makeup to your wound while the skin adhesive is in place. This may loosen the film before your wound is healed.   If a dressing is placed over the wound, be careful not to apply tape directly over the skin adhesive. This may cause the adhesive to be pulled off before the wound is healed.   Avoid prolonged exposure to sunlight or tanning lamps while the skin adhesive is in place.  The skin adhesive will usually remain in place for  5-10 days, then naturally fall off the skin. Do not pick at the adhesive film.  After Healing: Once the wound has healed, cover the wound with sunscreen during the day for 1 full year. This can help minimize scarring. Exposure to ultraviolet light in the first year will darken the scar. It can take 1-2 years for the scar to lose its redness and to heal completely.  SEEK MEDICAL CARE IF:  You have a fever. SEEK IMMEDIATE MEDICAL CARE IF:  You have redness, pain, or swelling around the wound.   You see ayellowish-white fluid (pus) coming from the wound.    This information is not intended to replace advice given to you by your health care provider. Make sure you discuss any questions you have with your health care provider.   Document Released: 10/14/2004 Document Revised: 09/27/2014 Document Reviewed: 04/19/2013 Elsevier Interactive Patient Education [employer].

## 2015-07-29 NOTE — ED Notes (Signed)
Tripped and fell. She has MS and her left leg gave out which is normal for her. Injury to her nose. Skin tear noted. Pain in her right forearm. She hit the left side of her face.

## 2015-08-10 ENCOUNTER — Other Ambulatory Visit: Payer: Self-pay | Admitting: Neurology

## 2015-09-05 ENCOUNTER — Telehealth: Payer: Self-pay | Admitting: Family Medicine

## 2015-09-05 DIAGNOSIS — H53459 Other localized visual field defect, unspecified eye: Secondary | ICD-10-CM

## 2015-09-05 NOTE — Telephone Encounter (Signed)
Caller name: Self   Can be reached: [phone]   Reason for call: Patient requesting referral to Dr. Gevena Cotton (Ophthalmologist) for her Peripheral vision

## 2015-09-05 NOTE — Telephone Encounter (Signed)
Referral placed.

## 2015-09-08 ENCOUNTER — Encounter: Payer: 59 | Admitting: Family Medicine

## 2015-09-18 ENCOUNTER — Other Ambulatory Visit: Payer: Self-pay | Admitting: Neurology

## 2015-10-29 ENCOUNTER — Telehealth: Payer: Self-pay | Admitting: Neurology

## 2015-10-29 NOTE — Telephone Encounter (Signed)
Helene Kelp with [facility] [phone] called to inform RN that she will be faxing PA forms for techfidera. She said pt is out of medication and is sending forms on urgent basis for reply.

## 2015-10-29 NOTE — Telephone Encounter (Signed)
PA forms for Tecfidera were received, have been completed and faxed to [facility] [place]/fim

## 2015-10-31 ENCOUNTER — Telehealth: Payer: Self-pay | Admitting: Neurology

## 2015-10-31 NOTE — Telephone Encounter (Signed)
Pt called and says since starting lamoTRIgine (LAMICTAL) 200 MG tablet she feels "crazy". She states that she cannot drive because she will get lost. When someone is speaking to her she can not comprehend what they are saying. She is having trouble forming sentences and finding the appropriate words. Says she is slurring a little and is stuttering. She would like to know if she can just stop the medication or if she needs to titrate off of it. Also, is there another medication that she may take? Please call and advise [phone]

## 2015-10-31 NOTE — Telephone Encounter (Signed)
I have spoken with Stacy Logan--per ov notes, she was doing well with Lamictal in October.  She sts. over the last 1-2 mos, she has had multiple sx--talking backwards, getting lost when she is driving.  She sts. she is certain these sx. are r/t Lamictal.  RAS is ooo today.  As she takes Lamictal for dysesthesias, not sz, I have advised ok to decrease to 1 tab bid for 3-4 days, then decrease to 1/2 tab bid for 3-4 days, then decrease to 1/2 tab daily for 3-4 days, then stop.  Have also given appt. with RAS on 11-04-15 to discuss.  She verbalized understanding of same; is agreeable with this plan/fim

## 2015-11-04 ENCOUNTER — Encounter: Payer: Self-pay | Admitting: Neurology

## 2015-11-04 ENCOUNTER — Ambulatory Visit (INDEPENDENT_AMBULATORY_CARE_PROVIDER_SITE_OTHER): Payer: BLUE CROSS/BLUE SHIELD | Admitting: Neurology

## 2015-11-04 VITALS — BP 120/84 | HR 86 | Resp 16 | Ht 69.75 in | Wt 120.4 lb

## 2015-11-04 DIAGNOSIS — G35 Multiple sclerosis: Secondary | ICD-10-CM | POA: Diagnosis not present

## 2015-11-04 DIAGNOSIS — R5383 Other fatigue: Secondary | ICD-10-CM | POA: Diagnosis not present

## 2015-11-04 DIAGNOSIS — M4712 Other spondylosis with myelopathy, cervical region: Secondary | ICD-10-CM | POA: Diagnosis not present

## 2015-11-04 DIAGNOSIS — M4722 Other spondylosis with radiculopathy, cervical region: Secondary | ICD-10-CM

## 2015-11-04 DIAGNOSIS — N3941 Urge incontinence: Secondary | ICD-10-CM

## 2015-11-04 DIAGNOSIS — G5620 Lesion of ulnar nerve, unspecified upper limb: Secondary | ICD-10-CM | POA: Insufficient documentation

## 2015-11-04 DIAGNOSIS — G5623 Lesion of ulnar nerve, bilateral upper limbs: Secondary | ICD-10-CM | POA: Diagnosis not present

## 2015-11-04 DIAGNOSIS — G43019 Migraine without aura, intractable, without status migrainosus: Secondary | ICD-10-CM | POA: Diagnosis not present

## 2015-11-04 DIAGNOSIS — F418 Other specified anxiety disorders: Secondary | ICD-10-CM

## 2015-11-04 DIAGNOSIS — R200 Anesthesia of skin: Secondary | ICD-10-CM

## 2015-11-04 MED ORDER — MODAFINIL 200 MG PO TABS: 200.0000 mg | ORAL_TABLET | Freq: Every day | ORAL | Status: DC

## 2015-11-04 NOTE — Progress Notes (Signed)
[facility]  PATIENT: Stacy Logan DOB: 1966-03-28  REFERRING DOCTOR OR PCP:  Annye Asa SOURCE: patient, EMR records from [facility].  MRI images on PACS  _________________________________   HISTORICAL  CHIEF COMPLAINT:  Chief Complaint  Patient presents with  . Multiple Sclerosis    At last ov, [place] c/o increased facial pain.  RAS increased her Lamictal to 200mg  tid.  She sts. she was fine for 1-2 mos., then began having more trouble with her memory, speech difficulty--"I made up my own words.".  She believes this is due to Lamictal, and so per phone conversation she is decreasing Lamictal--not sure if sx. are any better/fim  . Facial Pain    **Also sts. she has been out of Tecfidera for 2 weeks--needs new rx. sent in, but she isn't sure which pharmacy sends it to her/fim    HISTORY OF PRESENT ILLNESS:  Stacy Logan is a 50 yo woman with multiple sclerosis and h/o compressive myelopathy.     MS:   She is currently on Tecfidera for the MS.   She tolerates it fairly well but has had some itching.   She denies stomach upset.   She feels MS is mostly stable but has noted the the right foot is dragging more and fatigue is worse.     Gait/strength/sensation/pain:  She feels her legs are heavier.   She feels the right leg is dragging more and she has stumbled and tripped multiple times.     She had some right foot tingling and feels 'bugs' crawling down her legs.    Her right arm feels weak and she has noted some fasciculations.  Left arm slightly weak.    Opening a bottle is difficult.   Both hands have numbness.  Baclofen helps spasticity but it makes her sleepy.    Initially, she felt better on lamotrigine but now thinks it is not helping and reduced it and is tapering off as she felt it made her 'crazy'.      Gabapentin had not helped her much.     Percocet 10 mg every morning helps pain.       If she takes at night., she gets insomnia.    Bladder:   She  continues to have urinary frequency and incontinence.   She sees Dr. Kary Kos.   She takes intravaginal valium if she gets spasms there.    Vision/eyes:   She feels peripheral vision is reduced, 1st VF test sowed that but second one is better. .   She has had a right eye twitching x 6 months.   She does worse in bright lights and when she is tired at night.    Fatigue/sleep:  She having a lot of fatigue, despite sleeping 12-14 hours a day.    She is sleeping a full night plus a long nap daily. Fatigue is both physical and mental.    She sleeps poorly at night.   She has trouble getting comfortable at night.    She gets cramps in her legs at night.    Sometimes she wakes herself up when she is moving her arms like she is doing some task.  She felt sick on Adderall XR in the past.     Mood/cognition:   She has depression and anxiety. She gets agoraphobia when shopping.     She takes Effexor 37.5 with some benefit with mood.   She notes a lot of difficulty with cognition and attention.  Specifically, organizing is difficult.   She is very forgetful and distractible.   She has trouble following through with tasks  Migraine:  She is having migraines 2-4 times monthly.  She has a lot more chance of migraine when a weather front comes through.   Imitrex helps but incompletely   MS/spine history:   In 2001, she was noted to have left hearing loss and had an MRI of the brain.   She did not have numbness or weakness a that time.  She was told she likely had MS.   She saw a neurologist.  She never had an LP.   She reports she was told she had MS but was never started on any medication.   Starting about 5-6 years ago, she was noting more difficulty with gait and also noted some memory issues.   She was also having bladder issues with urgency and bladder incontinence x 5 years.      However, she had no health insurance at that time and did not follow up with neurology.   She saw Dr. Tomi Likens last year.   He ordered an MRI  of the brian and cervical spine.   She had one enhancing lesion on the spine and one additional lesion.  An LP was ordered but she opted not to do the test.   She was started on tecfidera.   She has had some itching and flushing, especially if she does not take after food.     She also was found to have severe spinal stenosis and myelopathy.   She was sent to Dr. Arnoldo Morale of Neurosurgery.   She underwent  ACDF from C3-C7 06/2014.    She reports she is having more trouble with her arms since the surgery.   The worse pain is in the 2nd and 3rd fingers but she has altered sensation with some pain in the other fingers as well.   She also has right > left lower neck pain.  Bladder is about the same (maybe slightly better) as it was before surgery.      I have personally reviewed the MRI of the brain dated 01/12/2015 and the MRIs of the cervical spine dated 02/01/2015 and 05/22/2014.   The MRI of the brain shows white matter foci predominantly in the deep white matter but also in the periventricular and subcortical white matter of both hemispheres. The brainstem appears normal. There is no significant atrophy. The MRI of the cervical spine from 2015 showed severe spinal stenosis at C5-C6 and milder spinal stenosis at C6-C7 and C4-C5. There is an enhancing focus within the spinal cord adjacent to C5-C6 and she has another hyperintense focus at C3-C4. On the second MRI, done without contrast, she is status post C3-C7 ACDF.      REVIEW OF SYSTEMS: Constitutional: No fevers, chills, sweats, or change in appetite Eyes: as above Ear, nose and throat: No hearing loss, ear pain, nasal congestion, sore throat Cardiovascular: No chest pain, palpitations Respiratory: No shortness of breath at rest or with exertion.   No wheezes GastrointestinaI: No nausea, vomiting, diarrhea, abdominal pain, fecal incontinence Genitourinary: see above Musculoskeletal: Some neck pain, back pain Integumentary: No rash, pruritus, skin  lesions Neurological: as above Psychiatric: Notes depression and anxiety Endocrine: No palpitations, diaphoresis, change in appetite, change in weigh or increased thirst Hematologic/Lymphatic: No anemia, purpura, petechiae. Allergic/Immunologic: No itchy/runny eyes, nasal congestion, recent allergic reactions, rashes  ALLERGIES: Allergies  Allergen Reactions  . Codeine  Makes her "busy", itchy  . Meperidine Nausea And Vomiting  . Meperidine Hcl Nausea And Vomiting    SEVERE N/V  . Morphine Other (See Comments)    "SKIN CRAWLS"    HOME MEDICATIONS:  Current outpatient prescriptions:  .  ALPRAZolam (XANAX) 0.5 MG tablet, TAKE ONE TABLET BY MOUTH TWICE DAILY AS NEEDED, Disp: 60 tablet, Rfl: 3 .  baclofen (LIORESAL) 10 MG tablet, Take 1 tablet (10 mg total) by mouth 2 (two) times daily as needed for muscle spasms., Disp: 60 tablet, Rfl: 5 .  Cholecalciferol (VITAMIN D3) 50000 UNITS CAPS, , Disp: , Rfl:  .  Cyanocobalamin (VITAMIN B-12) 2500 MCG SUBL, Place 2,500 mcg under the tongue daily., Disp: , Rfl:  .  diazepam (VALIUM) 5 MG tablet, Take 1 tablet (5 mg total) by mouth every 8 (eight) hours as needed., Disp: 90 tablet, Rfl: 3 .  Dimethyl Fumarate (TECFIDERA) 240 MG CPDR, Take 1 capsule by mouth 2 (two) times daily., Disp: , Rfl:  .  etodolac (LODINE) 400 MG tablet, Take 400 mg by mouth 2 (two) times daily., Disp: , Rfl:  .  hydroxychloroquine (PLAQUENIL) 200 MG tablet, Take 200 mg by mouth daily., Disp: , Rfl:  .  lamoTRIgine (LAMICTAL) 200 MG tablet, Take 1.5 tablets (300 mg total) by mouth 2 (two) times daily., Disp: 90 tablet, Rfl: 11 .  Meth-Hyo-M Bl-Na Phos-Ph Sal (URIBEL) 118 MG CAPS, Take 1 capsule by mouth 3 (three) times daily as needed., Disp: , Rfl:  .  naphazoline (NAPHCON) 0.1 % ophthalmic solution, Place 1 drop into both eyes 4 (four) times daily as needed for irritation., Disp: , Rfl:  .  naproxen (NAPROSYN) 500 MG tablet, TAKE ONE TABLET BY MOUTH WITH FIRST DOSE  OF IMITREX, Disp: 30 tablet, Rfl: 2 .  OVER THE COUNTER MEDICATION, Take 250 mg by mouth daily., Disp: , Rfl:  .  oxyCODONE-acetaminophen (PERCOCET) 10-325 MG per tablet, Take 1 tablet by mouth every 4 (four) hours as needed for pain., Disp: , Rfl:  .  SUMAtriptan (IMITREX) 100 MG tablet, Take 1 tablet (100 mg total) by mouth every 2 (two) hours as needed for migraine. May repeat in 2 hours if headache persists or recurs., Disp: 10 tablet, Rfl: 6 .  venlafaxine XR (EFFEXOR-XR) 75 MG 24 hr capsule, Take 1 capsule (75 mg total) by mouth daily with breakfast., Disp: 30 capsule, Rfl: 5 .  Vitamin D, Ergocalciferol, (DRISDOL) 50000 UNITS CAPS capsule, Take 1 capsule (50,000 Units total) by mouth every 7 (seven) days., Disp: 4 capsule, Rfl: 2  PAST MEDICAL HISTORY: Past Medical History  Diagnosis Date  . Arthritis   . Depression   . Migraines   . History of pneumothorax     1988-- SPONTANEOUS--  RESOLVED W/ CHEST TUBE  . Lesion of bladder   . H/O cold sores   . Borderline diabetes   . Frequency of urination   . Urgency of urination   . Nocturia   . Seasonal allergies   . Anal fissure   . Anxiety   . IC (interstitial cystitis)   . Pre-eclampsia   . MS (multiple sclerosis) ([place])     MRI showed plague on her brain  . RSD (reflex sympathetic dystrophy)     PAST SURGICAL HISTORY: Past Surgical History  Procedure Laterality Date  . Tonsillectomy and adenoidectomy  1972  . D & c hysteroscopy with polypectomy  12-02-2003  . Cesarean section  1994  . Dx laparoscopy/  fulgeration endometriosis/  appendectomy  1985  . Cystoscopy with hydrodistension and biopsy Bilateral 02/15/2014    Procedure: CYSTOSCOPY  BILATERAL RETROGRADE PYLOGRAM, HYDRODISTENSION, INSTILATION OF MARCAINE AND PYRIDIUM;  Surgeon: Ardis Hughs, MD;  Location: [facility];  Service: Urology;  Laterality: Bilateral;  . Appendectomy    . Dilation and curettage of uterus    . Anterior cervical  decompression/discectomy fusion 4 levels N/A 07/03/2014    Procedure: ANTERIOR CERVICAL DECOMPRESSION/DISCECTOMY FUSION 4 LEVELS;  Surgeon: Newman Pies, MD;  Location: [facility];  Service: Neurosurgery;  Laterality: N/A;  C3-4 C4-5 C5-6 C6-7 Anterior cervical decompression/diskectomy/fusion/interbody prosthesis/plate    FAMILY HISTORY: Family History  Problem Relation Age of Onset  . Hypertension Mother   . Diabetes Father   . Prostate cancer Father   . Lung cancer Father   . Cancer Father     cheek of mouth  . Diabetes Paternal Grandmother   . Diabetes Paternal Grandfather   . Diabetes Paternal Uncle     x 4  . Diabetes Paternal Aunt   . Colon cancer Maternal Uncle   . Esophageal cancer Neg Hx   . Rectal cancer Neg Hx   . Stomach cancer Neg Hx     SOCIAL HISTORY:  Social History   Social History  . Marital Status: Single    Spouse Name: N/A  . Number of Children: 1  . Years of Education: N/A   Occupational History  . self-employed house cleaning    Social History Main Topics  . Smoking status: Former Smoker -- 0.50 packs/day for 20 years    Types: Cigarettes    Quit date: 01/17/2011  . Smokeless tobacco: Never Used  . Alcohol Use: No  . Drug Use: No  . Sexual Activity: No   Other Topics Concern  . Not on file   Social History Narrative     PHYSICAL EXAM  Filed Vitals:   11/04/15 1123  BP: 120/84  Pulse: 86  Resp: 16  Height: 5' 9.75" (1.772 m)  Weight: 120 lb 6.4 oz (54.613 kg)    Body mass index is 17.39 kg/(m^2).   General: The patient is a thin woman in no acute distress  Neck: The neck is post-op, no carotid bruits are noted.  The neck is nontender.   Skin: Extremities are without significant edema.  Musculoskeletal:  Back is nontender  Neurologic Exam  Mental status: The patient is alert and oriented x 3 at the time of the examination. The patient has apparent normal recent and remote memory, with an apparently normal attention  span and concentration ability.   Speech is normal.  Cranial nerves: Extraocular movements are full. Pupils are equal, round, and reactive to light and accomodation.  Visual fields are full.  Facial symmetry is present. There is good facial sensation to soft touch bilaterally.Facial strength is normal.  Trapezius and sternocleidomastoid strength is normal. No dysarthria is noted.  The tongue is midline, and the patient has symmetric elevation of the soft palate. No obvious hearing deficits are noted.  Motor:  Muscle bulk is normal.   Tone is normal. Strength is  4+ / 5 in the right triceps and 4 minus/5 on all intrinsic hand muscles on the right 4/5 on all muscles on the left. APB is 5/5. and 5/5 in legs.   Sensory: Sensory testing shows decreased sensation to touch and vibration in left C6 and C7 dermatomes of hand and hyperthenar eminence on the right. Also mild decreased vibration in left  leg, relative to right  Coordination: Cerebellar testing reveals good finger-nose-finger and ok heel-to-shin bilaterally (slower on right but accurate).  Gait and station: Station is normal.   Gait is normal. Tandem gait is wide. Romberg is negative.   Reflexes: Deep tendon reflexes are increased bilaterally with spread at the knees but no clonus.       DIAGNOSTIC DATA (LABS, IMAGING, TESTING) - I reviewed patient records, labs, notes, testing and imaging myself where available.  Lab Results  Component Value Date   WBC 6.9 05/15/2015   HGB 10.9* 05/15/2015   HCT 33.5* 05/15/2015   MCV 83.2 05/15/2015   PLT 219.0 05/15/2015      Component Value Date/Time   NA 139 05/15/2015 0842   K 3.9 05/15/2015 0842   CL 103 05/15/2015 0842   CO2 30 05/15/2015 0842   GLUCOSE 114* 05/15/2015 0842   BUN 15 05/15/2015 0842   CREATININE 0.63 05/15/2015 0842   CREATININE 0.55 12/26/2014 1042   CALCIUM 9.2 05/15/2015 0842   PROT 7.0 05/15/2015 0842   ALBUMIN 4.2 05/15/2015 0842   AST 12 05/15/2015 0842   ALT  10 05/15/2015 0842   ALKPHOS 66 05/15/2015 0842   BILITOT 0.4 05/15/2015 0842   GFRNONAA >89 12/26/2014 1042   GFRNONAA >90 06/27/2014 1600   GFRAA >89 12/26/2014 1042   GFRAA >90 06/27/2014 1600   Lab Results  Component Value Date   CHOL 161 05/15/2015   HDL 62.60 05/15/2015   LDLCALC 86 05/15/2015   TRIG 61.0 05/15/2015   CHOLHDL 3 05/15/2015   Lab Results  Component Value Date   HGBA1C 5.9 05/15/2015   Lab Results  Component Value Date   VITAMINB12 154* 03/28/2015   Lab Results  Component Value Date   TSH 0.87 05/15/2015       ASSESSMENT AND PLAN  MS (multiple sclerosis) (HCC)  Numbness - Plan: NCV with EMG(electromyography)  Cervical spondylosis with myelopathy and radiculopathy - Plan: NCV with EMG(electromyography)  Ulnar neuropathy of both upper extremities - Plan: NCV with EMG(electromyography)  Other fatigue  Urge incontinence  Depression with anxiety  Intractable migraine without aura and without status migrainosus   1.   She will continue Tecfidera. We helped her get back on track to get her deliveries 2.   Provigil for hypersomnia and MS fatigue 3.   She is having more issues with her right > left arm and hand. Exam is consistent with an ulnar neuropathy and of course she has possible stability of her spinal cord issues. We will check an NCV/EMG of both arms 4.  Stay active.   Continue other medications 5.   She will return to see me in 4 months or sooner if she has new or worsening neurologic symptoms.       Richard A. Felecia Shelling, MD, PhD [id number], [id number] AM Certified in Neurology, Clinical Neurophysiology, Sleep Medicine, Pain Medicine and Neuroimaging  [facility] [address] [place], [place] [address] [phone]

## 2015-11-11 ENCOUNTER — Telehealth: Payer: Self-pay | Admitting: Neurology

## 2015-11-11 MED ORDER — DIMETHYL FUMARATE 240 MG PO CPDR: 1.0000 | DELAYED_RELEASE_CAPSULE | Freq: Two times a day (BID) | ORAL | Status: DC

## 2015-11-11 NOTE — Telephone Encounter (Signed)
[facility] called requesting refill on Dimethyl Fumarate (TECFIDERA) 240 MG CPDR. Pt is currently out of medication. May call [phone] press 1 for pharmacist. Fax: [phone] to [facility].

## 2015-11-11 NOTE — Addendum Note (Signed)
Addended by: France Ravens I on: 11/11/2015 09:58 AM   Modules accepted: Orders

## 2015-11-11 NOTE — Telephone Encounter (Signed)
Tecfidera r/f to [facility] per request/fim

## 2015-11-17 ENCOUNTER — Ambulatory Visit: Payer: 59 | Admitting: Neurology

## 2015-11-25 ENCOUNTER — Ambulatory Visit (INDEPENDENT_AMBULATORY_CARE_PROVIDER_SITE_OTHER): Payer: Self-pay | Admitting: Neurology

## 2015-11-25 ENCOUNTER — Ambulatory Visit (INDEPENDENT_AMBULATORY_CARE_PROVIDER_SITE_OTHER): Payer: BLUE CROSS/BLUE SHIELD | Admitting: Neurology

## 2015-11-25 ENCOUNTER — Other Ambulatory Visit: Payer: Self-pay | Admitting: Neurology

## 2015-11-25 DIAGNOSIS — Z0289 Encounter for other administrative examinations: Secondary | ICD-10-CM

## 2015-11-25 DIAGNOSIS — M4722 Other spondylosis with radiculopathy, cervical region: Principal | ICD-10-CM

## 2015-11-25 DIAGNOSIS — G5622 Lesion of ulnar nerve, left upper limb: Secondary | ICD-10-CM

## 2015-11-25 DIAGNOSIS — G5621 Lesion of ulnar nerve, right upper limb: Secondary | ICD-10-CM

## 2015-11-25 DIAGNOSIS — M4712 Other spondylosis with myelopathy, cervical region: Secondary | ICD-10-CM

## 2015-11-25 DIAGNOSIS — G5623 Lesion of ulnar nerve, bilateral upper limbs: Secondary | ICD-10-CM

## 2015-11-25 DIAGNOSIS — R200 Anesthesia of skin: Secondary | ICD-10-CM

## 2015-11-25 MED ORDER — GABAPENTIN 300 MG PO CAPS: 300.0000 mg | ORAL_CAPSULE | Freq: Three times a day (TID) | ORAL | Status: DC

## 2015-11-25 NOTE — Progress Notes (Signed)
   STUDY DATE:  11/25/2015 PATIENT NAME: Stacy Logan DOB: 1966/07/28 MRN: [id number]  HISTORY:  She is a 50 year old woman with multiple sclerosis who has a history of a C4-C7 ACDF reporting numbness in both hands. On exam, she has a Tinel's sign over both elbows, worse on the right  NERVE CONDUCTION STUDIES:  Bilateral median motor and ulnar sensory responses were normal.    Both ulnar or sensory responses were minimally slowed across the wrist with normal amplitudes. Both median sensory responses were normal  EMG STUDIES:  Needle EMG of selected muscles of the right arm was performed. Motor unit morphology and recruitment was normal in the deltoid, biceps and first dorsal interosseous muscles. There was an increased number of polyphasic motor units with neuropathic recruitment in the triceps and the extensors digitorum communis muscle.   There was no abnormal spontaneous activity in any of these muscles.  IMPRESSION:  This NCV/EMG shows the following: 1.   There were borderline ulnar neuropathies at the wrists though conduction across the elbow was normal. 2.   Chronic right C7 radiculopathy without active features   Richard A. Felecia Shelling, MD, PhD Certified in Neurology, [place] [facility], Sleep Medicine, Pain Medicine and Neuroimaging  [facility] [address], [place], [place] [address] [phone]

## 2015-12-02 ENCOUNTER — Telehealth: Payer: Self-pay | Admitting: Family Medicine

## 2015-12-02 ENCOUNTER — Encounter: Payer: Self-pay | Admitting: *Deleted

## 2015-12-02 NOTE — Telephone Encounter (Signed)
LVM inquiring if patient received flu shot  °

## 2016-01-03 ENCOUNTER — Encounter (HOSPITAL_COMMUNITY): Payer: Self-pay | Admitting: Emergency Medicine

## 2016-01-03 DIAGNOSIS — G35 Multiple sclerosis: Secondary | ICD-10-CM | POA: Insufficient documentation

## 2016-01-03 DIAGNOSIS — R000 Tachycardia, unspecified: Secondary | ICD-10-CM | POA: Insufficient documentation

## 2016-01-03 DIAGNOSIS — Z79899 Other long term (current) drug therapy: Secondary | ICD-10-CM | POA: Diagnosis not present

## 2016-01-03 DIAGNOSIS — R05 Cough: Secondary | ICD-10-CM | POA: Diagnosis not present

## 2016-01-03 DIAGNOSIS — M1990 Unspecified osteoarthritis, unspecified site: Secondary | ICD-10-CM | POA: Diagnosis not present

## 2016-01-03 DIAGNOSIS — R079 Chest pain, unspecified: Secondary | ICD-10-CM | POA: Insufficient documentation

## 2016-01-03 DIAGNOSIS — Z8709 Personal history of other diseases of the respiratory system: Secondary | ICD-10-CM | POA: Insufficient documentation

## 2016-01-03 DIAGNOSIS — Z87448 Personal history of other diseases of urinary system: Secondary | ICD-10-CM | POA: Diagnosis not present

## 2016-01-03 DIAGNOSIS — Z87891 Personal history of nicotine dependence: Secondary | ICD-10-CM | POA: Insufficient documentation

## 2016-01-03 DIAGNOSIS — G43909 Migraine, unspecified, not intractable, without status migrainosus: Secondary | ICD-10-CM | POA: Insufficient documentation

## 2016-01-03 DIAGNOSIS — F419 Anxiety disorder, unspecified: Secondary | ICD-10-CM | POA: Diagnosis not present

## 2016-01-03 DIAGNOSIS — R0602 Shortness of breath: Secondary | ICD-10-CM | POA: Insufficient documentation

## 2016-01-03 DIAGNOSIS — F329 Major depressive disorder, single episode, unspecified: Secondary | ICD-10-CM | POA: Insufficient documentation

## 2016-01-03 NOTE — ED Notes (Signed)
Pt. woke up this evening with left chest pain and mild SOB , denies nausea or diaphoresis , pain increases with movement and deep inspirations . Denies fever or chills.

## 2016-01-04 ENCOUNTER — Emergency Department (HOSPITAL_COMMUNITY): Payer: BLUE CROSS/BLUE SHIELD

## 2016-01-04 ENCOUNTER — Emergency Department (HOSPITAL_COMMUNITY)
Admission: EM | Admit: 2016-01-04 | Discharge: 2016-01-04 | Disposition: A | Discharge status: Home / Self Care | Payer: BLUE CROSS/BLUE SHIELD | Attending: Emergency Medicine | Admitting: Emergency Medicine

## 2016-01-04 DIAGNOSIS — R059 Cough, unspecified: Secondary | ICD-10-CM

## 2016-01-04 DIAGNOSIS — R05 Cough: Secondary | ICD-10-CM

## 2016-01-04 LAB — BASIC METABOLIC PANEL
Anion gap: 13 (ref 5–15)
BUN: 8 mg/dL (ref 6–20)
CO2: 23 mmol/L (ref 22–32)
Calcium: 9.4 mg/dL (ref 8.9–10.3)
Chloride: 100 mmol/L — ABNORMAL LOW (ref 101–111)
Creatinine, Ser: 0.64 mg/dL (ref 0.44–1.00)
GLUCOSE: 113 mg/dL — AB (ref 65–99)
POTASSIUM: 3.7 mmol/L (ref 3.5–5.1)
Sodium: 136 mmol/L (ref 135–145)

## 2016-01-04 LAB — CBC
HCT: 37.1 % (ref 36.0–46.0)
Hemoglobin: 12.2 g/dL (ref 12.0–15.0)
MCH: 28 pg (ref 26.0–34.0)
MCHC: 32.9 g/dL (ref 30.0–36.0)
MCV: 85.1 fL (ref 78.0–100.0)
PLATELETS: 270 10*3/uL (ref 150–400)
RBC: 4.36 MIL/uL (ref 3.87–5.11)
RDW: 18 % — AB (ref 11.5–15.5)
WBC: 10.5 10*3/uL (ref 4.0–10.5)

## 2016-01-04 LAB — D-DIMER, QUANTITATIVE: D-Dimer, Quant: 0.47 ug/mL-FEU (ref 0.00–0.50)

## 2016-01-04 MED ORDER — BENZONATATE 100 MG PO CAPS
200.0000 mg | ORAL_CAPSULE | Freq: Once | ORAL | Status: AC
  Administered 2016-01-04: 200 mg via ORAL
  Filled 2016-01-04: qty 2

## 2016-01-04 MED ORDER — KETOROLAC TROMETHAMINE 30 MG/ML IJ SOLN
30.0000 mg | Freq: Once | INTRAMUSCULAR | Status: AC
  Administered 2016-01-04: 30 mg via INTRAVENOUS
  Filled 2016-01-04: qty 1

## 2016-01-04 MED ORDER — SODIUM CHLORIDE 0.9 % IV BOLUS (SEPSIS)
1000.0000 mL | Freq: Once | INTRAVENOUS | Status: AC
  Administered 2016-01-04: 1000 mL via INTRAVENOUS

## 2016-01-04 MED ORDER — IPRATROPIUM-ALBUTEROL 0.5-2.5 (3) MG/3ML IN SOLN
3.0000 mL | RESPIRATORY_TRACT | Status: DC
  Administered 2016-01-04: 3 mL via RESPIRATORY_TRACT
  Filled 2016-01-04: qty 3

## 2016-01-04 MED ORDER — BENZONATATE 100 MG PO CAPS: 100.0000 mg | ORAL_CAPSULE | Freq: Three times a day (TID) | ORAL | Status: DC | PRN

## 2016-01-04 NOTE — ED Provider Notes (Signed)
CSN: [id number]     Arrival date & time 01/03/16  2305 History   By signing my name below, I, Stacy Logan, attest that this documentation has been prepared under the direction and in the presence of Stacy Balls, MD.  Electronically Signed: Forrestine Logan, ED Scribe. 01/04/2016. 12:50 AM.   Chief Complaint  Patient presents with  . Chest Pain   The history is provided by the patient. No language interpreter was used.    HPI Comments: Stacy Logan is a 50 y.o. female who presents to the Emergency Department complaining of constant, ongoing L sided chest pain onset 10:00 PM this evening that woke her from sleep. Pain is described as sharp. Discomfort is exacerbated with deep breathing. No alleviating factors. She also reports mild shortness of breath and a productive cough consisting of a "flavored" clear mucous. No OTC medications or home remedies attempted prior to arrival. No recent fever, chills, nausea, or vomiting. [name] has a history of a collapsed lung and states symptoms feel similar. She denies any recent surgeries. No recent long distance travel.  PCP: Annye Asa, MD    Past Medical History  Diagnosis Date  . Arthritis   . Depression   . Migraines   . History of pneumothorax     1988-- SPONTANEOUS--  RESOLVED W/ CHEST TUBE  . Lesion of bladder   . H/O cold sores   . Borderline diabetes   . Frequency of urination   . Urgency of urination   . Nocturia   . Seasonal allergies   . Anal fissure   . Anxiety   . IC (interstitial cystitis)   . Pre-eclampsia   . MS (multiple sclerosis) ([place])     MRI showed plague on her brain  . RSD (reflex sympathetic dystrophy)    Past Surgical History  Procedure Laterality Date  . Tonsillectomy and adenoidectomy  1972  . D & c hysteroscopy with polypectomy  12-02-2003  . Cesarean section  1994  . Dx laparoscopy/  fulgeration endometriosis/  appendectomy  1985  . Cystoscopy with hydrodistension and biopsy Bilateral 02/15/2014     Procedure: CYSTOSCOPY  BILATERAL RETROGRADE PYLOGRAM, HYDRODISTENSION, INSTILATION OF MARCAINE AND PYRIDIUM;  Surgeon: Ardis Hughs, MD;  Location: [facility];  Service: Urology;  Laterality: Bilateral;  . Appendectomy    . Dilation and curettage of uterus    . Anterior cervical decompression/discectomy fusion 4 levels N/A 07/03/2014    Procedure: ANTERIOR CERVICAL DECOMPRESSION/DISCECTOMY FUSION 4 LEVELS;  Surgeon: Newman Pies, MD;  Location: [facility];  Service: Neurosurgery;  Laterality: N/A;  C3-4 C4-5 C5-6 C6-7 Anterior cervical decompression/diskectomy/fusion/interbody prosthesis/plate   Family History  Problem Relation Age of Onset  . Hypertension Mother   . Diabetes Father   . Prostate cancer Father   . Lung cancer Father   . Cancer Father     cheek of mouth  . Diabetes Paternal Grandmother   . Diabetes Paternal Grandfather   . Diabetes Paternal Uncle     x 4  . Diabetes Paternal Aunt   . Colon cancer Maternal Uncle   . Esophageal cancer Neg Hx   . Rectal cancer Neg Hx   . Stomach cancer Neg Hx    Social History  Substance Use Topics  . Smoking status: Former Smoker -- 0.00 packs/day for 0 years    Types: Cigarettes    Quit date: 01/17/2011  . Smokeless tobacco: Never Used  . Alcohol Use: No   OB History  No data available     Review of Systems  A complete 10 system review of systems was obtained and all systems are negative except as noted in the HPI and PMH.    Allergies  Codeine; Meperidine; Meperidine hcl; and Morphine  Home Medications   Prior to Admission medications   Medication Sig Start Date End Date Taking? Authorizing Provider  ALPRAZolam Duanne Moron) 0.5 MG tablet TAKE ONE TABLET BY MOUTH TWICE DAILY AS NEEDED 05/30/15   Midge Minium, MD  baclofen (LIORESAL) 10 MG tablet Take 1 tablet (10 mg total) by mouth 2 (two) times daily as needed for muscle spasms. 04/15/15   Britt Bottom, MD  Cholecalciferol (VITAMIN D3)  50000 UNITS CAPS  03/28/15   Historical Provider, MD  Cyanocobalamin (VITAMIN B-12) 2500 MCG SUBL Place 2,500 mcg under the tongue daily.    Historical Provider, MD  diazepam (VALIUM) 5 MG tablet Take 1 tablet (5 mg total) by mouth every 8 (eight) hours as needed. 07/16/15   Britt Bottom, MD  Dimethyl Fumarate (TECFIDERA) 240 MG CPDR Take 1 capsule (240 mg total) by mouth 2 (two) times daily. 11/11/15   Britt Bottom, MD  etodolac (LODINE) 400 MG tablet Take 400 mg by mouth 2 (two) times daily.    Historical Provider, MD  gabapentin (NEURONTIN) 300 MG capsule Take 1 capsule (300 mg total) by mouth 3 (three) times daily. 11/25/15   Britt Bottom, MD  Meth-Hyo-M Barnett Hatter Phos-Ph Sal (URIBEL) 118 MG CAPS Take 1 capsule by mouth 3 (three) times daily as needed.    Historical Provider, MD  modafinil (PROVIGIL) 200 MG tablet Take 1 tablet (200 mg total) by mouth daily. 11/04/15   Britt Bottom, MD  naphazoline (NAPHCON) 0.1 % ophthalmic solution Place 1 drop into both eyes 4 (four) times daily as needed for irritation.    Historical Provider, MD  naproxen (NAPROSYN) 500 MG tablet TAKE ONE TABLET BY MOUTH WITH FIRST DOSE OF IMITREX 09/18/15   Pieter Partridge, DO  OVER THE COUNTER MEDICATION Take 250 mg by mouth daily.    Historical Provider, MD  oxyCODONE-acetaminophen (PERCOCET) 10-325 MG per tablet Take 1 tablet by mouth every 4 (four) hours as needed for pain.    Historical Provider, MD  SUMAtriptan (IMITREX) 100 MG tablet Take 1 tablet (100 mg total) by mouth every 2 (two) hours as needed for migraine. May repeat in 2 hours if headache persists or recurs. 05/30/15   Pieter Partridge, DO  venlafaxine XR (EFFEXOR-XR) 75 MG 24 hr capsule Take 1 capsule (75 mg total) by mouth daily with breakfast. 07/16/15   Britt Bottom, MD  Vitamin D, Ergocalciferol, (DRISDOL) 50000 UNITS CAPS capsule Take 1 capsule (50,000 Units total) by mouth every 7 (seven) days. 03/28/15   Midge Minium, MD   Triage Vitals: BP 114/80  mmHg  Pulse 111  Temp(Src) 99 F (37.2 C) (Oral)  Resp 24  Ht 5\' 9"  (1.753 m)  Wt 114 lb (51.71 kg)  BMI 16.83 kg/m2  SpO2 97%   Physical Exam  Constitutional: She is oriented to person, place, and time. She appears well-developed and well-nourished. No distress.  HENT:  Head: Normocephalic and atraumatic.  Nose: Nose normal.  Mouth/Throat: Oropharynx is clear and moist. No oropharyngeal exudate.  Eyes: Conjunctivae and EOM are normal. Pupils are equal, round, and reactive to light. No scleral icterus.  Neck: Normal range of motion. Neck supple. No JVD present. No tracheal deviation present.  No thyromegaly present.  Cardiovascular: Regular rhythm and normal heart sounds.  Tachycardia present.  Exam reveals no gallop and no friction rub.   No murmur heard. Pulmonary/Chest: Effort normal and breath sounds normal. No respiratory distress. She has no wheezes. She exhibits no tenderness.  Coughing on exam   Abdominal: Soft. Bowel sounds are normal. She exhibits no distension and no mass. There is no tenderness. There is no rebound and no guarding.  Musculoskeletal: Normal range of motion. She exhibits no edema or tenderness.  Lymphadenopathy:    She has no cervical adenopathy.  Neurological: She is alert and oriented to person, place, and time. No cranial nerve deficit. She exhibits normal muscle tone.  Skin: Skin is warm and dry. No rash noted. No erythema. No pallor.  Nursing note and vitals reviewed.   ED Course  Procedures (including critical care time)  DIAGNOSTIC STUDIES: Oxygen Saturation is 97% on RA, Normal by my interpretation.    COORDINATION OF CARE: 12:28 AM-Discussed treatment plan with pt at bedside and pt agreed to plan.     Labs Review Labs Reviewed  BASIC METABOLIC PANEL - Abnormal; Notable for the following:    Chloride 100 (*)    Glucose, Bld 113 (*)    All other components within normal limits  CBC - Abnormal; Notable for the following:    RDW 18.0 (*)     All other components within normal limits  D-DIMER, QUANTITATIVE (NOT AT [facility])  Randolm Idol, ED    Imaging Review Dg Chest 2 View  01/04/2016  CLINICAL DATA:  Acute onset of stabbing chest pain and shortness of breath. Initial encounter. EXAM: CHEST  2 VIEW COMPARISON:  Chest radiograph performed 06/27/2014 FINDINGS: The lungs are hyperexpanded, with flattening of the hemidiaphragms, compatible with COPD. There is no evidence of focal opacification, pleural effusion or pneumothorax. The heart is normal in size; the mediastinal contour is within normal limits. No acute osseous abnormalities are seen. Bilateral nipple shadows are noted. Cervical spinal fusion hardware is partially imaged. IMPRESSION: Findings of COPD.  Lungs remain otherwise clear. Electronically Signed   By: Garald Balding M.D.   On: 01/04/2016 01:34   I have personally reviewed and evaluated these images and lab results as part of my medical decision-making.   EKG Interpretation   Date/Time:  Saturday January 03 2016 23:12:26 EDT Ventricular Rate:  121 PR Interval:  140 QRS Duration: 66 QT Interval:  320 QTC Calculation: [id number] R Axis:   88 Text Interpretation:  Sinus tachycardia Possible Left atrial enlargement  Left ventricular hypertrophy with repolarization abnormality Abnormal ECG  tachycardia now present Confirmed by Glynn Octave [phone]) on  01/04/2016 12:21:29 AM      MDM   Final diagnoses:  None    Patient presents to the ED for chest pain associated with a cough.  She was given duoneb, toradol, and tessalon pearles for treatment.  CXR is negative as well as blood work.    2:57 AM Upon rpeat evaluation, patient states she feels better.  Will DC with tessalon pearles to take as needed.  She appears well and in NAD.  VS remain within her normal limits and she is safe for DC.   I personally performed the services described in this documentation, which was scribed in my presence. The recorded  information has been reviewed and is accurate.     Stacy Balls, MD 01/04/16 [phone]

## 2016-01-04 NOTE — Discharge Instructions (Signed)
Cough, Adult Stacy Logan, see a primary care doctor within 3 days for close follow up.  Take prescribed medication as needed for your cough.  Come back to the ED immediately for any worsening. Thank you. A cough helps to clear your throat and lungs. A cough may last only 2-3 weeks (acute), or it may last longer than 8 weeks (chronic). Many different things can cause a cough. A cough may be a sign of an illness or another medical condition. HOME CARE  Pay attention to any changes in your cough.  Take medicines only as told by your doctor.  If you were prescribed an antibiotic medicine, take it as told by your doctor. Do not stop taking it even if you start to feel better.  Talk with your doctor before you try using a cough medicine.  Drink enough fluid to keep your pee (urine) clear or pale yellow.  If the air is dry, use a cold steam vaporizer or humidifier in your home.  Stay away from things that make you cough at work or at home.  If your cough is worse at night, try using extra pillows to raise your head up higher while you sleep.  Do not smoke, and try not to be around smoke. If you need help quitting, ask your doctor.  Do not have caffeine.  Do not drink alcohol.  Rest as needed. GET HELP IF:  You have new problems (symptoms).  You cough up yellow fluid (pus).  Your cough does not get better after 2-3 weeks, or your cough gets worse.  Medicine does not help your cough and you are not sleeping well.  You have pain that gets worse or pain that is not helped with medicine.  You have a fever.  You are losing weight and you do not know why.  You have night sweats. GET HELP RIGHT AWAY IF:  You cough up blood.  You have trouble breathing.  Your heartbeat is very fast.   This information is not intended to replace advice given to you by your health care provider. Make sure you discuss any questions you have with your health care provider.   Document Released:  05/20/2011 Document Revised: 05/28/2015 Document Reviewed: 11/13/2014 Elsevier Interactive Patient Education [employer].

## 2016-01-04 NOTE — ED Notes (Signed)
This RN, ED Tech, and Xray personnel all attempted to find patient without success.

## 2016-01-04 NOTE — ED Notes (Signed)
Patient resting more comfortably at this time.

## 2016-01-04 NOTE — ED Notes (Signed)
Pt left at this time with all belongings. Refused wheelchair. 

## 2016-01-09 ENCOUNTER — Ambulatory Visit (INDEPENDENT_AMBULATORY_CARE_PROVIDER_SITE_OTHER): Payer: BLUE CROSS/BLUE SHIELD | Admitting: Physician Assistant

## 2016-01-09 ENCOUNTER — Other Ambulatory Visit: Payer: Self-pay | Admitting: Neurology

## 2016-01-09 ENCOUNTER — Other Ambulatory Visit: Payer: Self-pay

## 2016-01-09 ENCOUNTER — Encounter: Payer: Self-pay | Admitting: Physician Assistant

## 2016-01-09 VITALS — BP 110/80 | HR 99 | Temp 98.2°F | Ht 69.0 in | Wt 113.4 lb

## 2016-01-09 DIAGNOSIS — J00 Acute nasopharyngitis [common cold]: Secondary | ICD-10-CM

## 2016-01-09 DIAGNOSIS — B9689 Other specified bacterial agents as the cause of diseases classified elsewhere: Secondary | ICD-10-CM

## 2016-01-09 DIAGNOSIS — R52 Pain, unspecified: Secondary | ICD-10-CM

## 2016-01-09 DIAGNOSIS — J208 Acute bronchitis due to other specified organisms: Principal | ICD-10-CM

## 2016-01-09 MED ORDER — ALBUTEROL SULFATE HFA 108 (90 BASE) MCG/ACT IN AERS: 2.0000 | INHALATION_SPRAY | Freq: Four times a day (QID) | RESPIRATORY_TRACT | Status: DC | PRN

## 2016-01-09 MED ORDER — SUMATRIPTAN SUCCINATE 100 MG PO TABS: 100.0000 mg | ORAL_TABLET | ORAL | Status: DC | PRN

## 2016-01-09 MED ORDER — AZITHROMYCIN 250 MG PO TABS: ORAL_TABLET | ORAL | Status: DC

## 2016-01-09 NOTE — Progress Notes (Signed)
Pre visit review using our clinic tool,if applicable. No additional management support is needed unless otherwise documented below in the visit note.  

## 2016-01-09 NOTE — Progress Notes (Signed)
Patient presents to clinic today c/o 1+ weeks of chest congestion and productive cough. Denies fever, chills, SOB or wheezing. Does endorses some sternal tenderness with coughing. Denies trauma or injury. Patient denies other chest pain, palpitations, lightheadedness, dizziness, vision changes or frequent headaches.   Past Medical History  Diagnosis Date  . Arthritis   . Depression   . Migraines   . History of pneumothorax     1988-- SPONTANEOUS--  RESOLVED W/ CHEST TUBE  . Lesion of bladder   . H/O cold sores   . Borderline diabetes   . Frequency of urination   . Urgency of urination   . Nocturia   . Seasonal allergies   . Anal fissure   . Anxiety   . IC (interstitial cystitis)   . Pre-eclampsia   . MS (multiple sclerosis) ([place])     MRI showed plague on her brain  . RSD (reflex sympathetic dystrophy)     Current Outpatient Prescriptions on File Prior to Visit  Medication Sig Dispense Refill  . amitriptyline (ELAVIL) 25 MG tablet Take 25-50 mg by mouth at bedtime.    . benzonatate (TESSALON) 100 MG capsule Take 1 capsule (100 mg total) by mouth 3 (three) times daily as needed for cough. 12 capsule 0  . diazepam (VALIUM) 5 MG tablet Take 1 tablet (5 mg total) by mouth every 8 (eight) hours as needed. (Patient taking differently: Take 5 mg by mouth every 8 (eight) hours as needed for muscle spasms. ) 90 tablet 3  . Dimethyl Fumarate (TECFIDERA) 240 MG CPDR Take 1 capsule (240 mg total) by mouth 2 (two) times daily. 180 capsule 3  . ferrous sulfate 325 (65 FE) MG tablet Take 325 mg by mouth at bedtime.    . gabapentin (NEURONTIN) 300 MG capsule Take 1 capsule (300 mg total) by mouth 3 (three) times daily. 90 capsule 11  . hydroxychloroquine (PLAQUENIL) 200 MG tablet Take 200 mg by mouth at bedtime.     . Meth-Hyo-M Bl-Na Phos-Ph Sal (URIBEL) 118 MG CAPS Take 1 capsule by mouth 3 (three) times daily as needed (PAIN/UTI).     . naphazoline (NAPHCON) 0.1 % ophthalmic solution  Place 1 drop into both eyes 4 (four) times daily as needed for irritation.    . naproxen (NAPROSYN) 500 MG tablet TAKE ONE TABLET BY MOUTH WITH FIRST DOSE OF IMITREX (Patient taking differently: TAKE ONE TABLET BY MOUTH WITH FIRST DOSE OF IMITREX AS NEEDED FOR PAIN) 30 tablet 2  . oxyCODONE-acetaminophen (PERCOCET) 10-325 MG per tablet Take 1 tablet by mouth every 4 (four) hours as needed for pain.    . SUMAtriptan (IMITREX) 100 MG tablet Take 1 tablet (100 mg total) by mouth every 2 (two) hours as needed for migraine. May repeat in 2 hours if headache persists or recurs. 10 tablet 6  . venlafaxine XR (EFFEXOR-XR) 75 MG 24 hr capsule Take 1 capsule (75 mg total) by mouth daily with breakfast. 30 capsule 5  . baclofen (LIORESAL) 10 MG tablet Take 1 tablet (10 mg total) by mouth 2 (two) times daily as needed for muscle spasms. 60 tablet 5   No current facility-administered medications on file prior to visit.    Allergies  Allergen Reactions  . Codeine     Makes her "busy", itchy  . Lamictal [Lamotrigine] Other (See Comments)    Mood changes   . Meperidine Nausea And Vomiting  . Meperidine Hcl Nausea And Vomiting    SEVERE N/V  .  Morphine Other (See Comments)    "SKIN CRAWLS"    Family History  Problem Relation Age of Onset  . Hypertension Mother   . Diabetes Father   . Prostate cancer Father   . Lung cancer Father   . Cancer Father     cheek of mouth  . Diabetes Paternal Grandmother   . Diabetes Paternal Grandfather   . Diabetes Paternal Uncle     x 4  . Diabetes Paternal Aunt   . Colon cancer Maternal Uncle   . Esophageal cancer Neg Hx   . Rectal cancer Neg Hx   . Stomach cancer Neg Hx     Social History   Social History  . Marital Status: Single    Spouse Name: N/A  . Number of Children: 1  . Years of Education: N/A   Occupational History  . self-employed house cleaning    Social History Main Topics  . Smoking status: Former Smoker -- 0.00 packs/day for 0 years      Types: Cigarettes    Quit date: 01/17/2011  . Smokeless tobacco: Never Used  . Alcohol Use: No  . Drug Use: No  . Sexual Activity: No   Other Topics Concern  . None   Social History Narrative    Review of Systems - See HPI.  All other ROS are negative.  BP 110/80 mmHg  Pulse 99  Temp(Src) 98.2 F (36.8 C) (Oral)  Ht '5\' 9"'$  (1.753 m)  Wt 113 lb 6.4 oz (51.438 kg)  BMI 16.74 kg/m2  SpO2 98%  Physical Exam  Constitutional: She is well-developed, well-nourished, and in no distress.  HENT:  Head: Normocephalic and atraumatic.  Right Ear: External ear normal.  Left Ear: External ear normal.  Nose: Nose normal.  Mouth/Throat: Oropharynx is clear and moist. No oropharyngeal exudate.  TM within normal limits bilaterally.  Eyes: Conjunctivae are normal.  Neck: Neck supple.  Cardiovascular: Normal rate, regular rhythm, normal heart sounds and intact distal pulses.   Pulmonary/Chest: Effort normal and breath sounds normal. No respiratory distress. She has no wheezes. She has no rales.  + sternal tenderness with palpation. No rash or deformity noted.   Skin: Skin is warm and dry. No rash noted.  Psychiatric: Affect normal.  Vitals reviewed.   Recent Results (from the past 2160 hour(s))  Basic metabolic panel     Status: Abnormal   Collection Time: 01/03/16 11:14 PM  Result Value Ref Range   Sodium 136 135 - 145 mmol/L   Potassium 3.7 3.5 - 5.1 mmol/L   Chloride 100 (L) 101 - 111 mmol/L   CO2 23 22 - 32 mmol/L   Glucose, Bld 113 (H) 65 - 99 mg/dL   BUN 8 6 - 20 mg/dL   Creatinine, Ser 0.64 0.44 - 1.00 mg/dL   Calcium 9.4 8.9 - 10.3 mg/dL   GFR calc non Af Amer >60 >60 mL/min   GFR calc Af Amer >60 >60 mL/min    Comment: (NOTE) The eGFR has been calculated using the CKD EPI equation. This calculation has not been validated in all clinical situations. eGFR's persistently <60 mL/min signify possible Chronic Kidney Disease.    Anion gap 13 5 - 15  CBC     Status:  Abnormal   Collection Time: 01/03/16 11:14 PM  Result Value Ref Range   WBC 10.5 4.0 - 10.5 K/uL   RBC 4.36 3.87 - 5.11 MIL/uL   Hemoglobin 12.2 12.0 - 15.0 g/dL   HCT 37.1  36.0 - 46.0 %   MCV 85.1 78.0 - 100.0 fL   MCH 28.0 26.0 - 34.0 pg   MCHC 32.9 30.0 - 36.0 g/dL   RDW 18.0 (H) 11.5 - 15.5 %   Platelets 270 150 - 400 K/uL  D-dimer, quantitative (not at [facility])     Status: None   Collection Time: 01/03/16 11:14 PM  Result Value Ref Range   D-Dimer, Quant 0.47 0.00 - 0.50 ug/mL-FEU    Comment: (NOTE) At the manufacturer cut-off of 0.50 ug/mL FEU, this assay has been documented to exclude PE with a sensitivity and negative predictive value of 97 to 99%.  At this time, this assay has not been approved by the FDA to exclude DVT/VTE. Results should be correlated with clinical presentation.     Assessment/Plan: 1. Acute bacterial bronchitis With mild costochondritis. Supportive measures and OTC medications reviewed. Will begin ABX and albuterol. Delsym for cough. FU if symptoms are not resolving.  - azithromycin (ZITHROMAX) 250 MG tablet; Take 2 tablets on Day 1. Then take 1 tablet daily.  Dispense: 6 tablet; Refill: 0 - albuterol (PROVENTIL HFA;VENTOLIN HFA) 108 (90 Base) MCG/ACT inhaler; Inhale 2 puffs into the lungs every 6 (six) hours as needed for wheezing or shortness of breath.  Dispense: 1 Inhaler; Refill: 0 - SUMAtriptan (IMITREX) 100 MG tablet; Take 1 tablet (100 mg total) by mouth every 2 (two) hours as needed for migraine. May repeat in 2 hours if headache persists or recurs.  Dispense: 10 tablet; Refill: 6

## 2016-01-09 NOTE — Patient Instructions (Signed)
Please take the antibiotic as directed. Use the albuterol inhaler as directed to calm down cough. Continue pain medications as directed for the chest tenderness.  Increase fluids and rest. Use cough syrup as directed. Follow-up if symptoms are not resolving.

## 2016-01-12 ENCOUNTER — Telehealth: Payer: Self-pay | Admitting: *Deleted

## 2016-01-12 NOTE — Telephone Encounter (Signed)
Rx for Diazepam faxed and confirmed to [facility] at [phone].

## 2016-02-10 ENCOUNTER — Other Ambulatory Visit: Payer: Self-pay | Admitting: Obstetrics and Gynecology

## 2016-02-10 DIAGNOSIS — R921 Mammographic calcification found on diagnostic imaging of breast: Secondary | ICD-10-CM

## 2016-02-20 ENCOUNTER — Other Ambulatory Visit: Payer: Self-pay | Admitting: Neurology

## 2016-03-08 ENCOUNTER — Ambulatory Visit (INDEPENDENT_AMBULATORY_CARE_PROVIDER_SITE_OTHER): Payer: BLUE CROSS/BLUE SHIELD | Admitting: Neurology

## 2016-03-08 ENCOUNTER — Encounter: Payer: Self-pay | Admitting: Neurology

## 2016-03-08 VITALS — BP 116/76 | HR 82 | Resp 18 | Ht 69.0 in | Wt 121.0 lb

## 2016-03-08 DIAGNOSIS — G35 Multiple sclerosis: Secondary | ICD-10-CM

## 2016-03-08 DIAGNOSIS — N3941 Urge incontinence: Secondary | ICD-10-CM

## 2016-03-08 DIAGNOSIS — R200 Anesthesia of skin: Secondary | ICD-10-CM

## 2016-03-08 DIAGNOSIS — R208 Other disturbances of skin sensation: Secondary | ICD-10-CM

## 2016-03-08 DIAGNOSIS — G43019 Migraine without aura, intractable, without status migrainosus: Secondary | ICD-10-CM | POA: Diagnosis not present

## 2016-03-08 MED ORDER — NAPROXEN 500 MG PO TABS: 500.0000 mg | ORAL_TABLET | Freq: Two times a day (BID) | ORAL | Status: DC

## 2016-03-08 MED ORDER — BACLOFEN 10 MG PO TABS: 10.0000 mg | ORAL_TABLET | Freq: Two times a day (BID) | ORAL | Status: DC

## 2016-03-08 MED ORDER — METHYLPHENIDATE HCL 10 MG PO TABS: 10.0000 mg | ORAL_TABLET | Freq: Two times a day (BID) | ORAL | Status: DC

## 2016-03-08 NOTE — Progress Notes (Signed)
[facility]  PATIENT: Stacy Logan DOB: 1965-12-19  REFERRING DOCTOR OR PCP:  Annye Asa SOURCE: patient, EMR records from [facility].  MRI images on PACS  _________________________________   HISTORICAL  CHIEF COMPLAINT:  Chief Complaint  Patient presents with  . Multiple Sclerosis    Sts. she continues to tolerate Tecfidera well.  Sts. fatigue is worse--insurance would not cover Provigil.  Sts. focus is also worse.  Sts. spasms in arms and legs are worse. Numbness in toes is worse./fim    HISTORY OF PRESENT ILLNESS:  Stacy Logan is a 50 yo woman with multiple sclerosis and h/o compressive myelopathy.     MS:   She is currently on Tecfidera for the MS.   She tolerates it fairly well but has had some itching.   She denies stomach upset.   She feels MS is mostly stable but has noted the the right foot is dragging more and fatigue is worse.     Gait/strength/sensation/pain:  She feels her legs are tingling more with a rubber band sensation around the ankles/feet, right = left.  She also feels the balance is off.    She feels the right leg is dragging a lot and she has stumbled and tripped multiple times.     Her right arm feels weak and she has noted some fasciculations. EMG showed mild chronic C7 radiculopathy.   Left arm slightly weak.    Opening a bottle is difficult.   Both hands have numbness.  Baclofen helps spasticity but it makes her sleepy.  She only takes 10 mg at bedtime. She felt crazy on lamotrigine and stopped.   Gabapentin had not helped her much.     Percocet 10 mg every morning helps pain.    Bladder:   She continues to have urinary frequency and incontinence.   She sees Dr. Kary Kos.   She takes intravaginal valium if she gets spasms there.    Vision/eyes:   Her right eye is twitching a lot.   Twitching is worse in bright lights and when she is tired at night.   She feels peripheral vision is reduced.   Fatigue/sleep:  She having a lot of  fatigue, despite sleeping 12  hours a day between night time and a long afternoon nap. Fatigue is both physical and mental.    She sleeps poorly at night waking up a lot due to spasms and leg cramps.    She felt sick on Adderall XR in the past.   Insurance will not cover Provigil  Mood/cognition:   She has a little more depression and anxiety. She gets agoraphobia when shopping.     She takes Effexor 75 mg but does not think it is better than 37.5 mg.   Her friend committed suicide earlier in the year.   She notes a lot of difficulty with cognition and attention.  Specifically, organizing is difficult.   She is very forgetful and distractible.   She has trouble following through with tasks  Migraine:  She is having migraines 2-4 times monthly.  She has a lot more chance of migraine when a weather front comes through.   Imitrex helps but incompletely   MS/spine history:   In 2001, she was noted to have left hearing loss and had an MRI of the brain.   She did not have numbness or weakness a that time.  She was told she likely had MS.   She saw a neurologist.  She never had an LP.   She reports she was told she had MS but was never started on any medication.   Starting about 5-6 years ago, she was noting more difficulty with gait and also noted some memory issues.   She was also having bladder issues with urgency and bladder incontinence x 5 years.      However, she had no health insurance at that time and did not follow up with neurology.   She saw Dr. Tomi Likens last year.   He ordered an MRI of the brian and cervical spine.   She had one enhancing lesion on the spine and one additional lesion.  An LP was ordered but she opted not to do the test.   She was started on tecfidera.   She has had some itching and flushing, especially if she does not take after food.     She also was found to have severe spinal stenosis and myelopathy.   She was sent to Dr. Arnoldo Morale of Neurosurgery.   She underwent  ACDF from C3-C7  06/2014.    She reports she is having more trouble with her arms since the surgery.   The worse pain is in the 2nd and 3rd fingers but she has altered sensation with some pain in the other fingers as well.   She also has right > left lower neck pain.  Bladder is about the same (maybe slightly better) as it was before surgery.      I have personally reviewed the MRI of the brain dated 01/12/2015 and the MRIs of the cervical spine dated 02/01/2015 and 05/22/2014.   The MRI of the brain shows white matter foci predominantly in the deep white matter but also in the periventricular and subcortical white matter of both hemispheres. The brainstem appears normal. There is no significant atrophy. The MRI of the cervical spine from 2015 showed severe spinal stenosis at C5-C6 and milder spinal stenosis at C6-C7 and C4-C5. There is an enhancing focus within the spinal cord adjacent to C5-C6 and she has another hyperintense focus at C3-C4. On the second MRI, done without contrast, she is status post C3-C7 ACDF.      REVIEW OF SYSTEMS: Constitutional: No fevers, chills, sweats, or change in appetite Eyes: as above Ear, nose and throat: No hearing loss, ear pain, nasal congestion, sore throat Cardiovascular: No chest pain, palpitations Respiratory: No shortness of breath at rest or with exertion.   No wheezes GastrointestinaI: No nausea, vomiting, diarrhea, abdominal pain, fecal incontinence Genitourinary: see above Musculoskeletal: Some neck pain, back pain Integumentary: No rash, pruritus, skin lesions Neurological: as above Psychiatric: Notes depression and anxiety Endocrine: No palpitations, diaphoresis, change in appetite, change in weigh or increased thirst Hematologic/Lymphatic: No anemia, purpura, petechiae. Allergic/Immunologic: No itchy/runny eyes, nasal congestion, recent allergic reactions, rashes  ALLERGIES: Allergies  Allergen Reactions  . Codeine     Makes her "busy", itchy  . Lamictal  [Lamotrigine] Other (See Comments)    Mood changes   . Meperidine Nausea And Vomiting  . Meperidine Hcl Nausea And Vomiting    SEVERE N/V  . Morphine Other (See Comments)    "SKIN CRAWLS"    HOME MEDICATIONS:  Current outpatient prescriptions:  .  albuterol (PROVENTIL HFA;VENTOLIN HFA) 108 (90 Base) MCG/ACT inhaler, Inhale 2 puffs into the lungs every 6 (six) hours as needed for wheezing or shortness of breath., Disp: 1 Inhaler, Rfl: 0 .  amitriptyline (ELAVIL) 25 MG tablet, Take 25-50 mg by mouth  at bedtime., Disp: , Rfl:  .  azithromycin (ZITHROMAX) 250 MG tablet, Take 2 tablets on Day 1. Then take 1 tablet daily., Disp: 6 tablet, Rfl: 0 .  baclofen (LIORESAL) 10 MG tablet, TAKE ONE TABLET BY MOUTH TWICE DAILY AS NEEDED FOR MUSCLE SPASM, Disp: 60 tablet, Rfl: 0 .  benzonatate (TESSALON) 100 MG capsule, Take 1 capsule (100 mg total) by mouth 3 (three) times daily as needed for cough., Disp: 12 capsule, Rfl: 0 .  diazepam (VALIUM) 5 MG tablet, Take 1 tablet (5 mg total) by mouth every 8 (eight) hours as needed. For muscle spasms., Disp: 90 tablet, Rfl: 3 .  Dimethyl Fumarate (TECFIDERA) 240 MG CPDR, Take 1 capsule (240 mg total) by mouth 2 (two) times daily., Disp: 180 capsule, Rfl: 3 .  ferrous sulfate 325 (65 FE) MG tablet, Take 325 mg by mouth at bedtime., Disp: , Rfl:  .  gabapentin (NEURONTIN) 300 MG capsule, Take 1 capsule (300 mg total) by mouth 3 (three) times daily., Disp: 90 capsule, Rfl: 11 .  hydroxychloroquine (PLAQUENIL) 200 MG tablet, Take 200 mg by mouth at bedtime. , Disp: , Rfl:  .  Meth-Hyo-M Bl-Na Phos-Ph Sal (URIBEL) 118 MG CAPS, Take 1 capsule by mouth 3 (three) times daily as needed (PAIN/UTI). , Disp: , Rfl:  .  naphazoline (NAPHCON) 0.1 % ophthalmic solution, Place 1 drop into both eyes 4 (four) times daily as needed for irritation., Disp: , Rfl:  .  naproxen (NAPROSYN) 500 MG tablet, TAKE ONE TABLET BY MOUTH WITH FIRST DOSE OF IMITREX (Patient taking differently:  TAKE ONE TABLET BY MOUTH WITH FIRST DOSE OF IMITREX AS NEEDED FOR PAIN), Disp: 30 tablet, Rfl: 2 .  oxyCODONE-acetaminophen (PERCOCET) 10-325 MG per tablet, Take 1 tablet by mouth every 4 (four) hours as needed for pain., Disp: , Rfl:  .  SUMAtriptan (IMITREX) 100 MG tablet, Take 1 tablet (100 mg total) by mouth every 2 (two) hours as needed for migraine. May repeat in 2 hours if headache persists or recurs., Disp: 10 tablet, Rfl: 6 .  venlafaxine XR (EFFEXOR-XR) 75 MG 24 hr capsule, Take 1 capsule (75 mg total) by mouth daily with breakfast., Disp: 30 capsule, Rfl: 5  PAST MEDICAL HISTORY: Past Medical History  Diagnosis Date  . Arthritis   . Depression   . Migraines   . History of pneumothorax     1988-- SPONTANEOUS--  RESOLVED W/ CHEST TUBE  . Lesion of bladder   . H/O cold sores   . Borderline diabetes   . Frequency of urination   . Urgency of urination   . Nocturia   . Seasonal allergies   . Anal fissure   . Anxiety   . IC (interstitial cystitis)   . Pre-eclampsia   . MS (multiple sclerosis) ([place])     MRI showed plague on her brain  . RSD (reflex sympathetic dystrophy)     PAST SURGICAL HISTORY: Past Surgical History  Procedure Laterality Date  . Tonsillectomy and adenoidectomy  1972  . D & c hysteroscopy with polypectomy  12-02-2003  . Cesarean section  1994  . Dx laparoscopy/  fulgeration endometriosis/  appendectomy  1985  . Cystoscopy with hydrodistension and biopsy Bilateral 02/15/2014    Procedure: CYSTOSCOPY  BILATERAL RETROGRADE PYLOGRAM, HYDRODISTENSION, INSTILATION OF MARCAINE AND PYRIDIUM;  Surgeon: Ardis Hughs, MD;  Location: [facility];  Service: Urology;  Laterality: Bilateral;  . Appendectomy    . Dilation and curettage of uterus    .  Anterior cervical decompression/discectomy fusion 4 levels N/A 07/03/2014    Procedure: ANTERIOR CERVICAL DECOMPRESSION/DISCECTOMY FUSION 4 LEVELS;  Surgeon: Newman Pies, MD;  Location: [facility];   Service: Neurosurgery;  Laterality: N/A;  C3-4 C4-5 C5-6 C6-7 Anterior cervical decompression/diskectomy/fusion/interbody prosthesis/plate    FAMILY HISTORY: Family History  Problem Relation Age of Onset  . Hypertension Mother   . Diabetes Father   . Prostate cancer Father   . Lung cancer Father   . Cancer Father     cheek of mouth  . Diabetes Paternal Grandmother   . Diabetes Paternal Grandfather   . Diabetes Paternal Uncle     x 4  . Diabetes Paternal Aunt   . Colon cancer Maternal Uncle   . Esophageal cancer Neg Hx   . Rectal cancer Neg Hx   . Stomach cancer Neg Hx     SOCIAL HISTORY:  Social History   Social History  . Marital Status: Single    Spouse Name: N/A  . Number of Children: 1  . Years of Education: N/A   Occupational History  . self-employed house cleaning    Social History Main Topics  . Smoking status: Former Smoker -- 0.00 packs/day for 0 years    Types: Cigarettes    Quit date: 01/17/2011  . Smokeless tobacco: Never Used  . Alcohol Use: No  . Drug Use: No  . Sexual Activity: No   Other Topics Concern  . Not on file   Social History Narrative     PHYSICAL EXAM  Filed Vitals:   03/08/16 1051  BP: 116/76  Pulse: 82  Resp: 18  Height: 5\' 9"  (1.753 m)  Weight: 121 lb (54.885 kg)    Body mass index is 17.86 kg/(m^2).   General: The patient is a thin woman in no acute distress  Neck: The neck is post-op, no carotid bruits are noted.  The neck is nontender.   Skin: Extremities are without significant edema.  Musculoskeletal:  Back is nontender  Neurologic Exam  Mental status: The patient is alert and oriented x 3 at the time of the examination. The patient has apparent normal recent and remote memory, with an apparently normal attention span and concentration ability.   Speech is normal.  Cranial nerves: Occasional right > left eyelid twitches.   Extraocular movements are full. Pupils are equal, round, and reactive to light and  accomodation.    Facial symmetry is present. There is good facial sensation to soft touch bilaterally.Facial strength is normal.  Trapezius and sternocleidomastoid strength is normal. No dysarthria is noted.  The tongue is midline, and the patient has symmetric elevation of the soft palate. No obvious hearing deficits are noted.  Motor:  Muscle bulk is normal.   Tone is normal. Strength is  4+ / 5 in the right triceps and 4 minus/5 on all intrinsic hand muscles on the right 4/5 on all muscles on the left. APB is 5/5. and 5/5 in legs.   Sensory: Sensory testing shows decreased sensation to touch and vibration in left C6 and C7 dermatomes of hand and hyperthenar eminence on the right. Also mild decreased vibration in left leg, relative to right  Coordination: Cerebellar testing reveals good finger-nose-finger and ok heel-to-shin bilaterally (slower on right but accurate).  Gait and station: Station is normal.   Gait is normal. Tandem gait is wide. Romberg is negative.   Reflexes: Deep tendon reflexes are increased bilaterally with spread at the knees but no clonus.  DIAGNOSTIC DATA (LABS, IMAGING, TESTING) - I reviewed patient records, labs, notes, testing and imaging myself where available.  Lab Results  Component Value Date   WBC 10.5 01/03/2016   HGB 12.2 01/03/2016   HCT 37.1 01/03/2016   MCV 85.1 01/03/2016   PLT 270 01/03/2016      Component Value Date/Time   NA 136 01/03/2016 2314   K 3.7 01/03/2016 2314   CL 100* 01/03/2016 2314   CO2 23 01/03/2016 2314   GLUCOSE 113* 01/03/2016 2314   BUN 8 01/03/2016 2314   CREATININE 0.64 01/03/2016 2314   CREATININE 0.55 12/26/2014 1042   CALCIUM 9.4 01/03/2016 2314   PROT 7.0 05/15/2015 0842   ALBUMIN 4.2 05/15/2015 0842   AST 12 05/15/2015 0842   ALT 10 05/15/2015 0842   ALKPHOS 66 05/15/2015 0842   BILITOT 0.4 05/15/2015 0842   GFRNONAA >60 01/03/2016 2314   GFRNONAA >89 12/26/2014 1042   GFRAA >60 01/03/2016 2314    GFRAA >89 12/26/2014 1042   Lab Results  Component Value Date   CHOL 161 05/15/2015   HDL 62.60 05/15/2015   LDLCALC 86 05/15/2015   TRIG 61.0 05/15/2015   CHOLHDL 3 05/15/2015   Lab Results  Component Value Date   HGBA1C 5.9 05/15/2015   Lab Results  Component Value Date   VITAMINB12 154* 03/28/2015   Lab Results  Component Value Date   TSH 0.87 05/15/2015       ASSESSMENT AND PLAN  MS (multiple sclerosis) (H[facility])  Intractable migraine without aura and without status migrainosus  Numbness  Urge incontinence  Dysesthesia   1.   Continue Tecfidera.  2.   Ritalin for hypersomnia and MS fatigue 3.   Continue other medications but increase baclofen to two at bedtime 4.   Stay active.   Continue other medications 5.   If blepharospasm worsens, consider Botox.   She will return to see me in 4 months or sooner if she has new or worsening neurologic symptoms.      45 minute face-to-face evaluation with greater than one half of the time counseling and coordinating care about her MS and symptoms.  Richard A. Felecia Shelling, MD, PhD [id number], [id number] AM Certified in Neurology, Clinical Neurophysiology, Sleep Medicine, Pain Medicine and Neuroimaging  [facility] [address] [place], [place] [address] [phone]

## 2016-03-09 LAB — CBC WITH DIFFERENTIAL/PLATELET
BASOS: 0 %
Basophils Absolute: 0 10*3/uL (ref 0.0–0.2)
EOS (ABSOLUTE): 0.1 10*3/uL (ref 0.0–0.4)
EOS: 1 %
HEMATOCRIT: 39.8 % (ref 34.0–46.6)
Hemoglobin: 13.2 g/dL (ref 11.1–15.9)
IMMATURE GRANS (ABS): 0 10*3/uL (ref 0.0–0.1)
IMMATURE GRANULOCYTES: 0 %
LYMPHS: 11 %
Lymphocytes Absolute: 0.8 10*3/uL (ref 0.7–3.1)
MCH: 29.3 pg (ref 26.6–33.0)
MCHC: 33.2 g/dL (ref 31.5–35.7)
MCV: 88 fL (ref 79–97)
Monocytes Absolute: 0.4 10*3/uL (ref 0.1–0.9)
Monocytes: 5 %
NEUTROS PCT: 83 %
Neutrophils Absolute: 6 10*3/uL (ref 1.4–7.0)
PLATELETS: 141 10*3/uL — AB (ref 150–379)
RBC: 4.51 x10E6/uL (ref 3.77–5.28)
RDW: 16.5 % — ABNORMAL HIGH (ref 12.3–15.4)
WBC: 7.2 10*3/uL (ref 3.4–10.8)

## 2016-03-10 ENCOUNTER — Telehealth: Payer: Self-pay | Admitting: *Deleted

## 2016-03-10 NOTE — Telephone Encounter (Signed)
-----   Message from Britt Bottom, MD sent at 03/09/2016  4:46 PM EDT ----- Please let her know that the white blood cells are fine.  Continued Tecfidera.

## 2016-03-10 NOTE — Telephone Encounter (Signed)
I have spoken with Stacy Logan this morning, and per RAS, advised wbc's are ok; she should continue Tecfidera as rx'd.  She verbalized understanding of same/fim

## 2016-04-05 ENCOUNTER — Telehealth: Payer: Self-pay | Admitting: Neurology

## 2016-04-05 NOTE — Telephone Encounter (Signed)
Patient called regarding, right eye twitching, left eye twitching, states "Dr. Felecia Shelling is aware of this, patient states now, left side, top lip is pulling up, requests BOTOX to make it stop because face hurts".

## 2016-04-06 NOTE — Telephone Encounter (Signed)
Per RAS, ok for Botox to treat bilat blepharospasm.  Order given to [facility]. I attempted to contact [place] to let her know--received message that vm has not been set up/fim

## 2016-04-17 ENCOUNTER — Other Ambulatory Visit: Payer: Self-pay | Admitting: Family Medicine

## 2016-05-10 ENCOUNTER — Ambulatory Visit
Admission: RE | Admit: 2016-05-10 | Discharge: 2016-05-10 | Disposition: A | Discharge status: Home / Self Care | Payer: BLUE CROSS/BLUE SHIELD | Source: Ambulatory Visit | Attending: Obstetrics and Gynecology | Admitting: Obstetrics and Gynecology

## 2016-05-10 ENCOUNTER — Other Ambulatory Visit: Payer: Self-pay | Admitting: Obstetrics and Gynecology

## 2016-05-10 DIAGNOSIS — N6320 Unspecified lump in the left breast, unspecified quadrant: Secondary | ICD-10-CM

## 2016-05-10 DIAGNOSIS — R921 Mammographic calcification found on diagnostic imaging of breast: Secondary | ICD-10-CM

## 2016-05-13 ENCOUNTER — Other Ambulatory Visit: Payer: Self-pay | Admitting: General Surgery

## 2016-05-17 ENCOUNTER — Telehealth: Payer: Self-pay | Admitting: Genetic Counselor

## 2016-05-17 ENCOUNTER — Telehealth: Payer: Self-pay | Admitting: Neurology

## 2016-05-17 ENCOUNTER — Encounter: Payer: Self-pay | Admitting: Genetic Counselor

## 2016-05-17 NOTE — Telephone Encounter (Signed)
Stacy Logan- Botox 100 units [facility] new Rx G24.5 [id number]  Called [facility] and they are verifying benefits done as stat request. I have to call [facility] back 05-18-2016.

## 2016-05-17 NOTE — Telephone Encounter (Signed)
Pt confirmed appt, completed intake, requested that nothing be mailed out. Faxed referring provider appt date/time

## 2016-05-18 NOTE — Telephone Encounter (Signed)
Botox will be delivered 05-19-2016 in am. Patient's apt 05-19-2016 at 2:00 pm.

## 2016-05-19 ENCOUNTER — Telehealth: Payer: Self-pay | Admitting: Hematology and Oncology

## 2016-05-19 ENCOUNTER — Ambulatory Visit (INDEPENDENT_AMBULATORY_CARE_PROVIDER_SITE_OTHER): Payer: BLUE CROSS/BLUE SHIELD | Admitting: Neurology

## 2016-05-19 ENCOUNTER — Encounter: Payer: Self-pay | Admitting: Genetic Counselor

## 2016-05-19 ENCOUNTER — Encounter: Payer: Self-pay | Admitting: Hematology and Oncology

## 2016-05-19 VITALS — BP 106/70 | HR 82 | Resp 14 | Ht 69.0 in | Wt 124.5 lb

## 2016-05-19 DIAGNOSIS — G245 Blepharospasm: Secondary | ICD-10-CM | POA: Insufficient documentation

## 2016-05-19 DIAGNOSIS — R208 Other disturbances of skin sensation: Secondary | ICD-10-CM

## 2016-05-19 DIAGNOSIS — R200 Anesthesia of skin: Secondary | ICD-10-CM | POA: Diagnosis not present

## 2016-05-19 DIAGNOSIS — R5383 Other fatigue: Secondary | ICD-10-CM

## 2016-05-19 DIAGNOSIS — G518 Other disorders of facial nerve: Secondary | ICD-10-CM

## 2016-05-19 DIAGNOSIS — G35 Multiple sclerosis: Secondary | ICD-10-CM | POA: Diagnosis not present

## 2016-05-19 DIAGNOSIS — F418 Other specified anxiety disorders: Secondary | ICD-10-CM | POA: Diagnosis not present

## 2016-05-19 DIAGNOSIS — G5139 Clonic hemifacial spasm, unspecified: Secondary | ICD-10-CM | POA: Insufficient documentation

## 2016-05-19 MED ORDER — METHYLPHENIDATE HCL 10 MG PO TABS: ORAL_TABLET | ORAL | 0 refills | Status: DC

## 2016-05-19 NOTE — Progress Notes (Signed)
[facility]  PATIENT: Stacy Logan DOB: 02/21/1966  REFERRING DOCTOR OR PCP:  Annye Asa SOURCE: patient, EMR records from [facility].  MRI images on PACS  _________________________________   HISTORICAL  CHIEF COMPLAINT:  Chief Complaint  Patient presents with  . Multiple Sclerosis    Here for Botox inj. to treat Blepharospasm./fim    HISTORY OF PRESENT ILLNESS:  Stacy Logan is a 50 yo woman with multiple sclerosis and h/o compressive myelopathy.    She notes bilateral, right greater than left spasms of the eyelids. More recently, she is also noting similar spasms of the corner of her mouth on the right.  The eyelid twitching is worse in bright lights and when she is tired. She has never tried Botox therapy and today will be her first series of injections.      MS:   She is currently on Tecfidera for the MS.   She has some itching but no rash.   She denies stomach upset.   She feels MS is mostly stable.   However, fatigue is worse.     Other:   She was just diagnosed with breast cancer and has an appointment to see a surgeon.  Gait/strength/sensation/pain:  Her right leg drags some causing her to stumble. She trips and has had a couple falls. She feels her balance is off as well.    She notes a tight dysesthetic sensation around the ankles/feet, right = left.      Her right arm feels slightly weak and she noted some fasciculations. EMG showed mild chronic C7 radiculopathy.   Both hands have numbness.  Baclofen helps spasticity but it makes her sleepy.  She only takes 10 mg at bedtime.     Percocet 10 mg every morning helps pain.    Lamotrigine was not tolerated and gabapentin did not help.  Bladder:   She continues to have urinary frequency and incontinence.   She sees Dr. Kary Kos.   She takes intravaginal valium if she gets spasms there.    Vision/eyes:   She has eyelid twitching. This is worse on her right. She notes some decreased peripheral vision  seems symmetric.  Fatigue/sleep:  She having more fatigue, despite sleeping 12  hours a day between night time and a long afternoon nap. Fatigue is both physical and mental.    She sleeps poorly at night waking up a lot due to spasms and leg cramps.    She is tolerating the methylphenidate and feels it helps a little bit. She was unable to tolerate Adderall.  Insurance will not cover Provigil  Mood/cognition:   She has depression and anxiety. She gets agoraphobia when shopping.     She takes Effexor 75 mg with only mild benefit   Her friend committed suicide earlier in the year.   She notes a lot of difficulty with cognition and attention.  Specifically, organizing is difficult.   She is very forgetful and distractible.   She has trouble following through with tasks  Migraine:  She is having migraines 6-7 times a month pain mostly in the forehead.  She has a lot more chance of migraine when a weather front comes through.   Imitrex helps but incompletely   MS/spine history:   In 2001, she was noted to have left hearing loss and had an MRI of the brain.   She did not have numbness or weakness a that time.  She was told she likely had MS.  She saw a neurologist.  She never had an LP.   She reports she was told she had MS but was never started on any medication.   Starting about 5-6 years ago, she was noting more difficulty with gait and also noted some memory issues.   She was also having bladder issues with urgency and bladder incontinence x 5 years.      However, she had no health insurance at that time and did not follow up with neurology.   She saw Dr. Tomi Likens last year.   He ordered an MRI of the brian and cervical spine.   She had one enhancing lesion on the spine and one additional lesion.  An LP was ordered but she opted not to do the test.   She was started on tecfidera.   She has had some itching and flushing, especially if she does not take after food.     She also was found to have severe spinal  stenosis and myelopathy.   She was sent to Dr. Arnoldo Morale of Neurosurgery.   She underwent  ACDF from C3-C7 06/2014.    She reports she is having more trouble with her arms since the surgery.   The worse pain is in the 2nd and 3rd fingers but she has altered sensation with some pain in the other fingers as well.   She also has right > left lower neck pain.  Bladder is about the same (maybe slightly better) as it was before surgery.      I have personally reviewed the MRI of the brain dated 01/12/2015 and the MRIs of the cervical spine dated 02/01/2015 and 05/22/2014.   The MRI of the brain shows white matter foci predominantly in the deep white matter but also in the periventricular and subcortical white matter of both hemispheres. The brainstem appears normal. There is no significant atrophy. The MRI of the cervical spine from 2015 showed severe spinal stenosis at C5-C6 and milder spinal stenosis at C6-C7 and C4-C5. There is an enhancing focus within the spinal cord adjacent to C5-C6 and she has another hyperintense focus at C3-C4. On the second MRI, done without contrast, she is status post C3-C7 ACDF.      REVIEW OF SYSTEMS: Constitutional: No fevers, chills, sweats, or change in appetite Eyes: as above Ear, nose and throat: No hearing loss, ear pain, nasal congestion, sore throat Cardiovascular: No chest pain, palpitations Respiratory: No shortness of breath at rest or with exertion.   No wheezes GastrointestinaI: No nausea, vomiting, diarrhea, abdominal pain, fecal incontinence Genitourinary: see above Musculoskeletal: Some neck pain, back pain Integumentary: No rash, pruritus, skin lesions Neurological: as above Psychiatric: Notes depression and anxiety Endocrine: No palpitations, diaphoresis, change in appetite, change in weigh or increased thirst Hematologic/Lymphatic: No anemia, purpura, petechiae. Allergic/Immunologic: No itchy/runny eyes, nasal congestion, recent allergic reactions,  rashes  ALLERGIES: Allergies  Allergen Reactions  . Codeine     Makes her "busy", itchy  . Lamictal [Lamotrigine] Other (See Comments)    Mood changes   . Meperidine Nausea And Vomiting  . Meperidine Hcl Nausea And Vomiting    SEVERE N/V  . Morphine Other (See Comments)    "SKIN CRAWLS"    HOME MEDICATIONS:  Current Outpatient Prescriptions:  .  albuterol (PROVENTIL HFA;VENTOLIN HFA) 108 (90 Base) MCG/ACT inhaler, Inhale 2 puffs into the lungs every 6 (six) hours as needed for wheezing or shortness of breath., Disp: 1 Inhaler, Rfl: 0 .  amitriptyline (ELAVIL) 25 MG tablet,  Take 25-50 mg by mouth at bedtime., Disp: , Rfl:  .  azithromycin (ZITHROMAX) 250 MG tablet, Take 2 tablets on Day 1. Then take 1 tablet daily., Disp: 6 tablet, Rfl: 0 .  baclofen (LIORESAL) 10 MG tablet, Take 1 tablet (10 mg total) by mouth 2 (two) times daily., Disp: 60 tablet, Rfl: 11 .  benzonatate (TESSALON) 100 MG capsule, Take 1 capsule (100 mg total) by mouth 3 (three) times daily as needed for cough., Disp: 12 capsule, Rfl: 0 .  diazepam (VALIUM) 5 MG tablet, Take 1 tablet (5 mg total) by mouth every 8 (eight) hours as needed. For muscle spasms., Disp: 90 tablet, Rfl: 3 .  Dimethyl Fumarate (TECFIDERA) 240 MG CPDR, Take 1 capsule (240 mg total) by mouth 2 (two) times daily., Disp: 180 capsule, Rfl: 3 .  ferrous sulfate 325 (65 FE) MG tablet, Take 325 mg by mouth at bedtime., Disp: , Rfl:  .  gabapentin (NEURONTIN) 300 MG capsule, Take 1 capsule (300 mg total) by mouth 3 (three) times daily., Disp: 90 capsule, Rfl: 11 .  hydroxychloroquine (PLAQUENIL) 200 MG tablet, Take 200 mg by mouth at bedtime. , Disp: , Rfl:  .  Meth-Hyo-M Bl-Na Phos-Ph Sal (URIBEL) 118 MG CAPS, Take 1 capsule by mouth 3 (three) times daily as needed (PAIN/UTI). , Disp: , Rfl:  .  methylphenidate (RITALIN) 10 MG tablet, Take 2 pills in the am and 1 pill at noon, Disp: 90 tablet, Rfl: 0 .  naphazoline (NAPHCON) 0.1 % ophthalmic solution,  Place 1 drop into both eyes 4 (four) times daily as needed for irritation., Disp: , Rfl:  .  naproxen (NAPROSYN) 500 MG tablet, Take 1 tablet (500 mg total) by mouth 2 (two) times daily with a meal., Disp: 60 tablet, Rfl: 5 .  oxyCODONE-acetaminophen (PERCOCET) 10-325 MG per tablet, Take 1 tablet by mouth every 4 (four) hours as needed for pain., Disp: , Rfl:  .  SUMAtriptan (IMITREX) 100 MG tablet, Take 1 tablet (100 mg total) by mouth every 2 (two) hours as needed for migraine. May repeat in 2 hours if headache persists or recurs., Disp: 10 tablet, Rfl: 6 .  valACYclovir (VALTREX) 1000 MG tablet, TAKE TWO TABLETS BY MOUTH EVERY 12 HOURS FOR  2  DOSES  AS  NEEDED  FOR  COLD  SORES, Disp: 30 tablet, Rfl: 5 .  venlafaxine XR (EFFEXOR-XR) 75 MG 24 hr capsule, Take 1 capsule (75 mg total) by mouth daily with breakfast., Disp: 30 capsule, Rfl: 5  PAST MEDICAL HISTORY: Past Medical History:  Diagnosis Date  . Anal fissure   . Anxiety   . Arthritis   . Borderline diabetes   . Depression   . Frequency of urination   . H/O cold sores   . History of pneumothorax    1988-- SPONTANEOUS--  RESOLVED W/ CHEST TUBE  . IC (interstitial cystitis)   . Lesion of bladder   . Migraines   . MS (multiple sclerosis) ([place])    MRI showed plague on her brain  . Nocturia   . Pre-eclampsia   . RSD (reflex sympathetic dystrophy)   . Seasonal allergies   . Urgency of urination     PAST SURGICAL HISTORY: Past Surgical History:  Procedure Laterality Date  . ANTERIOR CERVICAL DECOMPRESSION/DISCECTOMY FUSION 4 LEVELS N/A 07/03/2014   Procedure: ANTERIOR CERVICAL DECOMPRESSION/DISCECTOMY FUSION 4 LEVELS;  Surgeon: Newman Pies, MD;  Location: [facility];  Service: Neurosurgery;  Laterality: N/A;  C3-4 C4-5 C5-6 C6-7  Anterior cervical decompression/diskectomy/fusion/interbody prosthesis/plate  . APPENDECTOMY    . CESAREAN SECTION  1994  . CYSTOSCOPY WITH HYDRODISTENSION AND BIOPSY Bilateral 02/15/2014    Procedure: CYSTOSCOPY  BILATERAL RETROGRADE PYLOGRAM, HYDRODISTENSION, INSTILATION OF MARCAINE AND PYRIDIUM;  Surgeon: Ardis Hughs, MD;  [facility];  Service: Urology;  Laterality: Bilateral;  . D & C HYSTEROSCOPY WITH POLYPECTOMY  12-02-2003  . DILATION AND CURETTAGE OF UTERUS    . DX LAPAROSCOPY/  FULGERATION ENDOMETRIOSIS/  APPENDECTOMY  1985  . TONSILLECTOMY AND ADENOIDECTOMY  1972    FAMILY HISTORY: Family History  Problem Relation Age of Onset  . Hypertension Mother   . Diabetes Father   . Prostate cancer Father   . Lung cancer Father   . Cancer Father     cheek of mouth  . Diabetes Paternal Grandmother   . Diabetes Paternal Grandfather   . Diabetes Paternal Uncle     x 4  . Diabetes Paternal Aunt   . Colon cancer Maternal Uncle   . Esophageal cancer Neg Hx   . Rectal cancer Neg Hx   . Stomach cancer Neg Hx     SOCIAL HISTORY:  Social History   Social History  . Marital status: Single    Spouse name: N/A  . Number of children: 1  . Years of education: N/A   Occupational History  . self-employed house cleaning    Social History Main Topics  . Smoking status: Former Smoker    Packs/day: 0.00    Years: 0.00    Types: Cigarettes    Quit date: 01/17/2011  . Smokeless tobacco: Never Used  . Alcohol use No  . Drug use: No  . Sexual activity: No   Other Topics Concern  . Not on file   Social History Narrative  . No narrative on file     PHYSICAL EXAM  Vitals:   05/19/16 1405  BP: 106/70  Pulse: 82  Resp: 14  Weight: 124 lb 8 oz (56.5 kg)  Height: 5\' 9"  (1.753 m)    Body mass index is 18.39 kg/m.   General: The patient is a thin woman in no acute distress  Neck: The neck is post-op, no carotid bruits are noted.  The neck is nontender.   Skin: Extremities are without significant edema.  Musculoskeletal:  Back is nontender  Neurologic Exam  Mental status: The patient is alert and oriented x 3 at the time of  the examination. The patient has apparent normal recent and remote memory, with an apparently normal attention span and concentration ability.   Speech is normal.  Cranial nerves: Occasional right > left eyelid twitches.   Extraocular movements are full. Pupils are equal, round, and reactive to light and accomodation.    Facial symmetry is present. There is good facial sensation to soft touch bilaterally.Facial strength is normal.  Trapezius and sternocleidomastoid strength is normal. No dysarthria is noted.  The tongue is midline, and the patient has symmetric elevation of the soft palate. No obvious hearing deficits are noted.  Motor:  Muscle bulk is normal.   Tone is normal. Strength is  4+ / 5 in the right triceps and 4 minus/5 on all intrinsic hand muscles on the right 4/5 on all muscles on the left. APB is 5/5. and 5/5 in legs.   Sensory: Sensory testing shows decreased sensation to touch and vibration in left C6 and C7 dermatomes of hand and hyperthenar eminence on the right. Also mild decreased  vibration in left leg, relative to right  Coordination: Cerebellar testing reveals good finger-nose-finger and ok heel-to-shin bilaterally (slower on right but accurate).  Gait and station: Station is normal.   Gait is normal. Tandem gait is wide. Romberg is negative.   Reflexes: Deep tendon reflexes are increased bilaterally with spread at the knees but no clonus.       DIAGNOSTIC DATA (LABS, IMAGING, TESTING) - I reviewed patient records, labs, notes, testing and imaging myself where available.  Lab Results  Component Value Date   WBC 7.2 03/08/2016   HGB 12.2 01/03/2016   HCT 39.8 03/08/2016   MCV 88 03/08/2016   PLT 141 (L) 03/08/2016      Component Value Date/Time   NA 136 01/03/2016 2314   K 3.7 01/03/2016 2314   CL 100 (L) 01/03/2016 2314   CO2 23 01/03/2016 2314   GLUCOSE 113 (H) 01/03/2016 2314   BUN 8 01/03/2016 2314   CREATININE 0.64 01/03/2016 2314   CREATININE 0.55  12/26/2014 1042   CALCIUM 9.4 01/03/2016 2314   PROT 7.0 05/15/2015 0842   ALBUMIN 4.2 05/15/2015 0842   AST 12 05/15/2015 0842   ALT 10 05/15/2015 0842   ALKPHOS 66 05/15/2015 0842   BILITOT 0.4 05/15/2015 0842   GFRNONAA >60 01/03/2016 2314   GFRNONAA >89 12/26/2014 1042   GFRAA >60 01/03/2016 2314   GFRAA >89 12/26/2014 1042     __________________________________________________  BOTOX INJECTION  The risks and benefits of Botox injection brain to Mrs. Donatelli. We discussed the possibility of ptosis as the most common side effect with injection for blepharospasm and hemifacial spasm.   The following injections were performed using sterile technique.  2.5 Units medial pretarsal orbicularis oculi of the left upper lid 2.5 Units lateral pretarsal orbicularis oculi of the left upper lid 2.5 Units lateral pretarsal orbicularis oculi of the left lower lid 10 Units left frontalis (5 U x 2 spots) 5.0 Units left buccinator  2.5 Units medial pretarsal orbicularis oculi of the right upper lid 2.5 Units lateral pretarsal orbicularis oculi of the right upper lid 2.5 Units lateral pretarsal orbicularis oculi of the right lower lid 10 Units right frontalis (5 U x 2 spots) 5.0 Units right buccinator  55 Units wasted   ASSESSMENT AND PLAN  MS (multiple sclerosis) ([facility])  Blepharospasm  Hemifacial spasm  Other fatigue  Numbness  Depression with anxiety  Dysesthesia   1.   Botox injection as above for blepharospasm and hemifacial spasm. 2.   Continue Tecfidera for MS  3.   Ritalin for hypersomnia and MS fatigue 4.   Stay active.   Continue other medications 5.    Due to the combination of her physical impairments from MS as well as her mood disturbance , cognitive dysfunction and fatigue, Mrs. Paugh is disabled.     6.   He will return in 3 months or sooner if there are new or worsening neurologic symptoms.    Richard A. Felecia Shelling, MD, PhD [id number], [id number] PM Certified in  Neurology, Clinical Neurophysiology, Sleep Medicine, Pain Medicine and Neuroimaging  [facility] [address] [place], [place] [address] [phone]

## 2016-05-19 NOTE — Telephone Encounter (Signed)
Appointment scheduled with Gudena for 9/1 at 9:30am. Patient agreed to appt. Demographics verified. Letter to the referring.

## 2016-05-21 ENCOUNTER — Encounter: Payer: Self-pay | Admitting: Hematology and Oncology

## 2016-05-21 ENCOUNTER — Encounter: Payer: Self-pay | Admitting: *Deleted

## 2016-05-21 ENCOUNTER — Ambulatory Visit (HOSPITAL_BASED_OUTPATIENT_CLINIC_OR_DEPARTMENT_OTHER): Payer: BLUE CROSS/BLUE SHIELD | Admitting: Hematology and Oncology

## 2016-05-21 ENCOUNTER — Other Ambulatory Visit: Payer: Self-pay | Admitting: General Surgery

## 2016-05-21 DIAGNOSIS — Z170 Estrogen receptor positive status [ER+]: Secondary | ICD-10-CM | POA: Diagnosis not present

## 2016-05-21 DIAGNOSIS — C50412 Malignant neoplasm of upper-outer quadrant of left female breast: Secondary | ICD-10-CM

## 2016-05-21 NOTE — Progress Notes (Signed)
[facility] NOTE  Patient Care Team: Brunetta Jeans, PA-C as PCP - General (Family Medicine) Newman Pies, MD as Consulting Physician (Neurosurgery) Ardis Hughs, MD as Attending Physician (Urology) Arvella Nigh, MD as Consulting Physician (Obstetrics and Gynecology)  CHIEF COMPLAINTS/PURPOSE OF CONSULTATION:  Newly diagnosed breast cancer  HISTORY OF PRESENTING ILLNESS:  Stacy Logan 50 y.o. female is here because of recent diagnosis of left breast cancer. Patient felt a lump in the left breast and then underwent a mammogram. The mammogram revealed a 1.2 cm lesion in the left breast. There were many grouped calcifications that are present in both her breasts that were stable. She underwent ultrasound guided biopsy which came back as invasive ductal carcinoma that was ER/PR positive and HER-2 negative with a Ki-67 of 10%. She was discussed at the multidisciplinary tumor board and she is here today to discuss adjuvant treatment plan. She is accompanied by her friend. Patient has a long-standing history of multiple sclerosis and has sensory and motor deficits along with some bladder problems. She is currently on treatment for multiple sclerosis.  I reviewed her records extensively and collaborated the history with the patient.  SUMMARY OF ONCOLOGIC HISTORY:   Breast cancer of upper-outer quadrant of left female breast ([place])   05/10/2016 Initial Diagnosis    Left breast biopsy UOQ: IDC with DCIS, grade 1-2, ER 100%, PR 10%, Ki-67 10%, HER-2 negative ratio 1.59      05/10/2016 Mammogram    Left breast: 1.2 cm irregular marginated mass 1:00 position 6 cm from the nipple. Numerous grouped calcifications throughout right breast: Stable compared to prior, Left breast: calcifications stable, T1 cN0 stage IA clinical stage       MEDICAL HISTORY:  Past Medical History:  Diagnosis Date  . Anal fissure   . Anxiety   . Arthritis   . Borderline diabetes   .  Depression   . Frequency of urination   . H/O cold sores   . History of pneumothorax    1988-- SPONTANEOUS--  RESOLVED W/ CHEST TUBE  . IC (interstitial cystitis)   . Lesion of bladder   . Migraines   . MS (multiple sclerosis) ([place])    MRI showed plague on her brain  . Nocturia   . Pre-eclampsia   . RSD (reflex sympathetic dystrophy)   . Seasonal allergies   . Urgency of urination     SURGICAL HISTORY: Past Surgical History:  Procedure Laterality Date  . ANTERIOR CERVICAL DECOMPRESSION/DISCECTOMY FUSION 4 LEVELS N/A 07/03/2014   Procedure: ANTERIOR CERVICAL DECOMPRESSION/DISCECTOMY FUSION 4 LEVELS;  Surgeon: Newman Pies, MD;  Location: [facility];  Service: Neurosurgery;  Laterality: N/A;  C3-4 C4-5 C5-6 C6-7 Anterior cervical decompression/diskectomy/fusion/interbody prosthesis/plate  . APPENDECTOMY    . CESAREAN SECTION  1994  . CYSTOSCOPY WITH HYDRODISTENSION AND BIOPSY Bilateral 02/15/2014   Procedure: CYSTOSCOPY  BILATERAL RETROGRADE PYLOGRAM, HYDRODISTENSION, INSTILATION OF MARCAINE AND PYRIDIUM;  Surgeon: Ardis Hughs, MD;  Location: [facility];  Service: Urology;  Laterality: Bilateral;  . D & C HYSTEROSCOPY WITH POLYPECTOMY  12-02-2003  . DILATION AND CURETTAGE OF UTERUS    . DX LAPAROSCOPY/  FULGERATION ENDOMETRIOSIS/  APPENDECTOMY  1985  . TONSILLECTOMY AND ADENOIDECTOMY  1972    SOCIAL HISTORY: Social History   Social History  . Marital status: Single    Spouse name: N/A  . Number of children: 1  . Years of education: N/A   Occupational History  . self-employed house  cleaning    Social History Main Topics  . Smoking status: Former Smoker    Packs/day: 0.00    Years: 0.00    Types: Cigarettes    Quit date: 01/17/2011  . Smokeless tobacco: Never Used  . Alcohol use No  . Drug use: No  . Sexual activity: No   Other Topics Concern  . Not on file   Social History Narrative  . No narrative on file    FAMILY HISTORY: Family  History  Problem Relation Age of Onset  . Hypertension Mother   . Diabetes Father   . Prostate cancer Father   . Lung cancer Father   . Cancer Father     cheek of mouth  . Diabetes Paternal Grandmother   . Diabetes Paternal Grandfather   . Diabetes Paternal Uncle     x 4  . Diabetes Paternal Aunt   . Colon cancer Maternal Uncle   . Esophageal cancer Neg Hx   . Rectal cancer Neg Hx   . Stomach cancer Neg Hx     ALLERGIES:  is allergic to codeine; lamictal [lamotrigine]; meperidine; meperidine hcl; and morphine.  MEDICATIONS:  Current Outpatient Prescriptions  Medication Sig Dispense Refill  . albuterol (PROVENTIL HFA;VENTOLIN HFA) 108 (90 Base) MCG/ACT inhaler Inhale 2 puffs into the lungs every 6 (six) hours as needed for wheezing or shortness of breath. 1 Inhaler 0  . amitriptyline (ELAVIL) 25 MG tablet Take 25-50 mg by mouth at bedtime.    Marland Kitchen azithromycin (ZITHROMAX) 250 MG tablet Take 2 tablets on Day 1. Then take 1 tablet daily. 6 tablet 0  . baclofen (LIORESAL) 10 MG tablet Take 1 tablet (10 mg total) by mouth 2 (two) times daily. 60 tablet 11  . benzonatate (TESSALON) 100 MG capsule Take 1 capsule (100 mg total) by mouth 3 (three) times daily as needed for cough. 12 capsule 0  . diazepam (VALIUM) 5 MG tablet Take 1 tablet (5 mg total) by mouth every 8 (eight) hours as needed. For muscle spasms. 90 tablet 3  . Dimethyl Fumarate (TECFIDERA) 240 MG CPDR Take 1 capsule (240 mg total) by mouth 2 (two) times daily. 180 capsule 3  . ferrous sulfate 325 (65 FE) MG tablet Take 325 mg by mouth at bedtime.    . gabapentin (NEURONTIN) 300 MG capsule Take 1 capsule (300 mg total) by mouth 3 (three) times daily. 90 capsule 11  . hydroxychloroquine (PLAQUENIL) 200 MG tablet Take 200 mg by mouth at bedtime.     . Meth-Hyo-M Bl-Na Phos-Ph Sal (URIBEL) 118 MG CAPS Take 1 capsule by mouth 3 (three) times daily as needed (PAIN/UTI).     Marland Kitchen methylphenidate (RITALIN) 10 MG tablet Take 2 pills in the  am and 1 pill at noon 90 tablet 0  . naphazoline (NAPHCON) 0.1 % ophthalmic solution Place 1 drop into both eyes 4 (four) times daily as needed for irritation.    . naproxen (NAPROSYN) 500 MG tablet Take 1 tablet (500 mg total) by mouth 2 (two) times daily with a meal. 60 tablet 5  . oxyCODONE-acetaminophen (PERCOCET) 10-325 MG per tablet Take 1 tablet by mouth every 4 (four) hours as needed for pain.    . SUMAtriptan (IMITREX) 100 MG tablet Take 1 tablet (100 mg total) by mouth every 2 (two) hours as needed for migraine. May repeat in 2 hours if headache persists or recurs. 10 tablet 6  . valACYclovir (VALTREX) 1000 MG tablet TAKE TWO TABLETS BY MOUTH EVERY  12 HOURS FOR  2  DOSES  AS  NEEDED  FOR  COLD  SORES 30 tablet 5  . venlafaxine XR (EFFEXOR-XR) 75 MG 24 hr capsule Take 1 capsule (75 mg total) by mouth daily with breakfast. 30 capsule 5   No current facility-administered medications for this visit.     REVIEW OF SYSTEMS:   Constitutional: Denies fevers, chills or abnormal night sweats Eyes: Denies blurriness of vision, double vision or watery eyes Ears, nose, mouth, throat, and face: Denies mucositis or sore throat Respiratory: Denies cough, dyspnea or wheezes Cardiovascular: Denies palpitation, chest discomfort or lower extremity swelling Gastrointestinal:  Denies nausea, heartburn or change in bowel habits Skin: Denies abnormal skin rashes Lymphatics: Denies new lymphadenopathy or easy bruising Neurological:Denies numbness, tingling or new weaknesses Behavioral/Psych: Mood is stable, no new changes  Breast: Breast lump is palpable All other systems were reviewed with the patient and are negative.  PHYSICAL EXAMINATION: ECOG PERFORMANCE STATUS: 1 - Symptomatic but completely ambulatory  Vitals:   05/21/16 0943  BP: 126/87  Pulse: 78  Resp: 18  Temp: 99 F (37.2 C)   Filed Weights   05/21/16 0943  Weight: 121 lb 11.2 oz (55.2 kg)    GENERAL:alert, no distress and  comfortable SKIN: skin color, texture, turgor are normal, no rashes or significant lesions EYES: normal, conjunctiva are pink and non-injected, sclera clear OROPHARYNX:no exudate, no erythema and lips, buccal mucosa, and tongue normal  NECK: supple, thyroid normal size, non-tender, without nodularity LYMPH:  no palpable lymphadenopathy in the cervical, axillary or inguinal LUNGS: clear to auscultation and percussion with normal breathing effort HEART: regular rate & rhythm and no murmurs and no lower extremity edema ABDOMEN:abdomen soft, non-tender and normal bowel sounds Musculoskeletal:no cyanosis of digits and no clubbing  PSYCH: alert & oriented x 3 with fluent speech NEURO: no focal motor/sensory deficits BREAST: Left breast palpable lump and bruising from biopsy. No palpable axillary or supraclavicular lymphadenopathy (exam performed in the presence of a chaperone)   LABORATORY DATA:  I have reviewed the data as listed Lab Results  Component Value Date   WBC 7.2 03/08/2016   HGB 12.2 01/03/2016   HCT 39.8 03/08/2016   MCV 88 03/08/2016   PLT 141 (L) 03/08/2016   Lab Results  Component Value Date   NA 136 01/03/2016   K 3.7 01/03/2016   CL 100 (L) 01/03/2016   CO2 23 01/03/2016    RADIOGRAPHIC STUDIES: I have personally reviewed the radiological reports and agreed with the findings in the report.  ASSESSMENT AND PLAN:  Breast cancer of upper-outer quadrant of left female breast ([place]) Left breast biopsy 05/10/2016 UOQ: IDC with DCIS, grade 1-2, ER 100%, PR 10%, Ki-67 10%, HER-2 negative ratio 1.59 Mammogram/ultrasound 05/10/2016 Left breast: 1.2 cm irregular marginated mass 1:00 position 6 cm from the nipple. Numerous grouped calcifications throughout right breast: Stable compared to prior, Left breast: calcifications stable, T1 cN0 stage IA clinical stage  Pathology and radiology counseling:Discussed with the patient, the details of pathology including the type of breast  cancer,the clinical staging, the significance of ER, PR and HER-2/neu receptors and the implications for treatment. After reviewing the pathology in detail, we proceeded to discuss the different treatment options between surgery, radiation, chemotherapy, antiestrogen therapies.  Recommendations:Genetic counseling and testing: Patient does not want to wait for the results.  1. Breast conserving surgery followed by 2. Oncotype DX testing to determine if chemotherapy would be of any benefit followed by 3. Adjuvant radiation  therapy followed by 4. Adjuvant antiestrogen therapy With anastrozole 1 mg daily.  Oncotype counseling: I discussed Oncotype DX test. I explained to the patient that this is a 21 gene panel to evaluate patient tumors DNA to calculate recurrence score. This would help determine whether patient has high risk or intermediate risk or low risk breast cancer. She understands that if her tumor was found to be high risk, she would benefit from systemic chemotherapy. If low risk, no need of chemotherapy. If she was found to be intermediate risk, we would need to evaluate the score as well as other risk factors and determine if an abbreviated chemotherapy may be of benefit.  Patient has multiple sclerosis and she will check with her neurologist about anastrozole and interaction with her current multiple sclerosis treatment regimen. One of her major goals to see her is to go to [place] in October. She does not want to start her radiation until she returns back from [place] vacation. Return to clinic after surgery to discuss final pathology report and then determine if Oncotype DX testing will need to be sent.  All questions were answered. The patient knows to call the clinic with any problems, questions or concerns.    Rulon Eisenmenger, MD 05/21/16

## 2016-05-21 NOTE — Assessment & Plan Note (Signed)
Left breast biopsy 05/10/2016 UOQ: IDC with DCIS, grade 1-2, ER 100%, PR 10%, Ki-67 10%, HER-2 negative ratio 1.59 Mammogram/ultrasound 05/10/2016 Left breast: 1.2 cm irregular marginated mass 1:00 position 6 cm from the nipple. Numerous grouped calcifications throughout right breast: Stable compared to prior, Left breast: calcifications stable, T1 cN0 stage IA clinical stage  Pathology and radiology counseling:Discussed with the patient, the details of pathology including the type of breast cancer,the clinical staging, the significance of ER, PR and HER-2/neu receptors and the implications for treatment. After reviewing the pathology in detail, we proceeded to discuss the different treatment options between surgery, radiation, chemotherapy, antiestrogen therapies.  Recommendations: 1. Breast conserving surgery followed by 2. Oncotype DX testing to determine if chemotherapy would be of any benefit followed by 3. Adjuvant radiation therapy followed by 4. Adjuvant antiestrogen therapy  Oncotype counseling: I discussed Oncotype DX test. I explained to the patient that this is a 21 gene panel to evaluate patient tumors DNA to calculate recurrence score. This would help determine whether patient has high risk or intermediate risk or low risk breast cancer. She understands that if her tumor was found to be high risk, she would benefit from systemic chemotherapy. If low risk, no need of chemotherapy. If she was found to be intermediate risk, we would need to evaluate the score as well as other risk factors and determine if an abbreviated chemotherapy may be of benefit.  Return to clinic after surgery to discuss final pathology report and then determine if Oncotype DX testing will need to be sent.

## 2016-05-25 NOTE — Progress Notes (Addendum)
Location of Breast Cancer: Left Breast  Histology per Pathology Report:  05/10/16 Diagnosis Breast, left, needle core biopsy, UOQ - INVASIVE DUCTAL CARCINOMA. - DUCTAL CARCINOMA IN SITU WITH CALCIFICATIONS.  Receptor Status: ER(100%), PR (10%), Her2-neu (NEG), Ki-(10%)  Did patient present with symptoms or was this found on screening mammography?: She self palpated a lump in her Left Breast, and presented for mammogram.  Past/Anticipated interventions by surgeon, if any: She had an appointment with Dr. Dalbert Batman 05/13/16 She has surgery scheduled for 06/07/16  Past/Anticipated interventions by medical oncology, if any:  05/21/16 Dr. Lindi Adie Recommendations:Genetic counseling and testing: Patient does not want to wait for the results.  1. Breast conserving surgery followed by 2. Oncotype DX testing to determine if chemotherapy would be of any benefit followed by 3. Adjuvant radiation therapy followed by 4. Adjuvant antiestrogen therapy With anastrozole 1 mg daily  Genetic counseling with Jeanine Luz 05/27/16  Lymphedema issues, if any: N/A  Pain issues, if any:  She has chronic pain from MS. Today she reports pain a 6/10 in her Arm, hands, and fingers.   SAFETY ISSUES:  Prior radiation? No  Pacemaker/ICD? No  Possible current pregnancy? No  Is the patient on methotrexate? No  Current Complaints / other details:   Patient has a long-standing history of multiple sclerosis and has sensory and motor deficits along with some bladder problems. She is currently on treatment for multiple sclerosis. She tells me that if there are any question related to [place] and treatment we can call Dr. Kerman Passey (her [place] doctor) She also has a trip planned to [place] in October, and would like to go on this trip.   BP 136/85   Pulse 77   Temp 98.7 F (37.1 C)   Ht 5' 9" (1.753 m)   Wt 124 lb 9.6 oz (56.5 kg)   SpO2 99% Comment: room air  BMI 18.40 kg/m    Wt Readings from Last 3 Encounters:  05/28/16  124 lb 9.6 oz (56.5 kg)  05/21/16 121 lb 11.2 oz (55.2 kg)  05/19/16 124 lb 8 oz (56.5 kg)      Christene Pounds, Stephani Police, RN 05/25/2016,9:41 AM

## 2016-05-27 ENCOUNTER — Ambulatory Visit (HOSPITAL_BASED_OUTPATIENT_CLINIC_OR_DEPARTMENT_OTHER): Payer: BLUE CROSS/BLUE SHIELD | Admitting: Genetic Counselor

## 2016-05-27 ENCOUNTER — Other Ambulatory Visit: Payer: BLUE CROSS/BLUE SHIELD

## 2016-05-27 ENCOUNTER — Encounter: Payer: Self-pay | Admitting: *Deleted

## 2016-05-27 ENCOUNTER — Other Ambulatory Visit: Payer: Self-pay | Admitting: General Surgery

## 2016-05-27 DIAGNOSIS — Z803 Family history of malignant neoplasm of breast: Secondary | ICD-10-CM | POA: Diagnosis not present

## 2016-05-27 DIAGNOSIS — Z800 Family history of malignant neoplasm of digestive organs: Secondary | ICD-10-CM

## 2016-05-27 DIAGNOSIS — C50412 Malignant neoplasm of upper-outer quadrant of left female breast: Secondary | ICD-10-CM | POA: Diagnosis not present

## 2016-05-27 DIAGNOSIS — Z8042 Family history of malignant neoplasm of prostate: Secondary | ICD-10-CM

## 2016-05-27 DIAGNOSIS — Z801 Family history of malignant neoplasm of trachea, bronchus and lung: Secondary | ICD-10-CM

## 2016-05-27 DIAGNOSIS — Z315 Encounter for genetic counseling: Secondary | ICD-10-CM

## 2016-05-27 DIAGNOSIS — Z809 Family history of malignant neoplasm, unspecified: Secondary | ICD-10-CM

## 2016-05-28 ENCOUNTER — Encounter: Payer: Self-pay | Admitting: Genetic Counselor

## 2016-05-28 ENCOUNTER — Ambulatory Visit
Admission: RE | Admit: 2016-05-28 | Discharge: 2016-05-28 | Disposition: A | Discharge status: Home / Self Care | Payer: BLUE CROSS/BLUE SHIELD | Source: Ambulatory Visit | Attending: Radiation Oncology | Admitting: Radiation Oncology

## 2016-05-28 ENCOUNTER — Encounter: Payer: Self-pay | Admitting: Radiation Oncology

## 2016-05-28 VITALS — BP 136/85 | HR 77 | Temp 98.7°F | Ht 69.0 in | Wt 124.6 lb

## 2016-05-28 DIAGNOSIS — Z8042 Family history of malignant neoplasm of prostate: Secondary | ICD-10-CM | POA: Insufficient documentation

## 2016-05-28 DIAGNOSIS — G35 Multiple sclerosis: Secondary | ICD-10-CM | POA: Diagnosis not present

## 2016-05-28 DIAGNOSIS — R7303 Prediabetes: Secondary | ICD-10-CM | POA: Diagnosis not present

## 2016-05-28 DIAGNOSIS — F329 Major depressive disorder, single episode, unspecified: Secondary | ICD-10-CM | POA: Insufficient documentation

## 2016-05-28 DIAGNOSIS — Z801 Family history of malignant neoplasm of trachea, bronchus and lung: Secondary | ICD-10-CM | POA: Diagnosis not present

## 2016-05-28 DIAGNOSIS — G9050 Complex regional pain syndrome I, unspecified: Secondary | ICD-10-CM | POA: Insufficient documentation

## 2016-05-28 DIAGNOSIS — C50412 Malignant neoplasm of upper-outer quadrant of left female breast: Secondary | ICD-10-CM

## 2016-05-28 DIAGNOSIS — Z8249 Family history of ischemic heart disease and other diseases of the circulatory system: Secondary | ICD-10-CM | POA: Insufficient documentation

## 2016-05-28 DIAGNOSIS — E079 Disorder of thyroid, unspecified: Secondary | ICD-10-CM | POA: Insufficient documentation

## 2016-05-28 DIAGNOSIS — M1990 Unspecified osteoarthritis, unspecified site: Secondary | ICD-10-CM | POA: Insufficient documentation

## 2016-05-28 DIAGNOSIS — Z885 Allergy status to narcotic agent status: Secondary | ICD-10-CM | POA: Insufficient documentation

## 2016-05-28 DIAGNOSIS — F419 Anxiety disorder, unspecified: Secondary | ICD-10-CM | POA: Insufficient documentation

## 2016-05-28 DIAGNOSIS — Z87891 Personal history of nicotine dependence: Secondary | ICD-10-CM | POA: Diagnosis not present

## 2016-05-28 DIAGNOSIS — Z888 Allergy status to other drugs, medicaments and biological substances status: Secondary | ICD-10-CM | POA: Insufficient documentation

## 2016-05-28 DIAGNOSIS — Z833 Family history of diabetes mellitus: Secondary | ICD-10-CM | POA: Diagnosis not present

## 2016-05-28 DIAGNOSIS — Z510 Encounter for antineoplastic radiation therapy: Secondary | ICD-10-CM | POA: Insufficient documentation

## 2016-05-28 NOTE — Progress Notes (Signed)
REFERRING PROVIDER: Nicholas Lose, MD  PRIMARY PROVIDER:  Leeanne Rio, PA-C  PRIMARY REASON FOR VISIT:  1. Breast cancer of upper-outer quadrant of left female breast ([place])   2. Family history of breast cancer in female   3. Family history of prostate cancer in father   109. Family history of colon cancer   5. Family history of lung cancer   6. Family history of cancer      HISTORY OF PRESENT ILLNESS:   Stacy Logan, a 50 y.o. female, was seen for a [place] cancer genetics consultation at the request of Dr. Lindi Adie due to a personal history of breast cancer and family history of breast, prostate, colon and other cancers.  Stacy Logan presents to clinic today to discuss the possibility of a hereditary predisposition to cancer, genetic testing, and to further clarify her future cancer risks, as well as potential cancer risks for family members.   In August 2017, at the age of 42, Stacy Logan was diagnosed with invasive ductal carcinoma with DCIS of the left breast.  Hormone receptor status was ER/PR+, Her2-. Genetic testing will help inform the treatment plan.  Stacy Logan does not want to wait for genetic test results to come back before having her surgery.  Stacy Logan also has a history of a spontaneous pneumothorax in 3 (age 14) and a history of a thyroid nodule diagnosed in August 2016 that is currently being monitored.   CANCER HISTORY:    Breast cancer of upper-outer quadrant of left female breast ([place])   05/10/2016 Initial Diagnosis    Left breast biopsy UOQ: IDC with DCIS, grade 1-2, ER 100%, PR 10%, Ki-67 10%, HER-2 negative ratio 1.59      05/10/2016 Mammogram    Left breast: 1.2 cm irregular marginated mass 1:00 position 6 cm from the nipple. Numerous grouped calcifications throughout right breast: Stable compared to prior, Left breast: calcifications stable, T1 cN0 stage IA clinical stage        HORMONAL RISK FACTORS:  Menarche was at age 2.  First live birth at age  21.  OCP use for approximately less than 1 year.  Ovaries intact: yes.  Hysterectomy: no.  Menopausal status: postmenopausal.  HRT use: 0 years. Colonoscopy: yes, first was in 04/2014 and found one hyperplastic polyp, one polypoid polyp, and one tubulovillous adenoma; is due for next colonoscopy in 2018. Mammogram within the last year: yes. Number of breast biopsies: 1. Up to date with pelvic exams:  yes. Any excessive radiation exposure/other exposures in the past:  History of secondhand smoke exposure (father smoked)  Past Medical History:  Diagnosis Date  . Anal fissure   . Anxiety   . Arthritis   . Borderline diabetes   . Depression   . Frequency of urination   . H/O cold sores   . History of pneumothorax    1988-- SPONTANEOUS--  RESOLVED W/ CHEST TUBE  . IC (interstitial cystitis)   . Lesion of bladder   . Migraines   . MS (multiple sclerosis) ([place])    MRI showed plague on her brain  . Nocturia   . Pre-eclampsia   . RSD (reflex sympathetic dystrophy)   . Seasonal allergies   . Urgency of urination     Past Surgical History:  Procedure Laterality Date  . ANTERIOR CERVICAL DECOMPRESSION/DISCECTOMY FUSION 4 LEVELS N/A 07/03/2014   Procedure: ANTERIOR CERVICAL DECOMPRESSION/DISCECTOMY FUSION 4 LEVELS;  Surgeon: Newman Pies, MD;  Location: [facility];  Service: Neurosurgery;  Laterality: N/A;  C3-4 C4-5 C5-6 C6-7 Anterior cervical decompression/diskectomy/fusion/interbody prosthesis/plate  . APPENDECTOMY    . CESAREAN SECTION  1994  . CYSTOSCOPY WITH HYDRODISTENSION AND BIOPSY Bilateral 02/15/2014   Procedure: CYSTOSCOPY  BILATERAL RETROGRADE PYLOGRAM, HYDRODISTENSION, INSTILATION OF MARCAINE AND PYRIDIUM;  Surgeon: Ardis Hughs, MD;  Location: [facility];  Service: Urology;  Laterality: Bilateral;  . D & C HYSTEROSCOPY WITH POLYPECTOMY  12-02-2003  . DILATION AND CURETTAGE OF UTERUS    . DX LAPAROSCOPY/  FULGERATION ENDOMETRIOSIS/  APPENDECTOMY   1985  . TONSILLECTOMY AND ADENOIDECTOMY  1972    Social History   Social History  . Marital status: Single    Spouse name: N/A  . Number of children: 1  . Years of education: N/A   Occupational History  . self-employed house cleaning    Social History Main Topics  . Smoking status: Former Smoker    Packs/day: 0.25    Years: 27.00    Types: Cigarettes    Quit date: 01/17/2011  . Smokeless tobacco: Never Used  . Alcohol use No  . Drug use: No  . Sexual activity: No   Other Topics Concern  . None   Social History Narrative  . None     FAMILY HISTORY:  We obtained a detailed, 4-generation family history.  Significant diagnoses are listed below: Family History  Problem Relation Age of Onset  . Hypertension Mother   . Diabetes Father   . Prostate cancer Father     dx late 58s  . Lung cancer Father 45    smoker  . Cancer Father     cheek of mouth, dx. between 59 and 19  . Diabetes Paternal Grandmother   . Diabetes Paternal Grandfather   . Aneurysm Maternal Grandmother 76    d. brain aneurysm  . Colon cancer Maternal Grandfather 78  . Diabetes Paternal Uncle     x 4  . Diabetes Paternal Aunt   . Colon cancer Maternal Uncle   . Cancer Maternal Uncle     mouth cancer; +tobacco  . Breast cancer Sister 86    s/p mastectomy  . Breast cancer Maternal Aunt     dx 29s  . Lymphoma Maternal Aunt 83  . Uterine cancer Maternal Aunt     d. late 22s  . Throat cancer Cousin     maternal 1st cousin; lim info  . Cancer Paternal Uncle     NOS cancer  . Breast cancer Cousin     paternal 1st cousin dx 39s  . Esophageal cancer Neg Hx   . Rectal cancer Neg Hx   . Stomach cancer Neg Hx     Stacy Logan has one daughter, age 46.  She has two full sisters, ages 39 and 25. Her older sister has a history of breast cancer, diagnosed at 41 and treated with mastectomy.  This sister has one daughter who is 76 and cancer-free.  Stacy Logan mother is 24 and has never had cancer.  Her  father died of lung cancer at 77; he was a smoker.  He also had a history of prostate cancer, diagnosed in his late 66s and a history of cancer of the cheek of his mouth between the prostate and lung cancer diagnoses.  Stacy Logan mother has three full sisters and three full brothers.  One brother died at a young age from a mouth cancer; he was a tobacco user.  One sister was diagnosed with and passed away  from breast cancer in her 59s.  One sister, currently 35, has a history of lymphoma diagnosed at 65.  The other sister died of uterine cancer in her late 25s.  Stacy Logan reports that one maternal first cousin has a history of throat cancer.  Her maternal grandmother died of a brain aneurysm at 81.  Her grandfather was diagnosed with and passed away from colon cancer at 56. Stacy Logan has no information for her maternal great aunts/uncles and great grandparents.    Stacy Logan father has one twin sister and four other full brothers.  His twin sister is currently 9 and has not had cancer.  She has one daughter who has passed away and one who has a history of breast cancer diagnosed in her 43s; she also has two sons who have not had cancer.  One brother had a history of an unspecified type of cancer.  Another brother died of unspecified muscular issues in his 49s.  Stacy Logan is unaware of any other family history of muscular issues.  Stacy Logan has no information for her paternal first cousins.  Her paternal grandmother died in her 93s.  Her grandfather died of diabetes-related causes in his 40s.  She has no information for her paternal great aunts/uncles and great grandparents.   Stacy Logan reports no known family history of genetic testing for hereditary cancer.  Patient's maternal and paternal ancestors are of [place] descent. There is no reported Ashkenazi Jewish ancestry. There is no known consanguinity.  GENETIC COUNSELING ASSESSMENT: Stacy Logan is a 50 y.o. female with a personal and family history of  cancer which is somewhat suggestive of a hereditary cancer syndrome and predisposition to cancer. We, therefore, discussed and recommended the following at today's visit.   DISCUSSION: We reviewed the characteristics, features and inheritance patterns of hereditary cancer syndromes, particularly those caused by mutations within the BRCA1/2, CHEK2, and Lynch syndrome genes. We also discussed genetic testing, including the appropriate family members to test, the process of testing, insurance coverage and turn-around-time for results. We discussed the implications of a negative, positive and/or variant of uncertain significant result. We discussed adding on the FLCN gene due to Stacy Logan additional history of a spontaneous pneumothorax in 1988, and Stacy Logan agrees with this plan.  Thus, we recommended Ms. Gorman pursue genetic testing for a Custom Cancer Panel through [facility].  This Custom Cancer Panel offered by [facility] includes sequencing and/or deletion duplication testing of the following 33 genes: APC, ATM, AXIN2, BARD1, BMPR1A, BRCA1, BRCA2, BRIP1, CDH1, CDK4, CDKN2A, CHEK2, EPCAM, FANCC, FLCN, MLH1, MSH2, MSH6, MUTYH, NBN, PALB2, PMS2, POLD1, POLE, PTEN, RAD51C, RAD51D, SCG5/GREM1, SMAD4, STK11, TP53, VHL, and XRCC2.    Based on Ms. Freeburg personal and family history of cancer, she meets medical criteria for genetic testing. Despite that she meets criteria, she may still have an out of pocket cost. We discussed that if her out of pocket cost for testing is over $100, the laboratory will call and confirm whether she wants to proceed with testing.  If the out of pocket cost of testing is less than $100 she will be billed by the genetic testing laboratory.   PLAN: After considering the risks, benefits, and limitations, Ms. Warriner  provided informed consent to pursue genetic testing and the blood sample was sent to [facility] for analysis of the 33-gene Custom Panel. Results should be  available within approximately 2-3 weeks' time, at which point they will be disclosed by telephone  to Ms. Soria, as will any additional recommendations warranted by these results. Ms. Shannahan will receive a summary of her genetic counseling visit and a copy of her results once available. This information will also be available in Epic. We encouraged Ms. Creighton to remain in contact with cancer genetics annually so that we can continuously update the family history and inform her of any changes in cancer genetics and testing that may be of benefit for her family. Ms. Losasso questions were answered to her satisfaction today. Our contact information was provided should additional questions or concerns arise.  Thank you for the referral and allowing [place] to share in the care of your patient.   Jeanine Luz, MS, [facility] Certified Genetic Counselor [email or web address] Phone: [phone]  The patient was seen for a total of 60 minutes in face-to-face genetic counseling.  This patient was discussed with Drs. Magrinat, Lindi Adie and/or Burr Medico who agrees with the above.    _______________________________________________________________________ For Office Staff:  Number of people involved in session: 2 Was an Intern/ student involved with case: yes

## 2016-05-28 NOTE — Progress Notes (Addendum)
Radiation Oncology         [phone] ________________________________  Initial Outpatient Consultation  Name: Stacy Logan MRN: [id number]  Date: 05/28/2016  DOB: Apr 21, 1966  HE:NIDPOE, Sandria Manly, PA-C  Fanny Skates, MD   REFERRING PHYSICIAN: Fanny Skates, MD  DIAGNOSIS:    ICD-9-CM ICD-10-CM   1. Breast cancer of upper-outer quadrant of left female breast ([place]) 174.4 C50.412 Ambulatory referral to Social Work   Clinical Stage IA (T1cN0M0)   Left Breast, UOQ, Invasive Ductal Carcinoma, ER 100% + / PR 10% + / Her2 -, Grade 1-2  HISTORY OF PRESENT ILLNESS::Stacy Logan is a 50 y.o. female who presented with a palpable left breast mass.   The patient underwent  bilateral diagnostic breast mammogram on 05/10/16. This showed numerous grouped and scattered calcifications throughout the right breast and are stable when compared to the prior study. Imaging of the left breast showed similar diffuse calcifications and the development of a 1.2 cm mass in the UOQ at approximately the 1:00 position with several faint suspicious calcifications located centrally within the mass. On physical exam at that time, a firm mass was palpated in the left breast at the 1:00 position 6 cm from the nipple measuring approximately 1 cm in size. Ultrasound showed a hypoechoic mass, measuring 1.2 x 0.9 x 0.7 cm, in the left breast 1:00 position 6 cm from the nipple with internal calcifications and shadowing. Ultrasound of the left axilla was negative.  Biopsy of the UOQ of the left breast on 05/10/16 showed grade 1-2 invasive ductal carcinoma and DCIS with calcifications (ER 100% +, PR 10% +, HER2 -, Ki67 10%).  The patient presented to Dr. Lindi Adie on 05/21/16 who recommended genetic counseling and testing, breast conservation surgery, Oncotype DX testing to determine if chemotherapy would be of any benefit, adjuvant radiation therapy, and then adjuvant antiestrogen therapy with Anastrozole 1 mg daily. With the  patient undergoing a multiple sclerosis treatment regimen, she will check with her neurologist about anastrozole and her current treatment. The patient presented to genetic counseling yesterday.  The patient presents today to discuss the role of radiation in the management of her disease. Anticipates lumpectomy in 10 days with Dr Dalbert Batman.  PREVIOUS RADIATION THERAPY: No  PAST MEDICAL HISTORY:  has a past medical history of Anal fissure; Anxiety; Arthritis; Borderline diabetes; Depression; Frequency of urination; H/O cold sores; History of pneumothorax; IC (interstitial cystitis); Lesion of bladder; Migraines; MS (multiple sclerosis) ([place]); Nocturia; Pre-eclampsia; RSD (reflex sympathetic dystrophy); Seasonal allergies; and Urgency of urination.    PAST SURGICAL HISTORY: Past Surgical History:  Procedure Laterality Date  . ANTERIOR CERVICAL DECOMPRESSION/DISCECTOMY FUSION 4 LEVELS N/A 07/03/2014   Procedure: ANTERIOR CERVICAL DECOMPRESSION/DISCECTOMY FUSION 4 LEVELS;  Surgeon: Newman Pies, MD;  Location: [facility];  Service: Neurosurgery;  Laterality: N/A;  C3-4 C4-5 C5-6 C6-7 Anterior cervical decompression/diskectomy/fusion/interbody prosthesis/plate  . APPENDECTOMY    . CESAREAN SECTION  1994  . CYSTOSCOPY WITH HYDRODISTENSION AND BIOPSY Bilateral 02/15/2014   Procedure: CYSTOSCOPY  BILATERAL RETROGRADE PYLOGRAM, HYDRODISTENSION, INSTILATION OF MARCAINE AND PYRIDIUM;  Surgeon: Ardis Hughs, MD;  Location: [facility];  Service: Urology;  Laterality: Bilateral;  . D & C HYSTEROSCOPY WITH POLYPECTOMY  12-02-2003  . DILATION AND CURETTAGE OF UTERUS    . DX LAPAROSCOPY/  FULGERATION ENDOMETRIOSIS/  APPENDECTOMY  1985  . TONSILLECTOMY AND ADENOIDECTOMY  1972    FAMILY HISTORY: family history includes Cancer in her father; Colon cancer in her maternal uncle; Diabetes in  her father, paternal aunt, paternal grandfather, paternal grandmother, and paternal uncle; Hypertension  in her mother; Lung cancer in her father; Prostate cancer in her father.  SOCIAL HISTORY:  reports that she quit smoking about 5 years ago. Her smoking use included Cigarettes. She smoked 0.00 packs per day for 0.00 years. She has never used smokeless tobacco. She reports that she does not drink alcohol or use drugs.  ALLERGIES: Codeine; Lamictal [lamotrigine]; Meperidine; Meperidine hcl; and Morphine  MEDICATIONS:  Current Outpatient Prescriptions  Medication Sig Dispense Refill  . albuterol (PROVENTIL HFA;VENTOLIN HFA) 108 (90 Base) MCG/ACT inhaler Inhale 2 puffs into the lungs every 6 (six) hours as needed for wheezing or shortness of breath. 1 Inhaler 0  . amitriptyline (ELAVIL) 25 MG tablet Take 25-50 mg by mouth at bedtime.    . baclofen (LIORESAL) 10 MG tablet Take 1 tablet (10 mg total) by mouth 2 (two) times daily. 60 tablet 11  . diazepam (VALIUM) 5 MG tablet Take 1 tablet (5 mg total) by mouth every 8 (eight) hours as needed. For muscle spasms. 90 tablet 3  . Dimethyl Fumarate (TECFIDERA) 240 MG CPDR Take 1 capsule (240 mg total) by mouth 2 (two) times daily. 180 capsule 3  . gabapentin (NEURONTIN) 300 MG capsule Take 1 capsule (300 mg total) by mouth 3 (three) times daily. 90 capsule 11  . Meth-Hyo-M Bl-Na Phos-Ph Sal (URIBEL) 118 MG CAPS Take 1 capsule by mouth 3 (three) times daily as needed (PAIN/UTI).     Marland Kitchen methylphenidate (RITALIN) 10 MG tablet Take 2 pills in the am and 1 pill at noon 90 tablet 0  . naproxen (NAPROSYN) 500 MG tablet Take 1 tablet (500 mg total) by mouth 2 (two) times daily with a meal. 60 tablet 5  . oxyCODONE-acetaminophen (PERCOCET) 10-325 MG per tablet Take 1 tablet by mouth every 4 (four) hours as needed for pain.    . SUMAtriptan (IMITREX) 100 MG tablet Take 1 tablet (100 mg total) by mouth every 2 (two) hours as needed for migraine. May repeat in 2 hours if headache persists or recurs. 10 tablet 6  . valACYclovir (VALTREX) 1000 MG tablet TAKE TWO TABLETS  BY MOUTH EVERY 12 HOURS FOR  2  DOSES  AS  NEEDED  FOR  COLD  SORES 30 tablet 5  . venlafaxine XR (EFFEXOR-XR) 75 MG 24 hr capsule Take 1 capsule (75 mg total) by mouth daily with breakfast. 30 capsule 5  . benzonatate (TESSALON) 100 MG capsule Take 1 capsule (100 mg total) by mouth 3 (three) times daily as needed for cough. (Patient not taking: Reported on 05/28/2016) 12 capsule 0  . hydroxychloroquine (PLAQUENIL) 200 MG tablet Take 200 mg by mouth at bedtime.     . naphazoline (NAPHCON) 0.1 % ophthalmic solution Place 1 drop into both eyes 4 (four) times daily as needed for irritation.     No current facility-administered medications for this encounter.     REVIEW OF SYSTEMS:  Notable for that above.  The patient reports chronic pain from MS. Today she reports pain as a 6/10 in her arms, hands, and fingers. She also has sensory and motor deficits along with some bladder problems.   PHYSICAL EXAM:  height is 5' 9" (1.753 m) and weight is 124 lb 9.6 oz (56.5 kg). Her temperature is 98.7 F (37.1 C). Her blood pressure is 136/85 and her pulse is 77. Her oxygen saturation is 99%.   General: Alert and oriented, in no acute distress  HEENT: Head is normocephalic. Extraocular movements are intact. Oropharynx is clear. Neck: Neck is supple, no palpable cervical or supraclavicular lymphadenopathy. Area of fullness in the right thyroid region. Heart: Regular in rate and rhythm with no murmurs, rubs, or gallops. Chest: Clear to auscultation bilaterally, with no rhonchi, wheezes, or rales. Abdomen: Soft, nontender, nondistended, with no rigidity or guarding. Extremities: No cyanosis. Patient's joints are swollen in her fingers. Lymphatics: see Neck Exam Skin: No concerning lesions. Musculoskeletal: symmetric strength and muscle tone throughout. Neurologic: Cranial nerves II through XII are grossly intact. No obvious focalities. Speech is fluent.  Sometimes has word finding difficulty. Psychiatric:  Judgment and insight are intact. Affect is appropriate. Breasts: 1 cm mass in the UOQ of the left breast. No other palpable masses appreciated in the breasts or axillae.  ECOG = 1  0 - Asymptomatic (Fully active, able to carry on all predisease activities without restriction)  1 - Symptomatic but completely ambulatory (Restricted in physically strenuous activity but ambulatory and able to carry out work of a light or sedentary nature. For example, light housework, office work)  2 - Symptomatic, <50% in bed during the day (Ambulatory and capable of all self care but unable to carry out any work activities. Up and about more than 50% of waking hours)  3 - Symptomatic, >50% in bed, but not bedbound (Capable of only limited self-care, confined to bed or chair 50% or more of waking hours)  4 - Bedbound (Completely disabled. Cannot carry on any self-care. Totally confined to bed or chair)  5 - Death   Eustace Pen MM, Creech RH, Tormey DC, et al. [phone]). "Toxicity and response criteria of the [facility]". [place] Oncol. 5 (6): 649-55   LABORATORY DATA:  Lab Results  Component Value Date   WBC 7.2 03/08/2016   HGB 12.2 01/03/2016   HCT 39.8 03/08/2016   MCV 88 03/08/2016   PLT 141 (L) 03/08/2016   CMP     Component Value Date/Time   NA 136 01/03/2016 2314   K 3.7 01/03/2016 2314   CL 100 (L) 01/03/2016 2314   CO2 23 01/03/2016 2314   GLUCOSE 113 (H) 01/03/2016 2314   BUN 8 01/03/2016 2314   CREATININE 0.64 01/03/2016 2314   CREATININE 0.55 12/26/2014 1042   CALCIUM 9.4 01/03/2016 2314   PROT 7.0 05/15/2015 0842   ALBUMIN 4.2 05/15/2015 0842   AST 12 05/15/2015 0842   ALT 10 05/15/2015 0842   ALKPHOS 66 05/15/2015 0842   BILITOT 0.4 05/15/2015 0842   GFRNONAA >60 01/03/2016 2314   GFRNONAA >89 12/26/2014 1042   GFRAA >60 01/03/2016 2314   GFRAA >89 12/26/2014 1042         RADIOGRAPHY: Mm Digital Diagnostic Unilat L  Result Date:  05/10/2016 CLINICAL DATA:  Ultrasound-guided core needle biopsy was performed of a suspicious palpable mass 1 o'clock position left breast 6 cm from the nipple. EXAM: DIAGNOSTIC LEFT MAMMOGRAM POST ULTRASOUND BIOPSY COMPARISON:  Previous exam(s). FINDINGS: Mammographic images were obtained following ultrasound guided biopsy of the 1.2 cm irregular mass in the left breast at 1 o'clock position. A coil shaped biopsy clip is satisfactorily positioned along the lateral margin of the mass. IMPRESSION: Satisfactory position of coil shaped biopsy marker clip. Final Assessment: Post Procedure Mammograms for Marker Placement Electronically Signed   By: Curlene Dolphin M.D.   On: 05/10/2016 15:26   US Breast Ltd Uni Left Inc Axilla  Result Date: 05/10/2016 CLINICAL DATA:  Patient was asked to return for a six-month follow-up evaluation of probably benign right breast calcifications on a 04/28/2015 diagnostic evaluation. The patient did not return. She now returns. The patient has a history of multiple sclerosis. EXAM: 2D DIGITAL DIAGNOSTIC BILATERAL MAMMOGRAM WITH CAD AND ADJUNCT TOMO ULTRASOUND RIGHT BREAST COMPARISON:  04/28/2015, 04/14/2015. ACR Breast Density Category c: The breast tissue is heterogeneously dense, which may obscure small masses. FINDINGS: There are numerous grouped and scattered calcifications throughout the right breast. Several of these calcifications do layer. There are no worrisome linear or branching forms. These are stable when compared with the prior study. There are similar diffuse calcifications present within the left breast. The patient has developed an irregularly marginated mass within the upper-outer quadrant of the left breast at approximately the 1 o'clock position. There are several faint suspicious calcifications located centrally within this mass. The mass measures 1.2 cm in size by mammography. Mammographic images were processed with CAD. On physical exam, there is a firm palpable  mass located within the left breast at the 1 o'clock position 6 cm from the nipple. By physical examination this measures approximately 1 cm in size. There is no palpable left axillary adenopathy. Targeted ultrasound is performed, showing an irregularly marginated hypoechoic mass located within the left breast at the 1 o'clock position 6 cm from the nipple with internal calcifications and shadowing. The mass measures 1.2 x 0.9 x 0.7 cm in size and is suspicious for invasive mammary carcinoma. Ultrasound-guided core biopsy is recommended. Ultrasound of the left axilla demonstrates normal axillary contents and no evidence for adenopathy. IMPRESSION: 1.2 cm irregular marginated mass located within the left breast 1 o'clock position 6 cm the nipple. Tissue sampling is recommended. Ultrasound-guided core biopsy will be performed and reported separately. Stable probably benign calcifications scattered throughout the right breast. RECOMMENDATION: Left breast ultrasound-guided core biopsy. I have discussed the findings and recommendations with the patient. Results were also provided in writing at the conclusion of the visit. If applicable, a reminder letter will be sent to the patient regarding the next appointment. BI-RADS CATEGORY  5: Highly suggestive of malignancy. Electronically Signed   By: Altamese Cabal M.D.   On: 05/10/2016 13:00   Mm Diag Breast Tomo Bilateral  Result Date: 05/10/2016 CLINICAL DATA:  Patient was asked to return for a six-month follow-up evaluation of probably benign right breast calcifications on a 04/28/2015 diagnostic evaluation. The patient did not return. She now returns. The patient has a history of multiple sclerosis. EXAM: 2D DIGITAL DIAGNOSTIC BILATERAL MAMMOGRAM WITH CAD AND ADJUNCT TOMO ULTRASOUND RIGHT BREAST COMPARISON:  04/28/2015, 04/14/2015. ACR Breast Density Category c: The breast tissue is heterogeneously dense, which may obscure small masses. FINDINGS: There are numerous  grouped and scattered calcifications throughout the right breast. Several of these calcifications do layer. There are no worrisome linear or branching forms. These are stable when compared with the prior study. There are similar diffuse calcifications present within the left breast. The patient has developed an irregularly marginated mass within the upper-outer quadrant of the left breast at approximately the 1 o'clock position. There are several faint suspicious calcifications located centrally within this mass. The mass measures 1.2 cm in size by mammography. Mammographic images were processed with CAD. On physical exam, there is a firm palpable mass located within the left breast at the 1 o'clock position 6 cm from the nipple. By physical examination this measures approximately 1 cm in size. There is no palpable left axillary adenopathy. Targeted ultrasound  is performed, showing an irregularly marginated hypoechoic mass located within the left breast at the 1 o'clock position 6 cm from the nipple with internal calcifications and shadowing. The mass measures 1.2 x 0.9 x 0.7 cm in size and is suspicious for invasive mammary carcinoma. Ultrasound-guided core biopsy is recommended. Ultrasound of the left axilla demonstrates normal axillary contents and no evidence for adenopathy. IMPRESSION: 1.2 cm irregular marginated mass located within the left breast 1 o'clock position 6 cm the nipple. Tissue sampling is recommended. Ultrasound-guided core biopsy will be performed and reported separately. Stable probably benign calcifications scattered throughout the right breast. RECOMMENDATION: Left breast ultrasound-guided core biopsy. I have discussed the findings and recommendations with the patient. Results were also provided in writing at the conclusion of the visit. If applicable, a reminder letter will be sent to the patient regarding the next appointment. BI-RADS CATEGORY  5: Highly suggestive of malignancy.  Electronically Signed   By: Altamese Cabal M.D.   On: 05/10/2016 13:00   [place] Lt Breast Bx W Loc Dev 1st Lesion Img Bx Spec US Guide  Addendum Date: 05/13/2016   ADDENDUM REPORT: 05/12/2016 08:21 ADDENDUM: Pathology revealed grade I to II invasive ductal carcinoma and ductal carcinoma in situ with calcifications in the left breast. This was found to be concordant by Dr. Curlene Dolphin. Pathology results were discussed with the patient by telephone. The patient reported doing well after the biopsy with tenderness at the site. Post biopsy instructions and care were reviewed and questions were answered. The patient was encouraged to call [facility] for any additional concerns. Surgical consultation has been arranged with Dr. Fanny Skates at [facility] on May 13, 2016. Pathology results reported by Susa Raring RN, BSN on 05/12/2016. Electronically Signed   By: Curlene Dolphin M.D.   On: 05/12/2016 08:21   Result Date: 05/13/2016 CLINICAL DATA:  Suspicious palpable the 1 o'clock position of the left wall. Mass measures approximately 1.2 cm greatest diameter. EXAM: ULTRASOUND GUIDED LEFT BREAST CORE NEEDLE BIOPSY COMPARISON:  05/10/2016 FINDINGS: I met with the patient and we discussed the procedure of ultrasound-guided biopsy, including benefits and alternatives. We discussed the high likelihood of a successful procedure. We discussed the risks of the procedure, including infection, bleeding, tissue injury, clip migration, and inadequate sampling. Informed written consent was given. The usual time-out protocol was performed immediately prior to the procedure. Using sterile technique and 1% Lidocaine as local anesthetic, under direct ultrasound visualization, a 12 gauge spring-loaded device was used to perform biopsy of a 1.2 cm irregular hypoechoic mass at 1 o'clock position left breast 6 cm from the nipple using a lateral to medial approach. At the  conclusion of the procedure a coil tissue marker clip was deployed into the biopsy cavity. Follow up 2 view mammogram was performed and dictated separately. IMPRESSION: Ultrasound guided biopsy of the left breast. No apparent complications. Electronically Signed: By: Curlene Dolphin M.D. On: 05/10/2016 15:24      IMPRESSION/PLAN: Stage I left breast cancer  The consensus is that she would be a good candidate for breast conservation. I talked to her about the option of a mastectomy and informed her that her expected overall survival would be equivalent between mastectomy and breast conservation, based upon randomized controlled data. She is enthusiastic about breast conservation.  It was a pleasure meeting the patient today. We discussed the risks, benefits, and side effects of radiotherapy. I recommend radiotherapy to the LEFT BREAST to  reduce her risk of locoregional recurrence by 2/3.  We discussed that radiation would take approximately 4-6 weeks to complete and that I would give the patient a few weeks to heal following surgery before starting treatment planning. If chemotherapy were to be given, this would precede radiotherapy. We spoke about acute effects including skin irritation and fatigue as well as much less common late effects including internal organ injury or irritation. We spoke about the latest technology that is used to minimize the risk of late effects for patients undergoing radiotherapy to the breast or chest wall. No guarantees of treatment were given. The patient is enthusiastic about proceeding with treatment. The patient signed a consent form and this was placed in her medical chart. I look forward to participating in the patient's care.  The patient is scheduled for a left lumpectomy by Dr. Dalbert Batman on 06/07/16. She has a trip planned to [place] in October and would like to begin radiation after this trip. This is appropriate. The patient had questions regarding the flu shot and the  pneumonia shot. I advised her to contact her PCP regarding the immunizations. I do recommend a flu shot and defer to her PCP re: PNA vaccine.  Thyroid mass, previously followed by patient's PCP per records.  Note sent to prior PCP and current PCP in EPIC: "I will be [facility]'s radiation oncologist for her breast cancer.  When I met her today, I appreciated a right thyroid mass. I see that Belenda Cruise ordered an [place], and then ENT consult for FNA. Just making sure this was ultimately done, since I don't have access to the ENT notes and didn't see a path report. Patient cannot remember."   __________________________________________   Eppie Gibson, MD  This document serves as a record of services personally performed by Eppie Gibson, MD. It was created on her behalf by Darcus Austin, a trained medical scribe. The creation of this record is based on the scribe's personal observations and the provider's statements to them. This document has been checked and approved by the attending provider.

## 2016-06-01 ENCOUNTER — Ambulatory Visit (INDEPENDENT_AMBULATORY_CARE_PROVIDER_SITE_OTHER): Payer: BLUE CROSS/BLUE SHIELD | Admitting: *Deleted

## 2016-06-01 ENCOUNTER — Encounter (HOSPITAL_BASED_OUTPATIENT_CLINIC_OR_DEPARTMENT_OTHER): Payer: Self-pay | Admitting: *Deleted

## 2016-06-01 DIAGNOSIS — Z23 Encounter for immunization: Secondary | ICD-10-CM | POA: Diagnosis not present

## 2016-06-03 ENCOUNTER — Encounter: Payer: Self-pay | Admitting: *Deleted

## 2016-06-03 ENCOUNTER — Encounter (HOSPITAL_BASED_OUTPATIENT_CLINIC_OR_DEPARTMENT_OTHER)
Admission: RE | Admit: 2016-06-03 | Discharge: 2016-06-03 | Disposition: A | Discharge status: Home / Self Care | Payer: BLUE CROSS/BLUE SHIELD | Source: Ambulatory Visit | Attending: General Surgery | Admitting: General Surgery

## 2016-06-03 DIAGNOSIS — F329 Major depressive disorder, single episode, unspecified: Secondary | ICD-10-CM | POA: Diagnosis not present

## 2016-06-03 DIAGNOSIS — Z803 Family history of malignant neoplasm of breast: Secondary | ICD-10-CM | POA: Diagnosis not present

## 2016-06-03 DIAGNOSIS — C50412 Malignant neoplasm of upper-outer quadrant of left female breast: Secondary | ICD-10-CM | POA: Diagnosis not present

## 2016-06-03 DIAGNOSIS — Z87891 Personal history of nicotine dependence: Secondary | ICD-10-CM | POA: Diagnosis not present

## 2016-06-03 DIAGNOSIS — G35 Multiple sclerosis: Secondary | ICD-10-CM | POA: Diagnosis not present

## 2016-06-03 DIAGNOSIS — Z170 Estrogen receptor positive status [ER+]: Secondary | ICD-10-CM | POA: Diagnosis not present

## 2016-06-03 DIAGNOSIS — N3010 Interstitial cystitis (chronic) without hematuria: Secondary | ICD-10-CM | POA: Diagnosis not present

## 2016-06-03 DIAGNOSIS — G9050 Complex regional pain syndrome I, unspecified: Secondary | ICD-10-CM | POA: Diagnosis not present

## 2016-06-03 DIAGNOSIS — I10 Essential (primary) hypertension: Secondary | ICD-10-CM | POA: Diagnosis not present

## 2016-06-03 LAB — CBC WITH DIFFERENTIAL/PLATELET
BASOS PCT: 0 %
Basophils Absolute: 0 10*3/uL (ref 0.0–0.1)
Eosinophils Absolute: 0.1 10*3/uL (ref 0.0–0.7)
Eosinophils Relative: 3 %
HEMATOCRIT: 38.9 % (ref 36.0–46.0)
HEMOGLOBIN: 12.8 g/dL (ref 12.0–15.0)
LYMPHS ABS: 0.8 10*3/uL (ref 0.7–4.0)
LYMPHS PCT: 18 %
MCH: 31 pg (ref 26.0–34.0)
MCHC: 32.9 g/dL (ref 30.0–36.0)
MCV: 94.2 fL (ref 78.0–100.0)
MONO ABS: 0.6 10*3/uL (ref 0.1–1.0)
MONOS PCT: 13 %
NEUTROS ABS: 3 10*3/uL (ref 1.7–7.7)
NEUTROS PCT: 66 %
Platelets: 123 10*3/uL — ABNORMAL LOW (ref 150–400)
RBC: 4.13 MIL/uL (ref 3.87–5.11)
RDW: 13.4 % (ref 11.5–15.5)
WBC: 4.6 10*3/uL (ref 4.0–10.5)

## 2016-06-03 LAB — COMPREHENSIVE METABOLIC PANEL
ALBUMIN: 4.1 g/dL (ref 3.5–5.0)
ALK PHOS: 75 U/L (ref 38–126)
ALT: 16 U/L (ref 14–54)
ANION GAP: 6 (ref 5–15)
AST: 21 U/L (ref 15–41)
BILIRUBIN TOTAL: 0.7 mg/dL (ref 0.3–1.2)
BUN: 12 mg/dL (ref 6–20)
CALCIUM: 9.6 mg/dL (ref 8.9–10.3)
CO2: 30 mmol/L (ref 22–32)
Chloride: 105 mmol/L (ref 101–111)
Creatinine, Ser: 0.73 mg/dL (ref 0.44–1.00)
GFR calc Af Amer: >60 mL/min (ref >60)
GLUCOSE: 94 mg/dL (ref 65–99)
POTASSIUM: 4.1 mmol/L (ref 3.5–5.1)
Sodium: 141 mmol/L (ref 135–145)
TOTAL PROTEIN: 7.1 g/dL (ref 6.5–8.1)

## 2016-06-03 NOTE — Progress Notes (Signed)
Pt given a 8 oz carton of boost breeze with verbal and written instructions to drink at 615 am morning of surgery and no other liquid or food after midnight.  Teach back, pt voiced understanding

## 2016-06-03 NOTE — Progress Notes (Signed)
[place] Psychosocial Distress Screening Clinical Social Work  Clinical Social Work was referred by distress screening protocol.  The patient scored a 6 on the Psychosocial Distress Thermometer which indicates moderate distress. Clinical Social Worker phoned pt to assess for distress and other psychosocial needs. CSW spoke with pt at length via phone and reviewed role of CSW/Pt and Support Team. CSW reviewed coping techniques and common emotions. Pt reports she "had it all hit her today". CSW provided pt with supportive listening and redirection. Pt has good family support and several coping techniques that she reports work well for her. Pt has her anxiety and depression managed through her neurologist. She feels this is managed currently. Pt is very interested in support groups and other supports. CSW encouraged pt to reach out as needed.   ONCBCN DISTRESS SCREENING 05/28/2016  Screening Type Initial Screening  Distress experienced in past week (1-10) 6  Emotional problem type Depression;Nervousness/Anxiety;Adjusting to illness;Boredom  Spiritual/Religous concerns type Relating to God  Physical Problem type Pain;Loss of appetitie;Tingling hands/feet;Swollen arms/legs  Physician notified of physical symptoms Yes   Clinical Social Worker follow up needed: No.  If yes, follow up plan:  Loren Racer, [place]  [facility] Phone: [phone] Fax: [phone]

## 2016-06-04 ENCOUNTER — Ambulatory Visit
Admission: RE | Admit: 2016-06-04 | Discharge: 2016-06-04 | Disposition: A | Discharge status: Home / Self Care | Payer: BLUE CROSS/BLUE SHIELD | Source: Ambulatory Visit | Attending: General Surgery | Admitting: General Surgery

## 2016-06-04 DIAGNOSIS — C50412 Malignant neoplasm of upper-outer quadrant of left female breast: Secondary | ICD-10-CM

## 2016-06-06 NOTE — H&P (Signed)
Stacy Logan Location: [facility] Surgery Patient #: [id number] DOB: 12-27-1965 Single / Language: Stacy Logan / Race: White Female      History of Present Illness      The patient is a 50 year old female who presents with breast cancer. This is a pleasant 50 year old Caucasian female, referred by Dr. Curlene Dolphin at the breast center of [facility] for evaluation and management of a new invasive cancer of the left breast upper outer quadrant. Dr. Elyn Aquas is her PCP. Dr. Arlice Colt is her neurologist. Dr. Radene Knee is her gynecologist. Dr. Louis Meckel is her urologist. Her only daughter and one of her 2 sisters are with her today.     She gets annual mammograms. She has not had any significant breast problems. They have been following her for calcifications for several years which are felt to be benign. A month or 2 ago she felt a lump in her left breast, upper outer quadrant. Recent mammograms and ultrasound performed on August 21 showed category C density. Numerous calcifications throughout both breasts which are said to be benign. There was a 1.2 cm mass in the left breast upper outer quadrant, 6 cm from the nipple. This was said to be at 1:00 but is more like 2:30 position on exam. Ultrasound the axilla is negative.  Pathology report shows invasive ductal carcinoma and DCIS with calcifications. Grade 1-2. Breast diagnostic profile pending.     Comorbidities include multiple sclerosis, reflex sympathetic dystrophy affecting the hands especially, interstitial cystitis. History of C-section and tubal ligation. History of cervical disc surgery.     Family history reveals sister diagnosed with breast cancer age 36. Maternal aunt had breast cancer. A different maternal aunt had ovarian cancer and died of that disease.. Maternal grandfather had colon cancer. Brother died of lung cancer, was a smoker.     Socially she is divorced. One child which is a daughter that is here with  her today. Former smoker. She has 2 sisters, one of which is with her today.     I spent at least an hour going over all of these issues and examining the patient. She is motivated for breast conservation surgery, and I think she is a good candidate for that from an anatomic standpoint. We discussed lumpectomy, sentinel node biopsy, mastectomy with or without reconstruction, adjuvant radiation therapy, the possibility of chemotherapy. I discussed lumpectomy with radioactive seed localization and sentinel lobe biopsy in detail. We discussed the implications of different combinations of breast diagnostic profile. She is in favor of the genetic testing and states that she might want more aggressive risk reducing surgery if she has a genetic mutation. I told her that even with a deleterious mutation, bilateral mastectomy does not confer survival benefit, but could significantly reduce recurrence risk from second events.     In general she wants a left lumpectomy and sentinel node biopsy, and I think she is a good candidate for that. She called back later and requested breast conservation surgery, stating that genetics would not alter this decision.  I agree and support that decision and she will be scheduled.  We have referred her to see Dr. Lindi Adie - seen 05/21/2016 We have referred her to see Dr. Eppie Gibson - seen 05/28/2016 We have referred her to genetic counseling - blood drawn 05/28/2016    Addendum Note ER 100% PR 10% Ki-67 10% Her-2 negative   Other Problems Anxiety Disorder Back Pain Bladder Problems Breast Cancer Depression Migraine Headache Thyroid Disease  Past Surgical History Breast Biopsy Left. Colon Polyp Removal - Colonoscopy Lung Surgery Left. Spinal Surgery - Neck  Diagnostic Studies History  Colonoscopy 1-5 years ago Mammogram within last year Pap Smear 1-5 years ago  Allergies  Morphine Sulfate (Concentrate) *ANALGESICS - OPIOID* Demerol  *ANALGESICS - OPIOID* Codeine Phosphate *ANALGESICS - OPIOID*  Medication History Tecfidera (240MG Capsule DR, Oral) Active. Oxycodone-Acetaminophen (10-325MG Tablet, Oral as needed) Active. Amitriptyline HCl (25MG Tablet, Oral) Active. Baclofen (10MG Tablet, Oral) Active. Gabapentin (300MG Capsule, Oral) Active. Hydroxychloroquine Sulfate (200MG Tablet, Oral) Active. LamoTRIgine (200MG Tablet, Oral) Active. Naproxen (500MG Tablet, Oral) Active. ProAir HFA (108 (90 Base)MCG/ACT Aerosol Soln, Inhalation as needed) Active. SUMAtriptan Succinate (100MG Tablet, Oral) Active. ValACYclovir HCl (1GM Tablet, Oral) Active. Venlafaxine HCl ER (75MG Capsule ER 24HR, Oral) Active. DiazePAM (5MG Tablet, Oral as needed) Active. MethylPREDNISolone (4MG Tab Ther Pack, Oral) Active. Medications Reconciled  Social History  Alcohol use Remotely quit alcohol use. Caffeine use Carbonated beverages, Coffee. Illicit drug use Remotely quit drug use. Tobacco use Former smoker.  Family History  Arthritis Mother, Sister. Breast Cancer Sister. Cancer Father. Depression Sister. Hypertension Mother, Sister. Respiratory Condition Father. Thyroid problems Sister.  Pregnancy / Birth History  Age at menarche 31 years. Age of menopause <43 Gravida 1 Irregular periods Length (months) of breastfeeding 3-6 Maternal age 63-30 Para 1    Review of Systems  General Present- Appetite Loss and Weight Loss. Not Present- Chills, Fatigue, Fever, Night Sweats and Weight Gain. Skin Not Present- Change in Wart/Mole, Dryness, Hives, Jaundice, New Lesions, Non-Healing Wounds, Rash and Ulcer. HEENT Not Present- Earache, Hearing Loss, Hoarseness, Nose Bleed, Oral Ulcers, Ringing in the Ears, Seasonal Allergies, Sinus Pain, Sore Throat, Visual Disturbances, Wears glasses/contact lenses and Yellow Eyes. Respiratory Present- Snoring. Not Present- Bloody sputum, Chronic Cough, Difficulty  Breathing and Wheezing. Breast Present- Breast Mass. Not Present- Breast Pain, Nipple Discharge and Skin Changes. Cardiovascular Present- Leg Cramps. Not Present- Chest Pain, Difficulty Breathing Lying Down, Palpitations, Rapid Heart Rate, Shortness of Breath and Swelling of Extremities. Gastrointestinal Present- Constipation, Hemorrhoids and Nausea. Not Present- Abdominal Pain, Bloating, Bloody Stool, Change in Bowel Habits, Chronic diarrhea, Difficulty Swallowing, Excessive gas, Gets full quickly at meals, Indigestion, Rectal Pain and Vomiting. Female Genitourinary Present- Frequency and Urgency. Not Present- Nocturia, Painful Urination and Pelvic Pain. Musculoskeletal Present- Back Pain, Joint Pain, Joint Stiffness, Muscle Pain and Muscle Weakness. Not Present- Swelling of Extremities. Neurological Present- Decreased Memory, Headaches, Numbness, Tingling, Trouble walking and Weakness. Not Present- Fainting, Seizures and Tremor. Psychiatric Present- Anxiety and Depression. Not Present- Bipolar, Change in Sleep Pattern, Fearful and Frequent crying. Endocrine Present- Heat Intolerance. Not Present- Cold Intolerance, Excessive Hunger, Hair Changes, Hot flashes and New Diabetes. Hematology Present- Easy Bruising. Not Present- Blood Thinners, Excessive bleeding, Gland problems, HIV and Persistent Infections.  Vitals  Weight: 119 lb Height: 69in Body Surface Area: 1.66 m Body Mass Index: 17.57 kg/m  Temp.: 97.75F(Temporal)  Pulse: 73 (Regular)  BP: 126/78 (Sitting, Left Arm, Standard)    Physical Exam General Mental Status-Alert. General Appearance-Consistent with stated age. Hydration-Well hydrated. Voice-Normal. Note: Pleasant and cooperative. Mild anxiety, controlled and understandable. Mental status normal. BMI 17.   Head and Neck Head-normocephalic, atraumatic with no lesions or palpable masses. Trachea-midline. Thyroid Gland Characteristics - normal size  and consistency. Note: Left neck incision anterolaterally from prior cervical disc surgery. Well healed. No adenopathy   Eye Eyeball - Bilateral-Extraocular movements intact. Sclera/Conjunctiva - Bilateral-No scleral icterus.  Chest and Lung Exam Chest and lung exam  reveals -quiet, even and easy respiratory effort with no use of accessory muscles and on auscultation, normal breath sounds, no adventitious sounds and normal vocal resonance. Inspection Chest Wall - Normal. Back - normal.  Breast Note: Breasts are quite small. 34 A. some bruising left breast upper outer quadrant. Small palpable mass at 2:30 which is tender from recent biopsy. No other mass or skin changes in either breast. No nipple discharge. No axillary adenopathy.   Cardiovascular Cardiovascular examination reveals -normal heart sounds, regular rate and rhythm with no murmurs and normal pedal pulses bilaterally.  Abdomen Inspection Inspection of the abdomen reveals - No Hernias. Skin - Scar - Note: Transverse incision below umbilicus. Pfannenstiel incision. Well-healed no hernias. Palpation/Percussion Palpation and Percussion of the abdomen reveal - Soft, Non Tender, No Rebound tenderness, No Rigidity (guarding) and No hepatosplenomegaly. Auscultation Auscultation of the abdomen reveals - Bowel sounds normal.  Neurologic Neurologic evaluation reveals -alert and oriented x 3 with no impairment of recent or remote memory. Mental Status-Normal. Note: Unwilling to shake hands due to RSD, understandable   Musculoskeletal Normal Exam - Left-Upper Extremity Strength Normal and Lower Extremity Strength Normal. Normal Exam - Right-Upper Extremity Strength Normal and Lower Extremity Strength Normal. Note: Declines to shake hands because of RSD.   Lymphatic Head & Neck  General Head & Neck Lymphatics: Bilateral - Description - Normal. Axillary  General Axillary Region: Bilateral - Description -  Normal. Tenderness - Non Tender. Femoral & Inguinal  Generalized Femoral & Inguinal Lymphatics: Bilateral - Description - Normal. Tenderness - Non Tender.    Assessment & Plan  PRIMARY CANCER OF UPPER OUTER QUADRANT OF LEFT FEMALE BREAST (C50.412)  Pt Education - [facility] Breast Cancer Information Given - [employer] "Breast Journey" Package Follow up with [place] in the office in 3 WEEKS.  Your recent imaging studies and biopsy showed an invasive cancer of the left breast, upper outer quadrant. The cancer is 1.2 cm according to the x-rays and the lymph nodes on the arm do not look enlarged. The estrogen and progesterone receptors tests are pending. HER-2 test is pending.  We have discussed different surgical approaches including lumpectomy, sentinel lymph node biopsy, mastectomy with sentinel lymph node biopsy with or without reconstruction. You have stated that you would like to have breast conservation surgery, and I agree that you're a good candidate for that. Because of your family history of breast cancer you will be referred for genetic counseling and testing. You state that you are considering bilateral mastectomy, if you have a genetic mutation that increases your risk  ( ADDENDUM: Patient called and requested scheduling left PM and SLN biopsy.  Scheduled for 06/07/2016)  You will be referred to Dr. Jana Hakim for a preop medical oncology consultation You'll be referred to radiation oncology to discuss radiation therapy post lumpectomy You will be referred to genetic counseling immediately and hopefully we can get those results back within 3 weeks.  Please read over the information that we have given you.  MULTIPLE SCLEROSIS (G35) RSD (REFLEX SYMPATHETIC DYSTROPHY) (G90.50) Impression: Especially hands. Takes gabapentin. Followed by Dr. Arlice Colt at the neurology group INTERSTITIAL CYSTITIS (N30.10) FAMILY HISTORY OF BREAST CANCER IN FIRST DEGREE RELATIVE (Z80.3) Impression: Sister  diagnosed age 3. Maternal aunt. Another maternal aunt had ovarian cancer and died of that. FORMER SMOKER [phone]) DEPRESSION, CONTROLLED (F32.9)    M. Dalbert Batman, M.D., [facility] General and Minimally invasive Surgery Breast and Colorectal Surgery Office:   [phone] Pager:   [phone]

## 2016-06-07 ENCOUNTER — Ambulatory Visit (HOSPITAL_BASED_OUTPATIENT_CLINIC_OR_DEPARTMENT_OTHER): Payer: BLUE CROSS/BLUE SHIELD | Admitting: Anesthesiology

## 2016-06-07 ENCOUNTER — Encounter (HOSPITAL_BASED_OUTPATIENT_CLINIC_OR_DEPARTMENT_OTHER): Payer: Self-pay | Admitting: Anesthesiology

## 2016-06-07 ENCOUNTER — Ambulatory Visit (HOSPITAL_COMMUNITY)
Admission: RE | Admit: 2016-06-07 | Discharge: 2016-06-07 | Disposition: A | Discharge status: Home / Self Care | Payer: BLUE CROSS/BLUE SHIELD | Source: Ambulatory Visit | Attending: General Surgery | Admitting: General Surgery

## 2016-06-07 ENCOUNTER — Ambulatory Visit
Admission: RE | Admit: 2016-06-07 | Discharge: 2016-06-07 | Disposition: A | Discharge status: Home / Self Care | Payer: BLUE CROSS/BLUE SHIELD | Source: Ambulatory Visit | Attending: General Surgery | Admitting: General Surgery

## 2016-06-07 ENCOUNTER — Ambulatory Visit (HOSPITAL_BASED_OUTPATIENT_CLINIC_OR_DEPARTMENT_OTHER)
Admission: RE | Admit: 2016-06-07 | Discharge: 2016-06-07 | Disposition: A | Discharge status: Home / Self Care | Payer: BLUE CROSS/BLUE SHIELD | Source: Ambulatory Visit | Attending: General Surgery | Admitting: General Surgery

## 2016-06-07 ENCOUNTER — Encounter (HOSPITAL_BASED_OUTPATIENT_CLINIC_OR_DEPARTMENT_OTHER)
Admission: RE | Disposition: A | Discharge status: Home / Self Care | Payer: Self-pay | Source: Ambulatory Visit | Attending: General Surgery

## 2016-06-07 DIAGNOSIS — C50412 Malignant neoplasm of upper-outer quadrant of left female breast: Secondary | ICD-10-CM | POA: Diagnosis present

## 2016-06-07 DIAGNOSIS — G35 Multiple sclerosis: Secondary | ICD-10-CM | POA: Insufficient documentation

## 2016-06-07 DIAGNOSIS — Z170 Estrogen receptor positive status [ER+]: Secondary | ICD-10-CM | POA: Insufficient documentation

## 2016-06-07 DIAGNOSIS — N3010 Interstitial cystitis (chronic) without hematuria: Secondary | ICD-10-CM | POA: Insufficient documentation

## 2016-06-07 DIAGNOSIS — Z87891 Personal history of nicotine dependence: Secondary | ICD-10-CM | POA: Insufficient documentation

## 2016-06-07 DIAGNOSIS — F329 Major depressive disorder, single episode, unspecified: Secondary | ICD-10-CM | POA: Insufficient documentation

## 2016-06-07 DIAGNOSIS — I10 Essential (primary) hypertension: Secondary | ICD-10-CM | POA: Insufficient documentation

## 2016-06-07 DIAGNOSIS — G9050 Complex regional pain syndrome I, unspecified: Secondary | ICD-10-CM | POA: Insufficient documentation

## 2016-06-07 DIAGNOSIS — Z803 Family history of malignant neoplasm of breast: Secondary | ICD-10-CM | POA: Insufficient documentation

## 2016-06-07 HISTORY — DX: Malignant (primary) neoplasm, unspecified: C80.1

## 2016-06-07 HISTORY — PX: RADIOACTIVE SEED GUIDED PARTIAL MASTECTOMY WITH AXILLARY SENTINEL LYMPH NODE BIOPSY: SHX6520

## 2016-06-07 SURGERY — RADIOACTIVE SEED GUIDED PARTIAL MASTECTOMY WITH AXILLARY SENTINEL LYMPH NODE BIOPSY
Anesthesia: General | Site: Breast | Laterality: Left

## 2016-06-07 MED ORDER — EPHEDRINE SULFATE-NACL 50-0.9 MG/10ML-% IV SOSY
PREFILLED_SYRINGE | INTRAVENOUS | Status: DC | PRN
  Administered 2016-06-07: 10 mg via INTRAVENOUS
  Administered 2016-06-07 (×3): 5 mg via INTRAVENOUS
  Administered 2016-06-07: 10 mg via INTRAVENOUS

## 2016-06-07 MED ORDER — HYDROCODONE-ACETAMINOPHEN 5-325 MG PO TABS: 1.0000 | ORAL_TABLET | ORAL | 0 refills | Status: DC | PRN

## 2016-06-07 MED ORDER — LIDOCAINE 2% (20 MG/ML) 5 ML SYRINGE
INTRAMUSCULAR | Status: AC
  Filled 2016-06-07: qty 5

## 2016-06-07 MED ORDER — FENTANYL CITRATE (PF) 100 MCG/2ML IJ SOLN
INTRAMUSCULAR | Status: AC
  Filled 2016-06-07: qty 2

## 2016-06-07 MED ORDER — FENTANYL CITRATE (PF) 100 MCG/2ML IJ SOLN
25.0000 ug | INTRAMUSCULAR | Status: DC | PRN
  Administered 2016-06-07 (×2): 25 ug via INTRAVENOUS
  Administered 2016-06-07: 50 ug via INTRAVENOUS

## 2016-06-07 MED ORDER — CELECOXIB 200 MG PO CAPS
ORAL_CAPSULE | ORAL | Status: AC
  Filled 2016-06-07: qty 2

## 2016-06-07 MED ORDER — CEFAZOLIN SODIUM-DEXTROSE 2-4 GM/100ML-% IV SOLN
INTRAVENOUS | Status: AC
  Filled 2016-06-07: qty 100

## 2016-06-07 MED ORDER — CELECOXIB 400 MG PO CAPS
400.0000 mg | ORAL_CAPSULE | ORAL | Status: AC
  Administered 2016-06-07: 400 mg via ORAL

## 2016-06-07 MED ORDER — SCOPOLAMINE 1 MG/3DAYS TD PT72: 1.0000 | MEDICATED_PATCH | Freq: Once | TRANSDERMAL | Status: DC | PRN

## 2016-06-07 MED ORDER — GLYCOPYRROLATE 0.2 MG/ML IJ SOLN
0.2000 mg | Freq: Once | INTRAMUSCULAR | Status: DC | PRN
Start: 2016-06-07 — End: 2016-06-07

## 2016-06-07 MED ORDER — MIDAZOLAM HCL 2 MG/2ML IJ SOLN
INTRAMUSCULAR | Status: AC
  Filled 2016-06-07: qty 2

## 2016-06-07 MED ORDER — CHLORHEXIDINE GLUCONATE CLOTH 2 % EX PADS: 6.0000 | MEDICATED_PAD | Freq: Once | CUTANEOUS | Status: DC

## 2016-06-07 MED ORDER — PROPOFOL 10 MG/ML IV BOLUS
INTRAVENOUS | Status: DC | PRN
  Administered 2016-06-07: 100 mg via INTRAVENOUS
  Administered 2016-06-07: 200 mg via INTRAVENOUS

## 2016-06-07 MED ORDER — DEXAMETHASONE SODIUM PHOSPHATE 4 MG/ML IJ SOLN
INTRAMUSCULAR | Status: DC | PRN
  Administered 2016-06-07: 10 mg via INTRAVENOUS

## 2016-06-07 MED ORDER — ONDANSETRON HCL 4 MG/2ML IJ SOLN
INTRAMUSCULAR | Status: DC | PRN
  Administered 2016-06-07: 4 mg via INTRAVENOUS

## 2016-06-07 MED ORDER — GABAPENTIN 300 MG PO CAPS: 300.0000 mg | ORAL_CAPSULE | ORAL | Status: DC

## 2016-06-07 MED ORDER — HYDROCODONE-ACETAMINOPHEN 5-325 MG PO TABS
1.0000 | ORAL_TABLET | Freq: Once | ORAL | Status: AC | PRN
Start: 2016-06-07 — End: 2016-06-07
  Administered 2016-06-07: 1 via ORAL

## 2016-06-07 MED ORDER — LIDOCAINE 2% (20 MG/ML) 5 ML SYRINGE
INTRAMUSCULAR | Status: DC | PRN
  Administered 2016-06-07: 60 mg via INTRAVENOUS

## 2016-06-07 MED ORDER — 0.9 % SODIUM CHLORIDE (POUR BTL) OPTIME
TOPICAL | Status: DC | PRN
  Administered 2016-06-07: 400 mL

## 2016-06-07 MED ORDER — FENTANYL CITRATE (PF) 100 MCG/2ML IJ SOLN
50.0000 ug | INTRAMUSCULAR | Status: DC | PRN
  Administered 2016-06-07: 100 ug via INTRAVENOUS
  Administered 2016-06-07: 50 ug via INTRAVENOUS

## 2016-06-07 MED ORDER — LACTATED RINGERS IV SOLN
INTRAVENOUS | Status: DC
  Administered 2016-06-07 (×2): via INTRAVENOUS

## 2016-06-07 MED ORDER — DEXAMETHASONE SODIUM PHOSPHATE 10 MG/ML IJ SOLN
INTRAMUSCULAR | Status: AC
  Filled 2016-06-07: qty 1

## 2016-06-07 MED ORDER — TECHNETIUM TC 99M SULFUR COLLOID FILTERED
1.0000 | Freq: Once | INTRAVENOUS | Status: AC | PRN
  Administered 2016-06-07: 1 via INTRADERMAL

## 2016-06-07 MED ORDER — HYDROMORPHONE HCL 1 MG/ML IJ SOLN: 0.2500 mg | INTRAMUSCULAR | Status: DC | PRN

## 2016-06-07 MED ORDER — PROMETHAZINE HCL 25 MG/ML IJ SOLN: 6.2500 mg | INTRAMUSCULAR | Status: DC | PRN

## 2016-06-07 MED ORDER — SODIUM CHLORIDE 0.9 % IJ SOLN
INTRAVENOUS | Status: DC | PRN
  Administered 2016-06-07: 5 mL via INTRAMUSCULAR

## 2016-06-07 MED ORDER — ACETAMINOPHEN 500 MG PO TABS
ORAL_TABLET | ORAL | Status: AC
  Filled 2016-06-07: qty 2

## 2016-06-07 MED ORDER — BUPIVACAINE-EPINEPHRINE (PF) 0.5% -1:200000 IJ SOLN
INTRAMUSCULAR | Status: DC | PRN
  Administered 2016-06-07: 13 mL

## 2016-06-07 MED ORDER — ACETAMINOPHEN 500 MG PO TABS
1000.0000 mg | ORAL_TABLET | ORAL | Status: AC
  Administered 2016-06-07: 1000 mg via ORAL

## 2016-06-07 MED ORDER — MIDAZOLAM HCL 2 MG/2ML IJ SOLN
1.0000 mg | INTRAMUSCULAR | Status: DC | PRN
  Administered 2016-06-07: 1 mg via INTRAVENOUS
  Administered 2016-06-07: 2 mg via INTRAVENOUS

## 2016-06-07 MED ORDER — CEFAZOLIN SODIUM-DEXTROSE 2-4 GM/100ML-% IV SOLN
2.0000 g | INTRAVENOUS | Status: AC
  Administered 2016-06-07: 2 g via INTRAVENOUS

## 2016-06-07 MED ORDER — HYDROCODONE-ACETAMINOPHEN 5-325 MG PO TABS
ORAL_TABLET | ORAL | Status: AC
  Filled 2016-06-07: qty 1

## 2016-06-07 MED ORDER — PHENYLEPHRINE 40 MCG/ML (10ML) SYRINGE FOR IV PUSH (FOR BLOOD PRESSURE SUPPORT)
PREFILLED_SYRINGE | INTRAVENOUS | Status: DC | PRN
  Administered 2016-06-07: 120 ug via INTRAVENOUS
  Administered 2016-06-07: 80 ug via INTRAVENOUS

## 2016-06-07 MED ORDER — ONDANSETRON HCL 4 MG/2ML IJ SOLN
INTRAMUSCULAR | Status: AC
  Filled 2016-06-07: qty 2

## 2016-06-07 MED ORDER — BUPIVACAINE-EPINEPHRINE (PF) 0.5% -1:200000 IJ SOLN
INTRAMUSCULAR | Status: DC | PRN
  Administered 2016-06-07: 30 mL

## 2016-06-07 SURGICAL SUPPLY — 54 items
ADH SKN CLS APL DERMABOND .7 (GAUZE/BANDAGES/DRESSINGS) ×1
APPLIER CLIP 9.375 MED OPEN (MISCELLANEOUS) ×2
APR CLP MED 9.3 20 MLT OPN (MISCELLANEOUS) ×1
BINDER BREAST MEDIUM (GAUZE/BANDAGES/DRESSINGS) ×1 IMPLANT
BLADE HEX COATED 2.75 (ELECTRODE) ×2 IMPLANT
BLADE SURG 15 STRL LF DISP TIS (BLADE) ×1 IMPLANT
BLADE SURG 15 STRL SS (BLADE) ×2
CANISTER SUCT 1200ML W/VALVE (MISCELLANEOUS) ×2 IMPLANT
CHLORAPREP W/TINT 26ML (MISCELLANEOUS) ×2 IMPLANT
CLIP APPLIE 9.375 MED OPEN (MISCELLANEOUS) ×1 IMPLANT
COVER BACK TABLE 60X90IN (DRAPES) ×2 IMPLANT
COVER MAYO STAND STRL (DRAPES) ×3 IMPLANT
COVER PROBE W GEL 5X96 (DRAPES) ×2 IMPLANT
DERMABOND ADVANCED (GAUZE/BANDAGES/DRESSINGS) ×1
DERMABOND ADVANCED .7 DNX12 (GAUZE/BANDAGES/DRESSINGS) ×1 IMPLANT
DEVICE DUBIN W/COMP PLATE 8390 (MISCELLANEOUS) ×2 IMPLANT
DRAPE LAPAROSCOPIC ABDOMINAL (DRAPES) ×2 IMPLANT
DRAPE UTILITY XL STRL (DRAPES) ×2 IMPLANT
DRSG PAD ABDOMINAL 8X10 ST (GAUZE/BANDAGES/DRESSINGS) ×1 IMPLANT
ELECT REM PT RETURN 9FT ADLT (ELECTROSURGICAL) ×2
ELECTRODE REM PT RTRN 9FT ADLT (ELECTROSURGICAL) ×1 IMPLANT
GAUZE SPONGE 4X4 12PLY STRL (GAUZE/BANDAGES/DRESSINGS) ×1 IMPLANT
GLOVE BIO SURGEON STRL SZ 6.5 (GLOVE) ×1 IMPLANT
GLOVE BIOGEL PI IND STRL 6.5 (GLOVE) IMPLANT
GLOVE BIOGEL PI INDICATOR 6.5 (GLOVE) ×2
GLOVE EUDERMIC 7 POWDERFREE (GLOVE) ×4 IMPLANT
GOWN STRL REUS W/ TWL LRG LVL3 (GOWN DISPOSABLE) ×2 IMPLANT
GOWN STRL REUS W/ TWL XL LVL3 (GOWN DISPOSABLE) ×1 IMPLANT
GOWN STRL REUS W/TWL LRG LVL3 (GOWN DISPOSABLE) ×2
GOWN STRL REUS W/TWL XL LVL3 (GOWN DISPOSABLE) ×2
KIT MARKER MARGIN INK (KITS) ×2 IMPLANT
NDL HYPO 25X1 1.5 SAFETY (NEEDLE) ×2 IMPLANT
NDL SAFETY ECLIPSE 18X1.5 (NEEDLE) ×1 IMPLANT
NEEDLE HYPO 18GX1.5 SHARP (NEEDLE) ×2
NEEDLE HYPO 25X1 1.5 SAFETY (NEEDLE) ×6 IMPLANT
NS IRRIG 1000ML POUR BTL (IV SOLUTION) ×2 IMPLANT
PACK BASIN DAY SURGERY FS (CUSTOM PROCEDURE TRAY) ×2 IMPLANT
PENCIL BUTTON HOLSTER BLD 10FT (ELECTRODE) ×2 IMPLANT
SHEET MEDIUM DRAPE 40X70 STRL (DRAPES) ×1 IMPLANT
SLEEVE SCD COMPRESS KNEE MED (MISCELLANEOUS) ×2 IMPLANT
SPONGE LAP 4X18 X RAY DECT (DISPOSABLE) ×3 IMPLANT
SUT MNCRL AB 4-0 PS2 18 (SUTURE) ×3 IMPLANT
SUT SILK 2 0 SH (SUTURE) ×2 IMPLANT
SUT VIC AB 2-0 CT1 27 (SUTURE)
SUT VIC AB 2-0 CT1 TAPERPNT 27 (SUTURE) IMPLANT
SUT VIC AB 3-0 SH 27 (SUTURE)
SUT VIC AB 3-0 SH 27X BRD (SUTURE) IMPLANT
SUT VICRYL 3-0 CR8 SH (SUTURE) ×2 IMPLANT
SYR BULB IRRIGATION 50ML (SYRINGE) ×1 IMPLANT
SYRINGE 10CC LL (SYRINGE) ×4 IMPLANT
TOWEL OR 17X24 6PK STRL BLUE (TOWEL DISPOSABLE) ×2 IMPLANT
TOWEL OR NON WOVEN STRL DISP B (DISPOSABLE) ×2 IMPLANT
TUBE CONNECTING 20X1/4 (TUBING) ×2 IMPLANT
YANKAUER SUCT BULB TIP NO VENT (SUCTIONS) ×2 IMPLANT

## 2016-06-07 NOTE — Interval H&P Note (Signed)
History and Physical Interval Note:  06/07/2016 8:53 AM  Stacy Logan  has presented today for surgery, with the diagnosis of LEFT BREAST CANCER UPPER OUTER QUADRANT  The various methods of treatment have been discussed with the patient and family. After consideration of risks, benefits and other options for treatment, the patient has consented to  Procedure(s): BREAST LUMPECTOMY WITH RADIOACTIVE SEED AND SENTINEL LYMPH NODE BIOPSY, INJECT BLUE DYE LEFT BREAST (Left) as a surgical intervention .  The patient's history has been reviewed, patient examined, no change in status, stable for surgery.  I have reviewed the patient's chart and labs.  Questions were answered to the patient's satisfaction.     Adin Hector

## 2016-06-07 NOTE — Anesthesia Postprocedure Evaluation (Signed)
Anesthesia Post Note  Patient: ASENAT EZEKIEL  Procedure(s) Performed: Procedure(s) (LRB): BREAST LUMPECTOMY WITH RADIOACTIVE SEED AND SENTINEL LYMPH NODE BIOPSY, INJECT BLUE DYE LEFT BREAST (Left)  Patient location during evaluation: PACU Anesthesia Type: General Level of consciousness: sedated Pain management: pain level controlled Vital Signs Assessment: post-procedure vital signs reviewed and stable Respiratory status: spontaneous breathing and respiratory function stable Cardiovascular status: stable Anesthetic complications: no    Last Vitals:  Vitals:   06/07/16 1245 06/07/16 1255  BP: 116/73 116/69  Pulse: 83 85  Resp: 14 (!) 30  Temp:      Last Pain:  Vitals:   06/07/16 1255  TempSrc:   PainSc: 5                  Ryot Burrous DANIEL

## 2016-06-07 NOTE — Op Note (Signed)
Patient Name:           Stacy Logan   Date of Surgery:        06/07/2016  Pre op Diagnosis:      Invasive ductal carcinoma left breast, upper outer quadrant, clinical stage TIc, N0  Post op Diagnosis:    Same  Procedure:                 Inject blue dye left breast, left breast lumpectomy with radioactive seed localization, left axillary sentinel node biopsy  Surgeon:                     Edsel Petrin. Dalbert Batman, M.D., FACS  Assistant:                      None   Indication for Assistant: n/a  Operative Indications:      The patient is a 50 year old female who presents with breast cancer. This is a pleasant 50 year old Caucasian female, referred by Dr. Curlene Dolphin at the breast center of [facility] for evaluation and management of a new invasive cancer of the left breast upper outer quadrant. Dr. Elyn Aquas is her PCP. Dr. Arlice Colt is her neurologist. Dr. Radene Knee is her gynecologist. Dr. Louis Meckel is her urologist. Her only daughter and one of her 2 sisters are with her today.     She gets annual mammograms. She has not had any significant breast problems. They have been following her for calcifications for several years which are felt to be benign. A month or 2 ago she felt a lump in her left breast, upper outer quadrant. Recent mammograms and ultrasound performed on August 21 showed category C density. Numerous calcifications throughout both breasts which are said to be benign. There was a 1.2 cm mass in the left breast upper outer quadrant, 6 cm from the nipple. This was said to be at 1:00 but is more like 2:30 position on exam. Ultrasound the axilla is negative.      Pathology report shows invasive ductal carcinoma and DCIS with calcifications. Grade 1-2.  Estrogen receptor positive, HER-2 negative..     Comorbidities include multiple sclerosis, reflex sympathetic dystrophy affecting the hands especially, interstitial cystitis. History of C-section and tubal ligation. History  of cervical disc surgery.     Family history reveals sister diagnosed with breast cancer age 27. Maternal aunt had breast cancer. A different maternal aunt had ovarian cancer and died of that disease.. Maternal grandfather had colon cancer. Brother died of lung cancer, was a smoker.     Socially she is divorced. One child which is a daughter that is here with her today. Former smoker. She has 2 sisters, one of which is with her today.     I spent at least an hour going over all of these issues and examining the patient. She is motivated for breast conservation surgery, and I think she is a good candidate for that from an anatomic standpoint. We discussed lumpectomy, sentinel node biopsy, mastectomy with or without reconstruction, adjuvant radiation therapy, the possibility of chemotherapy. I discussed lumpectomy with radioactive seed localization and sentinel lobe biopsy in detail. We discussed the implications of different combinations of breast diagnostic profile. She is in favor of the genetic testing and states that she might want more aggressive risk reducing surgery if she has a genetic mutation. I told her that even with a deleterious mutation, bilateral mastectomy does not confer survival  benefit, but could significantly reduce recurrence risk from second events.  She has seen Dr. Lindi Adie from medical oncology and Dr. Isidore Moos from radiation oncology.  She has had blood drawn for genetic testing.     In general she wants a left lumpectomy and sentinel node biopsy, and I think she is a good candidate for that. She called back later and requested breast conservation surgery, stating that genetics would not alter this decision.  I agree and support that decision and she will be scheduled.    Operative Findings:       The radioactive seed was placed by radiology last week.  The images show that the radioactive seed is within 1 cm of the biopsy marker clip.  The breasts are very small.  The  distance between the skin and the pectoralis major muscle in the upper outer quadrant of the left breast is no more than 2 cm.  Therefore the anterior margin of the lumpectomy cavity is the skin,  the posterior margin of the lumpectomy specimen is the pectoralis muscle, and the lateral margin is the axilla.  The radioactive seed was audible in the holding area.  The specimen mammogram looked good, showing the biopsy clip and radio active seed within the specimen.  I found 2 sentinel lymph nodes.  She underwent injection of technetium 99 by the nuclear medicine technician in the holding area.  She had a left pectoral block performed by the anesthesia department.  Procedure in Detail:          Following the induction of general LMA anesthesia a surgical timeout was performed.  Following alcohol  prep I injected 5 mL of dilute methylene blue into the left breast and massaged breast for a few minutes.  The left breast and axilla were then prepped and draped in a sterile fashion.  Intravenous antibiotics were given.  0.5% Marcaine with epinephrine was used as local infiltration anesthetic into the skin and superficial subcutaneous tissues.     Using the neoprobe I identified the area of maximum radioactivity in the left breast.  Upper outer quadrant.  I made a curvilinear circumareolar type incision in this location.  I dissected down and around the radioactivity as described above.  The specimen was removed and marked with silk sutures and a 6 color ink kit To orient the pathologist.  Specimen mammogram looked good as described above.  This wound was irrigated with saline.  Hemostasis excellent.  I placed 5 metallic marker clips in the 5 cardinal positions of the lumpectomy cavity.  The breast tissues were closed with interrupted sutures of 3-0 Vicryl, although there was almost no breast tissue.  I closed down the space to the pectoralis muscle with sutures.  The skin was closed with a running subcuticular suture of  4-0 Monocryl and Dermabond.      The neoprobe was set to the technetium setting.  I could hear radioactivity in the axilla.  A transverse incision was made at the hairline of the left axilla.  Dissection was carried down into the axillary space and I found 2 obvious sentinel lymph nodes, both blue and radioactive and somewhat enlarged but soft.  After these 2 lymph nodes were removed there was no more radioactivity or blue dye.  Hemostasis excellent and achieved electrocautery.  Clavipectoral fascia was closed with 3-0 Vicryl sutures and the skin closed with a running subcuticular 4-0 Monocryl and Dermabond.      Try conditioning bandages and a breast binder were placed.  Patient tolerated the procedure well and was taken to PACU in stable condition.  EBL 20 mL.  Counts correct.  Complications none.     Edsel Petrin. Dalbert Batman, M.D., FACS General and Minimally Invasive Surgery Breast and Colorectal Surgery  06/07/2016 11:20 AM

## 2016-06-07 NOTE — Anesthesia Procedure Notes (Addendum)
Anesthesia Regional Block:  Pectoralis block  Pre-Anesthetic Checklist: ,, timeout performed, Correct Patient, Correct Site, Correct Laterality, Correct Procedure, Correct Position, site marked, Risks and benefits discussed,  Surgical consent,  Pre-op evaluation,  At surgeon's request and post-op pain management  Laterality: Left  Prep: chloraprep       Needles:  Injection technique: Single-shot  Needle Type: Echogenic Stimulator Needle     Needle Length: 10cm 10 cm Needle Gauge: 21 and 21 G    Additional Needles:  Procedures: ultrasound guided (picture in chart) Pectoralis block Narrative:  Start time: 06/07/2016 9:08 AM End time: 06/07/2016 9:18 AM Injection made incrementally with aspirations every 5 mL.  Performed by: Personally

## 2016-06-07 NOTE — Anesthesia Procedure Notes (Signed)
Procedure Name: LMA Insertion Date/Time: 06/07/2016 10:20 AM Performed by: Bethena Roys T Pre-anesthesia Checklist: Patient identified, Emergency Drugs available, Suction available and Patient being monitored Patient Re-evaluated:Patient Re-evaluated prior to inductionOxygen Delivery Method: Circle system utilized Preoxygenation: Pre-oxygenation with 100% oxygen Intubation Type: IV induction Ventilation: Mask ventilation without difficulty LMA: LMA inserted LMA Size: 4.0 Number of attempts: 1 Airway Equipment and Method: Bite block Placement Confirmation: positive ETCO2 Tube secured with: Tape Dental Injury: Teeth and Oropharynx as per pre-operative assessment  Comments: Mouth opening small

## 2016-06-07 NOTE — Discharge Instructions (Signed)
[facility] °Office Phone Number [phone] ° °BREAST BIOPSY/ PARTIAL MASTECTOMY: POST OP INSTRUCTIONS ° °Always review your discharge instruction sheet given to you by the facility where your surgery was performed. ° °IF YOU HAVE DISABILITY OR FAMILY LEAVE FORMS, YOU MUST BRING THEM TO THE OFFICE FOR PROCESSING.  DO NOT GIVE THEM TO YOUR DOCTOR. ° °1. A prescription for pain medication may be given to you upon discharge.  Take your pain medication as prescribed, if needed.  If narcotic pain medicine is not needed, then you may take acetaminophen (Tylenol) or ibuprofen (Advil) as needed. °2. Take your usually prescribed medications unless otherwise directed °3. If you need a refill on your pain medication, please contact your pharmacy.  They will contact our office to request authorization.  Prescriptions will not be filled after 5pm or on week-ends. °4. You should eat very light the first 24 hours after surgery, such as soup, crackers, pudding, etc.  Resume your normal diet the day after surgery. °5. Most patients will experience some swelling and bruising in the breast.  Ice packs and a good support bra will help.  Swelling and bruising can take several days to resolve.  °6. It is common to experience some constipation if taking pain medication after surgery.  Increasing fluid intake and taking a stool softener will usually help or prevent this problem from occurring.  A mild laxative (Milk of Magnesia or Miralax) should be taken according to package directions if there are no bowel movements after 48 hours. °7. Unless discharge instructions indicate otherwise, you may remove your bandages 24-48 hours after surgery, and you may shower at that time.  You may have steri-strips (small skin tapes) in place directly over the incision.  These strips should be left on the skin for 7-10 days.  If your surgeon used skin glue on the incision, you may shower in 24 hours.  The glue will flake off over the  next 2-3 weeks.  Any sutures or staples will be removed at the office during your follow-up visit. °8. ACTIVITIES:  You may resume regular daily activities (gradually increasing) beginning the next day.  Wearing a good support bra or sports bra minimizes pain and swelling.  You may have sexual intercourse when it is comfortable. °a. You may drive when you no longer are taking prescription pain medication, you can comfortably wear a seatbelt, and you can safely maneuver your car and apply brakes. °b. RETURN TO WORK:  ______________________________________________________________________________________ °9. You should see your doctor in the office for a follow-up appointment approximately two weeks after your surgery.  Your doctor’s nurse will typically make your follow-up appointment when she calls you with your pathology report.  Expect your pathology report 2-3 business days after your surgery.  You may call to check if you do not hear from us after three days. °10. OTHER INSTRUCTIONS: _______________________________________________________________________________________________ _____________________________________________________________________________________________________________________________________ °_____________________________________________________________________________________________________________________________________ °_____________________________________________________________________________________________________________________________________ ° °WHEN TO CALL YOUR DOCTOR: °1. Fever over 101.0 °2. Nausea and/or vomiting. °3. Extreme swelling or bruising. °4. Continued bleeding from incision. °5. Increased pain, redness, or drainage from the incision. ° °The clinic staff is available to answer your questions during regular business hours.  Please don’t hesitate to call and ask to speak to one of the nurses for clinical concerns.  If you have a medical emergency, go to the nearest  emergency room or call 911.  A surgeon from [facility] is always on call at the hospital. ° °For further questions, please visit [email or web address]  ° ° ° °  Post Anesthesia Home Care Instructions ° °Activity: °Get plenty of rest for the remainder of the day. A responsible adult should stay with you for 24 hours following the procedure.  °For the next 24 hours, DO NOT: °-Drive a car °-Operate machinery °-Drink alcoholic beverages °-Take any medication unless instructed by your physician °-Make any legal decisions or sign important papers. ° °Meals: °Start with liquid foods such as gelatin or soup. Progress to regular foods as tolerated. Avoid greasy, spicy, heavy foods. If nausea and/or vomiting occur, drink only clear liquids until the nausea and/or vomiting subsides. Call your physician if vomiting continues. ° °Special Instructions/Symptoms: °Your throat may feel dry or sore from the anesthesia or the breathing tube placed in your throat during surgery. If this causes discomfort, gargle with warm salt water. The discomfort should disappear within 24 hours. ° °If you had a scopolamine patch placed behind your ear for the management of post- operative nausea and/or vomiting: ° °1. The medication in the patch is effective for 72 hours, after which it should be removed.  Wrap patch in a tissue and discard in the trash. Wash hands thoroughly with soap and water. °2. You may remove the patch earlier than 72 hours if you experience unpleasant side effects which may include dry mouth, dizziness or visual disturbances. °3. Avoid touching the patch. Wash your hands with soap and water after contact with the patch. °  ° °

## 2016-06-07 NOTE — Progress Notes (Signed)
Assisted Dr. Singer with left, ultrasound guided, pectoralis block. Side rails up, monitors on throughout procedure. See vital signs in flow sheet. Tolerated Procedure well. °

## 2016-06-07 NOTE — Anesthesia Preprocedure Evaluation (Addendum)
Anesthesia Evaluation  Patient identified by MRN, date of birth, ID band Patient awake    Reviewed: Allergy & Precautions, H&P , NPO status , Patient's Chart, lab work & pertinent test results, reviewed documented beta blocker date and time   Airway Mallampati: II  TM Distance: >3 FB Neck ROM: full    Dental  (+) Dental Advisory Given, Chipped Irregular surfaces on front teeth:   Pulmonary former smoker,    breath sounds clear to auscultation       Cardiovascular Exercise Tolerance: Good hypertension, Normal cardiovascular exam Rhythm:regular Rate:Normal     Neuro/Psych  Headaches, PSYCHIATRIC DISORDERS Anxiety Depression MS    GI/Hepatic   Endo/Other    Renal/GU      Musculoskeletal   Abdominal   Peds  Hematology   Anesthesia Other Findings   Reproductive/Obstetrics                            Anesthesia Physical  Anesthesia Plan  ASA: II  Anesthesia Plan: General ETT   Post-op Pain Management: GA combined w/ Regional for post-op pain   Induction: Intravenous  Airway Management Planned: LMA  Additional Equipment:   Intra-op Plan:   Post-operative Plan: Extubation in OR  Informed Consent: I have reviewed the patients History and Physical, chart, labs and discussed the procedure including the risks, benefits and alternatives for the proposed anesthesia with the patient or authorized representative who has indicated his/her understanding and acceptance.   Dental Advisory Given  Plan Discussed with: CRNA and Surgeon  Anesthesia Plan Comments:         Anesthesia Quick Evaluation

## 2016-06-07 NOTE — Transfer of Care (Signed)
Immediate Anesthesia Transfer of Care Note  Patient: Stacy Logan  Procedure(s) Performed: Procedure(s): BREAST LUMPECTOMY WITH RADIOACTIVE SEED AND SENTINEL LYMPH NODE BIOPSY, INJECT BLUE DYE LEFT BREAST (Left)  Patient Location: PACU  Anesthesia Type:General  Level of Consciousness: awake, alert  and oriented  Airway & Oxygen Therapy: Patient Spontanous Breathing and Patient connected to face mask oxygen  Post-op Assessment: Report given to RN  Post vital signs: Reviewed and stable  Last Vitals: 150/84, 82, 16, 100%, 97.5 Vitals:   06/07/16 0945 06/07/16 1000  BP: 114/61 (!) 144/87  Pulse: 89   Resp: 20 15  Temp:      Last Pain:  Vitals:   06/07/16 0827  TempSrc: Oral         Complications: No apparent anesthesia complications

## 2016-06-08 ENCOUNTER — Encounter (HOSPITAL_BASED_OUTPATIENT_CLINIC_OR_DEPARTMENT_OTHER): Payer: Self-pay | Admitting: General Surgery

## 2016-06-08 NOTE — Addendum Note (Signed)
Addendum  created 06/08/16 [id number] by Ernesta Amble Enedina Pair, CRNA   Charge Capture section accepted

## 2016-06-08 NOTE — Progress Notes (Signed)
Inform patient of Pathology report,. Tried to call her this evening but did not answer and no voicemail.  Tell her that her left breast cancer was 1.1 cm in diameter.  Negative margins sentinel nodes negative.  This is a stage I cancer..  This is excellent news and she will not need further surgery.  I will discuss in detail at the next office visit  Let me know you contacted her.  hmi

## 2016-06-11 ENCOUNTER — Telehealth: Payer: Self-pay | Admitting: *Deleted

## 2016-06-11 ENCOUNTER — Telehealth: Payer: Self-pay | Admitting: Genetic Counselor

## 2016-06-11 NOTE — Telephone Encounter (Signed)
Discussed with Ms. Giltner that her genetic test results were negative for mutations within any of 33 genes on the Custom Panel (Breast, Gyn, GI genes plus FLCN due to her personal history of pneumothorax) through [employer].  Additionally, no variants of uncertain significance (VUSes) were found.  Discussed that most cancer is not genetic and that this result suggests that this is the case for Ms. Hartshorn cancer.  However, also suggested testing for her sister (diagnosed with breast cancer at 19) and for her paternal first cousin (diagnosed with breast cancer in her 8s) as they could have a mutation that maybe Ms. Phou herself just did not inherit.  Recommended that she continue to follow her doctors' recommendations for future cancer screening.  Her sister who has not had cancer has a much higher risk for breast cancer because she now has two sisters who have been diagnosed; she should let her doctor know about this family history because she is likely eligible for breast MRIs.  Recommended that Ms. Mukherjee daughter and niece begin mammogram screening at age 72.  Ms. Obryan is welcome to call with any questions she may have.  She lets me know that her surgery went well although she has been bored not being able to do many activities.  I will mail her a copy of her results, per her request.

## 2016-06-11 NOTE — Telephone Encounter (Signed)
  Oncology Nurse Navigator Documentation  Navigator Location: [facility] (06/11/16 1200) Navigator Encounter Type: Telephone (06/11/16 1200) Telephone: Outgoing Call;Patient Update (06/11/16 1200)  pt called to relate she is feeling "good" after sx. No complaints or needs at this time. Encourage pt to call with questions.   Surgery Date: 06/01/16 (06/11/16 1200) Treatment Initiated Date: 06/01/16 (06/11/16 1200)                                Time Spent with Patient: 15 (06/11/16 1200)

## 2016-06-14 ENCOUNTER — Encounter: Payer: Self-pay | Admitting: Hematology and Oncology

## 2016-06-14 ENCOUNTER — Ambulatory Visit (HOSPITAL_BASED_OUTPATIENT_CLINIC_OR_DEPARTMENT_OTHER): Payer: BLUE CROSS/BLUE SHIELD | Admitting: Hematology and Oncology

## 2016-06-14 ENCOUNTER — Ambulatory Visit: Payer: Self-pay | Admitting: Genetic Counselor

## 2016-06-14 DIAGNOSIS — Z170 Estrogen receptor positive status [ER+]: Secondary | ICD-10-CM

## 2016-06-14 DIAGNOSIS — Z1379 Encounter for other screening for genetic and chromosomal anomalies: Secondary | ICD-10-CM

## 2016-06-14 DIAGNOSIS — Z809 Family history of malignant neoplasm, unspecified: Secondary | ICD-10-CM

## 2016-06-14 DIAGNOSIS — C50412 Malignant neoplasm of upper-outer quadrant of left female breast: Secondary | ICD-10-CM | POA: Diagnosis not present

## 2016-06-14 DIAGNOSIS — Z803 Family history of malignant neoplasm of breast: Secondary | ICD-10-CM

## 2016-06-14 DIAGNOSIS — G35 Multiple sclerosis: Secondary | ICD-10-CM | POA: Diagnosis not present

## 2016-06-14 DIAGNOSIS — Z8709 Personal history of other diseases of the respiratory system: Secondary | ICD-10-CM

## 2016-06-14 NOTE — Assessment & Plan Note (Signed)
Left lumpectomy 06/07/2016: IDC grade 1, 1.1 cm, DCIS intermediate grade, margins negative, 0/2 lymph nodes negative, T1c N0 stage IA  Genetic testing: Negative  Pathology counseling: I discussed the final pathology report of the patient provided  a copy of this report. I discussed the margins as well as lymph node surgeries. We also discussed the final staging along with previously performed ER/PR and HER-2/neu testing.  Recommendation: 1. Oncotype DX testing to determine if chemotherapy would be of any benefit followed by 2. Adjuvant radiation therapy followed by 3. Adjuvant antiestrogen therapy With anastrozole 1 mg daily.  Patient has multiple sclerosis and she will check with her neurologist about anastrozole and interaction with her current multiple sclerosis treatment regimen. One of her major goals to see her is to go to [place] in October. She does not want to start her radiation until she returns back from [place] vacation.  Return to clinic based upon Oncotype DX test result.

## 2016-06-14 NOTE — Progress Notes (Signed)
Patient Care Team: Brunetta Jeans, PA-C as PCP - General (Family Medicine) Newman Pies, MD as Consulting Physician (Neurosurgery) Ardis Hughs, MD as Attending Physician (Urology) Arvella Nigh, MD as Consulting Physician (Obstetrics and Gynecology)  SUMMARY OF ONCOLOGIC HISTORY:   Breast cancer of upper-outer quadrant of left female breast ([place])   05/10/2016 Initial Diagnosis    Left breast biopsy UOQ: IDC with DCIS, grade 1-2, ER 100%, PR 10%, Ki-67 10%, HER-2 negative ratio 1.59      05/10/2016 Mammogram    Left breast: 1.2 cm irregular marginated mass 1:00 position 6 cm from the nipple. Numerous grouped calcifications throughout right breast: Stable compared to prior, Left breast: calcifications stable, T1 cN0 stage IA clinical stage      05/11/2016 Procedure    Genetic testing:negative for mutations within any of 33 genes on the Custom Panel (Breast, Gyn, GI genes plus FLCN due to her personal history of pneumothorax) through [facility]      06/07/2016 Surgery    Left lumpectomy: IDC grade 1, 1.1 cm, DCIS intermediate grade, margins negative, 0/2 lymph nodes negative, T1c N0 stage IA       CHIEF COMPLIANT: Follow-up after recent left lumpectomy  INTERVAL HISTORY: Stacy Logan is a 50 year old with above-mentioned history of left breast cancer treated with lumpectomy. She is here for one-week follow-up to discuss pathology report. She reports pain and discomfort underneath the left axilla. The breast itself appears to be fine. She complains of soreness underneath the axilla.  REVIEW OF SYSTEMS:   Constitutional: Denies fevers, chills or abnormal weight loss Eyes: Denies blurriness of vision Ears, nose, mouth, throat, and face: Denies mucositis or sore throat Respiratory: Denies cough, dyspnea or wheezes Cardiovascular: Denies palpitation, chest discomfort Gastrointestinal:  Denies nausea, heartburn or change in bowel habits Skin: Denies abnormal skin  rashes Lymphatics: Denies new lymphadenopathy or easy bruising Neurological:Denies numbness, tingling or new weaknesses Behavioral/Psych: Mood is stable, no new changes  Extremities: No lower extremity edema Breast: Recent left lumpectomy All other systems were reviewed with the patient and are negative.  I have reviewed the past medical history, past surgical history, social history and family history with the patient and they are unchanged from previous note.  ALLERGIES:  is allergic to codeine; lamictal [lamotrigine]; meperidine; meperidine hcl; and morphine.  MEDICATIONS:  Current Outpatient Prescriptions  Medication Sig Dispense Refill  . albuterol (PROVENTIL HFA;VENTOLIN HFA) 108 (90 Base) MCG/ACT inhaler Inhale 2 puffs into the lungs every 6 (six) hours as needed for wheezing or shortness of breath. 1 Inhaler 0  . amitriptyline (ELAVIL) 25 MG tablet Take 25-50 mg by mouth at bedtime.    . baclofen (LIORESAL) 10 MG tablet Take 1 tablet (10 mg total) by mouth 2 (two) times daily. 60 tablet 11  . diazepam (VALIUM) 5 MG tablet Take 1 tablet (5 mg total) by mouth every 8 (eight) hours as needed. For muscle spasms. 90 tablet 3  . Dimethyl Fumarate (TECFIDERA) 240 MG CPDR Take 1 capsule (240 mg total) by mouth 2 (two) times daily. 180 capsule 3  . gabapentin (NEURONTIN) 300 MG capsule Take 1 capsule (300 mg total) by mouth 3 (three) times daily. 90 capsule 11  . HYDROcodone-acetaminophen (NORCO/VICODIN) 5-325 MG tablet Take 1-2 tablets by mouth every 4 (four) hours as needed. 35 tablet 0  . Meth-Hyo-M Bl-Na Phos-Ph Sal (URIBEL) 118 MG CAPS Take 1 capsule by mouth 3 (three) times daily as needed (PAIN/UTI).     Marland Kitchen methylphenidate (  RITALIN) 10 MG tablet Take 2 pills in the am and 1 pill at noon 90 tablet 0  . naproxen (NAPROSYN) 500 MG tablet Take 1 tablet (500 mg total) by mouth 2 (two) times daily with a meal. 60 tablet 5  . nitrofurantoin (MACRODANTIN) 100 MG capsule Take 100 mg by mouth 2  (two) times daily.    . SUMAtriptan (IMITREX) 100 MG tablet Take 1 tablet (100 mg total) by mouth every 2 (two) hours as needed for migraine. May repeat in 2 hours if headache persists or recurs. 10 tablet 6  . valACYclovir (VALTREX) 1000 MG tablet TAKE TWO TABLETS BY MOUTH EVERY 12 HOURS FOR  2  DOSES  AS  NEEDED  FOR  COLD  SORES 30 tablet 5  . venlafaxine XR (EFFEXOR-XR) 75 MG 24 hr capsule Take 1 capsule (75 mg total) by mouth daily with breakfast. 30 capsule 5   No current facility-administered medications for this visit.     PHYSICAL EXAMINATION: ECOG PERFORMANCE STATUS: 1 - Symptomatic but completely ambulatory  Vitals:   06/14/16 1137  BP: (!) 141/93  Pulse: 90  Resp: 18  Temp: 98.7 F (37.1 C)   Filed Weights   06/14/16 1137  Weight: 122 lb 4.8 oz (55.5 kg)    GENERAL:alert, no distress and comfortable SKIN: skin color, texture, turgor are normal, no rashes or significant lesions EYES: normal, Conjunctiva are pink and non-injected, sclera clear OROPHARYNX:no exudate, no erythema and lips, buccal mucosa, and tongue normal  NECK: supple, thyroid normal size, non-tender, without nodularity LYMPH:  no palpable lymphadenopathy in the cervical, axillary or inguinal LUNGS: clear to auscultation and percussion with normal breathing effort HEART: regular rate & rhythm and no murmurs and no lower extremity edema ABDOMEN:abdomen soft, non-tender and normal bowel sounds MUSCULOSKELETAL:no cyanosis of digits and no clubbing  NEURO: alert & oriented x 3 with fluent speech, no focal motor/sensory deficits EXTREMITIES: No lower extremity edema  LABORATORY DATA:  I have reviewed the data as listed   Chemistry      Component Value Date/Time   NA 141 06/03/2016 1500   K 4.1 06/03/2016 1500   CL 105 06/03/2016 1500   CO2 30 06/03/2016 1500   BUN 12 06/03/2016 1500   CREATININE 0.73 06/03/2016 1500   CREATININE 0.55 12/26/2014 1042      Component Value Date/Time   CALCIUM 9.6  06/03/2016 1500   ALKPHOS 75 06/03/2016 1500   AST 21 06/03/2016 1500   ALT 16 06/03/2016 1500   BILITOT 0.7 06/03/2016 1500       Lab Results  Component Value Date   WBC 4.6 06/03/2016   HGB 12.8 06/03/2016   HCT 38.9 06/03/2016   MCV 94.2 06/03/2016   PLT 123 (L) 06/03/2016   NEUTROABS 3.0 06/03/2016     ASSESSMENT & PLAN:  Breast cancer of upper-outer quadrant of left female breast ([place]) Left lumpectomy 06/07/2016: IDC grade 1, 1.1 cm, DCIS intermediate grade, margins negative, 0/2 lymph nodes negative, T1c N0 stage IA  Genetic testing: Negative  Pathology counseling: I discussed the final pathology report of the patient provided  a copy of this report. I discussed the margins as well as lymph node surgeries. We also discussed the final staging along with previously performed ER/PR and HER-2/neu testing.  Recommendation: 1. Oncotype DX testing to determine if chemotherapy would be of any benefit followed by 2. Adjuvant radiation therapy followed by 3. Adjuvant antiestrogen therapy With anastrozole 1 mg daily.  Patient has  multiple sclerosis and she will check with her neurologist about anastrozole and interaction with her current multiple sclerosis treatment regimen. One of her major goals to see her is to go to [place] in October. She does not want to start her radiation until she returns back from [place] vacation.  Return to clinic based upon Oncotype DX test result.     No orders of the defined types were placed in this encounter.  The patient has a good understanding of the overall plan. she agrees with it. she will call with any problems that may develop before the next visit here.   Rulon Eisenmenger, MD 06/14/16

## 2016-06-15 ENCOUNTER — Telehealth: Payer: Self-pay | Admitting: *Deleted

## 2016-06-15 NOTE — Telephone Encounter (Signed)
Ordered oncotype per Dr. Gudena.  Faxed requisition to pathology and confirmed receipt.  Faxed PA to [employer].  

## 2016-06-22 ENCOUNTER — Other Ambulatory Visit: Payer: Self-pay | Admitting: General Surgery

## 2016-06-22 DIAGNOSIS — E041 Nontoxic single thyroid nodule: Secondary | ICD-10-CM

## 2016-06-23 ENCOUNTER — Telehealth: Payer: Self-pay | Admitting: *Deleted

## 2016-06-23 NOTE — Telephone Encounter (Signed)
Received oncotype score of 22 Gave pt results and informed she did not need chemo and will receive a call from Dr. Lanell Persons office for a f/u appt to discuss xrt Denies further needs at this time. Physician team notified.

## 2016-06-24 ENCOUNTER — Encounter (HOSPITAL_COMMUNITY): Payer: Self-pay

## 2016-06-25 ENCOUNTER — Ambulatory Visit
Admission: RE | Admit: 2016-06-25 | Discharge: 2016-06-25 | Disposition: A | Discharge status: Home / Self Care | Payer: BLUE CROSS/BLUE SHIELD | Source: Ambulatory Visit | Attending: General Surgery | Admitting: General Surgery

## 2016-06-25 DIAGNOSIS — E041 Nontoxic single thyroid nodule: Secondary | ICD-10-CM

## 2016-06-27 DIAGNOSIS — Z1379 Encounter for other screening for genetic and chromosomal anomalies: Secondary | ICD-10-CM | POA: Insufficient documentation

## 2016-06-28 NOTE — Progress Notes (Signed)
GENETIC TEST RESULT  HPI: [name] was previously seen in the [facility] due to a personal history of breast cancer, family history of breast and other cancers, and concerns regarding a hereditary predisposition to cancer. Please refer to our prior cancer genetics clinic note from May 27, 2016 for more information regarding [name] medical, social and family histories, and our assessment and recommendations, at the time. [name] recent genetic test results were disclosed to her, as were recommendations warranted by these results. These results and recommendations are discussed in more detail below.  GENETIC TEST RESULTS: At the time of [name] visit on 05/27/16, we recommended she pursue genetic testing of the 33-gene Custom Panel with MSH2 Exons 1-7 Inversion Analysis through [employer].  This Custom Panel offered by [facility] Stacy Roads, MD) includes sequencing and/or deletion duplication testing of the following 33 genes: APC, ATM, AXIN2, BARD1, BMPR1A, BRCA1, BRCA2, BRIP1, CDH1, CDK4, CDKN2A, CHEK2, EPCAM, FANCC, FLCN, MLH1, MSH2, MSH6, MUTYH, NBN, PALB2, PMS2, POLD1, POLE, PTEN, RAD51C, RAD51D, SCG5/GREM1, SMAD4, STK11, TP53, VHL, and XRCC2.  Those results are now back, the report date for which is June 10, 2016.  Genetic testing was normal, and did not reveal a deleterious mutation in these genes.  Additionally, no variants of uncertain significance (VUSes) were found.  The test report will be scanned into EPIC and will be located under the Results Review tab in the Pathology>Molecular Pathology section.   We discussed with [name] that since the current genetic testing is not perfect, it is possible there may be a gene mutation in one of these genes that current testing cannot detect, but that chance is small. We also discussed, that it is possible that another gene that has not yet been discovered, or that we have not yet tested, is  responsible for the cancer diagnoses in the family, and it is, therefore, important to remain in touch with cancer genetics in the future so that we can continue to offer [name] the most up-to-date genetic testing.   CANCER SCREENING RECOMMENDATIONS: We still do not have an explanation for the personal and family history of cancer. This result may be reassuring and indicates that [name] likely does not have an increased risk for a future cancer due to a mutation in one of these genes. This normal test also suggests that [name] cancer was most likely not due to an inherited predisposition associated with one of these genes.  Most cancers happen by chance and this negative test suggests that her cancer falls into this category.  Further genetic testing of other affected relatives could be helpful to furthering understanding of the personal and familial cancer risks.  In the meantime, we recommended she continue to follow the cancer management and screening guidelines provided by her oncology and primary healthcare providers.   RECOMMENDATIONS FOR FAMILY MEMBERS: Women in this family might be at some increased risk of developing cancer, over the general population risk, simply due to the family history of cancer. We recommended women in this family have a yearly mammogram beginning at age 104, or 65 years younger than the earliest onset of cancer, an annual clinical breast exam, and perform monthly breast self-exams. [name] daughter and niece can likely begin annual mammogram screening at age 50 due to her sister's diagnosis at 59.  [name] other sister who is 75 years old and unaffected an likely receive additional breast cancer screening, such as annual breast MRIs  in addition to annual mammograms.  She should discuss this option with her doctor.  Women in this family should also have a gynecological exam as recommended by their primary provider. All family members should have a colonoscopy  by age 2.    Based on [name] family history, we recommended her sister, who was diagnosed with breast cancer at age 71 and her paternal first cousin who was diagnosed with breast cancer in her 69s, have genetic counseling and testing. [name] will let us know if we can be of any assistance in coordinating genetic counseling and/or testing for these family members.   FOLLOW-UP: Lastly, we discussed with [name] that cancer genetics is a rapidly advancing field and it is possible that new genetic tests will be appropriate for her and/or her family members in the future. We encouraged her to remain in contact with cancer genetics on an annual basis so we can update her personal and family histories and let her know of advances in cancer genetics that may benefit this family.   Our contact number was provided. [name] questions were answered to her satisfaction, and she knows she is welcome to call us at anytime with additional questions or concerns.   Stacy Luz, MS, Stacy Logan Certified Genetic Counselor [email or web address] Phone: [phone]

## 2016-07-08 ENCOUNTER — Encounter: Payer: Self-pay | Admitting: Neurology

## 2016-07-08 ENCOUNTER — Ambulatory Visit (INDEPENDENT_AMBULATORY_CARE_PROVIDER_SITE_OTHER): Payer: BLUE CROSS/BLUE SHIELD | Admitting: Neurology

## 2016-07-08 VITALS — BP 126/84 | HR 84 | Resp 16 | Ht 69.0 in | Wt 120.0 lb

## 2016-07-08 DIAGNOSIS — M4722 Other spondylosis with radiculopathy, cervical region: Secondary | ICD-10-CM | POA: Diagnosis not present

## 2016-07-08 DIAGNOSIS — F418 Other specified anxiety disorders: Secondary | ICD-10-CM | POA: Diagnosis not present

## 2016-07-08 DIAGNOSIS — N3941 Urge incontinence: Secondary | ICD-10-CM | POA: Diagnosis not present

## 2016-07-08 DIAGNOSIS — G35 Multiple sclerosis: Secondary | ICD-10-CM | POA: Diagnosis not present

## 2016-07-08 DIAGNOSIS — R5383 Other fatigue: Secondary | ICD-10-CM | POA: Diagnosis not present

## 2016-07-08 DIAGNOSIS — M4712 Other spondylosis with myelopathy, cervical region: Secondary | ICD-10-CM | POA: Diagnosis not present

## 2016-07-08 DIAGNOSIS — G5139 Clonic hemifacial spasm, unspecified: Secondary | ICD-10-CM

## 2016-07-08 DIAGNOSIS — G513 Clonic hemifacial spasm: Secondary | ICD-10-CM | POA: Diagnosis not present

## 2016-07-08 DIAGNOSIS — R208 Other disturbances of skin sensation: Secondary | ICD-10-CM

## 2016-07-08 MED ORDER — METHYLPHENIDATE HCL 10 MG PO TABS: ORAL_TABLET | ORAL | 0 refills | Status: DC

## 2016-07-08 MED ORDER — BACLOFEN 10 MG PO TABS: ORAL_TABLET | ORAL | 11 refills | Status: DC

## 2016-07-08 MED ORDER — SULFAMETHOXAZOLE-TRIMETHOPRIM 800-160 MG PO TABS: 1.0000 | ORAL_TABLET | Freq: Two times a day (BID) | ORAL | 0 refills | Status: DC

## 2016-07-08 NOTE — Progress Notes (Signed)
[facility]  PATIENT: Stacy Logan DOB: [birth year]  REFERRING DOCTOR OR PCP:  Annye Asa SOURCE: patient, EMR records from [facility].  MRI images on PACS  _________________________________   HISTORICAL  CHIEF COMPLAINT:  Chief Complaint  Patient presents with  . Multiple Sclerosis    Sts. she continues to tolerate Tecfidera well.  She will be starting radiation for breast cancer in November.  Sts. she contiues to have difficulty swallowing.  Sts. her surgeon checked her thyroid labs and they are  ok./fim    HISTORY OF PRESENT ILLNESS:  Stacy Logan is a 50 yo woman with multiple sclerosis and h/o compressive myelopathy.   When I saw her last visit, we did Botox for blepharospasm and right hemifacial spasms and they are better, though not resolved.  MS:   She is currently on Tecfidera for the MS.   She tolerates it fairly well but has had some itching.   She denies stomach upset.   She repots more trouble with verbal fluency and her right leg is dragging more since the last visit.    Gait/strength/sensation/pain:  She notes a right foot drop but feels weaker in the left leg, proximally.   She feels balance is worse and she is stumbling more and has fallen some.      Since the surgery, she notes more left hand numbness and weakness. Hr arms feel weak left and right.   She has spasticity.   Baclofen helps spasticity but it makes her sleepy.  For the dysesthesias, gabapentin had not helped her much.  She takes percocet 10 mg at times for pain but it has not helped.        She felt crazy on lamotrigine and stopped.   Breast Ca:She had left breast cancer surgery last month and starts [facility]Rx.   She was told that the lymph nodes did not show cancer.       Bladder:   She continues to have urinary frequency and incontinence.   She sees Dr. Kary Kos.   She takes intravaginal valium if she gets spasms there.  She has had many UTI's and thinks she has one now.     Dysphagia:   She has had some difficulty with swallowing and coughs after she eats.  Vision/eyes: She feels vision is unchanged and no definite MS related visual problems.  She feels peripheral vision is reduced.  Her right eyelid is twitching a lot.   Twitching is worse in bright lights and when she is tired at night.   Fatigue/sleep:  She is having more fatigue, despite sleeping 12  hours a day between night time and a long afternoon nap. Fatigue is both physical and mental.    She sleeps poorly at night waking up a lot due to spasms and leg cramps.    She felt sick on Adderall XR in the past.   Insurance will not cover Provigil  Mood/cognition:   She has some depression and anxiety. She gets agoraphobia when shopping.    She takes Effexor 75 mg .  She notes a lot of difficulty with cognition and attention. She has had more verbal fluency problems.   Organizing is difficult.   She is very forgetful and distractible.   She has trouble following through with tasks  Migraine:  She is having migraines 2 times monthly, better since the Botox for blepharospasm and hemifacial spasm.  She has a lot more chance of migraine when a weather front  comes through.   Imitrex helps but incompletely   MS/spine history:   In 2001, she was noted to have left hearing loss and had an MRI of the brain.   She did not have numbness or weakness a that time.  She was told she likely had MS.   She saw a neurologist.  She never had an LP.   She reports she was told she had MS but was never started on any medication.   Starting about 5-6 years ago, she was noting more difficulty with gait and also noted some memory issues.   She was also having bladder issues with urgency and bladder incontinence x 5 years.      However, she had no health insurance at that time and did not follow up with neurology.   She saw Dr. Tomi Likens last year.   He ordered an MRI of the brian and cervical spine.   She had one enhancing lesion on the spine and  one additional lesion.  An LP was ordered but she opted not to do the test.   She was started on Tecfidera.   She has had some itching and flushing, especially if she does not take after food.     She also was found to have severe spinal stenosis and myelopathy.   She was sent to Dr. Arnoldo Morale of Neurosurgery.   She underwent  ACDF from C3-C7 06/2014.          I have personally reviewed the MRI of the brain dated 01/12/2015 and the MRIs of the cervical spine dated 02/01/2015 and 05/22/2014.   The MRI of the brain shows white matter foci predominantly in the deep white matter but also in the periventricular and subcortical white matter of both hemispheres. The brainstem appears normal. There is no significant atrophy. The MRI of the cervical spine from 2015 showed severe spinal stenosis at C5-C6 and milder spinal stenosis at C6-C7 and C4-C5. There is an enhancing focus within the spinal cord adjacent to C5-C6 and she has another hyperintense focus at C3-C4. On the second MRI, done without contrast, she is status post C3-C7 ACDF.      REVIEW OF SYSTEMS: Constitutional: No fevers, chills, sweats, or change in appetite Eyes: as above Ear, nose and throat: No hearing loss, ear pain, nasal congestion, sore throat Cardiovascular: No chest pain, palpitations Respiratory: No shortness of breath at rest or with exertion.   No wheezes GastrointestinaI: No nausea, vomiting, diarrhea, abdominal pain, fecal incontinence Genitourinary: see above Musculoskeletal: Some neck pain, back pain Integumentary: No rash, pruritus, skin lesions Neurological: as above Psychiatric: Notes depression and anxiety Endocrine: No palpitations, diaphoresis, change in appetite, change in weigh or increased thirst Hematologic/Lymphatic: No anemia, purpura, petechiae. Allergic/Immunologic: No itchy/runny eyes, nasal congestion, recent allergic reactions, rashes  ALLERGIES: Allergies  Allergen Reactions  . Codeine     Makes her  "busy", itchy  . Lamictal [Lamotrigine] Other (See Comments)    Mood changes   . Meperidine Nausea And Vomiting  . Meperidine Hcl Nausea And Vomiting    SEVERE N/V  . Morphine Other (See Comments)    "SKIN CRAWLS"    HOME MEDICATIONS:  Current Outpatient Prescriptions:  .  albuterol (PROVENTIL HFA;VENTOLIN HFA) 108 (90 Base) MCG/ACT inhaler, Inhale 2 puffs into the lungs every 6 (six) hours as needed for wheezing or shortness of breath., Disp: 1 Inhaler, Rfl: 0 .  amitriptyline (ELAVIL) 25 MG tablet, Take 25-50 mg by mouth at bedtime., Disp: , Rfl:  .  baclofen (LIORESAL) 10 MG tablet, Take 1 tablet (10 mg total) by mouth 2 (two) times daily., Disp: 60 tablet, Rfl: 11 .  diazepam (VALIUM) 5 MG tablet, Take 1 tablet (5 mg total) by mouth every 8 (eight) hours as needed. For muscle spasms., Disp: 90 tablet, Rfl: 3 .  Dimethyl Fumarate (TECFIDERA) 240 MG CPDR, Take 1 capsule (240 mg total) by mouth 2 (two) times daily., Disp: 180 capsule, Rfl: 3 .  gabapentin (NEURONTIN) 300 MG capsule, Take 1 capsule (300 mg total) by mouth 3 (three) times daily., Disp: 90 capsule, Rfl: 11 .  HYDROcodone-acetaminophen (NORCO/VICODIN) 5-325 MG tablet, Take 1-2 tablets by mouth every 4 (four) hours as needed., Disp: 35 tablet, Rfl: 0 .  Meth-Hyo-M Bl-Na Phos-Ph Sal (URIBEL) 118 MG CAPS, Take 1 capsule by mouth 3 (three) times daily as needed (PAIN/UTI). , Disp: , Rfl:  .  methylphenidate (RITALIN) 10 MG tablet, Take 2 pills in the am and 1 pill at noon, Disp: 90 tablet, Rfl: 0 .  naproxen (NAPROSYN) 500 MG tablet, Take 1 tablet (500 mg total) by mouth 2 (two) times daily with a meal., Disp: 60 tablet, Rfl: 5 .  nitrofurantoin (MACRODANTIN) 100 MG capsule, Take 100 mg by mouth 2 (two) times daily., Disp: , Rfl:  .  SUMAtriptan (IMITREX) 100 MG tablet, Take 1 tablet (100 mg total) by mouth every 2 (two) hours as needed for migraine. May repeat in 2 hours if headache persists or recurs., Disp: 10 tablet, Rfl: 6 .   valACYclovir (VALTREX) 1000 MG tablet, TAKE TWO TABLETS BY MOUTH EVERY 12 HOURS FOR  2  DOSES  AS  NEEDED  FOR  COLD  SORES, Disp: 30 tablet, Rfl: 5 .  venlafaxine XR (EFFEXOR-XR) 75 MG 24 hr capsule, Take 1 capsule (75 mg total) by mouth daily with breakfast., Disp: 30 capsule, Rfl: 5  PAST MEDICAL HISTORY: Past Medical History:  Diagnosis Date  . Anal fissure   . Anxiety   . Arthritis   . Borderline diabetes   . [facility])    left breast cancer  . Depression   . Frequency of urination   . H/O cold sores   . History of pneumothorax    1988-- SPONTANEOUS--  RESOLVED W/ CHEST TUBE  . IC (interstitial cystitis)   . Lesion of bladder   . Migraines   . MS (multiple sclerosis) ([place])    MRI showed plague on her brain  . Nocturia   . RSD (reflex sympathetic dystrophy)   . Seasonal allergies   . Urgency of urination     PAST SURGICAL HISTORY: Past Surgical History:  Procedure Laterality Date  . ANTERIOR CERVICAL DECOMPRESSION/DISCECTOMY FUSION 4 LEVELS N/A 07/03/2014   Procedure: ANTERIOR CERVICAL DECOMPRESSION/DISCECTOMY FUSION 4 LEVELS;  Surgeon: Newman Pies, MD;  Location: [facility];  Service: Neurosurgery;  Laterality: N/A;  C3-4 C4-5 C5-6 C6-7 Anterior cervical decompression/diskectomy/fusion/interbody prosthesis/plate  . APPENDECTOMY    . CESAREAN SECTION  1994  . CYSTOSCOPY WITH HYDRODISTENSION AND BIOPSY Bilateral 02/15/2014   Procedure: CYSTOSCOPY  BILATERAL RETROGRADE PYLOGRAM, HYDRODISTENSION, INSTILATION OF MARCAINE AND PYRIDIUM;  Surgeon: Ardis Hughs, MD;  Location: [facility];  Service: Urology;  Laterality: Bilateral;  . D & C HYSTEROSCOPY WITH POLYPECTOMY  12-02-2003  . DILATION AND CURETTAGE OF UTERUS    . DX LAPAROSCOPY/  FULGERATION ENDOMETRIOSIS/  APPENDECTOMY  1985  . RADIOACTIVE SEED GUIDED MASTECTOMY WITH AXILLARY SENTINEL LYMPH NODE BIOPSY Left 06/07/2016   Procedure: BREAST LUMPECTOMY  WITH RADIOACTIVE SEED AND SENTINEL LYMPH NODE  BIOPSY, INJECT BLUE DYE LEFT BREAST;  Surgeon: Fanny Skates, MD;  Location: [facility];  Service: General;  Laterality: Left;  . TONSILLECTOMY AND ADENOIDECTOMY  1972    FAMILY HISTORY: Family History  Problem Relation Age of Onset  . Hypertension Mother   . Diabetes Father   . Prostate cancer Father     dx late 3s  . Lung cancer Father 24    smoker  . Cancer Father     cheek of mouth, dx. between 60 and 36  . Diabetes Paternal Grandmother   . Diabetes Paternal Grandfather   . Aneurysm Maternal Grandmother 76    d. brain aneurysm  . Colon cancer Maternal Grandfather 57  . Diabetes Paternal Uncle     x 4  . Diabetes Paternal Aunt   . Colon cancer Maternal Uncle   . Cancer Maternal Uncle     mouth cancer; +tobacco  . Breast cancer Sister 65    s/p mastectomy  . Breast cancer Maternal Aunt     dx 64s  . Lymphoma Maternal Aunt 83  . Uterine cancer Maternal Aunt     d. late 40s  . Throat cancer Cousin     maternal 1st cousin; lim info  . Cancer Paternal Uncle     NOS cancer  . Breast cancer Cousin     paternal 1st cousin dx 78s  . Esophageal cancer Neg Hx   . Rectal cancer Neg Hx   . Stomach cancer Neg Hx     SOCIAL HISTORY:  Social History   Social History  . Marital status: Single    Spouse name: N/A  . Number of children: 1  . Years of education: N/A   Occupational History  . self-employed house cleaning    Social History Main Topics  . Smoking status: Former Smoker    Packs/day: 0.25    Years: 27.00    Types: Cigarettes    Quit date: 01/17/2011  . Smokeless tobacco: Never Used  . Alcohol use No  . Drug use: No  . Sexual activity: No   Other Topics Concern  . Not on file   Social History Narrative  . No narrative on file     PHYSICAL EXAM  Vitals:   07/08/16 1542  BP: 126/84  Pulse: 84  Resp: 16  Weight: 120 lb (54.4 kg)  Height: 5\' 9"  (1.753 m)    Body mass index is 17.72 kg/m.   General: The patient is a thin  woman in no acute distress  Neck: The neck is post-op.   Skin: Extremities are without significant edema.   Neurologic Exam  Mental status: The patient is alert and oriented x 3 at the time of the examination. The patient has apparent normal recent and remote memory, with an apparently normal attention span and concentration ability.   Speech is normal.  Cranial nerves: Occasional right > left eyelid twitches.   Extraocular movements are full. Pupils are equal, round, and reactive to light and accomodation.    Facial symmetry is present. There is good facial sensation to soft touch bilaterally.Facial strength is normal.  Trapezius and sternocleidomastoid strength is normal. No dysarthria is noted.  The tongue is midline, and the patient has symmetric elevation of the soft palate. No obvious hearing deficits are noted.  Motor:  Muscle bulk is normal.   Tone is normal. Strength is  4+ / 5 in the right triceps  and 4 /5 in bilateral intrinsic hand muscles and 5/5 in legs.except 4+/5 right EHL   Sensory: She has decreased sensation to touch and vibration in left hand relative to the right. Worse numbness is hypothenar eminence.   She reports decreased vibration in left leg and arm, relative to right  Coordination: Cerebellar testing reveals reduced left finger-nose-finger and reduced left worse than right heel-to-shin .  Gait and station: Station is normal.   Gait is mildly wide. Tandem gait is wide. Romberg is negative.   Reflexes: Deep tendon reflexes are increased bilaterally with spread at the knees but no clonus at ankles.       DIAGNOSTIC DATA (LABS, IMAGING, TESTING) - I reviewed patient records, labs, notes, testing and imaging myself where available.  Lab Results  Component Value Date   WBC 4.6 06/03/2016   HGB 12.8 06/03/2016   HCT 38.9 06/03/2016   MCV 94.2 06/03/2016   PLT 123 (L) 06/03/2016      Component Value Date/Time   NA 141 06/03/2016 1500   K 4.1 06/03/2016 1500     CL 105 06/03/2016 1500   CO2 30 06/03/2016 1500   GLUCOSE 94 06/03/2016 1500   BUN 12 06/03/2016 1500   CREATININE 0.73 06/03/2016 1500   CREATININE 0.55 12/26/2014 1042   CALCIUM 9.6 06/03/2016 1500   PROT 7.1 06/03/2016 1500   ALBUMIN 4.1 06/03/2016 1500   AST 21 06/03/2016 1500   ALT 16 06/03/2016 1500   ALKPHOS 75 06/03/2016 1500   BILITOT 0.7 06/03/2016 1500   GFRNONAA >60 06/03/2016 1500   GFRNONAA >89 12/26/2014 1042   GFRAA >60 06/03/2016 1500   GFRAA >89 12/26/2014 1042   Lab Results  Component Value Date   CHOL 161 05/15/2015   HDL 62.60 05/15/2015   LDLCALC 86 05/15/2015   TRIG 61.0 05/15/2015   CHOLHDL 3 05/15/2015   Lab Results  Component Value Date   HGBA1C 5.9 05/15/2015   Lab Results  Component Value Date   VITAMINB12 154 (L) 03/28/2015   Lab Results  Component Value Date   TSH 0.87 05/15/2015       ASSESSMENT AND PLAN  MS (multiple sclerosis) ([place]) - Plan: CBC with Differential/Platelet  Cervical spondylosis with myelopathy and radiculopathy  Depression with anxiety  Dysesthesia  Other fatigue  Urge incontinence - Plan: Urine culture  Hemifacial spasm     1.   Continue Tecfidera. Check CBC with differential to make sure that there is not any lymphopenia. 2.   Renew ritalin for hypersomnia, cognitive issues and MS fatigue 3.   Continue baclofen but can increase to 3/day for spasticity 4.   Stay active.   Continue other medications 5.   Botox for HFS, blepharospasm by end of year.   She will return to see me in 4 months or sooner if she has new or worsening neurologic symptoms.    6.    If swallowing worsens, consider barium swallow study or evaluation by GI.  7.   Due to the combination of physical and cognitive impairments, she is unable to work.   45 minute face-to-face evaluation with greater than one half of the time counseling and coordinating care about her MS and symptoms.  Richard A. Felecia Shelling, MD, PhD [id number], [id number]  PM Certified in Neurology, Clinical Neurophysiology, Sleep Medicine, Pain Medicine and Neuroimaging  [facility] [address], [place], [place] [address] [phone]

## 2016-07-09 LAB — CBC WITH DIFFERENTIAL/PLATELET
BASOS ABS: 0 10*3/uL (ref 0.0–0.2)
BASOS: 0 %
EOS (ABSOLUTE): 0.1 10*3/uL (ref 0.0–0.4)
EOS: 3 %
HEMATOCRIT: 38.3 % (ref 34.0–46.6)
HEMOGLOBIN: 13.1 g/dL (ref 11.1–15.9)
IMMATURE GRANS (ABS): 0 10*3/uL (ref 0.0–0.1)
Immature Granulocytes: 0 %
LYMPHS ABS: 1.4 10*3/uL (ref 0.7–3.1)
LYMPHS: 27 %
MCH: 31.2 pg (ref 26.6–33.0)
MCHC: 34.2 g/dL (ref 31.5–35.7)
MCV: 91 fL (ref 79–97)
MONOCYTES: 10 %
Monocytes Absolute: 0.5 10*3/uL (ref 0.1–0.9)
NEUTROS ABS: 3 10*3/uL (ref 1.4–7.0)
Neutrophils: 60 %
Platelets: 183 10*3/uL (ref 150–379)
RBC: 4.2 x10E6/uL (ref 3.77–5.28)
RDW: 13.7 % (ref 12.3–15.4)
WBC: 5 10*3/uL (ref 3.4–10.8)

## 2016-07-09 LAB — URINE CULTURE: Organism ID, Bacteria: NO GROWTH

## 2016-07-12 ENCOUNTER — Telehealth: Payer: Self-pay | Admitting: *Deleted

## 2016-07-12 ENCOUNTER — Ambulatory Visit: Payer: BLUE CROSS/BLUE SHIELD | Admitting: Neurology

## 2016-07-12 NOTE — Telephone Encounter (Signed)
I have spoken with Stacy Logan this afternoon, and per RAS, advised that u/a and c&s was normal, so she can stop her antibiotic.  She verbalized understanding of same/fim

## 2016-07-12 NOTE — Telephone Encounter (Signed)
-----   Message from Britt Bottom, MD sent at 07/11/2016 12:14 PM EDT ----- UA and culture were negative so she can stop the antibiotic

## 2016-07-14 ENCOUNTER — Encounter: Payer: Self-pay | Admitting: Physician Assistant

## 2016-07-14 ENCOUNTER — Ambulatory Visit (INDEPENDENT_AMBULATORY_CARE_PROVIDER_SITE_OTHER): Payer: BLUE CROSS/BLUE SHIELD | Admitting: Physician Assistant

## 2016-07-14 VITALS — BP 104/66 | HR 75 | Temp 98.3°F | Resp 16 | Ht 69.0 in | Wt 121.1 lb

## 2016-07-14 DIAGNOSIS — M79641 Pain in right hand: Secondary | ICD-10-CM | POA: Diagnosis not present

## 2016-07-14 DIAGNOSIS — W5501XA Bitten by cat, initial encounter: Secondary | ICD-10-CM

## 2016-07-14 MED ORDER — AMOXICILLIN-POT CLAVULANATE 875-125 MG PO TABS: 1.0000 | ORAL_TABLET | Freq: Two times a day (BID) | ORAL | 0 refills | Status: DC

## 2016-07-14 NOTE — Patient Instructions (Addendum)
You will be contacted by animal control. They will capture the animal and observe the animal for signs of rabies.   It is recommended that you go to an ER for rabies immunoglobulin and rabies vaccinations as we do not have these in clinic.   Please take the Augmentin as directed with food.  Wear knee sleeve as directed. Elevate legs while resting. Follow-up if symptoms are not resolving.

## 2016-07-14 NOTE — Progress Notes (Signed)
Pre visit review using our clinic review tool, if applicable. No additional management support is needed unless otherwise documented below in the visit note/SLS  

## 2016-07-15 NOTE — Progress Notes (Signed)
Patient presents to clinic today c/o bite to the L dorsal hand yesterday but a stray cat. Patient endorses the cat is a familiar site at her home, but is unwanted. States she tried to shoo the cut and swatted at animal while shooing it, prompting the animal to bite her hand and scratch her arm. Endorses the animal was otherwise acting normally and lounging in the yard. Patient notes pain and site of bite with some redness. Denies fever, chills. Denies decreased ROM of extremity.  Past Medical History:  Diagnosis Date  . Anal fissure   . Anxiety   . Arthritis   . Borderline diabetes   . Cancer [facility])    left breast cancer  . Depression   . Frequency of urination   . H/O cold sores   . History of pneumothorax    1988-- SPONTANEOUS--  RESOLVED W/ CHEST TUBE  . IC (interstitial cystitis)   . Lesion of bladder   . Migraines   . MS (multiple sclerosis) ([employer])    MRI showed plague on her brain  . Nocturia   . RSD (reflex sympathetic dystrophy)   . Seasonal allergies   . Urgency of urination     Current Outpatient Prescriptions on File Prior to Visit  Medication Sig Dispense Refill  . albuterol (PROVENTIL HFA;VENTOLIN HFA) 108 (90 Base) MCG/ACT inhaler Inhale 2 puffs into the lungs every 6 (six) hours as needed for wheezing or shortness of breath. 1 Inhaler 0  . amitriptyline (ELAVIL) 25 MG tablet Take 25-50 mg by mouth at bedtime.    . baclofen (LIORESAL) 10 MG tablet Take one pill during the day and two days at night 90 tablet 11  . diazepam (VALIUM) 5 MG tablet Take 1 tablet (5 mg total) by mouth every 8 (eight) hours as needed. For muscle spasms. 90 tablet 3  . Dimethyl Fumarate (TECFIDERA) 240 MG CPDR Take 1 capsule (240 mg total) by mouth 2 (two) times daily. 180 capsule 3  . gabapentin (NEURONTIN) 300 MG capsule Take 1 capsule (300 mg total) by mouth 3 (three) times daily. 90 capsule 11  . Meth-Hyo-M Bl-Na Phos-Ph Sal (URIBEL) 118 MG CAPS Take 1 capsule by mouth 3 (three) times  daily as needed (PAIN/UTI).     Marland Kitchen methylphenidate (RITALIN) 10 MG tablet Take 2 pills in the am and 1 pill at noon 90 tablet 0  . naproxen (NAPROSYN) 500 MG tablet Take 1 tablet (500 mg total) by mouth 2 (two) times daily with a meal. 60 tablet 5  . SUMAtriptan (IMITREX) 100 MG tablet Take 1 tablet (100 mg total) by mouth every 2 (two) hours as needed for migraine. May repeat in 2 hours if headache persists or recurs. 10 tablet 6  . valACYclovir (VALTREX) 1000 MG tablet TAKE TWO TABLETS BY MOUTH EVERY 12 HOURS FOR  2  DOSES  AS  NEEDED  FOR  COLD  SORES 30 tablet 5  . venlafaxine XR (EFFEXOR-XR) 75 MG 24 hr capsule Take 1 capsule (75 mg total) by mouth daily with breakfast. 30 capsule 5   No current facility-administered medications on file prior to visit.     Allergies  Allergen Reactions  . Codeine     Makes her "busy", itchy  . Lamictal [Lamotrigine] Other (See Comments)    Mood changes   . Meperidine Nausea And Vomiting  . Meperidine Hcl Nausea And Vomiting    SEVERE N/V  . Morphine Other (See Comments)    "SKIN CRAWLS"  Family History  Problem Relation Age of Onset  . Hypertension Mother   . Diabetes Father   . Prostate cancer Father     dx late 34s  . Lung cancer Father 53    smoker  . Cancer Father     cheek of mouth, dx. between 64 and 95  . Diabetes Paternal Grandmother   . Diabetes Paternal Grandfather   . Aneurysm Maternal Grandmother 76    d. brain aneurysm  . Colon cancer Maternal Grandfather 75  . Diabetes Paternal Uncle     x 4  . Diabetes Paternal Aunt   . Colon cancer Maternal Uncle   . Cancer Maternal Uncle     mouth cancer; +tobacco  . Breast cancer Sister 41    s/p mastectomy  . Breast cancer Maternal Aunt     dx 61s  . Lymphoma Maternal Aunt 83  . Uterine cancer Maternal Aunt     d. late 87s  . Throat cancer Cousin     maternal 1st cousin; lim info  . Cancer Paternal Uncle     NOS cancer  . Breast cancer Cousin     paternal 1st cousin dx  68s  . Esophageal cancer Neg Hx   . Rectal cancer Neg Hx   . Stomach cancer Neg Hx     Social History   Social History  . Marital status: Single    Spouse name: N/A  . Number of children: 1  . Years of education: N/A   Occupational History  . self-employed house cleaning    Social History Main Topics  . Smoking status: Former Smoker    Packs/day: 0.25    Years: 27.00    Types: Cigarettes    Quit date: 01/17/2011  . Smokeless tobacco: Never Used  . Alcohol use No  . Drug use: No  . Sexual activity: No   Other Topics Concern  . None   Social History Narrative  . None    Review of Systems - See HPI.  All other ROS are negative.  BP 104/66 (BP Location: Left Arm, Patient Position: Sitting, Cuff Size: Normal)   Pulse 75   Temp 98.3 F (36.8 C) (Oral)   Resp 16   Ht '5\' 9"'$  (1.753 m)   Wt 121 lb 2 oz (54.9 kg)   SpO2 98%   BMI 17.89 kg/m   Physical Exam  Constitutional: She is oriented to person, place, and time and well-developed, well-nourished, and in no distress.  HENT:  Head: Normocephalic and atraumatic.  Eyes: Conjunctivae are normal.  Neck: Neck supple.  Cardiovascular: Normal rate, regular rhythm, normal heart sounds and intact distal pulses.   Pulmonary/Chest: Effort normal and breath sounds normal. No respiratory distress. She has no wheezes. She has no rales. She exhibits no tenderness.  Musculoskeletal:       Left hand: She exhibits normal range of motion. Normal sensation noted. Normal strength noted.       Hands: Neurological: She is alert and oriented to person, place, and time.  Skin: Skin is warm and dry.  Nursing note and vitals reviewed.   Recent Results (from the past 2160 hour(s))  CBC WITH DIFFERENTIAL     Status: Abnormal   Collection Time: 06/03/16  3:00 PM  Result Value Ref Range   WBC 4.6 4.0 - 10.5 K/uL   RBC 4.13 3.87 - 5.11 MIL/uL   Hemoglobin 12.8 12.0 - 15.0 g/dL   HCT 38.9 36.0 - 46.0 %  MCV 94.2 78.0 - 100.0 fL   MCH  31.0 26.0 - 34.0 pg   MCHC 32.9 30.0 - 36.0 g/dL   RDW 13.4 11.5 - 15.5 %   Platelets 123 (L) 150 - 400 K/uL   Neutrophils Relative % 66 %   Neutro Abs 3.0 1.7 - 7.7 K/uL   Lymphocytes Relative 18 %   Lymphs Abs 0.8 0.7 - 4.0 K/uL   Monocytes Relative 13 %   Monocytes Absolute 0.6 0.1 - 1.0 K/uL   Eosinophils Relative 3 %   Eosinophils Absolute 0.1 0.0 - 0.7 K/uL   Basophils Relative 0 %   Basophils Absolute 0.0 0.0 - 0.1 K/uL  Comprehensive metabolic panel     Status: None   Collection Time: 06/03/16  3:00 PM  Result Value Ref Range   Sodium 141 135 - 145 mmol/L   Potassium 4.1 3.5 - 5.1 mmol/L   Chloride 105 101 - 111 mmol/L   CO2 30 22 - 32 mmol/L   Glucose, Bld 94 65 - 99 mg/dL   BUN 12 6 - 20 mg/dL   Creatinine, Ser 0.73 0.44 - 1.00 mg/dL   Calcium 9.6 8.9 - 10.3 mg/dL   Total Protein 7.1 6.5 - 8.1 g/dL   Albumin 4.1 3.5 - 5.0 g/dL   AST 21 15 - 41 U/L   ALT 16 14 - 54 U/L   Alkaline Phosphatase 75 38 - 126 U/L   Total Bilirubin 0.7 0.3 - 1.2 mg/dL   GFR calc non Af Amer >60 >60 mL/min   GFR calc Af Amer >60 >60 mL/min    Comment: (NOTE) The eGFR has been calculated using the CKD EPI equation. This calculation has not been validated in all clinical situations. eGFR's persistently <60 mL/min signify possible Chronic Kidney Disease.    Anion gap 6 5 - 15  CBC with Differential/Platelet     Status: None   Collection Time: 07/08/16  4:40 PM  Result Value Ref Range   WBC 5.0 3.4 - 10.8 x10E3/uL   RBC 4.20 3.77 - 5.28 x10E6/uL   Hemoglobin 13.1 11.1 - 15.9 g/dL   Hematocrit 38.3 34.0 - 46.6 %   MCV 91 79 - 97 fL   MCH 31.2 26.6 - 33.0 pg   MCHC 34.2 31.5 - 35.7 g/dL   RDW 13.7 12.3 - 15.4 %   Platelets 183 150 - 379 x10E3/uL   Neutrophils 60 Not Estab. %   Lymphs 27 Not Estab. %   Monocytes 10 Not Estab. %   Eos 3 Not Estab. %   Basos 0 Not Estab. %   Neutrophils Absolute 3.0 1.4 - 7.0 x10E3/uL   Lymphocytes Absolute 1.4 0.7 - 3.1 x10E3/uL   Monocytes  Absolute 0.5 0.1 - 0.9 x10E3/uL   EOS (ABSOLUTE) 0.1 0.0 - 0.4 x10E3/uL   Basophils Absolute 0.0 0.0 - 0.2 x10E3/uL   Immature Granulocytes 0 Not Estab. %   Immature Grans (Abs) 0.0 0.0 - 0.1 x10E3/uL  Urine culture     Status: None   Collection Time: 07/08/16  4:44 PM  Result Value Ref Range   Urine Culture, Routine Final report    Urine Culture result 1 No growth     Assessment/Plan: 1. Cat bite, initial encounter No active infection but giving location of bite and propensity for infection to develop we will begin treatment with Augmentin. [facility] [employer] contacted and case reported. Details given. They are following up with patient as well as bringing  a trap to catch the animal for observation. Giving provoked attack only and that patient is familiar with animal, chance or rabies is less likely. However, recommended patient go to ER for RIG and subsequent shots. Patient refuses at present but agrees to go ASAP if animal cannot be captured today. - amoxicillin-clavulanate (AUGMENTIN) 875-125 MG tablet; Take 1 tablet by mouth 2 (two) times daily.  Dispense: 14 tablet; Refill: 0   Leeanne Rio, [place]

## 2016-07-15 NOTE — Progress Notes (Signed)
Location of Breast Cancer: Left Breast  Histology per Pathology Report:  05/10/16 Diagnosis Breast, left, needle core biopsy, UOQ - INVASIVE DUCTAL CARCINOMA. - DUCTAL CARCINOMA IN SITU WITH CALCIFICATIONS.  Receptor Status: ER(100%), PR (10%), Her2-neu (NEG), Ki-(10%)  06/07/16 Diagnosis 1. Breast, lumpectomy, Left - INVASIVE DUCTAL CARCINOMA, GRADE 1, SPANNING 1.1 CM. - DUCTAL CARCINOMA IN SITU, INTERMEDIATE GRADE. - INVASIVE CARCINOMA COMES TO WITHIN 0.2 CM OF THE ANTERIOR MARGIN FOCALLY. - DUCTAL CARCINOMA IN SITU COMES TO WITHIN 0.3 CM OF THE ANTERIOR MARGIN FOCALLY. - SEE ONCOLOGY TABLE. 2. Lymph node, sentinel, biopsy, Left Axillary #1 - ONE OF ONE LYMPH NODES NEGATIVE FOR CARCINOMA (0/1). 3. Lymph node, sentinel, biopsy, Left Axillary #2 - ONE OF ONE LYMPH NODES NEGATIVE FOR CARCINOMA (0/1).  Receptor Status: ER (100%), PR (10%), Her2-neu (NEG), Ki- (10%)  Did patient present with symptoms or was this found on screening mammography?: She self palpated a lump in her Left Breast, and presented for mammogram.  Past/Anticipated interventions by surgeon, if any: 06/07/16 Procedure:                 Inject blue dye left breast, left breast lumpectomy with radioactive seed localization, left axillary sentinel node biopsy Surgeon:                     Edsel Petrin. Dalbert Batman, M.D., [facility]  Past/Anticipated interventions by medical oncology, if any:  05/21/16 Dr. Lindi Adie Recommendations:Genetic counseling and testing: Patient does not want to wait for the results. (Genetic testing was normal, and did not reveal a deleterious mutation in these genes.) 1. Breast conserving surgery followed by (06/07/16) 2. Oncotype DX testing to determine if chemotherapy would be of any benefit followed by (oncotype 22- no chemotherapy needed) 3. Adjuvant radiation therapy followed by 4. Adjuvant antiestrogen therapy With anastrozole 1 mg daily  Lymphedema issues, if any: She denies.  Pain issues, if any:   She has chronic pain from MS. Today she reports pain a 6/10 in her Arm, hands, and fingers. She reports she may have difficulty holding her arm up during radiation, due to nerve damage. She also reports muscle spasms in her bilateral arms.   SAFETY ISSUES:  Prior radiation? No  Pacemaker/ICD? No  Possible current pregnancy? No  Is the patient on methotrexate? No  Current Complaints / other details:   Patient has a long-standing history of multiple sclerosis and has sensory and motor deficits along with some bladder problems. She is currently on treatment for multiple sclerosis. She tells me that if there are any question related to MS and treatment we can call Dr. Kerman Passey (her MS doctor)  BP 130/81   Pulse 80   Temp 99.4 F (37.4 C)   Ht '5\' 9"'$  (1.753 m)   Wt 122 lb 3.2 oz (55.4 kg)   SpO2 100% Comment: room air  BMI 18.05 kg/m    Wt Readings from Last 3 Encounters:  07/21/16 122 lb 3.2 oz (55.4 kg)  07/14/16 121 lb 2 oz (54.9 kg)  07/08/16 120 lb (54.4 kg)            Denver Harder, Stephani Police, RN 07/15/2016,9:26 AM

## 2016-07-21 ENCOUNTER — Ambulatory Visit
Admission: RE | Admit: 2016-07-21 | Discharge: 2016-07-21 | Disposition: A | Discharge status: Home / Self Care | Payer: BLUE CROSS/BLUE SHIELD | Source: Ambulatory Visit | Attending: Radiation Oncology | Admitting: Radiation Oncology

## 2016-07-21 ENCOUNTER — Encounter: Payer: Self-pay | Admitting: Radiation Oncology

## 2016-07-21 VITALS — BP 130/81 | HR 80 | Temp 99.4°F | Ht 69.0 in | Wt 122.2 lb

## 2016-07-21 DIAGNOSIS — Z170 Estrogen receptor positive status [ER+]: Secondary | ICD-10-CM | POA: Insufficient documentation

## 2016-07-21 DIAGNOSIS — Z510 Encounter for antineoplastic radiation therapy: Secondary | ICD-10-CM | POA: Diagnosis not present

## 2016-07-21 DIAGNOSIS — C50412 Malignant neoplasm of upper-outer quadrant of left female breast: Secondary | ICD-10-CM

## 2016-07-21 NOTE — Progress Notes (Signed)
  Radiation Oncology         [phone] ________________________________  Name: Stacy Logan MRN: [id number]  Date: 07/21/2016  DOB: 1965/10/06  SIMULATION AND TREATMENT PLANNING NOTE    Outpatient  DIAGNOSIS:    Breast cancer of upper-outer quadrant of left female breast ([place]) 174.4 C50.412    NARRATIVE:  The patient was brought to the [facility].  Identity was confirmed.  All relevant records and images related to the planned course of therapy were reviewed.  The patient freely provided informed written consent to proceed with treatment after reviewing the details related to the planned course of therapy. The consent form was witnessed and verified by the simulation staff.    Then, the patient was set-up in a stable reproducible supine position for radiation therapy with her ipsilateral arm over her head, and her upper body secured in a custom-made Vac-lok device.  CT images were obtained.  Surface markings were placed.  The CT images were loaded into the planning software.    TREATMENT PLANNING NOTE: Treatment planning then occurred.  The radiation prescription was entered and confirmed.     A total of 3 medically necessary complex treatment devices were fabricated and supervised by me: 2 fields with MLCs for custom blocks to protect heart, and lungs;  and, a Vac-lok. MORE COMPLEX DEVICES MAY BE MADE IN DOSIMETRY FOR FIELD IN FIELD BEAMS FOR DOSE HOMOGENEITY.  I have requested : 3D Simulation  I have requested a DVH of the following structures: lungs, heart, lumpectomy cavity.    The patient will receive 40.05 Gy in 15 fractions to the left breast with 2 tangential fields.   This will be followed by a boost.  Optical Surface Tracking Plan:  Since intensity modulated radiotherapy (IMRT) and 3D conformal radiation treatment methods are predicated on accurate and precise positioning for treatment, intrafraction motion monitoring is medically necessary to ensure accurate  and safe treatment delivery. The ability to quantify intrafraction motion without excessive ionizing radiation dose can only be performed with optical surface tracking. Accordingly, surface imaging offers the opportunity to obtain 3D measurements of patient position throughout IMRT and 3D treatments without excessive radiation exposure. I am ordering optical surface tracking for this patient's upcoming course of radiotherapy.  ________________________________   Reference:  Ursula Alert, J, et al. Surface imaging-based analysis of intrafraction motion for breast radiotherapy patients.Journal of [place], n. 6, nov. 2014. ISSN [id number].  Available at: <http://www.jacmp.org/index.php/jacmp/article/view/4957>.    -----------------------------------  Eppie Gibson, MD

## 2016-07-21 NOTE — Progress Notes (Signed)
Radiation Oncology         [phone] ________________________________  Name: Stacy Logan MRN: [id number]  Date: 07/21/2016  DOB: 1966/05/26  Follow-Up Visit Note  Outpatient  CC: Piedad Climes, PA-C  Waldon Merl, PA-C  Diagnosis:      ICD-9-CM ICD-10-CM   1. Malignant neoplasm of upper-outer quadrant of left female breast, unspecified estrogen receptor status (HCC) 174.4 C50.412 Ambulatory referral to Social Work    Breast cancer of upper-outer quadrant of left female breast  Clinical Stage IA (T1cN0M0)   Left Breast, UOQ, Invasive Ductal Carcinoma, ER 100% + / PR 10% + / Her2 -, Grade 1-2, pathologic stage T1cN0 (stage IA)  Narrative:  The patient returns today for follow-up.     Since consultation, she underwent left lumpectomy and sentinel lymph node biopsy 06/07/16 with Dr. Derrell Lolling. This revealed grade 1 invasive ductal carcinoma measuring 1.1 cm. The two sentinel lymph nodes were negative. She had genetic testing which was negative. Oncotype score was intermediate and she will not be receiving chemotherapy. She ultimately plans to start Anastrazole after her radiation.     Patient denies lymphedema issues. She notes chronic pain from MS. Today she report 6/10 in her arms, hands, and fingers. She reports she has nerve damage in her arm. She notes that if her arm is in a mold that holds it in place, she should have no difficulty maintaining position. She also reports arm spasms in her bilateral arms.         Of note, patient has a long-standing history of multiple sclerosis and has sensory and motor deficits along with some bladder problems. She is currently on treatment for multiple sclerosis. She states if there are any questions related to MS and treatment we can call Dr. Leilani Merl (her MS doctor).  Quit smoking years ago, she states  ALLERGIES:  is allergic to codeine; lamictal [lamotrigine]; meperidine; meperidine hcl; and morphine.  Meds: Current Outpatient  Prescriptions  Medication Sig Dispense Refill  . albuterol (PROVENTIL HFA;VENTOLIN HFA) 108 (90 Base) MCG/ACT inhaler Inhale 2 puffs into the lungs every 6 (six) hours as needed for wheezing or shortness of breath. 1 Inhaler 0  . amitriptyline (ELAVIL) 25 MG tablet Take 25-50 mg by mouth at bedtime.    . baclofen (LIORESAL) 10 MG tablet Take one pill during the day and two days at night 90 tablet 11  . diazepam (VALIUM) 5 MG tablet Take 1 tablet (5 mg total) by mouth every 8 (eight) hours as needed. For muscle spasms. 90 tablet 3  . Dimethyl Fumarate (TECFIDERA) 240 MG CPDR Take 1 capsule (240 mg total) by mouth 2 (two) times daily. 180 capsule 3  . gabapentin (NEURONTIN) 300 MG capsule Take 1 capsule (300 mg total) by mouth 3 (three) times daily. 90 capsule 11  . Meth-Hyo-M Bl-Na Phos-Ph Sal (URIBEL) 118 MG CAPS Take 1 capsule by mouth 3 (three) times daily as needed (PAIN/UTI).     . naproxen (NAPROSYN) 500 MG tablet Take 1 tablet (500 mg total) by mouth 2 (two) times daily with a meal. 60 tablet 5  . sulfamethoxazole-trimethoprim (BACTRIM DS,SEPTRA DS) 800-160 MG tablet Take 1 tablet by mouth 2 (two) times daily.    . SUMAtriptan (IMITREX) 100 MG tablet Take 1 tablet (100 mg total) by mouth every 2 (two) hours as needed for migraine. May repeat in 2 hours if headache persists or recurs. 10 tablet 6  . valACYclovir (VALTREX) 1000 MG tablet TAKE TWO TABLETS  BY MOUTH EVERY 12 HOURS FOR  2  DOSES  AS  NEEDED  FOR  COLD  SORES 30 tablet 5  . venlafaxine XR (EFFEXOR-XR) 75 MG 24 hr capsule Take 1 capsule (75 mg total) by mouth daily with breakfast. 30 capsule 5  . methylphenidate (RITALIN) 10 MG tablet Take 2 pills in the am and 1 pill at noon 90 tablet 0   No current facility-administered medications for this encounter.     Physical Findings:  height is '5\' 9"'$  (1.753 m) and weight is 122 lb 3.2 oz (55.4 kg). Her temperature is 99.4 F (37.4 C). Her blood pressure is 130/81 and her pulse is 80. Her  oxygen saturation is 100%. .     General: Alert and oriented, in no acute distress HEENT: Head is normocephalic. Extraocular movements are intact. Oropharynx is clear. Neck: Neck is supple, no palpable cervical or supraclavicular lymphadenopathy. Heart: Regular in rate and rhythm with no murmurs, rubs, or gallops. Chest: Clear to auscultation bilaterally, with no rhonchi, wheezes, or rales. Abdomen: Soft, nontender, nondistended, with no rigidity or guarding. No palpable masses. Extremities: No cyanosis or edema. Lymphatics: see Neck Exam Musculoskeletal: symmetric strength and muscle tone throughout. Neurologic: No obvious focalities. Speech is fluent.  Psychiatric: Judgment and insight are intact. Affect is appropriate. Breast exam reveals no palpable masses in the right breast. Lumpectomy scar in the UOQ and axillary scar of the left breast have healed well. No palpable masses in the left breast. Skin over left breast shows satisfactory healing.  Lab Findings: Lab Results  Component Value Date   WBC 5.0 07/08/2016   HGB 12.8 06/03/2016   HCT 38.3 07/08/2016   MCV 91 07/08/2016   PLT 183 07/08/2016    '@LASTCHEMISTRY'$ @  Radiographic Findings: US Thyroid  Result Date: 06/26/2016 CLINICAL DATA:  50 year old female with a history of nodule follow-up EXAM: THYROID ULTRASOUND TECHNIQUE: Ultrasound examination of the thyroid gland and adjacent soft tissues was performed. COMPARISON:  05/01/2015 FINDINGS: Parenchymal Echotexture: Mildly heterogenous Estimated total number of nodules ? 1 cm: 1 Number of spongiform nodules ? 2 cm not described below (TR1): 0 Number of mixed cystic and solid nodules ? 1.5 cm not described below (TR2): 0 _________________________________________________________ Isthmus: 0.3 cm No discrete nodules are identified within the thyroid isthmus. _________________________________________________________ Right lobe: 6.4 cm x 1.8 cm x 2.3 cm Nodule # 1: Location: Right; Mid  Size: 3.1 cm x 1.6 cm x 1.8 cm nodule has decreased in size, with greatest prior measurement 3.3 cm Composition: solid/almost completely solid (2) Echogenicity: isoechoic (1) Shape: not taller-than-wide (0) Margins: smooth (0) Echogenic foci: none (0) ACR TI-RADS total points: 3. ACR TI-RADS risk category: TR3 (3 points). ACR TI-RADS recommendations: Strictly speaking nodule meets criteria for biopsy by a newly established guidelines, however, the size has decreased from the ultrasound of 05/01/2015, which is compatible with benign nodule. _________________________________________________________ Left lobe: 5.9 cm x 1.4 cm x 1.7 cm Nodule # 1: Location: Left; Inferior Size: 0.5 cm x 0.5 cm x 0.5 cm Composition: spongiform (0) Echogenicity: isoechoic (1) Shape: not taller-than-wide (0) Margins: smooth (0) Echogenic foci: none (0) ACR TI-RADS total points: 1. ACR TI-RADS risk category: TR1 (0-1 points). ACR TI-RADS recommendations: Nodule does not meet criteria for surveillance or biopsy IMPRESSION: Mildly heterogeneous thyroid parenchyma, potentially representing medical thyroid disease. Right-sided nodule has decreased in size over time, and although meets criteria for biopsy by newly established guidelines, is most likely benign. Recommendations follow those established by the new  ACR TI-RADS criteria (J Am Coll Radiol 2376;28:315-176). Signed, Dulcy Fanny. Earleen Newport, DO [facility] Radiology Electronically Signed   By: Corrie Mckusick D.O.   On: 06/26/2016 08:26    Impression/Plan: Stage IA left breast cancer ER+ We discussed adjuvant radiotherapy today.  I recommend radiation to her left breast in order to reduce the risk of local regional recurrence by 2/3.  The risks, benefits and side effects of this treatment were discussed in detail.  She understands that radiotherapy is associated with skin irritation and fatigue in the acute setting. Late effects can include  cosmetic changes and rare injury to internal organs.   She is enthusiastic about proceeding with treatment.We will simulate her today.   _____________________________________   Eppie Gibson, MD  This document serves as a record of services personally performed by Eppie Gibson, MD. It was created on her behalf by Bethann Humble, a trained medical scribe. The creation of this record is based on the scribe's personal observations and the provider's statements to them. This document has been checked and approved by the attending provider.

## 2016-07-26 DIAGNOSIS — Z510 Encounter for antineoplastic radiation therapy: Secondary | ICD-10-CM | POA: Diagnosis not present

## 2016-07-27 ENCOUNTER — Encounter: Payer: Self-pay | Admitting: *Deleted

## 2016-07-27 NOTE — Progress Notes (Signed)
[facility] Psychosocial Distress Screening Clinical Social Work  Clinical Social Work was referred by distress screening protocol.  The patient scored a 6 on the Psychosocial Distress Thermometer which indicates moderate distress. Clinical Social Worker contacted patient at home to offer support and assess for distress and other psychosocial needs.  Patient reported feeling "more anxious" today, due to starting radiation tomorrow.  CSW validated patients feelings and CSW and patient discussed common feelings and emotions associated with treatment.  CSW also provided information on the support team and support services at [facility].  Patient expressed interest in seeing the [facility] counseling intern, and was agreeable to CSW competing referral.  CSW provided contact information and encouraged patient to call with any questions or concerns.      ONCBCN DISTRESS SCREENING 07/21/2016  Screening Type Initial Screening  Distress experienced in past week (1-10) 6  Practical problem type Work/school  Emotional problem type Depression;Nervousness/Anxiety;Boredom  Spiritual/Religous concerns type   Information Concerns Type Lack of info about diagnosis;Lack of info about treatment;Lack of info about maintaining fitness  Physical Problem type Pain;Getting around;Mouth sores/swallowing;Loss of appetitie;Talking;Changes in urination;Tingling hands/feet;Skin dry/itchy  Physician notified of physical symptoms Yes    Johnnye Lana, MSW, LCSW, OSW-C Clinical Social Worker [facility] [phone]

## 2016-07-28 ENCOUNTER — Ambulatory Visit
Admission: RE | Admit: 2016-07-28 | Discharge: 2016-07-28 | Disposition: A | Discharge status: Home / Self Care | Payer: BLUE CROSS/BLUE SHIELD | Source: Ambulatory Visit | Attending: Radiation Oncology | Admitting: Radiation Oncology

## 2016-07-28 DIAGNOSIS — Z510 Encounter for antineoplastic radiation therapy: Secondary | ICD-10-CM | POA: Diagnosis not present

## 2016-07-29 ENCOUNTER — Ambulatory Visit
Admission: RE | Admit: 2016-07-29 | Discharge: 2016-07-29 | Disposition: A | Discharge status: Home / Self Care | Payer: BLUE CROSS/BLUE SHIELD | Source: Ambulatory Visit | Attending: Radiation Oncology | Admitting: Radiation Oncology

## 2016-07-29 DIAGNOSIS — Z510 Encounter for antineoplastic radiation therapy: Secondary | ICD-10-CM | POA: Diagnosis not present

## 2016-07-30 ENCOUNTER — Ambulatory Visit
Admission: RE | Admit: 2016-07-30 | Discharge: 2016-07-30 | Disposition: A | Discharge status: Home / Self Care | Payer: BLUE CROSS/BLUE SHIELD | Source: Ambulatory Visit | Attending: Radiation Oncology | Admitting: Radiation Oncology

## 2016-07-30 DIAGNOSIS — Z510 Encounter for antineoplastic radiation therapy: Secondary | ICD-10-CM | POA: Diagnosis not present

## 2016-08-02 ENCOUNTER — Ambulatory Visit
Admission: RE | Admit: 2016-08-02 | Discharge: 2016-08-02 | Disposition: A | Discharge status: Home / Self Care | Payer: BLUE CROSS/BLUE SHIELD | Source: Ambulatory Visit | Attending: Radiation Oncology | Admitting: Radiation Oncology

## 2016-08-02 ENCOUNTER — Encounter: Payer: Self-pay | Admitting: Radiation Oncology

## 2016-08-02 VITALS — BP 116/82 | HR 77 | Temp 98.6°F | Resp 16 | Ht 69.0 in | Wt 125.4 lb

## 2016-08-02 DIAGNOSIS — Z170 Estrogen receptor positive status [ER+]: Secondary | ICD-10-CM

## 2016-08-02 DIAGNOSIS — Z510 Encounter for antineoplastic radiation therapy: Secondary | ICD-10-CM | POA: Diagnosis not present

## 2016-08-02 DIAGNOSIS — C50412 Malignant neoplasm of upper-outer quadrant of left female breast: Secondary | ICD-10-CM | POA: Diagnosis not present

## 2016-08-02 MED ORDER — SONAFINE EX EMUL
1.0000 "application " | Freq: Once | CUTANEOUS | Status: AC
  Administered 2016-08-02: 1 via TOPICAL

## 2016-08-02 MED ORDER — ALRA NON-METALLIC DEODORANT (RAD-ONC)
1.0000 "application " | Freq: Once | TOPICAL | Status: AC
  Administered 2016-08-02: 1 via TOPICAL

## 2016-08-02 NOTE — Progress Notes (Addendum)
Miss Stacy Logan has stable weight and vital signs.  Skin to upper chest with tanning otherwise skin has normal color to the left breast.  Given Sonafine cream and deodorant with instructions.  Reviewed the pertinent information fatigue, hair loss skin changes,breast swelling and breast tenderness.  Education documented in education area of the chart. Wt Readings from Last 3 Encounters:  08/02/16 125 lb 6.4 oz (56.9 kg)  07/21/16 122 lb 3.2 oz (55.4 kg)  07/14/16 121 lb 2 oz (54.9 kg)  BP 116/82   Pulse 77   Temp 98.6 F (37 C) (Oral)   Resp 16   Ht 5\' 9"  (1.753 m)   Wt 125 lb 6.4 oz (56.9 kg)   SpO2 99%   BMI 18.52 kg/m

## 2016-08-02 NOTE — Addendum Note (Signed)
Encounter addended by: Malena Edman, RN on: 08/02/2016  5:36 PM<BR>    Actions taken: MAR administration accepted

## 2016-08-02 NOTE — Progress Notes (Addendum)
   Weekly Management Note:  Outpatient    ICD-9-CM ICD-10-CM   1. Malignant neoplasm of upper-outer quadrant of left breast in female, estrogen receptor positive (HCC) 174.4 C50.412    V86.0 Z17.0     Current Dose:  8.01 Gy  Projected Dose: 50.05 Gy   Narrative:  The patient presents for routine under treatment assessment.  CBCT/MVCT images/Port film x-rays were reviewed.  The chart was checked. Tired   Physical Findings:  height is 5\' 9"  (1.753 m) and weight is 125 lb 6.4 oz (56.9 kg). Her oral temperature is 98.6 F (37 C). Her blood pressure is 116/82 and her pulse is 77. Her respiration is 16 and oxygen saturation is 99%.   Wt Readings from Last 3 Encounters:  08/09/16 121 lb 6.4 oz (55.1 kg)  08/02/16 125 lb 6.4 oz (56.9 kg)  07/21/16 122 lb 3.2 oz (55.4 kg)   No skin irritation over left breast  Impression:  The patient is tolerating radiotherapy.  Plan:  Continue radiotherapy as planned. Patient instructed to apply Sonafine in treatment fields.    ________________________________   Eppie Gibson, M.D.

## 2016-08-03 ENCOUNTER — Ambulatory Visit
Admission: RE | Admit: 2016-08-03 | Discharge: 2016-08-03 | Disposition: A | Discharge status: Home / Self Care | Payer: BLUE CROSS/BLUE SHIELD | Source: Ambulatory Visit | Attending: Radiation Oncology | Admitting: Radiation Oncology

## 2016-08-03 DIAGNOSIS — Z510 Encounter for antineoplastic radiation therapy: Secondary | ICD-10-CM | POA: Diagnosis not present

## 2016-08-04 ENCOUNTER — Ambulatory Visit
Admission: RE | Admit: 2016-08-04 | Discharge: 2016-08-04 | Disposition: A | Discharge status: Home / Self Care | Payer: BLUE CROSS/BLUE SHIELD | Source: Ambulatory Visit | Attending: Radiation Oncology | Admitting: Radiation Oncology

## 2016-08-04 DIAGNOSIS — Z510 Encounter for antineoplastic radiation therapy: Secondary | ICD-10-CM | POA: Diagnosis not present

## 2016-08-05 ENCOUNTER — Ambulatory Visit
Admission: RE | Admit: 2016-08-05 | Discharge: 2016-08-05 | Disposition: A | Discharge status: Home / Self Care | Payer: BLUE CROSS/BLUE SHIELD | Source: Ambulatory Visit | Attending: Radiation Oncology | Admitting: Radiation Oncology

## 2016-08-05 DIAGNOSIS — Z510 Encounter for antineoplastic radiation therapy: Secondary | ICD-10-CM | POA: Diagnosis not present

## 2016-08-06 ENCOUNTER — Ambulatory Visit
Admission: RE | Admit: 2016-08-06 | Discharge: 2016-08-06 | Disposition: A | Discharge status: Home / Self Care | Payer: BLUE CROSS/BLUE SHIELD | Source: Ambulatory Visit | Attending: Radiation Oncology | Admitting: Radiation Oncology

## 2016-08-06 DIAGNOSIS — Z510 Encounter for antineoplastic radiation therapy: Secondary | ICD-10-CM | POA: Diagnosis not present

## 2016-08-08 ENCOUNTER — Ambulatory Visit
Admission: RE | Admit: 2016-08-08 | Discharge: 2016-08-08 | Disposition: A | Discharge status: Home / Self Care | Payer: BLUE CROSS/BLUE SHIELD | Source: Ambulatory Visit | Attending: Radiation Oncology | Admitting: Radiation Oncology

## 2016-08-08 DIAGNOSIS — Z510 Encounter for antineoplastic radiation therapy: Secondary | ICD-10-CM | POA: Diagnosis not present

## 2016-08-09 ENCOUNTER — Ambulatory Visit
Admission: RE | Admit: 2016-08-09 | Discharge: 2016-08-09 | Disposition: A | Discharge status: Home / Self Care | Payer: BLUE CROSS/BLUE SHIELD | Source: Ambulatory Visit | Attending: Radiation Oncology | Admitting: Radiation Oncology

## 2016-08-09 ENCOUNTER — Telehealth: Payer: Self-pay | Admitting: Neurology

## 2016-08-09 ENCOUNTER — Ambulatory Visit: Payer: BLUE CROSS/BLUE SHIELD | Admitting: Radiation Oncology

## 2016-08-09 ENCOUNTER — Encounter: Payer: Self-pay | Admitting: Genetic Counselor

## 2016-08-09 VITALS — BP 122/84 | HR 76 | Temp 99.1°F | Ht 69.0 in | Wt 121.4 lb

## 2016-08-09 DIAGNOSIS — Z170 Estrogen receptor positive status [ER+]: Secondary | ICD-10-CM

## 2016-08-09 DIAGNOSIS — C50412 Malignant neoplasm of upper-outer quadrant of left female breast: Secondary | ICD-10-CM

## 2016-08-09 DIAGNOSIS — Z510 Encounter for antineoplastic radiation therapy: Secondary | ICD-10-CM | POA: Diagnosis not present

## 2016-08-09 MED ORDER — DIAZEPAM 5 MG PO TABS: 5.0000 mg | ORAL_TABLET | Freq: Three times a day (TID) | ORAL | 3 refills | Status: DC | PRN

## 2016-08-09 NOTE — Telephone Encounter (Signed)
Rx. awaiting RAS sig/fim 

## 2016-08-09 NOTE — Progress Notes (Signed)
[name] is here for her 9th fraction of radiation to her Left Breast. She reports fatigue, and is resting/sleeping often during the day. She reports pain in her Left Axilla and Left Nipple. She has some redness to her Left Nipple and around her scars over her Left Breast. She is using the Radiaplex twice daily as directed.    BP 122/84   Pulse 76   Temp 99.1 F (37.3 C)   Ht 5\' 9"  (1.753 m)   Wt 121 lb 6.4 oz (55.1 kg)   SpO2 98% Comment: room air  BMI 17.93 kg/m    Wt Readings from Last 3 Encounters:  08/09/16 121 lb 6.4 oz (55.1 kg)  08/02/16 125 lb 6.4 oz (56.9 kg)  07/21/16 122 lb 3.2 oz (55.4 kg)

## 2016-08-09 NOTE — Telephone Encounter (Signed)
[facility] : diazepam (VALIUM) 5 MG tablet [id number] [place], [place] - [address]

## 2016-08-10 ENCOUNTER — Ambulatory Visit
Admission: RE | Admit: 2016-08-10 | Discharge: 2016-08-10 | Disposition: A | Discharge status: Home / Self Care | Payer: BLUE CROSS/BLUE SHIELD | Source: Ambulatory Visit | Attending: Radiation Oncology | Admitting: Radiation Oncology

## 2016-08-10 DIAGNOSIS — Z510 Encounter for antineoplastic radiation therapy: Secondary | ICD-10-CM | POA: Diagnosis not present

## 2016-08-10 NOTE — Telephone Encounter (Signed)
[facility]ium rx. [facility]

## 2016-08-10 NOTE — Addendum Note (Signed)
Encounter addended by: Eppie Gibson, MD on: 08/10/2016  7:38 AM<BR>    Actions taken: Problem List reviewed, Sign clinical note

## 2016-08-10 NOTE — Progress Notes (Signed)
   Weekly Management Note:  Outpatient    ICD-9-CM ICD-10-CM   1. Malignant neoplasm of upper-outer quadrant of left breast in female, estrogen receptor positive (HCC) 174.4 C50.412    V86.0 Z17.0     Current Dose:  24.03 Gy  Projected Dose: 50.05 Gy   Narrative:  The patient presents for routine under treatment assessment.  CBCT/MVCT images/Port film x-rays were reviewed.  The chart was checked. Tired and less active than baseline.  Uses Sonafine for skin.    Physical Findings:  height is 5\' 9"  (1.753 m) and weight is 121 lb 6.4 oz (55.1 kg). Her temperature is 99.1 F (37.3 C). Her blood pressure is 122/84 and her pulse is 76. Her oxygen saturation is 98%.   Wt Readings from Last 3 Encounters:  08/09/16 121 lb 6.4 oz (55.1 kg)  08/02/16 125 lb 6.4 oz (56.9 kg)  07/21/16 122 lb 3.2 oz (55.4 kg)   Mild erythema over left breast  Impression:  The patient is tolerating radiotherapy.  Plan:  Continue radiotherapy as planned. Patient instructed to continue to apply Sonafine in treatment fields.    ________________________________   Eppie Gibson, M.D.

## 2016-08-11 ENCOUNTER — Ambulatory Visit
Admission: RE | Admit: 2016-08-11 | Discharge: 2016-08-11 | Disposition: A | Discharge status: Home / Self Care | Payer: BLUE CROSS/BLUE SHIELD | Source: Ambulatory Visit | Attending: Radiation Oncology | Admitting: Radiation Oncology

## 2016-08-11 ENCOUNTER — Encounter: Payer: Self-pay | Admitting: Radiation Oncology

## 2016-08-11 DIAGNOSIS — Z510 Encounter for antineoplastic radiation therapy: Secondary | ICD-10-CM | POA: Diagnosis not present

## 2016-08-11 NOTE — Progress Notes (Signed)
Electrom Boost Treatment Planning Note Outpatient  Diagnosis:   Malignant neoplasm of upper-outer quadrant of left female breast, unspecified estrogen receptor status ([place]) 174.4 C50.412    The patient's CT images from her simulation were reviewed to plan her boost treatment to her left breast  lumpectomy cavity.  The boost to the lumpectomy cavity will be delivered with 6MeV electrons, 53mm bolus daily,with an en face field, and custom electron cut out block. Special Port plan approved.  10 Gy in 5 fractions has been prescribed to the 100% isodose line.  -----------------------------------  Eppie Gibson, MD

## 2016-08-13 ENCOUNTER — Ambulatory Visit: Payer: BLUE CROSS/BLUE SHIELD

## 2016-08-16 ENCOUNTER — Encounter: Payer: Self-pay | Admitting: Radiation Oncology

## 2016-08-16 ENCOUNTER — Ambulatory Visit
Admission: RE | Admit: 2016-08-16 | Discharge: 2016-08-16 | Disposition: A | Discharge status: Home / Self Care | Payer: BLUE CROSS/BLUE SHIELD | Source: Ambulatory Visit | Attending: Radiation Oncology | Admitting: Radiation Oncology

## 2016-08-16 VITALS — BP 115/102 | HR 91 | Temp 99.5°F | Ht 69.0 in | Wt 120.6 lb

## 2016-08-16 DIAGNOSIS — Z510 Encounter for antineoplastic radiation therapy: Secondary | ICD-10-CM | POA: Diagnosis not present

## 2016-08-16 DIAGNOSIS — Z170 Estrogen receptor positive status [ER+]: Secondary | ICD-10-CM

## 2016-08-16 DIAGNOSIS — C50412 Malignant neoplasm of upper-outer quadrant of left female breast: Secondary | ICD-10-CM

## 2016-08-16 NOTE — Progress Notes (Signed)
   Weekly Management Note:  Outpatient    ICD-9-CM ICD-10-CM   1. Malignant neoplasm of upper-outer quadrant of left breast in female, estrogen receptor positive (HCC) 174.4 C50.412    V86.0 Z17.0     Current Dose:  32.04 Gy  Projected Dose: 50.05 Gy   Narrative:  The patient presents for routine under treatment assessment.  CBCT/MVCT images/Port film x-rays were reviewed.  The chart was checked. Very tired.  Uses Sonafine for skin.    Physical Findings:  height is 5\' 9"  (1.753 m) and weight is 120 lb 9.6 oz (54.7 kg). Her temperature is 99.5 F (37.5 C). Her blood pressure is 115/102 (abnormal) and her pulse is 91. Her oxygen saturation is 100%.   Wt Readings from Last 3 Encounters:  08/16/16 120 lb 9.6 oz (54.7 kg)  08/09/16 121 lb 6.4 oz (55.1 kg)  08/02/16 125 lb 6.4 oz (56.9 kg)   Mild erythema over left breast - stable exam. Skin intact.  Impression:  The patient is tolerating radiotherapy.  Plan:  Continue radiotherapy as planned. Patient instructed to continue to apply Sonafine in treatment fields.   ________________________________   Eppie Gibson, M.D.

## 2016-08-16 NOTE — Progress Notes (Signed)
Stacy Logan presents for her 12th fraction of radiation to her Left Breast. She denies pain except generalized pain related to her MS. She does report severe fatigue, and has slept frequently over the past few days. Her Left Breast has some mild redness. She does report itching, and is aware that she can use hydrocortisone cream over her Left Breast. She is using sonafine twice daily as directed.   BP (!) 115/102   Pulse 91   Temp 99.5 F (37.5 C)   Ht 5\' 9"  (1.753 m)   Wt 120 lb 9.6 oz (54.7 kg)   SpO2 100% Comment: room air  BMI 17.81 kg/m    Wt Readings from Last 3 Encounters:  08/16/16 120 lb 9.6 oz (54.7 kg)  08/09/16 121 lb 6.4 oz (55.1 kg)  08/02/16 125 lb 6.4 oz (56.9 kg)

## 2016-08-17 ENCOUNTER — Ambulatory Visit
Admission: RE | Admit: 2016-08-17 | Discharge: 2016-08-17 | Disposition: A | Discharge status: Home / Self Care | Payer: BLUE CROSS/BLUE SHIELD | Source: Ambulatory Visit | Attending: Radiation Oncology | Admitting: Radiation Oncology

## 2016-08-17 DIAGNOSIS — Z510 Encounter for antineoplastic radiation therapy: Secondary | ICD-10-CM | POA: Diagnosis not present

## 2016-08-18 ENCOUNTER — Ambulatory Visit
Admission: RE | Admit: 2016-08-18 | Discharge: 2016-08-18 | Disposition: A | Discharge status: Home / Self Care | Payer: BLUE CROSS/BLUE SHIELD | Source: Ambulatory Visit | Attending: Radiation Oncology | Admitting: Radiation Oncology

## 2016-08-18 ENCOUNTER — Encounter: Payer: Self-pay | Admitting: Neurology

## 2016-08-18 ENCOUNTER — Ambulatory Visit (INDEPENDENT_AMBULATORY_CARE_PROVIDER_SITE_OTHER): Payer: BLUE CROSS/BLUE SHIELD | Admitting: Neurology

## 2016-08-18 VITALS — BP 128/56 | HR 80 | Resp 18 | Ht 69.0 in | Wt 121.0 lb

## 2016-08-18 DIAGNOSIS — N3941 Urge incontinence: Secondary | ICD-10-CM

## 2016-08-18 DIAGNOSIS — R200 Anesthesia of skin: Secondary | ICD-10-CM | POA: Diagnosis not present

## 2016-08-18 DIAGNOSIS — R5383 Other fatigue: Secondary | ICD-10-CM | POA: Diagnosis not present

## 2016-08-18 DIAGNOSIS — G245 Blepharospasm: Secondary | ICD-10-CM | POA: Diagnosis not present

## 2016-08-18 DIAGNOSIS — G5139 Clonic hemifacial spasm, unspecified: Secondary | ICD-10-CM

## 2016-08-18 DIAGNOSIS — Z510 Encounter for antineoplastic radiation therapy: Secondary | ICD-10-CM | POA: Diagnosis not present

## 2016-08-18 DIAGNOSIS — G513 Clonic hemifacial spasm: Secondary | ICD-10-CM | POA: Diagnosis not present

## 2016-08-18 DIAGNOSIS — G35 Multiple sclerosis: Secondary | ICD-10-CM | POA: Diagnosis not present

## 2016-08-18 MED ORDER — METHYLPHENIDATE HCL 10 MG PO TABS: ORAL_TABLET | ORAL | 0 refills | Status: DC

## 2016-08-18 NOTE — Progress Notes (Signed)
[facility]  PATIENT: Stacy Logan DOB: May 18, 1966  REFERRING DOCTOR OR PCP:  Annye Asa SOURCE: patient, EMR records from [facility]CP [facility] Neurology.  MRI images on PACS  _________________________________   HISTORICAL  CHIEF COMPLAINT:  Chief Complaint  Patient presents with  . Multiple Sclerosis    Sts. she continues to tolerate Tecfidera well.  She is still undergoing radiation for breast cancer--sts. has 7 tx's left.  Has not had chemo.  Sts.right blepharospasm is improved since starting Botox inj/fim  . Blepharospasm    HISTORY OF PRESENT ILLNESS:  Stacy Logan is a 50 yo woman with multiple sclerosis and h/o compressive myelopathy who also has riht hemifacial spasms/bilateral blepharospasm (R>L).     The eyelid twitching is worse in bright lights and when she is tired. She had her first Botox therapy about 3 months ago and feels that the twitching is about 75-80% better.  She tolerated it well and there were no complications. She does not note any significant eyelid drooping or difficulty with swallowing  MS:   She is currently on Tecfidera for the MS. Last lymphocyte count was in October and was normal. She has some itching but no rash.   She denies stomach upset.   She notes no exacerbations but fatigue is worse.     Breast Ca:   She was diagnosed with breast cancer and had surgery and now is getting radiation.   She feels much more tired since starting the radiation.  Gait/strength/sensation/pain:  Her right leg drags and she stumble.    She notes a tight dysesthetic sensation around the ankles/feet, right = left.      Her right arm feels slightly weak and she noted some fasciculations. EMG showed mild chronic C7 radiculopathy in the past.   Both hands have numbness.  Baclofen helps spasticity but it makes her sleepy.  She only takes 10 mg at bedtime.    Lamotrigine was not tolerated and gabapentin did not help.  Bladder:   She continues to have urinary  frequency and incontinence.   She sees Dr. Kary Kos.   She takes intravaginal valium if she gets spasms there.    Vision/eyes:   She notes some decreased peripheral vision seems symmetric.  Fatigue/sleep:  She is having more fatigue and sleepiness since starting RadRx for her breast cancer.      She sleeps poorly at night waking up a lot due to spasms and leg cramps.    She is tolerating the methylphenidate and feels it helps a little bit. She was unable to tolerate Adderall.  Insurance will not cover Provigil  Mood/cognition:   She has depression and anxiety. She gets agoraphobia when shopping.     She takes Effexor 75 mg with only mild benefit   She notes a lot of difficulty with cognition and attention.  Specifically, organizing is difficult.   She is very forgetful and distractible.   She has trouble following through with tasks.   Ritalin helps a little bit  Migraine:  She reports the migraines are doing better since her Botox 3 months ago.   MS/spine history:   In 2001, she was noted to have left hearing loss and had an MRI of the brain.   She did not have numbness or weakness a that time.  She was told she likely had MS.   She saw a neurologist.  She never had an LP.   She reports she was told she had MS but was  never started on any medication.   Starting about 5-6 years ago, she was noting more difficulty with gait and also noted some memory issues.   She was also having bladder issues with urgency and bladder incontinence x 5 years.      However, she had no health insurance at that time and did not follow up with neurology.   She saw Dr. Tomi Likens last year.   He ordered an MRI of the brian and cervical spine.   She had one enhancing lesion on the spine and one additional lesion.  An LP was ordered but she opted not to do the test.   She was started on tecfidera.   She has had some itching and flushing, especially if she does not take after food.     She also was found to have severe spinal stenosis  and myelopathy.   She was sent to Dr. Arnoldo Morale of Neurosurgery.   She underwent  ACDF from C3-C7 06/2014.    She reports she is having more trouble with her arms since the surgery.   The worse pain is in the 2nd and 3rd fingers but she has altered sensation with some pain in the other fingers as well.   She also has right > left lower neck pain.  Bladder is about the same (maybe slightly better) as it was before surgery.      I have personally reviewed the MRI of the brain dated 01/12/2015 and the MRIs of the cervical spine dated 02/01/2015 and 05/22/2014.   The MRI of the brain shows white matter foci predominantly in the deep white matter but also in the periventricular and subcortical white matter of both hemispheres. The brainstem appears normal. There is no significant atrophy. The MRI of the cervical spine from 2015 showed severe spinal stenosis at C5-C6 and milder spinal stenosis at C6-C7 and C4-C5. There is an enhancing focus within the spinal cord adjacent to C5-C6 and she has another hyperintense focus at C3-C4. On the second MRI, done without contrast, she is status post C3-C7 ACDF.      REVIEW OF SYSTEMS: Constitutional: No fevers, chills, sweats, or change in appetite Eyes: as above Ear, nose and throat: No hearing loss, ear pain, nasal congestion, sore throat Cardiovascular: No chest pain, palpitations Respiratory: No shortness of breath at rest or with exertion.   No wheezes GastrointestinaI: No nausea, vomiting, diarrhea, abdominal pain, fecal incontinence Genitourinary: see above Musculoskeletal: Some neck pain, back pain Integumentary: No rash, pruritus, skin lesions Neurological: as above Psychiatric: Notes depression and anxiety Endocrine: No palpitations, diaphoresis, change in appetite, change in weigh or increased thirst Hematologic/Lymphatic: No anemia, purpura, petechiae. Allergic/Immunologic: No itchy/runny eyes, nasal congestion, recent allergic reactions,  rashes  ALLERGIES: Allergies  Allergen Reactions  . Codeine     Makes her "busy", itchy  . Lamictal [Lamotrigine] Other (See Comments)    Mood changes   . Meperidine Nausea And Vomiting  . Meperidine Hcl Nausea And Vomiting    SEVERE N/V  . Morphine Other (See Comments)    "SKIN CRAWLS"    HOME MEDICATIONS:  Current Outpatient Prescriptions:  .  albuterol (PROVENTIL HFA;VENTOLIN HFA) 108 (90 Base) MCG/ACT inhaler, Inhale 2 puffs into the lungs every 6 (six) hours as needed for wheezing or shortness of breath., Disp: 1 Inhaler, Rfl: 0 .  amitriptyline (ELAVIL) 25 MG tablet, Take 25-50 mg by mouth at bedtime., Disp: , Rfl:  .  baclofen (LIORESAL) 10 MG tablet, Take one pill during  the day and two days at night, Disp: 90 tablet, Rfl: 11 .  diazepam (VALIUM) 5 MG tablet, Take 1 tablet (5 mg total) by mouth every 8 (eight) hours as needed. Take one tablet (5mg ) by mouth every 8 hours as needed for muscle spasms., Disp: 90 tablet, Rfl: 3 .  Dimethyl Fumarate (TECFIDERA) 240 MG CPDR, Take 1 capsule (240 mg total) by mouth 2 (two) times daily., Disp: 180 capsule, Rfl: 3 .  gabapentin (NEURONTIN) 300 MG capsule, Take 1 capsule (300 mg total) by mouth 3 (three) times daily., Disp: 90 capsule, Rfl: 11 .  Meth-Hyo-M Bl-Na Phos-Ph Sal (URIBEL) 118 MG CAPS, Take 1 capsule by mouth 3 (three) times daily as needed (PAIN/UTI). , Disp: , Rfl:  .  methylphenidate (RITALIN) 10 MG tablet, Take 2 pills in the am and 1 pill at noon, Disp: 90 tablet, Rfl: 0 .  naproxen (NAPROSYN) 500 MG tablet, Take 1 tablet (500 mg total) by mouth 2 (two) times daily with a meal., Disp: 60 tablet, Rfl: 5 .  SUMAtriptan (IMITREX) 100 MG tablet, Take 1 tablet (100 mg total) by mouth every 2 (two) hours as needed for migraine. May repeat in 2 hours if headache persists or recurs., Disp: 10 tablet, Rfl: 6 .  valACYclovir (VALTREX) 1000 MG tablet, TAKE TWO TABLETS BY MOUTH EVERY 12 HOURS FOR  2  DOSES  AS  NEEDED  FOR  COLD  SORES,  Disp: 30 tablet, Rfl: 5 .  venlafaxine XR (EFFEXOR-XR) 75 MG 24 hr capsule, Take 1 capsule (75 mg total) by mouth daily with breakfast., Disp: 30 capsule, Rfl: 5 .  Wound Dressings (SONAFINE), Apply 1 application topically 3 (three) times daily., Disp: , Rfl:   PAST MEDICAL HISTORY: Past Medical History:  Diagnosis Date  . Anal fissure   . Anxiety   . Arthritis   . Borderline diabetes   . Cancer [facility])    left breast cancer  . Depression   . Frequency of urination   . H/O cold sores   . History of pneumothorax    1988-- SPONTANEOUS--  RESOLVED W/ CHEST TUBE  . IC (interstitial cystitis)   . Lesion of bladder   . Migraines   . MS (multiple sclerosis) ([place])    MRI showed plague on her brain  . Nocturia   . Preeclampsia 1994  . RSD (reflex sympathetic dystrophy)   . Seasonal allergies   . Urgency of urination     PAST SURGICAL HISTORY: Past Surgical History:  Procedure Laterality Date  . ANTERIOR CERVICAL DECOMPRESSION/DISCECTOMY FUSION 4 LEVELS N/A 07/03/2014   Procedure: ANTERIOR CERVICAL DECOMPRESSION/DISCECTOMY FUSION 4 LEVELS;  Surgeon: Newman Pies, MD;  Location: [place] [facility];  Service: Neurosurgery;  Laterality: N/A;  C3-4 C4-5 C5-6 C6-7 Anterior cervical decompression/diskectomy/fusion/interbody prosthesis/plate  . APPENDECTOMY    . CESAREAN SECTION  1994  . CYSTOSCOPY WITH HYDRODISTENSION AND BIOPSY Bilateral 02/15/2014   Procedure: CYSTOSCOPY  BILATERAL RETROGRADE PYLOGRAM, HYDRODISTENSION, INSTILATION OF MARCAINE AND PYRIDIUM;  Surgeon: Ardis Hughs, MD;  Location: [facility];  Service: Urology;  Laterality: Bilateral;  . D & C HYSTEROSCOPY WITH POLYPECTOMY  12-02-2003  . DILATION AND CURETTAGE OF UTERUS    . DX LAPAROSCOPY/  FULGERATION ENDOMETRIOSIS/  APPENDECTOMY  1985  . RADIOACTIVE SEED GUIDED MASTECTOMY WITH AXILLARY SENTINEL LYMPH NODE BIOPSY Left 06/07/2016   Procedure: BREAST LUMPECTOMY WITH RADIOACTIVE SEED AND SENTINEL LYMPH NODE  BIOPSY, INJECT BLUE DYE LEFT BREAST;  Surgeon: Fanny Skates, MD;  Location:  [facility];  Service: General;  Laterality: Left;  . TONSILLECTOMY AND ADENOIDECTOMY  1972    FAMILY HISTORY: Family History  Problem Relation Age of Onset  . Hypertension Mother   . Diabetes Father   . Prostate cancer Father     dx late 40s  . Lung cancer Father 91    smoker  . Cancer Father     cheek of mouth, dx. between 62 and 56  . Diabetes Paternal Grandmother   . Diabetes Paternal Grandfather   . Aneurysm Maternal Grandmother 76    d. brain aneurysm  . Colon cancer Maternal Grandfather 32  . Diabetes Paternal Uncle     x 4  . Diabetes Paternal Aunt   . Colon cancer Maternal Uncle   . Cancer Maternal Uncle     mouth cancer; +tobacco  . Breast cancer Sister 64    s/p mastectomy  . Breast cancer Maternal Aunt     dx 44s  . Lymphoma Maternal Aunt 83  . Uterine cancer Maternal Aunt     d. late 5s  . Throat cancer Cousin     maternal 1st cousin; lim info  . Cancer Paternal Uncle     NOS cancer  . Breast cancer Cousin     paternal 1st cousin dx 103s  . Esophageal cancer Neg Hx   . Rectal cancer Neg Hx   . Stomach cancer Neg Hx     SOCIAL HISTORY:  Social History   Social History  . Marital status: Single    Spouse name: N/A  . Number of children: 1  . Years of education: N/A   Occupational History  . self-employed house cleaning    Social History Main Topics  . Smoking status: Former Smoker    Packs/day: 0.25    Years: 27.00    Types: Cigarettes    Quit date: 01/17/2011  . Smokeless tobacco: Never Used  . Alcohol use No  . Drug use: No  . Sexual activity: No   Other Topics Concern  . Not on file   Social History Narrative  . No narrative on file     PHYSICAL EXAM  Vitals:   08/18/16 1429  BP: (!) 128/56  Pulse: 80  Resp: 18  Weight: 121 lb (54.9 kg)  Height: 5\' 9"  (1.753 m)    Body mass index is 17.87 kg/m.   General: The patient is a  thin woman in no acute distress   Neurologic Exam  Mental status: The patient is alert and oriented x 3 at the time of the examination. The patient has apparent normal recent and remote memory, with an apparently normal attention span and concentration ability.   Speech is normal.  Cranial nerves: She has right >>  left eyelid twitches and some twitches of right corner of the mouth.   Extraocular movements are full.   There is good facial sensation to soft touch bilaterally.Facial strength is normal.  Trapezius and sternocleidomastoid strength is normal. No dysarthria is noted.  The tongue is midline, and the patient has symmetric elevation of the soft palate. No obvious hearing deficits are noted.  Motor:  Muscle bulk is normal.   Tone is normal. Strength is  4+ / 5 in the right triceps and 4 -/5 intrinsic right hand muscles, 4+ to 5/5 in left arm and 5/5 in legs.   Sensory: Sensory testing shows decreased sensation to touch in left C6 and C7 dermatomes of hand and hyperthenar eminence  on the right.   Coordination: Cerebellar testing reveals symmetric finger-nose-finger.  Gait and station: Station is normal.   Gait is normal. Tandem gait is wide. Romberg is negative.   Reflexes: Deep tendon reflexes are increased bilaterally with spread at the knees but no clonus.       DIAGNOSTIC DATA (LABS, IMAGING, TESTING) - I reviewed patient records, labs, notes, testing and imaging myself where available.  Lab Results  Component Value Date   WBC 5.0 07/08/2016   HGB 12.8 06/03/2016   HCT 38.3 07/08/2016   MCV 91 07/08/2016   PLT 183 07/08/2016      Component Value Date/Time   NA 141 06/03/2016 1500   K 4.1 06/03/2016 1500   CL 105 06/03/2016 1500   CO2 30 06/03/2016 1500   GLUCOSE 94 06/03/2016 1500   BUN 12 06/03/2016 1500   CREATININE 0.73 06/03/2016 1500   CREATININE 0.55 12/26/2014 1042   CALCIUM 9.6 06/03/2016 1500   PROT 7.1 06/03/2016 1500   ALBUMIN 4.1 06/03/2016 1500   AST  21 06/03/2016 1500   ALT 16 06/03/2016 1500   ALKPHOS 75 06/03/2016 1500   BILITOT 0.7 06/03/2016 1500   GFRNONAA >60 06/03/2016 1500   GFRNONAA >89 12/26/2014 1042   GFRAA >60 06/03/2016 1500   GFRAA >89 12/26/2014 1042     __________________________________________________  BOTOX INJECTION  The risks and benefits of Botox injection brain to Mrs. Hetz. We discussed the possibility of ptosis as the most common side effect with injection for blepharospasm and hemifacial spasm.   The following injections were performed using sterile technique.  2.5 Units medial pretarsal orbicularis oculi of the left upper lid 2.5 Units lateral pretarsal orbicularis oculi of the left upper lid 2.5 Units lateral pretarsal orbicularis oculi of the left lower lid 10 Units left frontalis (5 U x 2 spots) 5 Units left buccinator  2.5 Units medial pretarsal orbicularis oculi of the right upper lid 2.5 Units lateral pretarsal orbicularis oculi of the right upper lid 2.5 Units lateral pretarsal orbicularis oculi of the right lower lid 10 Units right frontalis (5 U x 2 spots) 10 Units right buccinator  50 Units wasted   ASSESSMENT AND PLAN  Hemifacial spasm  Blepharospasm  MS (multiple sclerosis) (HCC)  Numbness  Other fatigue  Urge incontinence   1.   Botox injection as above for blepharospasm and hemifacial spasm.  2.   Continue Tecfidera for MS  3.   Renew Ritalin for hypersomnia and MS fatigue 4.   Stay active.   Continue other medications 5.   She will return in 3 months or sooner if there are new or worsening neurologic symptoms.    Samaiyah Howes A. Felecia Shelling, MD, PhD [id number], 0000000 PM Certified in Neurology, Clinical Neurophysiology, Sleep Medicine, Pain Medicine and Neuroimaging  [facility] [address] [place], [place] [address] [phone]

## 2016-08-19 ENCOUNTER — Ambulatory Visit: Payer: BLUE CROSS/BLUE SHIELD

## 2016-08-20 ENCOUNTER — Ambulatory Visit
Admission: RE | Admit: 2016-08-20 | Discharge: 2016-08-20 | Disposition: A | Discharge status: Home / Self Care | Payer: BLUE CROSS/BLUE SHIELD | Source: Ambulatory Visit | Attending: Radiation Oncology | Admitting: Radiation Oncology

## 2016-08-20 ENCOUNTER — Ambulatory Visit: Payer: BLUE CROSS/BLUE SHIELD

## 2016-08-20 DIAGNOSIS — Z510 Encounter for antineoplastic radiation therapy: Secondary | ICD-10-CM | POA: Diagnosis not present

## 2016-08-23 ENCOUNTER — Ambulatory Visit: Payer: BLUE CROSS/BLUE SHIELD | Admitting: Neurology

## 2016-08-23 ENCOUNTER — Ambulatory Visit: Payer: BLUE CROSS/BLUE SHIELD

## 2016-08-23 ENCOUNTER — Ambulatory Visit
Admission: RE | Admit: 2016-08-23 | Discharge: 2016-08-23 | Disposition: A | Discharge status: Home / Self Care | Payer: BLUE CROSS/BLUE SHIELD | Source: Ambulatory Visit | Attending: Radiation Oncology | Admitting: Radiation Oncology

## 2016-08-23 ENCOUNTER — Encounter: Payer: Self-pay | Admitting: Radiation Oncology

## 2016-08-23 VITALS — BP 111/78 | HR 83 | Temp 99.1°F | Ht 69.0 in | Wt 121.2 lb

## 2016-08-23 DIAGNOSIS — Z170 Estrogen receptor positive status [ER+]: Secondary | ICD-10-CM | POA: Diagnosis present

## 2016-08-23 DIAGNOSIS — Z510 Encounter for antineoplastic radiation therapy: Secondary | ICD-10-CM | POA: Diagnosis not present

## 2016-08-23 DIAGNOSIS — C50412 Malignant neoplasm of upper-outer quadrant of left female breast: Secondary | ICD-10-CM | POA: Diagnosis not present

## 2016-08-23 MED ORDER — BIAFINE EX EMUL
Freq: Two times a day (BID) | CUTANEOUS | Status: DC
  Administered 2016-08-23: 16:00:00 via TOPICAL

## 2016-08-23 NOTE — Progress Notes (Signed)
   Weekly Management Note:  Outpatient    ICD-9-CM ICD-10-CM   1. Malignant neoplasm of upper-outer quadrant of left breast in female, estrogen receptor positive (HCC) 174.4 C50.412 topical emolient (BIAFINE) emulsion   V86.0 Z17.0     Current Dose:  42.05 Gy  Projected Dose: 50.05 Gy   Narrative:  The patient presents for routine under treatment assessment.  CBCT/MVCT images/Port film x-rays were reviewed.  The chart was checked. No new issues.  Physical Findings:  height is 5\' 9"  (1.753 m) and weight is 121 lb 3.2 oz (55 kg). Her temperature is 99.1 F (37.3 C). Her blood pressure is 111/78 and her pulse is 83. Her oxygen saturation is 99%.   Wt Readings from Last 3 Encounters:  08/23/16 121 lb 3.2 oz (55 kg)  08/18/16 121 lb (54.9 kg)  08/16/16 120 lb 9.6 oz (54.7 kg)   Moderate erythema over left breast -   Skin intact.  Impression:  The patient is tolerating radiotherapy.  Plan:  Continue radiotherapy as planned. Patient instructed to continue to apply Sonafine in treatment fields.  f/u in 11mo ________________________________   Eppie Gibson, M.D.

## 2016-08-23 NOTE — Addendum Note (Signed)
Encounter addended by: Ernst Spell, RN on: 08/23/2016  3:51 PM<BR>    Actions taken: MAR administration accepted

## 2016-08-23 NOTE — Progress Notes (Signed)
Stacy Logan presents for her 16th fraction of radiation to her Left Breast. She denies pain except chronic pain from her MS. She does report soreness and tingling to her Left Breast. She has fatigue, and will sleep often during the day. Her Left Breast is red, and she reports itching to the upper portion of her Left Breast. She continues to use Sonafine to this area twice daily. I have provided her with a second tube of sonafine today. She was given an one month follow up appointment.  BP 111/78   Pulse 83   Temp 99.1 F (37.3 C)   Ht 5\' 9"  (1.753 m)   Wt 121 lb 3.2 oz (55 kg)   SpO2 99% Comment: room air  BMI 17.90 kg/m    Wt Readings from Last 3 Encounters:  08/23/16 121 lb 3.2 oz (55 kg)  08/18/16 121 lb (54.9 kg)  08/16/16 120 lb 9.6 oz (54.7 kg)

## 2016-08-24 ENCOUNTER — Ambulatory Visit
Admission: RE | Admit: 2016-08-24 | Discharge: 2016-08-24 | Disposition: A | Discharge status: Home / Self Care | Payer: BLUE CROSS/BLUE SHIELD | Source: Ambulatory Visit | Attending: Radiation Oncology | Admitting: Radiation Oncology

## 2016-08-24 DIAGNOSIS — Z510 Encounter for antineoplastic radiation therapy: Secondary | ICD-10-CM | POA: Diagnosis not present

## 2016-08-25 ENCOUNTER — Ambulatory Visit
Admission: RE | Admit: 2016-08-25 | Discharge: 2016-08-25 | Disposition: A | Discharge status: Home / Self Care | Payer: BLUE CROSS/BLUE SHIELD | Source: Ambulatory Visit | Attending: Radiation Oncology | Admitting: Radiation Oncology

## 2016-08-25 DIAGNOSIS — Z510 Encounter for antineoplastic radiation therapy: Secondary | ICD-10-CM | POA: Diagnosis not present

## 2016-08-26 ENCOUNTER — Ambulatory Visit
Admission: RE | Admit: 2016-08-26 | Discharge: 2016-08-26 | Disposition: A | Discharge status: Home / Self Care | Payer: BLUE CROSS/BLUE SHIELD | Source: Ambulatory Visit | Attending: Radiation Oncology | Admitting: Radiation Oncology

## 2016-08-26 ENCOUNTER — Ambulatory Visit: Payer: BLUE CROSS/BLUE SHIELD

## 2016-08-26 DIAGNOSIS — Z510 Encounter for antineoplastic radiation therapy: Secondary | ICD-10-CM | POA: Diagnosis not present

## 2016-08-27 ENCOUNTER — Encounter: Payer: Self-pay | Admitting: Radiation Oncology

## 2016-08-27 ENCOUNTER — Telehealth: Payer: Self-pay | Admitting: *Deleted

## 2016-08-27 ENCOUNTER — Ambulatory Visit
Admission: RE | Admit: 2016-08-27 | Discharge: 2016-08-27 | Disposition: A | Discharge status: Home / Self Care | Payer: BLUE CROSS/BLUE SHIELD | Source: Ambulatory Visit | Attending: Radiation Oncology | Admitting: Radiation Oncology

## 2016-08-27 DIAGNOSIS — Z510 Encounter for antineoplastic radiation therapy: Secondary | ICD-10-CM | POA: Diagnosis not present

## 2016-08-27 NOTE — Telephone Encounter (Signed)
  Oncology Nurse Navigator Documentation  Navigator Location: [facility] (08/27/16 1300)   )Navigator Encounter Type: Telephone (08/27/16 1300) Telephone: Outgoing Call (08/27/16 1300)       Genetic Counseling Date: 05/27/16 (08/27/16 1300) Genetic Counseling Type: Urgent (08/27/16 1300)   Multidisiplinary Clinic Date:  (None) (08/27/16 1300) Multidisiplinary Clinic Type: Breast (08/27/16 1300)   Patient Visit Type: [id number] (08/27/16 1300) Treatment Phase: Final Radiation Tx (08/27/16 1300) Barriers/Navigation Needs: No barriers at this time;No Questions;No Needs (08/27/16 1300)   Interventions: None required (08/27/16 1300)  Called to congratulate on completion of xrt. Relate doing well and without complaints.          Acuity: Level 1 (08/27/16 1300)         Time Spent with Patient: 15 (08/27/16 1300)

## 2016-08-30 ENCOUNTER — Telehealth: Payer: Self-pay | Admitting: Neurology

## 2016-08-30 NOTE — Telephone Encounter (Signed)
Patient had Botox last week and after getting Botox her right eyelid is trying to close and makes it difficult to see. Please call and discuss.

## 2016-08-31 ENCOUNTER — Telehealth: Payer: Self-pay | Admitting: *Deleted

## 2016-08-31 NOTE — Telephone Encounter (Signed)
I spoke with Telecia this morning--she c/o droopy right eyelid onset after Botox inj. No eye pain, visual disturbance.  I have explained this is an unintended consequence of Botox sometimes, and will have to wear off, ,which may take several weeks. RAS updated, sts. will just take time, pt. should use moisturizing eyedrops prn.  He will use less Botox to try to avoid this with next set of inj.   Pt. verbalized understanding of same./fim

## 2016-08-31 NOTE — Telephone Encounter (Signed)
duplicate/fim  

## 2016-09-03 NOTE — Progress Notes (Signed)
  Radiation Oncology         [phone] ________________________________  Name: Stacy Logan MRN: [id number]  Date: 08/27/2016  DOB: Feb 11, 1966  End of Treatment Note  Diagnosis:   Clinical Stage IA (T1cN0M0) LeftBreast, UOQ,Invasive Ductal Carcinoma, ER 100% +/ PR 10% + / Her2 -, Grade 1-2, pathologic stage T1cN0 (stage IA)       ICD-9-CM ICD-10-CM   1. Malignant neoplasm of upper-outer quadrant of left breast in female, estrogen receptor positive (HCC) 174.4 C50.412 topical emolient (BIAFINE) emulsion   V86.0 Z17.0     Indication for treatment:  Curative       Radiation treatment dates:   07/29/2016 to 08/27/2016  Site/dose:    1. The Left breast was treated to 40.05 Gy in 15 fractions at 2.67 Gy per fraction. 2. The Left breast was boosted to 10 Gy in 5 fractions at 2 Gy per fraction.   Beams/energy:    1. 3D // 6X 2. En face // 6 MeV  Narrative: The patient tolerated radiation treatment relatively well. She developed moderate erythema over the left breast with skin intact.   Plan: The patient has completed radiation treatment. The patient will return to radiation oncology clinic for routine followup in one month. I advised them to call or return sooner if they have any questions or concerns related to their recovery or treatment.  -----------------------------------  Eppie Gibson, MD   This document serves as a record of services personally performed by Eppie Gibson, MD. It was created on her behalf by Arlyce Harman, a trained medical scribe. The creation of this record is based on the scribe's personal observations and the provider's statements to them. This document has been checked and approved by the attending provider.

## 2016-09-06 ENCOUNTER — Ambulatory Visit (HOSPITAL_BASED_OUTPATIENT_CLINIC_OR_DEPARTMENT_OTHER): Payer: BLUE CROSS/BLUE SHIELD | Admitting: Hematology and Oncology

## 2016-09-06 ENCOUNTER — Encounter: Payer: Self-pay | Admitting: Hematology and Oncology

## 2016-09-06 DIAGNOSIS — C50412 Malignant neoplasm of upper-outer quadrant of left female breast: Secondary | ICD-10-CM | POA: Diagnosis not present

## 2016-09-06 DIAGNOSIS — Z170 Estrogen receptor positive status [ER+]: Secondary | ICD-10-CM

## 2016-09-06 MED ORDER — LETROZOLE 2.5 MG PO TABS: 2.5000 mg | ORAL_TABLET | Freq: Every day | ORAL | 3 refills | Status: DC

## 2016-09-06 NOTE — Assessment & Plan Note (Signed)
Left lumpectomy 06/07/2016: IDC grade 1, 1.1 cm, DCIS intermediate grade, margins negative, 0/2 lymph nodes negative, T1c N0 stage IA  Genetic testing: Negative Oncotype DX score 22, 14% risk of recurrence with tamoxifen alone, intermediate risk  Adjuvant radiation therapy 07/29/2016 to 08/27/2016 Treatment plan: Adjuvant antiestrogen therapy with anastrozole 1 mg by mouth daily  Anastrozole counseling:We discussed the risks and benefits of anti-estrogen therapy with aromatase inhibitors. These include but not limited to insomnia, hot flashes, mood changes, vaginal dryness, bone density loss, and weight gain. We strongly believe that the benefits far outweigh the risks. Patient understands these risks and consented to starting treatment. Planned treatment duration is 5 years.  Return to clinic in 3 months for toxicity check and follow-up

## 2016-09-06 NOTE — Progress Notes (Signed)
Patient Care Team: Brunetta Jeans, PA-C as PCP - General (Family Medicine) Newman Pies, MD as Consulting Physician (Neurosurgery) Ardis Hughs, MD as Attending Physician (Urology) Arvella Nigh, MD as Consulting Physician (Obstetrics and Gynecology)  DIAGNOSIS:  Encounter Diagnosis  Name Primary?  . Malignant neoplasm of upper-outer quadrant of left breast in female, estrogen receptor positive ([place])     SUMMARY OF ONCOLOGIC HISTORY:   Breast cancer of upper-outer quadrant of left female breast ([place]) (Resolved)   05/10/2016 Initial Diagnosis    Left breast biopsy UOQ: IDC with DCIS, grade 1-2, ER 100%, PR 10%, Ki-67 10%, HER-2 negative ratio 1.59   The    05/10/2016 Mammogram    Left breast: 1.2 cm irregular marginated mass 1:00 position 6 cm from the nipple. Numerous grouped calcifications throughout right breast: Stable compared to prior, Left breast: calcifications stable, T1 cN0 stage IA clinical stage      05/11/2016 Procedure    Genetic testing:negative for mutations within any of 33 genes on the Custom Panel (Breast, Gyn, GI genes plus FLCN due to her personal history of pneumothorax) through [facility]      06/07/2016 Surgery    Left lumpectomy: IDC grade 1, 1.1 cm, DCIS intermediate grade, margins negative, 0/2 lymph nodes negative, T1c N0 stage IA          CHIEF COMPLIANT: Follow-up after radiation therapy  INTERVAL HISTORY: Stacy Logan is a 50 year old with above-mentioned history of left breast cancer treated with lumpectomy in September 2017. She had intermediate risk Oncotype DX score and then received adjuvant radiation therapy. She is recovering very well from the effects of radiation. She had a great trip to [place] prior to radiation therapy started. She is here to discuss starting antiestrogen therapy. She tells me that her multiple sclerosis is acting up currently.  REVIEW OF SYSTEMS:   Constitutional: Denies fevers, chills or  abnormal weight loss Eyes: Denies blurriness of vision Ears, nose, mouth, throat, and face: Denies mucositis or sore throat Respiratory: Denies cough, dyspnea or wheezes Cardiovascular: Denies palpitation, chest discomfort Gastrointestinal:  Denies nausea, heartburn or change in bowel habits Skin: Denies abnormal skin rashes Lymphatics: Denies new lymphadenopathy or easy bruising Neurological: Multiple sclerosis, uses a cane to get around Behavioral/Psych: Mood is stable, no new changes  Extremities: No lower extremity edema Breast:  denies any pain or lumps or nodules in either breasts All other systems were reviewed with the patient and are negative.  I have reviewed the past medical history, past surgical history, social history and family history with the patient and they are unchanged from previous note.  ALLERGIES:  is allergic to codeine; lamictal [lamotrigine]; meperidine; meperidine hcl; and morphine.  MEDICATIONS:  Current Outpatient Prescriptions  Medication Sig Dispense Refill  . albuterol (PROVENTIL HFA;VENTOLIN HFA) 108 (90 Base) MCG/ACT inhaler Inhale 2 puffs into the lungs every 6 (six) hours as needed for wheezing or shortness of breath. 1 Inhaler 0  . amitriptyline (ELAVIL) 25 MG tablet Take 25-50 mg by mouth at bedtime.    . baclofen (LIORESAL) 10 MG tablet Take one pill during the day and two days at night 90 tablet 11  . diazepam (VALIUM) 5 MG tablet Take 1 tablet (5 mg total) by mouth every 8 (eight) hours as needed. Take one tablet (83m) by mouth every 8 hours as needed for muscle spasms. 90 tablet 3  . Dimethyl Fumarate (TECFIDERA) 240 MG CPDR Take 1 capsule (240 mg total) by mouth  2 (two) times daily. 180 capsule 3  . gabapentin (NEURONTIN) 300 MG capsule Take 1 capsule (300 mg total) by mouth 3 (three) times daily. 90 capsule 11  . letrozole (FEMARA) 2.5 MG tablet Take 1 tablet (2.5 mg total) by mouth daily. 90 tablet 3  . Meth-Hyo-M Bl-Na Phos-Ph Sal (URIBEL)  118 MG CAPS Take 1 capsule by mouth 3 (three) times daily as needed (PAIN/UTI).     Marland Kitchen methylphenidate (RITALIN) 10 MG tablet Take 2 pills in the am and 1 pill at noon 90 tablet 0  . naproxen (NAPROSYN) 500 MG tablet Take 1 tablet (500 mg total) by mouth 2 (two) times daily with a meal. 60 tablet 5  . SUMAtriptan (IMITREX) 100 MG tablet Take 1 tablet (100 mg total) by mouth every 2 (two) hours as needed for migraine. May repeat in 2 hours if headache persists or recurs. 10 tablet 6  . valACYclovir (VALTREX) 1000 MG tablet TAKE TWO TABLETS BY MOUTH EVERY 12 HOURS FOR  2  DOSES  AS  NEEDED  FOR  COLD  SORES 30 tablet 5  . venlafaxine XR (EFFEXOR-XR) 75 MG 24 hr capsule Take 1 capsule (75 mg total) by mouth daily with breakfast. 30 capsule 5  . Wound Dressings (SONAFINE) Apply 1 application topically 3 (three) times daily.     No current facility-administered medications for this visit.     PHYSICAL EXAMINATION: ECOG PERFORMANCE STATUS: 1 - Symptomatic but completely ambulatory  Vitals:   09/06/16 1134  BP: 130/79  Pulse: 76  Resp: 18  Temp: 98 F (36.7 C)   Filed Weights   09/06/16 1134  Weight: 122 lb (55.3 kg)    GENERAL:alert, no distress and comfortable SKIN: skin color, texture, turgor are normal, no rashes or significant lesions EYES: normal, Conjunctiva are pink and non-injected, sclera clear OROPHARYNX:no exudate, no erythema and lips, buccal mucosa, and tongue normal  NECK: supple, thyroid normal size, non-tender, without nodularity LYMPH:  no palpable lymphadenopathy in the cervical, axillary or inguinal LUNGS: clear to auscultation and percussion with normal breathing effort HEART: regular rate & rhythm and no murmurs and no lower extremity edema ABDOMEN:abdomen soft, non-tender and normal bowel sounds MUSCULOSKELETAL:no cyanosis of digits and no clubbing  NEURO: Leg weakness from multiple sclerosis EXTREMITIES: No lower extremity edema BREAST: No palpable masses or  nodules in either right or left breasts. No palpable axillary supraclavicular or infraclavicular adenopathy no breast tenderness or nipple discharge. (exam performed in the presence of a chaperone)  LABORATORY DATA:  I have reviewed the data as listed   Chemistry      Component Value Date/Time   NA 141 06/03/2016 1500   K 4.1 06/03/2016 1500   CL 105 06/03/2016 1500   CO2 30 06/03/2016 1500   BUN 12 06/03/2016 1500   CREATININE 0.73 06/03/2016 1500   CREATININE 0.55 12/26/2014 1042      Component Value Date/Time   CALCIUM 9.6 06/03/2016 1500   ALKPHOS 75 06/03/2016 1500   AST 21 06/03/2016 1500   ALT 16 06/03/2016 1500   BILITOT 0.7 06/03/2016 1500       Lab Results  Component Value Date   WBC 5.0 07/08/2016   HGB 12.8 06/03/2016   HCT 38.3 07/08/2016   MCV 91 07/08/2016   PLT 183 07/08/2016   NEUTROABS 3.0 07/08/2016    ASSESSMENT & PLAN:  Malignant neoplasm of upper-outer quadrant of left female breast ([place]) Left lumpectomy 06/07/2016: IDC grade 1, 1.1 cm, DCIS  intermediate grade, margins negative, 0/2 lymph nodes negative, T1c N0 stage IA  Genetic testing: Negative Oncotype DX score 22, 14% risk of recurrence with tamoxifen alone, intermediate risk  Adjuvant radiation therapy 07/29/2016 to 08/27/2016 Treatment plan: Adjuvant antiestrogen therapy with anastrozole 1 mg by mouth daily To start January 2018  Anastrozole counseling:We discussed the risks and benefits of anti-estrogen therapy with aromatase inhibitors. These include but not limited to insomnia, hot flashes, mood changes, vaginal dryness, bone density loss, and weight gain. We strongly believe that the benefits far outweigh the risks. Patient understands these risks and consented to starting treatment. Planned treatment duration is 5 years.  Return to clinic in 3 months for toxicity check and follow-up  No orders of the defined types were placed in this encounter.  The patient has a good understanding  of the overall plan. she agrees with it. she will call with any problems that may develop before the next visit here.   Rulon Eisenmenger, MD 09/06/16

## 2016-09-09 ENCOUNTER — Ambulatory Visit: Payer: BLUE CROSS/BLUE SHIELD | Admitting: Neurology

## 2016-09-28 ENCOUNTER — Encounter: Payer: Self-pay | Admitting: Radiation Oncology

## 2016-10-01 ENCOUNTER — Ambulatory Visit
Admission: RE | Admit: 2016-10-01 | Payer: BLUE CROSS/BLUE SHIELD | Source: Ambulatory Visit | Admitting: Radiation Oncology

## 2016-10-01 HISTORY — DX: Personal history of irradiation: Z92.3

## 2016-10-22 ENCOUNTER — Telehealth: Payer: Self-pay | Admitting: Hematology and Oncology

## 2016-10-22 NOTE — Telephone Encounter (Signed)
sch SCP appt in June per LOS. appt letter mailed 2/2

## 2016-11-11 ENCOUNTER — Telehealth: Payer: Self-pay | Admitting: *Deleted

## 2016-11-11 MED ORDER — DIMETHYL FUMARATE 240 MG PO CPDR: 1.0000 | DELAYED_RELEASE_CAPSULE | Freq: Two times a day (BID) | ORAL | 3 refills | Status: DC

## 2016-11-11 NOTE — Telephone Encounter (Signed)
Tecfidera rx. escribed to [facility] per faxed request/fim

## 2016-11-12 ENCOUNTER — Ambulatory Visit
Admission: RE | Admit: 2016-11-12 | Discharge: 2016-11-12 | Disposition: A | Discharge status: Home / Self Care | Payer: BLUE CROSS/BLUE SHIELD | Source: Ambulatory Visit | Attending: Radiation Oncology | Admitting: Radiation Oncology

## 2016-11-12 ENCOUNTER — Encounter: Payer: Self-pay | Admitting: Radiation Oncology

## 2016-11-12 VITALS — BP 127/83 | HR 87 | Temp 98.6°F | Ht 69.0 in | Wt 119.4 lb

## 2016-11-12 DIAGNOSIS — Z08 Encounter for follow-up examination after completed treatment for malignant neoplasm: Secondary | ICD-10-CM | POA: Insufficient documentation

## 2016-11-12 DIAGNOSIS — Z79811 Long term (current) use of aromatase inhibitors: Secondary | ICD-10-CM | POA: Insufficient documentation

## 2016-11-12 DIAGNOSIS — Z170 Estrogen receptor positive status [ER+]: Secondary | ICD-10-CM | POA: Diagnosis present

## 2016-11-12 DIAGNOSIS — C50412 Malignant neoplasm of upper-outer quadrant of left female breast: Secondary | ICD-10-CM | POA: Diagnosis present

## 2016-11-12 DIAGNOSIS — Z923 Personal history of irradiation: Secondary | ICD-10-CM | POA: Diagnosis not present

## 2016-11-12 DIAGNOSIS — Z853 Personal history of malignant neoplasm of breast: Secondary | ICD-10-CM | POA: Insufficient documentation

## 2016-11-12 NOTE — Addendum Note (Signed)
Encounter addended by: Ernst Spell, RN on: 11/12/2016  3:21 PM<BR>    Actions taken: Charge Capture section accepted

## 2016-11-12 NOTE — Progress Notes (Signed)
Radiation Oncology         (336) [phone] ________________________________  Name: Stacy Logan MRN: [id number]  Date: 11/12/2016  DOB: October 18, 1965  Follow-Up Visit Note  Outpatient  CC: Leeanne Rio, PA-C  Brunetta Jeans, PA-C  Diagnosis and Prior Radiotherapy:    ICD-9-CM ICD-10-CM   1. Malignant neoplasm of upper-outer quadrant of left breast in female, estrogen receptor positive ([place]) 174.4 C50.412    V86.0 Z17.0     Clinical Stage I  T1c,N0,M0 Left Breast UOQ Invasive Ductal Carcinoma, ER/PR Positive, Her2 Negative, Grade 1-2 Pathologic Stage T1cN0 (Stage IA)  07/29/16 - 08/27/16 : Left Breast treated to 50.05 Gy in 20 fractions  CHIEF COMPLAINT: Here for follow-up and surveillance of Left Breast cancer  Narrative:  The patient returns today for routine follow-up of radiation completed 08/27/16.    On review of systems, the patient reports chronic MS pain. She is not getting relief with her current pain medicine regimen. She reports increased energy. She is using castrol oil to her breast area to help with her skin. She also uses it to other areas of her body. She is taking Anastrozole with reports of hot flashes as a side effect.                      ALLERGIES:  is allergic to codeine; lamictal [lamotrigine]; meperidine; meperidine hcl; and morphine.  Meds: Current Outpatient Prescriptions  Medication Sig Dispense Refill  . albuterol (PROVENTIL HFA;VENTOLIN HFA) 108 (90 Base) MCG/ACT inhaler Inhale 2 puffs into the lungs every 6 (six) hours as needed for wheezing or shortness of breath. 1 Inhaler 0  . amitriptyline (ELAVIL) 25 MG tablet Take 25-50 mg by mouth at bedtime.    Marland Kitchen anastrozole (ARIMIDEX) 1 MG tablet Take 1 mg by mouth daily.    . baclofen (LIORESAL) 10 MG tablet Take one pill during the day and two days at night 90 tablet 11  . diazepam (VALIUM) 5 MG tablet Take 1 tablet (5 mg total) by mouth every 8 (eight) hours as needed. Take one tablet ('5mg'$ ) by mouth  every 8 hours as needed for muscle spasms. 90 tablet 3  . Dimethyl Fumarate (TECFIDERA) 240 MG CPDR Take 1 capsule (240 mg total) by mouth 2 (two) times daily. 180 capsule 3  . gabapentin (NEURONTIN) 300 MG capsule Take 1 capsule (300 mg total) by mouth 3 (three) times daily. 90 capsule 11  . letrozole (FEMARA) 2.5 MG tablet Take 1 tablet (2.5 mg total) by mouth daily. 90 tablet 3  . Meth-Hyo-M Bl-Na Phos-Ph Sal (URIBEL) 118 MG CAPS Take 1 capsule by mouth 3 (three) times daily as needed (PAIN/UTI).     Marland Kitchen methylphenidate (RITALIN) 10 MG tablet Take 2 pills in the am and 1 pill at noon 90 tablet 0  . naproxen (NAPROSYN) 500 MG tablet Take 1 tablet (500 mg total) by mouth 2 (two) times daily with a meal. 60 tablet 5  . SUMAtriptan (IMITREX) 100 MG tablet Take 1 tablet (100 mg total) by mouth every 2 (two) hours as needed for migraine. May repeat in 2 hours if headache persists or recurs. 10 tablet 6  . valACYclovir (VALTREX) 1000 MG tablet TAKE TWO TABLETS BY MOUTH EVERY 12 HOURS FOR  2  DOSES  AS  NEEDED  FOR  COLD  SORES 30 tablet 5  . venlafaxine XR (EFFEXOR-XR) 75 MG 24 hr capsule Take 1 capsule (75 mg total) by mouth daily with breakfast.  30 capsule 5  . Wound Dressings (SONAFINE) Apply 1 application topically 3 (three) times daily.     No current facility-administered medications for this encounter.     Physical Findings: The patient is in no acute distress. Patient is alert and oriented.  height is '5\' 9"'$  (1.753 m) and weight is 119 lb 6.4 oz (54.2 kg). Her temperature is 98.6 F (37 C). Her blood pressure is 127/83 and her pulse is 87. Her oxygen saturation is 100%.   Subtle hyperpigmentation noted in treatment fields.   Lab Findings: Lab Results  Component Value Date   WBC 5.0 07/08/2016   HGB 12.8 06/03/2016   HCT 38.3 07/08/2016   MCV 91 07/08/2016   PLT 183 07/08/2016    Radiographic Findings: No results found.  Impression/Plan:  The patient is healing well from the  effects of radiation.  I advised the patient to continue using the moisturizing oil to the skin in the treatment area as needed. I advised her that she could also use Vitamin E oil if she chooses.  The patient will follow up with me on an as needed basis. She will continue to follow with Medical Oncology as indicated, and to receive routine yearly mammograms.  _____________________________________   Eppie Gibson, MD  This document serves as a record of services personally performed by Eppie Gibson, MD. It was created on her behalf by Maryla Morrow, a trained medical scribe. The creation of this record is based on the scribe's personal observations and the provider's statements to them. This document has been checked and approved by the attending provider.

## 2016-11-12 NOTE — Progress Notes (Signed)
Ms. Dante presents for follow up of radiation completed 08/27/16 to her Left Breast. She reports chronic MS pain. She is not getting relief with her current pain medicine regimen. She reports increased energy. Her skin over her Left Breast has healed. There is some hyperpigmentation present. She is using castrol oil to her Breast area to help with her skin. She also uses it to other areas of her body. She is taking Anastrozole with reports of hot flashes as a side effect.   BP 127/83   Pulse 87   Temp 98.6 F (37 C)   Ht 5\' 9"  (1.753 m)   Wt 119 lb 6.4 oz (54.2 kg)   SpO2 100% Comment: room air  BMI 17.63 kg/m    Wt Readings from Last 3 Encounters:  11/12/16 119 lb 6.4 oz (54.2 kg)  09/06/16 122 lb (55.3 kg)  08/23/16 121 lb 3.2 oz (55 kg)

## 2016-11-15 ENCOUNTER — Ambulatory Visit (INDEPENDENT_AMBULATORY_CARE_PROVIDER_SITE_OTHER): Payer: BLUE CROSS/BLUE SHIELD | Admitting: Neurology

## 2016-11-16 ENCOUNTER — Ambulatory Visit (INDEPENDENT_AMBULATORY_CARE_PROVIDER_SITE_OTHER): Payer: BLUE CROSS/BLUE SHIELD | Admitting: Neurology

## 2016-11-16 ENCOUNTER — Encounter: Payer: Self-pay | Admitting: Neurology

## 2016-11-16 VITALS — BP 136/87 | HR 79 | Resp 16 | Ht 69.0 in | Wt 121.0 lb

## 2016-11-16 DIAGNOSIS — G513 Clonic hemifacial spasm: Secondary | ICD-10-CM

## 2016-11-16 DIAGNOSIS — G43009 Migraine without aura, not intractable, without status migrainosus: Secondary | ICD-10-CM | POA: Diagnosis not present

## 2016-11-16 DIAGNOSIS — F418 Other specified anxiety disorders: Secondary | ICD-10-CM | POA: Diagnosis not present

## 2016-11-16 DIAGNOSIS — G245 Blepharospasm: Secondary | ICD-10-CM | POA: Diagnosis not present

## 2016-11-16 DIAGNOSIS — G5139 Clonic hemifacial spasm, unspecified: Secondary | ICD-10-CM

## 2016-11-16 DIAGNOSIS — G35 Multiple sclerosis: Secondary | ICD-10-CM

## 2016-11-16 MED ORDER — METHYLPHENIDATE HCL 20 MG PO TABS: 20.0000 mg | ORAL_TABLET | Freq: Two times a day (BID) | ORAL | 0 refills | Status: DC

## 2016-11-16 NOTE — Progress Notes (Signed)
[facility]  PATIENT: Stacy Logan DOB: 10-05-1965  REFERRING DOCTOR OR PCP:  Annye Asa SOURCE: patient, EMR records from [facility].  MRI images on PACS  _________________________________   HISTORICAL  CHIEF COMPLAINT:  Chief Complaint  Patient presents with  . Multiple Sclerosis    Sts. she continues to tolerate Tecfidera well and denies missed doses.  Sts. she has been under more stress--feels balance is worse, legs feel heavier.  Mult. falls.  Pins and needles sensation all fingers.  2 severe migraines since last ov.  Has completed tx. for breast cancer./fim  . Blepharospasm    HISTORY OF PRESENT ILLNESS:  Natile Selinsky is a 51 yo woman with multiple sclerosis who also has right hemifacial spasms/bilateral blepharospasm (R>L).       Hemifacial spasms/blepharospasm:    Botox therapy is helping some with her right facial and right eye > left eye spasms.    The eyelid twitching is worse in bright lights and when she is tired.  She tolerated it well and there were no complications. She does not note any significant eyelid drooping or difficulty with swallowing  MS:   She is currently on Tecfidera for the MS. Last lymphocyte count was in October and was normal. She has some itching but no rash.   She denies stomach upset.   She notes no definite exacerbations but has more dysesthesias and fatigue.     Neck pain:   She has pain in her neck and it radiates into her arms and hands.   She feels like there is a Hornet's nest in her lower cervical spine    Her right arm feels slightly weak and she noted some fasciculations. EMG showed mild chronic C7 radiculopathy in the past.   Both hands have numbness.  Breast Ca:   She was diagnosed with breast cancer and had surgery and now is don with RadRx and chemo   She feels much more tired since her diagnosis..    Gait/strength/sensation/pain:  She feels gait is worse and feels balance is much worse.   Her right  leg drags and she stumble.    She notes a tight dysesthetic sensation around the ankles/feet, right = left.       Baclofen helps spasticity but it makes her sleepy.  She only takes 10 mg at bedtime.    Lamotrigine was not tolerated and gabapentin did not help.  Bladder:   She continues to have urinary frequency and incontinence.   She also gets a lot of spasms and Dr. Kary Kos has prescribed intravaginal valium.    Vision/eyes:   She notes some decreased peripheral vision seems symmetric.  Fatigue/sleep:  She is having more fatigue and sleepiness since starting RadRx for her breast cancer.    She sleeps poorly at night  due to spasms and leg cramps.    She is tolerating the methylphenidate and feels it helps a little bit. She was unable to tolerate Adderall.  Insurance will not cover Provigil  Mood/cognition:   She has depression and anxiety. She gets agoraphobia when shopping.     She takes Effexor 75 mg with only mild benefit   She notes a lot of difficulty with cognition and attention.  Specifically, organizing is difficult.   She is very forgetful and distractible.   She has trouble following through with tasks.   Ritalin helps a little bit  Migraine:  She reports the migraines are doing better since her Botox 3  months ago.   MS/spine history:   In 2001, she was noted to have left hearing loss and had an MRI of the brain.   She did not have numbness or weakness a that time.  She was told she likely had MS.   She saw a neurologist.  She never had an LP.   She reports she was told she had MS but was never started on any medication.   Starting about 5-6 years ago, she was noting more difficulty with gait and also noted some memory issues.   She was also having bladder issues with urgency and bladder incontinence x 5 years.      However, she had no health insurance at that time and did not follow up with neurology.   She saw Dr. Tomi Likens last year.   He ordered an MRI of the brian and cervical spine.   She  had one enhancing lesion on the spine and one additional lesion.  An LP was ordered but she opted not to do the test.   She was started on tecfidera.   She has had some itching and flushing, especially if she does not take after food.     She also was found to have severe spinal stenosis and myelopathy.   She was sent to Dr. Arnoldo Morale of Neurosurgery.   She underwent  ACDF from C3-C7 06/2014.    She reports she is having more trouble with her arms since the surgery.   The worse pain is in the 2nd and 3rd fingers but she has altered sensation with some pain in the other fingers as well.   She also has right > left lower neck pain.  Bladder is about the same (maybe slightly better) as it was before surgery.      I have personally reviewed the MRI of the brain dated 01/12/2015 and the MRIs of the cervical spine dated 02/01/2015 and 05/22/2014.   The MRI of the brain shows white matter foci predominantly in the deep white matter but also in the periventricular and subcortical white matter of both hemispheres. The brainstem appears normal. There is no significant atrophy. The MRI of the cervical spine from 2015 showed severe spinal stenosis at C5-C6 and milder spinal stenosis at C6-C7 and C4-C5. There is an enhancing focus within the spinal cord adjacent to C5-C6 and she has another hyperintense focus at C3-C4. On the second MRI, done without contrast, she is status post C3-C7 ACDF.      REVIEW OF SYSTEMS: Constitutional: No fevers, chills, sweats, or change in appetite Eyes: as above Ear, nose and throat: No hearing loss, ear pain, nasal congestion, sore throat Cardiovascular: No chest pain, palpitations Respiratory: No shortness of breath at rest or with exertion.   No wheezes GastrointestinaI: No nausea, vomiting, diarrhea, abdominal pain, fecal incontinence Genitourinary: see above Musculoskeletal: Some neck pain, back pain Integumentary: No rash, pruritus, skin lesions Neurological: as  above Psychiatric: Notes depression and anxiety Endocrine: No palpitations, diaphoresis, change in appetite, change in weigh or increased thirst Hematologic/Lymphatic: No anemia, purpura, petechiae. Allergic/Immunologic: No itchy/runny eyes, nasal congestion, recent allergic reactions, rashes  ALLERGIES: Allergies  Allergen Reactions  . Codeine     Makes her "busy", itchy  . Lamictal [Lamotrigine] Other (See Comments)    Mood changes   . Meperidine Nausea And Vomiting  . Meperidine Hcl Nausea And Vomiting    SEVERE N/V  . Morphine Other (See Comments)    "SKIN CRAWLS"    HOME  MEDICATIONS:  Current Outpatient Prescriptions:  .  albuterol (PROVENTIL HFA;VENTOLIN HFA) 108 (90 Base) MCG/ACT inhaler, Inhale 2 puffs into the lungs every 6 (six) hours as needed for wheezing or shortness of breath., Disp: 1 Inhaler, Rfl: 0 .  amitriptyline (ELAVIL) 25 MG tablet, Take 25-50 mg by mouth at bedtime., Disp: , Rfl:  .  anastrozole (ARIMIDEX) 1 MG tablet, Take 1 mg by mouth daily., Disp: , Rfl:  .  baclofen (LIORESAL) 10 MG tablet, Take one pill during the day and two days at night, Disp: 90 tablet, Rfl: 11 .  diazepam (VALIUM) 5 MG tablet, Take 1 tablet (5 mg total) by mouth every 8 (eight) hours as needed. Take one tablet (5mg ) by mouth every 8 hours as needed for muscle spasms., Disp: 90 tablet, Rfl: 3 .  Dimethyl Fumarate (TECFIDERA) 240 MG CPDR, Take 1 capsule (240 mg total) by mouth 2 (two) times daily., Disp: 180 capsule, Rfl: 3 .  gabapentin (NEURONTIN) 300 MG capsule, Take 1 capsule (300 mg total) by mouth 3 (three) times daily., Disp: 90 capsule, Rfl: 11 .  letrozole (FEMARA) 2.5 MG tablet, Take 1 tablet (2.5 mg total) by mouth daily., Disp: 90 tablet, Rfl: 3 .  Meth-Hyo-M Bl-Na Phos-Ph Sal (URIBEL) 118 MG CAPS, Take 1 capsule by mouth 3 (three) times daily as needed (PAIN/UTI). , Disp: , Rfl:  .  methylphenidate (RITALIN) 20 MG tablet, Take 1 tablet (20 mg total) by mouth 2 (two) times  daily with breakfast and lunch., Disp: 60 tablet, Rfl: 0 .  naproxen (NAPROSYN) 500 MG tablet, Take 1 tablet (500 mg total) by mouth 2 (two) times daily with a meal., Disp: 60 tablet, Rfl: 5 .  SUMAtriptan (IMITREX) 100 MG tablet, Take 1 tablet (100 mg total) by mouth every 2 (two) hours as needed for migraine. May repeat in 2 hours if headache persists or recurs., Disp: 10 tablet, Rfl: 6 .  valACYclovir (VALTREX) 1000 MG tablet, TAKE TWO TABLETS BY MOUTH EVERY 12 HOURS FOR  2  DOSES  AS  NEEDED  FOR  COLD  SORES, Disp: 30 tablet, Rfl: 5 .  venlafaxine XR (EFFEXOR-XR) 75 MG 24 hr capsule, Take 1 capsule (75 mg total) by mouth daily with breakfast., Disp: 30 capsule, Rfl: 5 .  Wound Dressings (SONAFINE), Apply 1 application topically 3 (three) times daily., Disp: , Rfl:   PAST MEDICAL HISTORY: Past Medical History:  Diagnosis Date  . Anal fissure   . Anxiety   . Arthritis   . Borderline diabetes   . Cancer [facility])    left breast cancer  . Depression   . Frequency of urination   . H/O cold sores   . History of pneumothorax    1988-- SPONTANEOUS--  RESOLVED W/ CHEST TUBE  . History of radiation therapy 07/29/16- 08/27/16   Left Breast 40.05 Gy in 15 fractions, Left Breast Boost 10 Gy in 5 fractions.   . IC (interstitial cystitis)   . Lesion of bladder   . Migraines   . MS (multiple sclerosis) ([place])    MRI showed plague on her brain  . Nocturia   . Preeclampsia 1994  . RSD (reflex sympathetic dystrophy)   . Seasonal allergies   . Urgency of urination     PAST SURGICAL HISTORY: Past Surgical History:  Procedure Laterality Date  . ANTERIOR CERVICAL DECOMPRESSION/DISCECTOMY FUSION 4 LEVELS N/A 07/03/2014   Procedure: ANTERIOR CERVICAL DECOMPRESSION/DISCECTOMY FUSION 4 LEVELS;  Surgeon: Newman Pies, MD;  Location: [facility];  Service: Neurosurgery;  Laterality: N/A;  C3-4 C4-5 C5-6 C6-7 Anterior cervical decompression/diskectomy/fusion/interbody prosthesis/plate  . APPENDECTOMY     . CESAREAN SECTION  1994  . CYSTOSCOPY WITH HYDRODISTENSION AND BIOPSY Bilateral 02/15/2014   Procedure: CYSTOSCOPY  BILATERAL RETROGRADE PYLOGRAM, HYDRODISTENSION, INSTILATION OF MARCAINE AND PYRIDIUM;  Surgeon: Ardis Hughs, MD;  Location: [facility];  Service: Urology;  Laterality: Bilateral;  . D & C HYSTEROSCOPY WITH POLYPECTOMY  12-02-2003  . DILATION AND CURETTAGE OF UTERUS    . DX LAPAROSCOPY/  FULGERATION ENDOMETRIOSIS/  APPENDECTOMY  1985  . RADIOACTIVE SEED GUIDED MASTECTOMY WITH AXILLARY SENTINEL LYMPH NODE BIOPSY Left 06/07/2016   Procedure: BREAST LUMPECTOMY WITH RADIOACTIVE SEED AND SENTINEL LYMPH NODE BIOPSY, INJECT BLUE DYE LEFT BREAST;  Surgeon: Fanny Skates, MD;  Location: [place];  Service: General;  Laterality: Left;  . TONSILLECTOMY AND ADENOIDECTOMY  1972    FAMILY HISTORY: Family History  Problem Relation Age of Onset  . Hypertension Mother   . Diabetes Father   . Prostate cancer Father     dx late 86s  . Lung cancer Father 40    smoker  . Cancer Father     cheek of mouth, dx. between 41 and 48  . Diabetes Paternal Grandmother   . Diabetes Paternal Grandfather   . Aneurysm Maternal Grandmother 76    d. brain aneurysm  . Colon cancer Maternal Grandfather 9  . Diabetes Paternal Uncle     x 4  . Diabetes Paternal Aunt   . Colon cancer Maternal Uncle   . Cancer Maternal Uncle     mouth cancer; +tobacco  . Breast cancer Sister 65    s/p mastectomy  . Breast cancer Maternal Aunt     dx 75s  . Lymphoma Maternal Aunt 83  . Uterine cancer Maternal Aunt     d. late 81s  . Throat cancer Cousin     maternal 1st cousin; lim info  . Cancer Paternal Uncle     NOS cancer  . Breast cancer Cousin     paternal 1st cousin dx 57s  . Esophageal cancer Neg Hx   . Rectal cancer Neg Hx   . Stomach cancer Neg Hx     SOCIAL HISTORY:  Social History   Social History  . Marital status: Single    Spouse name: N/A  . Number  of children: 1  . Years of education: N/A   Occupational History  . self-employed house cleaning    Social History Main Topics  . Smoking status: Former Smoker    Packs/day: 0.25    Years: 27.00    Types: Cigarettes    Quit date: 01/17/2011  . Smokeless tobacco: Never Used  . Alcohol use No  . Drug use: No  . Sexual activity: No   Other Topics Concern  . Not on file   Social History Narrative  . No narrative on file     PHYSICAL EXAM  Vitals:   11/16/16 1443  BP: 136/87  Pulse: 79  Resp: 16  Weight: 121 lb (54.9 kg)  Height: 5\' 9"  (1.753 m)    Body mass index is 17.87 kg/m.   General: The patient is a thin woman in no acute distress   Neurologic Exam  Mental status: The patient is alert and oriented x 3 at the time of the examination. The patient has apparent normal recent and remote memory, with an apparently normal attention span and concentration ability.  Speech is normal.  Cranial nerves: She has right >>  left eyelid twitches and some twitches of right corner of the mouth.   Extraocular movements are full.   There is good facial sensation to soft touch bilaterally.Facial strength is normal.  Trapezius and sternocleidomastoid strength is normal. No dysarthria is noted.  The tongue is midline, and the patient has symmetric elevation of the soft palate. No obvious hearing deficits are noted.  Motor:  Muscle bulk is normal.   Tone is normal. Strength is  4+ / 5 in the right triceps and 4 -/5 intrinsic right hand muscles, 4+ to 5/5 in left arm and 5/5 in legs.   Sensory: Sensory testing shows decreased sensation to touch in left C6 and C7 dermatomes of hand and hyperthenar eminence on the right.   Coordination: Cerebellar testing reveals symmetric finger-nose-finger.  Gait and station: Station is normal.   Gait is normal. Tandem gait is wide. Romberg is negative.   Reflexes: Deep tendon reflexes are increased bilaterally with spread at the knees but no clonus.        DIAGNOSTIC DATA (LABS, IMAGING, TESTING) - I reviewed patient records, labs, notes, testing and imaging myself where available.  Lab Results  Component Value Date   WBC 5.0 07/08/2016   HGB 12.8 06/03/2016   HCT 38.3 07/08/2016   MCV 91 07/08/2016   PLT 183 07/08/2016      Component Value Date/Time   NA 141 06/03/2016 1500   K 4.1 06/03/2016 1500   CL 105 06/03/2016 1500   CO2 30 06/03/2016 1500   GLUCOSE 94 06/03/2016 1500   BUN 12 06/03/2016 1500   CREATININE 0.73 06/03/2016 1500   CREATININE 0.55 12/26/2014 1042   CALCIUM 9.6 06/03/2016 1500   PROT 7.1 06/03/2016 1500   ALBUMIN 4.1 06/03/2016 1500   AST 21 06/03/2016 1500   ALT 16 06/03/2016 1500   ALKPHOS 75 06/03/2016 1500   BILITOT 0.7 06/03/2016 1500   GFRNONAA >60 06/03/2016 1500   GFRNONAA >89 12/26/2014 1042   GFRAA >60 06/03/2016 1500   GFRAA >89 12/26/2014 1042     __________________________________________________  BOTOX INJECTION  The risks and benefits of Botox injection brain to Mrs. Heynen. We discussed the possibility of ptosis as the most common side effect with injection for blepharospasm and hemifacial spasm.   The following injections were performed using sterile technique.  Right frontalis (2.5 units 3) Left frontalis (2.5 units 3) Procerus and corrugators (2.5 units 3) Orbicularis oculi (2.5 units 2 on the right upper and 2.5 units 1 on the right lower and 2.5 units upper on the left and 2.5 units lower on the left) Lateral canthus (2.5 units on each side)  Masseter (7.5 units 2 on the right) Right zygomaticus (2.5 units) Buccinator (5 units right and 2.5 units left)  Splenius capitis (7.5 units 2) C5 paraspinal (5 units 2) C7 paraspinal (5 units 2) Trapezius (5 units 2)  Total 100 Units    ASSESSMENT AND PLAN  MS (multiple sclerosis) ([facility])  Blepharospasm  Hemifacial spasm  Migraine without aura and without status migrainosus, not intractable  Depression  with anxiety   1.   Botox injection as above for blepharospasm and hemifacial spasm and migraine 2.   Continue Tecfidera for MS  3.   Renew Ritalin but increase to 20 mg x 2 for hypersomnia and MS fatigue 4.   Stay active.   Continue other medications 5.   She will return in 3 months  or sooner if there are new or worsening neurologic symptoms.    Annmarie Plemmons A. Felecia Shelling, MD, PhD [id number], [id number] PM Certified in Neurology, Clinical Neurophysiology, Sleep Medicine, Pain Medicine and Neuroimaging  [facility] [address] [place], [place] [address] [phone]

## 2016-12-06 DIAGNOSIS — R87610 Atypical squamous cells of undetermined significance on cytologic smear of cervix (ASC-US): Secondary | ICD-10-CM | POA: Diagnosis not present

## 2016-12-17 ENCOUNTER — Other Ambulatory Visit: Payer: Self-pay | Admitting: Physician Assistant

## 2016-12-17 ENCOUNTER — Other Ambulatory Visit: Payer: Self-pay | Admitting: Neurology

## 2016-12-17 DIAGNOSIS — J208 Acute bronchitis due to other specified organisms: Principal | ICD-10-CM

## 2016-12-17 DIAGNOSIS — B9689 Other specified bacterial agents as the cause of diseases classified elsewhere: Secondary | ICD-10-CM

## 2016-12-21 NOTE — Telephone Encounter (Signed)
Last OV 07/14/16 (cat bite) imitrex last filled 01/09/16 #10 with 6

## 2016-12-22 ENCOUNTER — Encounter (HOSPITAL_COMMUNITY): Payer: Self-pay

## 2016-12-23 ENCOUNTER — Telehealth: Payer: Self-pay | Admitting: Neurology

## 2016-12-23 DIAGNOSIS — G35 Multiple sclerosis: Secondary | ICD-10-CM

## 2016-12-23 DIAGNOSIS — R1310 Dysphagia, unspecified: Secondary | ICD-10-CM

## 2016-12-23 MED ORDER — AMITRIPTYLINE HCL 25 MG PO TABS: 25.0000 mg | ORAL_TABLET | Freq: Every day | ORAL | 11 refills | Status: DC

## 2016-12-23 NOTE — Telephone Encounter (Signed)
I have spoken with Stacy Logan this morning.  Amitriptyline escribed to [facility] as requested.  She c/o increased difficulty swallowing.  No choking, but having trouble swallowing meds. Per RAS, referral to GI ordered in EPIC/fim

## 2016-12-23 NOTE — Telephone Encounter (Signed)
Pt request refill for amitriptyline (ELAVIL) 25 MG tablet sent to [facility]

## 2017-01-05 ENCOUNTER — Ambulatory Visit (HOSPITAL_BASED_OUTPATIENT_CLINIC_OR_DEPARTMENT_OTHER): Payer: Medicare Other | Admitting: Hematology and Oncology

## 2017-01-05 ENCOUNTER — Encounter: Payer: Self-pay | Admitting: Hematology and Oncology

## 2017-01-05 DIAGNOSIS — C50412 Malignant neoplasm of upper-outer quadrant of left female breast: Secondary | ICD-10-CM | POA: Diagnosis not present

## 2017-01-05 DIAGNOSIS — Z170 Estrogen receptor positive status [ER+]: Secondary | ICD-10-CM | POA: Diagnosis not present

## 2017-01-05 NOTE — Assessment & Plan Note (Signed)
Left lumpectomy 06/07/2016: IDC grade 1, 1.1 cm, DCIS intermediate grade, margins negative, 0/2 lymph nodes negative, T1c N0 stage IA  Genetic testing: Negative Oncotype DX score 22, 14% risk of recurrence with tamoxifen alone, intermediate risk  Adjuvant radiation therapy 07/29/2016 to 08/27/2016 Treatment plan: Adjuvant antiestrogen therapy with anastrozole 1 mg by mouth daily started January 2018  Anastrozole toxicities:  Return to clinic in 6 months for follow-up

## 2017-01-05 NOTE — Progress Notes (Signed)
Patient Care Team: Brunetta Jeans, PA-C as PCP - General (Family Medicine) Newman Pies, MD as Consulting Physician (Neurosurgery) Ardis Hughs, MD as Attending Physician (Urology) Arvella Nigh, MD as Consulting Physician (Obstetrics and Gynecology) Nicholas Lose, MD as Consulting Physician (Hematology and Oncology) Fanny Skates, MD as Consulting Physician (General Surgery) Eppie Gibson, MD as Attending Physician (Radiation Oncology) Gardenia Phlegm, NP as Nurse Practitioner (Hematology and Oncology)  DIAGNOSIS:  Encounter Diagnosis  Name Primary?  . Malignant neoplasm of upper-outer quadrant of left breast in female, estrogen receptor positive ([place])     SUMMARY OF ONCOLOGIC HISTORY:   Breast cancer of upper-outer quadrant of left female breast ([place]) (Resolved)   05/10/2016 Initial Diagnosis    Left breast biopsy UOQ: IDC with DCIS, grade 1-2, ER 100%, PR 10%, Ki-67 10%, HER-2 negative ratio 1.59      05/10/2016 Mammogram    Left breast: 1.2 cm irregular marginated mass 1:00 position 6 cm from the nipple. Numerous grouped calcifications throughout right breast: Stable compared to prior, Left breast: calcifications stable, T1 cN0 stage IA clinical stage      05/11/2016 Procedure    Genetic testing:negative for mutations within any of 33 genes on the Custom Panel (Breast, Gyn, GI genes plus FLCN due to her personal history of pneumothorax) through [facility]      06/07/2016 Surgery    Left lumpectomy: IDC grade 1, 1.1 cm, DCIS intermediate grade, margins negative, 0/2 lymph nodes negative, T1c N0 stage IA       Malignant neoplasm of upper-outer quadrant of left female breast ([place])   05/10/2016 Mammogram    Left breast: 1.2 cm irregular marginated mass 1:00 position 6 cm from the nipple. Numerous grouped calcifications throughout right breast: Stable compared to prior, Left breast: calcifications stable, T1 cN0 stage IA clinical stage      05/10/2016  Initial Diagnosis    Left breast biopsy UOQ: IDC with DCIS, grade 1-2, ER 100%, PR 10%, Ki-67 10%, HER-2 negative ratio 1.59      05/11/2016 Miscellaneous    Genetic testing:negative for mutations within any of 33 genes on the Custom Panel (Breast, Gyn, GI genes plus FLCN due to her personal history of pneumothorax) through [facility]      06/07/2016 Surgery    Left lumpectomy: IDC grade 1, 1.1 cm, DCIS intermediate grade, margins negative, 0/2 lymph nodes negative, T1c N0 stage IA      06/19/2016 Oncotype testing    Oncotype DX score 22, intermediate risk, 14% ROR      07/29/2016 - 08/27/2016 Radiation Therapy    Adjuvant radiation therapy      09/20/2016 -  Anti-estrogen oral therapy    Anastrozole 1 mg daily       CHIEF COMPLIANT: Follow-up on anastrozole therapy  INTERVAL HISTORY: Stacy Logan is a 51 year old with above-mentioned history of left breast cancer treated with lumpectomy and radiation and is currently on anastrozole therapy. She is tolerating it extremely well. She denies any hot flashes or myalgias. The first 2 weeks she took the medication she felt extremely emotionally labile. Since then it has gotten better. She has multiple sclerosis.  REVIEW OF SYSTEMS:   Constitutional: Denies fevers, chills or abnormal weight loss Eyes: Denies blurriness of vision Ears, nose, mouth, throat, and face: Denies mucositis or sore throat Respiratory: Denies cough, dyspnea or wheezes Cardiovascular: Denies palpitation, chest discomfort Gastrointestinal:  Denies nausea, heartburn or change in bowel habits Skin: Denies abnormal skin rashes Lymphatics:  Denies new lymphadenopathy or easy bruising Neurological:Denies numbness, tingling or new weaknesses Behavioral/Psych: Mood is stable, no new changes  Extremities: No lower extremity edema Breast:  denies any pain or lumps or nodules in either breasts All other systems were reviewed with the patient and are negative.  I  have reviewed the past medical history, past surgical history, social history and family history with the patient and they are unchanged from previous note.  ALLERGIES:  is allergic to codeine; lamictal [lamotrigine]; meperidine; meperidine hcl; and morphine.  MEDICATIONS:  Current Outpatient Prescriptions  Medication Sig Dispense Refill  . albuterol (PROVENTIL HFA;VENTOLIN HFA) 108 (90 Base) MCG/ACT inhaler Inhale 2 puffs into the lungs every 6 (six) hours as needed for wheezing or shortness of breath. 1 Inhaler 0  . amitriptyline (ELAVIL) 25 MG tablet Take 1-2 tablets (25-50 mg total) by mouth at bedtime. 60 tablet 11  . anastrozole (ARIMIDEX) 1 MG tablet Take 1 mg by mouth daily.    . baclofen (LIORESAL) 10 MG tablet Take one pill during the day and two days at night 90 tablet 11  . diazepam (VALIUM) 5 MG tablet Take 1 tablet (5 mg total) by mouth every 8 (eight) hours as needed. Take one tablet (45m) by mouth every 8 hours as needed for muscle spasms. 90 tablet 3  . Dimethyl Fumarate (TECFIDERA) 240 MG CPDR Take 1 capsule (240 mg total) by mouth 2 (two) times daily. 180 capsule 3  . gabapentin (NEURONTIN) 300 MG capsule TAKE 1 CAPSULE (300 MG TOTAL) BY MOUTH 3 (THREE) TIMES DAILY. 90 capsule 11  . letrozole (FEMARA) 2.5 MG tablet Take 1 tablet (2.5 mg total) by mouth daily. 90 tablet 3  . Meth-Hyo-M Bl-Na Phos-Ph Sal (URIBEL) 118 MG CAPS Take 1 capsule by mouth 3 (three) times daily as needed (PAIN/UTI).     .Marland Kitchenmethylphenidate (RITALIN) 20 MG tablet Take 1 tablet (20 mg total) by mouth 2 (two) times daily with breakfast and lunch. 60 tablet 0  . naproxen (NAPROSYN) 500 MG tablet Take 1 tablet (500 mg total) by mouth 2 (two) times daily with a meal. 60 tablet 5  . SUMAtriptan (IMITREX) 100 MG tablet Take 1 tablet (100 mg total) by mouth every 2 (two) hours as needed for migraine. May repeat in 2 hours if headache persists or recurs. 10 tablet 6  . valACYclovir (VALTREX) 1000 MG tablet TAKE TWO  TABLETS BY MOUTH EVERY 12 HOURS FOR  2  DOSES  AS  NEEDED  FOR  COLD  SORES 30 tablet 5  . venlafaxine XR (EFFEXOR-XR) 75 MG 24 hr capsule Take 1 capsule (75 mg total) by mouth daily with breakfast. 30 capsule 5  . Wound Dressings (SONAFINE) Apply 1 application topically 3 (three) times daily.     No current facility-administered medications for this visit.     PHYSICAL EXAMINATION: ECOG PERFORMANCE STATUS: 1 - Symptomatic but completely ambulatory  Vitals:   01/05/17 1105  BP: 128/90  Pulse: 82  Resp: 18  Temp: 98.2 F (36.8 C)   Filed Weights   01/05/17 1105  Weight: 117 lb 4.8 oz (53.2 kg)    GENERAL:alert, no distress and comfortable SKIN: skin color, texture, turgor are normal, no rashes or significant lesions EYES: normal, Conjunctiva are pink and non-injected, sclera clear OROPHARYNX:no exudate, no erythema and lips, buccal mucosa, and tongue normal  NECK: supple, thyroid normal size, non-tender, without nodularity LYMPH:  no palpable lymphadenopathy in the cervical, axillary or inguinal LUNGS: clear to auscultation  and percussion with normal breathing effort HEART: regular rate & rhythm and no murmurs and no lower extremity edema ABDOMEN:abdomen soft, non-tender and normal bowel sounds MUSCULOSKELETAL:no cyanosis of digits and no clubbing  NEURO: alert & oriented x 3 with fluent speech, no focal motor/sensory deficits EXTREMITIES: No lower extremity edema  LABORATORY DATA:  I have reviewed the data as listed   Chemistry      Component Value Date/Time   NA 141 06/03/2016 1500   K 4.1 06/03/2016 1500   CL 105 06/03/2016 1500   CO2 30 06/03/2016 1500   BUN 12 06/03/2016 1500   CREATININE 0.73 06/03/2016 1500   CREATININE 0.55 12/26/2014 1042      Component Value Date/Time   CALCIUM 9.6 06/03/2016 1500   ALKPHOS 75 06/03/2016 1500   AST 21 06/03/2016 1500   ALT 16 06/03/2016 1500   BILITOT 0.7 06/03/2016 1500       Lab Results  Component Value Date    WBC 5.0 07/08/2016   HGB 12.8 06/03/2016   HCT 38.3 07/08/2016   MCV 91 07/08/2016   PLT 183 07/08/2016   NEUTROABS 3.0 07/08/2016    ASSESSMENT & PLAN:  Malignant neoplasm of upper-outer quadrant of left female breast ([place]) Left lumpectomy 06/07/2016: IDC grade 1, 1.1 cm, DCIS intermediate grade, margins negative, 0/2 lymph nodes negative, T1c N0 stage IA  Genetic testing: Negative Oncotype DX score 22, 14% risk of recurrence with tamoxifen alone, intermediate risk  Adjuvant radiation therapy 07/29/2016 to 08/27/2016 Treatment plan: Adjuvant antiestrogen therapy with anastrozole 1 mg by mouth daily started January 2018  Anastrozole toxicities: The first 2 weeks patient had mood swings. Since then she has been doing quite well. Denies any hot flashes or myalgias.  Return to clinic in 6 months for follow-up   I spent 25 minutes talking to the patient of which more than half was spent in counseling and coordination of care.  No orders of the defined types were placed in this encounter.  The patient has a good understanding of the overall plan. she agrees with it. she will call with any problems that may develop before the next visit here.   Rulon Eisenmenger, MD 01/05/17

## 2017-01-14 ENCOUNTER — Other Ambulatory Visit: Payer: Self-pay | Admitting: General Surgery

## 2017-01-14 DIAGNOSIS — F329 Major depressive disorder, single episode, unspecified: Secondary | ICD-10-CM | POA: Diagnosis not present

## 2017-01-14 DIAGNOSIS — C50412 Malignant neoplasm of upper-outer quadrant of left female breast: Secondary | ICD-10-CM | POA: Diagnosis not present

## 2017-01-14 DIAGNOSIS — E041 Nontoxic single thyroid nodule: Secondary | ICD-10-CM

## 2017-01-14 DIAGNOSIS — Z803 Family history of malignant neoplasm of breast: Secondary | ICD-10-CM | POA: Diagnosis not present

## 2017-01-14 DIAGNOSIS — Z87891 Personal history of nicotine dependence: Secondary | ICD-10-CM | POA: Diagnosis not present

## 2017-01-14 DIAGNOSIS — N3010 Interstitial cystitis (chronic) without hematuria: Secondary | ICD-10-CM | POA: Diagnosis not present

## 2017-01-14 DIAGNOSIS — G9050 Complex regional pain syndrome I, unspecified: Secondary | ICD-10-CM | POA: Diagnosis not present

## 2017-01-14 DIAGNOSIS — G35 Multiple sclerosis: Secondary | ICD-10-CM | POA: Diagnosis not present

## 2017-01-20 ENCOUNTER — Ambulatory Visit
Admission: RE | Admit: 2017-01-20 | Discharge: 2017-01-20 | Disposition: A | Discharge status: Home / Self Care | Payer: BLUE CROSS/BLUE SHIELD | Source: Ambulatory Visit | Attending: General Surgery | Admitting: General Surgery

## 2017-01-20 DIAGNOSIS — E041 Nontoxic single thyroid nodule: Secondary | ICD-10-CM | POA: Diagnosis not present

## 2017-01-20 MED ORDER — AMITRIPTYLINE HCL 25 MG PO TABS: 25.0000 mg | ORAL_TABLET | Freq: Every day | ORAL | 3 refills | Status: DC

## 2017-01-20 NOTE — Addendum Note (Signed)
Addended by: France Ravens I on: 01/20/2017 08:14 AM   Modules accepted: Orders

## 2017-01-20 NOTE — Telephone Encounter (Signed)
90 day rx. Amitrip escribed to [facility] per faxed request/fim

## 2017-01-21 ENCOUNTER — Encounter: Payer: Self-pay | Admitting: *Deleted

## 2017-02-07 ENCOUNTER — Encounter: Payer: Self-pay | Admitting: Internal Medicine

## 2017-02-07 ENCOUNTER — Ambulatory Visit (INDEPENDENT_AMBULATORY_CARE_PROVIDER_SITE_OTHER): Payer: Medicare Other | Admitting: Internal Medicine

## 2017-02-07 VITALS — BP 110/64 | HR 88 | Ht 69.75 in | Wt 120.0 lb

## 2017-02-07 DIAGNOSIS — R6889 Other general symptoms and signs: Secondary | ICD-10-CM

## 2017-02-07 DIAGNOSIS — R05 Cough: Secondary | ICD-10-CM

## 2017-02-07 DIAGNOSIS — Z860100 Personal history of colon polyps, unspecified: Secondary | ICD-10-CM

## 2017-02-07 DIAGNOSIS — R1310 Dysphagia, unspecified: Secondary | ICD-10-CM

## 2017-02-07 DIAGNOSIS — R0989 Other specified symptoms and signs involving the circulatory and respiratory systems: Secondary | ICD-10-CM

## 2017-02-07 DIAGNOSIS — R1319 Other dysphagia: Secondary | ICD-10-CM

## 2017-02-07 DIAGNOSIS — R059 Cough, unspecified: Secondary | ICD-10-CM

## 2017-02-07 MED ORDER — NA SULFATE-K SULFATE-MG SULF 17.5-3.13-1.6 GM/177ML PO SOLN: ORAL | 0 refills | Status: DC

## 2017-02-07 MED ORDER — PANTOPRAZOLE SODIUM 40 MG PO TBEC: 40.0000 mg | DELAYED_RELEASE_TABLET | Freq: Every day | ORAL | 1 refills | Status: DC

## 2017-02-07 NOTE — Patient Instructions (Addendum)
You have been scheduled for an endoscopy and colonoscopy. Please follow the written instructions given to you at your visit today. Please pick up your prep supplies at the pharmacy within the next 1-3 days. If you use inhalers (even only as needed), please bring them with you on the day of your procedure. Your physician has requested that you go to www.startemmi.com and enter the access code given to you at your visit today. This web site gives a general overview about your procedure. However, you should still follow specific instructions given to you by our office regarding your preparation for the procedure.  We have sent the following medications to your pharmacy for you to pick up at your convenience: Pantoprazole 40 mg daily  Please call our office in 1 month to let us know how the pantoprazole is working for you.  You have been scheduled for a Barium Esophogram at [facility] (1st floor of the hospital) on Thursday, 02/10/17 at 9:00 am. Please arrive 15 minutes prior to your appointment for registration. Make certain not to have anything to eat or drink 3 hours prior to your test. If you need to reschedule for any reason, please contact radiology at [phone] to do so. __________________________________________________________________ A barium swallow is an examination that concentrates on views of the esophagus. This tends to be a double contrast exam (barium and two liquids which, when combined, create a gas to distend the wall of the oesophagus) or single contrast (non-ionic iodine based). The study is usually tailored to your symptoms so a good history is essential. Attention is paid during the study to the form, structure and configuration of the esophagus, looking for functional disorders (such as aspiration, dysphagia, achalasia, motility and reflux) EXAMINATION You may be asked to change into a gown, depending on the type of swallow being performed. A radiologist and  radiographer will perform the procedure. The radiologist will advise you of the type of contrast selected for your procedure and direct you during the exam. You will be asked to stand, sit or lie in several different positions and to hold a small amount of fluid in your mouth before being asked to swallow while the imaging is performed .In some instances you may be asked to swallow barium coated marshmallows to assess the motility of a solid food bolus. The exam can be recorded as a digital or video fluoroscopy procedure. POST PROCEDURE It will take 1-2 days for the barium to pass through your system. To facilitate this, it is important, unless otherwise directed, to increase your fluids for the next 24-48hrs and to resume your normal diet.  This test typically takes about 30 minutes to perform. _______________________________________________________________________  If you are age 91 or older, your body mass index should be between 23-30. Your Body mass index is 17.34 kg/m. If this is out of the aforementioned range listed, please consider follow up with your Primary Care Provider.  If you are age 28 or younger, your body mass index should be between 19-25. Your Body mass index is 17.34 kg/m. If this is out of the aformentioned range listed, please consider follow up with your Primary Care Provider.

## 2017-02-07 NOTE — Progress Notes (Signed)
Patient ID: Stacy Logan, female   DOB: 07/09/1966, 51 y.o.   MRN: [id number] HPI: Stacy Logan is a 51 year old female with a past medical history of adenomatous colon polyps, multiple sclerosis, left breast cancer status post radiation in December 2017, RSD, IC, depression, arthritis, history of cervical disc disease requiring surgery, thyroid nodules who is seen in consultation at the request of Elyn Aquas, PA-C to evaluate dysphagia. She is known to me from colonoscopy which was performed on 05/08/2014. This revealed a 10 mm sigmoid colon polyp removed with hot snare and a 5 mm rectosigmoid polyp that was removed with cold snare. There were large internal and external hemorrhoids and moderate diverticulosis in the left colon. The sigmoid polyp was a tubulovillous adenoma without high-grade dysplasia. The rectal polyp was hyperplastic.  She reports that over the last 3-4 months she has developed solid and liquid food dysphagia. She also endorses a poor appetite and need to force herself to eat. She's been drinking ensure plus daily. She endorses a chronic nonproductive cough with frequent throat clearing. She denies heartburn. Does report some mild early satiety without significant nausea or vomiting. She is not taking an antireflux medication. She is trying to gain weight but has had a difficult time in doing so. She uses Ritalin but rarely. She states she only takes it when I "need to focus". She reports regular bowel movements without blood in her stool or melena. She denies abdominal pain.  Past Medical History:  Diagnosis Date  . Anal fissure   . Anxiety   . Arthritis   . Borderline diabetes   . Cancer [facility])    left breast cancer  . Depression   . Frequency of urination   . H/O cold sores   . History of pneumothorax    1988-- SPONTANEOUS--  RESOLVED W/ CHEST TUBE  . History of radiation therapy 07/29/16- 08/27/16   Left Breast 40.05 Gy in 15 fractions, Left Breast Boost 10 Gy in 5 fractions.    . IC (interstitial cystitis)   . Lesion of bladder   . Migraines   . MS (multiple sclerosis) ([place])    MRI showed plague on her brain  . Nocturia   . Preeclampsia 1994  . RSD (reflex sympathetic dystrophy)   . Seasonal allergies   . Tubular adenoma of colon   . Urgency of urination     Past Surgical History:  Procedure Laterality Date  . ANTERIOR CERVICAL DECOMPRESSION/DISCECTOMY FUSION 4 LEVELS N/A 07/03/2014   Procedure: ANTERIOR CERVICAL DECOMPRESSION/DISCECTOMY FUSION 4 LEVELS;  Surgeon: Newman Pies, MD;  Location: [facility];  Service: Neurosurgery;  Laterality: N/A;  C3-4 C4-5 C5-6 C6-7 Anterior cervical decompression/diskectomy/fusion/interbody prosthesis/plate  . APPENDECTOMY    . CESAREAN SECTION  1994  . CYSTOSCOPY WITH HYDRODISTENSION AND BIOPSY Bilateral 02/15/2014   Procedure: CYSTOSCOPY  BILATERAL RETROGRADE PYLOGRAM, HYDRODISTENSION, INSTILATION OF MARCAINE AND PYRIDIUM;  Surgeon: Ardis Hughs, MD;  [facility];  Service: Urology;  Laterality: Bilateral;  . D & C HYSTEROSCOPY WITH POLYPECTOMY  12-02-2003  . DILATION AND CURETTAGE OF UTERUS    . DX LAPAROSCOPY/  FULGERATION ENDOMETRIOSIS/  APPENDECTOMY  1985  . RADIOACTIVE SEED GUIDED MASTECTOMY WITH AXILLARY SENTINEL LYMPH NODE BIOPSY Left 06/07/2016   Procedure: BREAST LUMPECTOMY WITH RADIOACTIVE SEED AND SENTINEL LYMPH NODE BIOPSY, INJECT BLUE DYE LEFT BREAST;  Surgeon: Fanny Skates, MD;  Location: [facility];  Service: General;  Laterality: Left;  . [place]  Outpatient Medications Prior to Visit  Medication Sig Dispense Refill  . albuterol (PROVENTIL HFA;VENTOLIN HFA) 108 (90 Base) MCG/ACT inhaler Inhale 2 puffs into the lungs every 6 (six) hours as needed for wheezing or shortness of breath. 1 Inhaler 0  . amitriptyline (ELAVIL) 25 MG tablet Take 1-2 tablets (25-50 mg total) by mouth at bedtime. 180 tablet 3  . anastrozole  (ARIMIDEX) 1 MG tablet Take 1 mg by mouth daily.    . baclofen (LIORESAL) 10 MG tablet Take one pill during the day and two days at night 90 tablet 11  . diazepam (VALIUM) 5 MG tablet Take 1 tablet (5 mg total) by mouth every 8 (eight) hours as needed. Take one tablet (5mg ) by mouth every 8 hours as needed for muscle spasms. 90 tablet 3  . Dimethyl Fumarate (TECFIDERA) 240 MG CPDR Take 1 capsule (240 mg total) by mouth 2 (two) times daily. 180 capsule 3  . gabapentin (NEURONTIN) 300 MG capsule TAKE 1 CAPSULE (300 MG TOTAL) BY MOUTH 3 (THREE) TIMES DAILY. 90 capsule 11  . letrozole (FEMARA) 2.5 MG tablet Take 1 tablet (2.5 mg total) by mouth daily. 90 tablet 3  . Meth-Hyo-M Bl-Na Phos-Ph Sal (URIBEL) 118 MG CAPS Take 1 capsule by mouth 3 (three) times daily as needed (PAIN/UTI).     Marland Kitchen methylphenidate (RITALIN) 20 MG tablet Take 1 tablet (20 mg total) by mouth 2 (two) times daily with breakfast and lunch. 60 tablet 0  . naproxen (NAPROSYN) 500 MG tablet Take 1 tablet (500 mg total) by mouth 2 (two) times daily with a meal. 60 tablet 5  . SUMAtriptan (IMITREX) 100 MG tablet Take 1 tablet (100 mg total) by mouth every 2 (two) hours as needed for migraine. May repeat in 2 hours if headache persists or recurs. 10 tablet 6  . valACYclovir (VALTREX) 1000 MG tablet TAKE TWO TABLETS BY MOUTH EVERY 12 HOURS FOR  2  DOSES  AS  NEEDED  FOR  COLD  SORES 30 tablet 5  . venlafaxine XR (EFFEXOR-XR) 75 MG 24 hr capsule Take 1 capsule (75 mg total) by mouth daily with breakfast. 30 capsule 5  . Wound Dressings (SONAFINE) Apply 1 application topically 3 (three) times daily.     No facility-administered medications prior to visit.     Allergies  Allergen Reactions  . Codeine     Makes her "busy", itchy  . Lamictal [Lamotrigine] Other (See Comments)    Mood changes   . Meperidine Nausea And Vomiting  . Meperidine Hcl Nausea And Vomiting    SEVERE N/V  . Morphine Other (See Comments)    "SKIN CRAWLS"     Family History  Problem Relation Age of Onset  . Hypertension Mother   . Diabetes Father   . Prostate cancer Father        dx late 84s  . Lung cancer Father 9       smoker  . Cancer Father        cheek of mouth, dx. between 98 and 3  . Diabetes Paternal Grandmother   . Diabetes Paternal Grandfather   . Aneurysm Maternal Grandmother 76       d. brain aneurysm  . Colon cancer Maternal Grandfather 87  . Diabetes Paternal Uncle        x 4  . Diabetes Paternal Aunt   . Colon cancer Maternal Uncle   . Cancer Maternal Uncle        mouth cancer; +tobacco  .  Breast cancer Sister 44       s/p mastectomy  . Breast cancer Maternal Aunt        dx 18s  . Lymphoma Maternal Aunt 83  . Uterine cancer Maternal Aunt        d. late 48s  . Throat cancer Cousin        maternal 1st cousin; lim info  . Cancer Paternal Uncle        NOS cancer  . Breast cancer Cousin        paternal 1st cousin dx 4s  . Esophageal cancer Neg Hx   . Rectal cancer Neg Hx   . Stomach cancer Neg Hx     Social History  Substance Use Topics  . Smoking status: Former Smoker    Packs/day: 0.25    Years: 27.00    Types: Cigarettes    Quit date: 01/17/2011  . Smokeless tobacco: Never Used  . Alcohol use No    ROS: As per history of present illness, otherwise negative  BP 110/64   Pulse 88   Ht 5' 9.75" (1.772 m)   Wt 120 lb (54.4 kg)   BMI 17.34 kg/m  Constitutional: Well-developed and well-nourished. No distress. HEENT: Normocephalic and atraumatic. Oropharynx is clear and moist. Conjunctivae are normal.  No scleral icterus. Neck: Neck supple. Trachea midline.Well-healed anterior scar Cardiovascular: Normal rate, regular rhythm and intact distal pulses. No M/R/G Pulmonary/chest: Effort normal and breath sounds normal. No wheezing, rales or rhonchi. Abdominal: Soft, thin, nontender, nondistended. Bowel sounds active throughout. There are no masses palpable. No hepatosplenomegaly. Extremities: no  clubbing, cyanosis, or edema Neurological: Alert and oriented to person place and time. Skin: Skin is warm and dry.  Psychiatric: Normal mood and affect. Behavior is normal.  RELEVANT LABS AND IMAGING: CBC    Component Value Date/Time   WBC 5.0 07/08/2016 1640   WBC 4.6 06/03/2016 1500   RBC 4.20 07/08/2016 1640   RBC 4.13 06/03/2016 1500   HGB 12.8 06/03/2016 1500   HCT 38.3 07/08/2016 1640   PLT 183 07/08/2016 1640   MCV 91 07/08/2016 1640   MCH 31.2 07/08/2016 1640   MCH 31.0 06/03/2016 1500   MCHC 34.2 07/08/2016 1640   MCHC 32.9 06/03/2016 1500   RDW 13.7 07/08/2016 1640   LYMPHSABS 1.4 07/08/2016 1640   MONOABS 0.6 06/03/2016 1500   EOSABS 0.1 07/08/2016 1640   BASOSABS 0.0 07/08/2016 1640    CMP     Component Value Date/Time   NA 141 06/03/2016 1500   K 4.1 06/03/2016 1500   CL 105 06/03/2016 1500   CO2 30 06/03/2016 1500   GLUCOSE 94 06/03/2016 1500   BUN 12 06/03/2016 1500   CREATININE 0.73 06/03/2016 1500   CREATININE 0.55 12/26/2014 1042   CALCIUM 9.6 06/03/2016 1500   PROT 7.1 06/03/2016 1500   ALBUMIN 4.1 06/03/2016 1500   AST 21 06/03/2016 1500   ALT 16 06/03/2016 1500   ALKPHOS 75 06/03/2016 1500   BILITOT 0.7 06/03/2016 1500   GFRNONAA >60 06/03/2016 1500   GFRNONAA >89 12/26/2014 1042   GFRAA >60 06/03/2016 1500   GFRAA >89 12/26/2014 1042    ASSESSMENT/PLAN: 52 year old female with a past medical history of adenomatous colon polyps, multiple sclerosis, left breast cancer status post radiation in December 2017, RSD, IC, depression, arthritis, history of cervical disc disease requiring surgery, thyroid nodules who is seen in consultation at the request of Elyn Aquas, PA-C to evaluate dysphagia.  1. Esophageal dysphagia/LPR  symptoms -- she is endorsing symptoms consistent with LPR such as cough, throat clearing and mild hoarseness. She is also had prior cervical neck surgery and is having thyroid nodules evaluated. I recommended that we try  pantoprazole 40 mg once daily 1 month to see if this helps her LPR symptoms. Of also recommended barium esophagram with tablet to evaluate esophageal motility but also exclude stricture and mass lesions. Her history of radiation to the left breast is noted. We will follow barium esophagram with upper endoscopy. We discussed the risk benefits and alternatives and she wishes to proceed.  2. History of tubulovillous adenoma of the colon -- 3 year surveillance colonoscopy due this year. We will proceed with surveillance colonoscopy at the time of her upper endoscopy. We discussed the risks, benefits and alternatives and she wishes to proceed.  She was given samples of SandiShake weight gain formula to help her gain weight. If she likes this and we continue to have sample she is welcome to return and request more samples.    HW:YSHUOH, Luanna Cole, [place] [address] [place], [place] [address]

## 2017-02-10 ENCOUNTER — Ambulatory Visit (HOSPITAL_COMMUNITY)
Admission: RE | Admit: 2017-02-10 | Discharge: 2017-02-10 | Disposition: A | Discharge status: Home / Self Care | Payer: Medicare Other | Source: Ambulatory Visit | Attending: Internal Medicine | Admitting: Internal Medicine

## 2017-02-10 DIAGNOSIS — R6889 Other general symptoms and signs: Secondary | ICD-10-CM | POA: Insufficient documentation

## 2017-02-10 DIAGNOSIS — R1319 Other dysphagia: Secondary | ICD-10-CM

## 2017-02-10 DIAGNOSIS — R05 Cough: Secondary | ICD-10-CM | POA: Diagnosis not present

## 2017-02-10 DIAGNOSIS — R4702 Dysphasia: Secondary | ICD-10-CM | POA: Diagnosis not present

## 2017-02-10 DIAGNOSIS — R1310 Dysphagia, unspecified: Secondary | ICD-10-CM | POA: Insufficient documentation

## 2017-02-10 DIAGNOSIS — R0989 Other specified symptoms and signs involving the circulatory and respiratory systems: Secondary | ICD-10-CM

## 2017-02-10 DIAGNOSIS — R059 Cough, unspecified: Secondary | ICD-10-CM

## 2017-02-16 ENCOUNTER — Ambulatory Visit: Payer: BLUE CROSS/BLUE SHIELD | Admitting: Neurology

## 2017-02-17 ENCOUNTER — Encounter: Payer: Self-pay | Admitting: Internal Medicine

## 2017-02-18 ENCOUNTER — Telehealth: Payer: Self-pay | Admitting: Neurology

## 2017-02-18 NOTE — Telephone Encounter (Signed)
I called to check status of the patients medication at the pharmacy. They stated that the medication was still pending insurance verification. I spoke with representative Gabriel Cirri, she stated that it was initiated on the 29th and should take 3-5 business days. I asked her if we could mark it as urgent and she stated that it was already marked urgent. After looking again she realized it was not marked urgent and changed it to reflect that status.

## 2017-02-21 NOTE — Telephone Encounter (Signed)
I called patient to make her aware but she did not answer. Her VM was full and I was unable to leave a message.

## 2017-02-21 NOTE — Telephone Encounter (Signed)
I called and spoke with the pharmacy regarding the patients medication. They stated that it was pending with insurance verification because it was a new month. She transferred me to the insurance department so they could verify insurance. We are now awaiting patient consent.

## 2017-02-22 NOTE — Telephone Encounter (Signed)
I called to check status of botox, patient still hasnt given consent. I called and spoke with the patient, gave her the number to the speciality pharmacy.

## 2017-02-23 ENCOUNTER — Ambulatory Visit (INDEPENDENT_AMBULATORY_CARE_PROVIDER_SITE_OTHER): Payer: BLUE CROSS/BLUE SHIELD | Admitting: Neurology

## 2017-02-23 ENCOUNTER — Encounter: Payer: Self-pay | Admitting: Neurology

## 2017-02-23 ENCOUNTER — Telehealth: Payer: Self-pay | Admitting: Neurology

## 2017-02-23 VITALS — BP 108/70 | HR 80 | Resp 16 | Ht 69.75 in | Wt 119.0 lb

## 2017-02-23 DIAGNOSIS — M25521 Pain in right elbow: Secondary | ICD-10-CM

## 2017-02-23 DIAGNOSIS — Z8781 Personal history of (healed) traumatic fracture: Secondary | ICD-10-CM

## 2017-02-23 DIAGNOSIS — R5383 Other fatigue: Secondary | ICD-10-CM

## 2017-02-23 DIAGNOSIS — G5139 Clonic hemifacial spasm, unspecified: Secondary | ICD-10-CM

## 2017-02-23 DIAGNOSIS — G43009 Migraine without aura, not intractable, without status migrainosus: Secondary | ICD-10-CM

## 2017-02-23 DIAGNOSIS — G513 Clonic hemifacial spasm: Secondary | ICD-10-CM | POA: Diagnosis not present

## 2017-02-23 DIAGNOSIS — S92352B Displaced fracture of fifth metatarsal bone, left foot, initial encounter for open fracture: Secondary | ICD-10-CM | POA: Insufficient documentation

## 2017-02-23 DIAGNOSIS — G8929 Other chronic pain: Secondary | ICD-10-CM | POA: Diagnosis not present

## 2017-02-23 DIAGNOSIS — R269 Unspecified abnormalities of gait and mobility: Secondary | ICD-10-CM | POA: Diagnosis not present

## 2017-02-23 DIAGNOSIS — G35 Multiple sclerosis: Secondary | ICD-10-CM

## 2017-02-23 DIAGNOSIS — G35D Multiple sclerosis, unspecified: Secondary | ICD-10-CM

## 2017-02-23 DIAGNOSIS — G245 Blepharospasm: Secondary | ICD-10-CM

## 2017-02-23 MED ORDER — METHYLPHENIDATE HCL 20 MG PO TABS: 20.0000 mg | ORAL_TABLET | Freq: Two times a day (BID) | ORAL | 0 refills | Status: DC

## 2017-02-23 NOTE — Addendum Note (Signed)
Addended by: Arlice Colt A on: 02/23/2017 03:40 PM   Modules accepted: Level of Service

## 2017-02-23 NOTE — Telephone Encounter (Signed)
Per Dr. Felecia Shelling, pt needs botox in 3 mos.

## 2017-02-23 NOTE — Progress Notes (Signed)
[facility]  PATIENT: Stacy Logan DOB: 1966/02/24  REFERRING DOCTOR OR PCP:  Annye Asa SOURCE: patient, EMR records from [facility].  MRI images on PACS  _________________________________   HISTORICAL  CHIEF COMPLAINT:  Chief Complaint  Patient presents with  . Multiple Sclerosis    Here today for Botox.  100 u.  Specialty Pharmacy ([facility]).  Lot# [id number] Exp. 07/2019./fim  . Blepharospasm    HISTORY OF PRESENT ILLNESS:  Stacy Logan is a 51 yo woman with multiple sclerosis who also has right hemifacial spasms/bilateral blepharospasm (R>L).       Hemifacial spasms/blepharospasm:    She reports that the right hemifacial spasms and blepharospasm improved after the Botox therapy.   She did note some right facial drooping that started about a week after the last Botox therapy but has since resolved. Her blepharospasm is worse in bright lights.  She tolerated it well and there were no complications. She does not note any difficulty with swallowing  MS:   She feels her MS is essentially stable. She is remaining on Tecfidera and tolerates it well. She has not had any definite exacerbations since starting the medication. She does have some fluctuating symptoms especially with her gait and fatigue.   Migraine/Neck pain:   She reports that the migraines and neck pain also improved with the Botox therapy. At times the neck pain radiating to the right arm.[facility] Kitchen EMG showed mild chronic C7 radiculopathy in the past.   Both hands have numbness.  Gait/strength/sensation/pain:  She feels gait and balance worsened over the last 6 months. We discussed physical therapy for these issues. In general the right leg is weaker than the left and sometimes drags. She only has a tight dysesthetic sensation in both feet and ankles. Baclofen has helped the spasticity. However, makes her sleepy which limits the dose. She just takes at bedtime. Lamotrigine and gabapentin did  not help.   Bladder:   She has urinary frequency and urgency with occasional incontinence. This is mostly stable.  Vision/eyes:   She feels her vision is stable with no new blurriness and no changes in color vision. No eye pain..  Fatigue/sleep:  She continues to report fatigue. This is mostly stable. She felt a little worse during her radiation treatment.    She sleeps poorly at night  due to spasms and leg cramps. Sleep maintenance issues are worse in sleep onset.   Methylphenidate continues to help her fatigue and daytime sleepiness.   Mood/cognition:   She feels that her depression and anxiety have improved since she was told that her breast cancer is doing well.   She takes Effexor 75 mg with only mild benefit   She notes a lot of difficulty with cognition and attention.  Specifically, organizing is difficult.   She is forgetful and easily distracted but this is stable..   She has trouble following through with tasks.   Ritalin helps a little bit  Breast Ca:   She was diagnosed with breast cancer and had surgery and now is don with RadRx and chemo   She feels much more tired since her diagnosis..     MS/spine history:   In 2001, she was noted to have left hearing loss and had an MRI of the brain.   She did not have numbness or weakness a that time.  She was told she likely had MS.   She saw a neurologist.  She never had an LP.  She reports she was told she had MS but was never started on any medication.   Starting about 5-6 years ago, she was noting more difficulty with gait and also noted some memory issues.   She was also having bladder issues with urgency and bladder incontinence x 5 years.      However, she had no health insurance at that time and did not follow up with neurology.   She saw Dr. Tomi Likens last year.   He ordered an MRI of the brian and cervical spine.   She had one enhancing lesion on the spine and one additional lesion.  An LP was ordered but she opted not to do the test.   She was  started on tecfidera.   She has had some itching and flushing, especially if she does not take after food.     She also was found to have severe spinal stenosis and myelopathy.   She was sent to Dr. Arnoldo Morale of Neurosurgery.   She underwent  ACDF from C3-C7 06/2014.    She reports she is having more trouble with her arms since the surgery.   The worse pain is in the 2nd and 3rd fingers but she has altered sensation with some pain in the other fingers as well.   She also has right > left lower neck pain.  Bladder is about the same (maybe slightly better) as it was before surgery.      I have personally reviewed the MRI of the brain dated 01/12/2015 and the MRIs of the cervical spine dated 02/01/2015 and 05/22/2014.   The MRI of the brain shows white matter foci predominantly in the deep white matter but also in the periventricular and subcortical white matter of both hemispheres. The brainstem appears normal. There is no significant atrophy. The MRI of the cervical spine from 2015 showed severe spinal stenosis at C5-C6 and milder spinal stenosis at C6-C7 and C4-C5. There is an enhancing focus within the spinal cord adjacent to C5-C6 and she has another hyperintense focus at C3-C4. On the second MRI, done without contrast, she is status post C3-C7 ACDF.      REVIEW OF SYSTEMS: Constitutional: No fevers, chills, sweats, or change in appetite Eyes: as above Ear, nose and throat: No hearing loss, ear pain, nasal congestion, sore throat Cardiovascular: No chest pain, palpitations Respiratory: No shortness of breath at rest or with exertion.   No wheezes GastrointestinaI: No nausea, vomiting, diarrhea, abdominal pain, fecal incontinence Genitourinary: see above Musculoskeletal: Some neck pain, back pain Integumentary: No rash, pruritus, skin lesions Neurological: as above Psychiatric: Notes depression and anxiety Endocrine: No palpitations, diaphoresis, change in appetite, change in weigh or increased  thirst Hematologic/Lymphatic: No anemia, purpura, petechiae. Allergic/Immunologic: No itchy/runny eyes, nasal congestion, recent allergic reactions, rashes  ALLERGIES: Allergies  Allergen Reactions  . Codeine     Makes her "busy", itchy  . Lamictal [Lamotrigine] Other (See Comments)    Mood changes   . Meperidine Nausea And Vomiting  . Meperidine Hcl Nausea And Vomiting    SEVERE N/V  . Morphine Other (See Comments)    "SKIN CRAWLS"    HOME MEDICATIONS:  Current Outpatient Prescriptions:  .  albuterol (PROVENTIL HFA;VENTOLIN HFA) 108 (90 Base) MCG/ACT inhaler, Inhale 2 puffs into the lungs every 6 (six) hours as needed for wheezing or shortness of breath., Disp: 1 Inhaler, Rfl: 0 .  amitriptyline (ELAVIL) 25 MG tablet, Take 1-2 tablets (25-50 mg total) by mouth at bedtime., Disp: 180  tablet, Rfl: 3 .  anastrozole (ARIMIDEX) 1 MG tablet, Take 1 mg by mouth daily., Disp: , Rfl:  .  baclofen (LIORESAL) 10 MG tablet, Take one pill during the day and two days at night, Disp: 90 tablet, Rfl: 11 .  diazepam (VALIUM) 5 MG tablet, Take 1 tablet (5 mg total) by mouth every 8 (eight) hours as needed. Take one tablet (5mg ) by mouth every 8 hours as needed for muscle spasms., Disp: 90 tablet, Rfl: 3 .  Dimethyl Fumarate (TECFIDERA) 240 MG CPDR, Take 1 capsule (240 mg total) by mouth 2 (two) times daily., Disp: 180 capsule, Rfl: 3 .  gabapentin (NEURONTIN) 300 MG capsule, TAKE 1 CAPSULE (300 MG TOTAL) BY MOUTH 3 (THREE) TIMES DAILY., Disp: 90 capsule, Rfl: 11 .  letrozole (FEMARA) 2.5 MG tablet, Take 1 tablet (2.5 mg total) by mouth daily., Disp: 90 tablet, Rfl: 3 .  Meth-Hyo-M Bl-Na Phos-Ph Sal (URIBEL) 118 MG CAPS, Take 1 capsule by mouth 3 (three) times daily as needed (PAIN/UTI). , Disp: , Rfl:  .  methylphenidate (RITALIN) 20 MG tablet, Take 1 tablet (20 mg total) by mouth 2 (two) times daily with breakfast and lunch., Disp: 60 tablet, Rfl: 0 .  Na Sulfate-K Sulfate-Mg Sulf 17.5-3.13-1.6  GM/180ML SOLN, Suprep-Use as directed, Disp: 354 mL, Rfl: 0 .  naproxen (NAPROSYN) 500 MG tablet, Take 1 tablet (500 mg total) by mouth 2 (two) times daily with a meal., Disp: 60 tablet, Rfl: 5 .  OnabotulinumtoxinA (BOTOX IM), Inject into the muscle. Facial to treat MS, Disp: , Rfl:  .  pantoprazole (PROTONIX) 40 MG tablet, Take 1 tablet (40 mg total) by mouth daily., Disp: 30 tablet, Rfl: 1 .  SUMAtriptan (IMITREX) 100 MG tablet, Take 1 tablet (100 mg total) by mouth every 2 (two) hours as needed for migraine. May repeat in 2 hours if headache persists or recurs., Disp: 10 tablet, Rfl: 6 .  valACYclovir (VALTREX) 1000 MG tablet, TAKE TWO TABLETS BY MOUTH EVERY 12 HOURS FOR  2  DOSES  AS  NEEDED  FOR  COLD  SORES, Disp: 30 tablet, Rfl: 5 .  venlafaxine XR (EFFEXOR-XR) 75 MG 24 hr capsule, Take 1 capsule (75 mg total) by mouth daily with breakfast., Disp: 30 capsule, Rfl: 5 .  Wound Dressings (SONAFINE), Apply 1 application topically 3 (three) times daily., Disp: , Rfl:   PAST MEDICAL HISTORY: Past Medical History:  Diagnosis Date  . Anal fissure   . Anxiety   . Arthritis   . Borderline diabetes   . Cancer [facility])    left breast cancer  . Depression   . Frequency of urination   . H/O cold sores   . History of pneumothorax    1988-- SPONTANEOUS--  RESOLVED W/ CHEST TUBE  . History of radiation therapy 07/29/16- 08/27/16   Left Breast 40.05 Gy in 15 fractions, Left Breast Boost 10 Gy in 5 fractions.   . IC (interstitial cystitis)   . Lesion of bladder   . Migraines   . MS (multiple sclerosis) ([place])    MRI showed plague on her brain  . Nocturia   . Preeclampsia 1994  . RSD (reflex sympathetic dystrophy)   . Seasonal allergies   . Tubular adenoma of colon   . Urgency of urination     PAST SURGICAL HISTORY: Past Surgical History:  Procedure Laterality Date  . ANTERIOR CERVICAL DECOMPRESSION/DISCECTOMY FUSION 4 LEVELS N/A 07/03/2014   Procedure: ANTERIOR CERVICAL  DECOMPRESSION/DISCECTOMY FUSION 4 LEVELS;  Surgeon: Newman Pies, MD;  Location: [facility] NEURO ORS;  Service: Neurosurgery;  Laterality: N/A;  C3-4 C4-5 C5-6 C6-7 Anterior cervical decompression/diskectomy/fusion/interbody prosthesis/plate  . APPENDECTOMY    . CESAREAN SECTION  1994  . CYSTOSCOPY WITH HYDRODISTENSION AND BIOPSY Bilateral 02/15/2014   Procedure: CYSTOSCOPY  BILATERAL RETROGRADE PYLOGRAM, HYDRODISTENSION, INSTILATION OF MARCAINE AND PYRIDIUM;  Surgeon: Ardis Hughs, MD;  Location: [facility];  Service: Urology;  Laterality: Bilateral;  . D & C HYSTEROSCOPY WITH POLYPECTOMY  12-02-2003  . DILATION AND CURETTAGE OF UTERUS    . DX LAPAROSCOPY/  FULGERATION ENDOMETRIOSIS/  APPENDECTOMY  1985  . RADIOACTIVE SEED GUIDED MASTECTOMY WITH AXILLARY SENTINEL LYMPH NODE BIOPSY Left 06/07/2016   Procedure: BREAST LUMPECTOMY WITH RADIOACTIVE SEED AND SENTINEL LYMPH NODE BIOPSY, INJECT BLUE DYE LEFT BREAST;  Surgeon: Fanny Skates, MD;  Location: [facility];  Service: General;  Laterality: Left;  . TONSILLECTOMY AND ADENOIDECTOMY  1972    FAMILY HISTORY: Family History  Problem Relation Age of Onset  . Hypertension Mother   . Diabetes Father   . Prostate cancer Father        dx late 32s  . Lung cancer Father 67       smoker  . Cancer Father        cheek of mouth, dx. between 80 and 58  . Diabetes Paternal Grandmother   . Diabetes Paternal Grandfather   . Aneurysm Maternal Grandmother 76       d. brain aneurysm  . Colon cancer Maternal Grandfather 92  . Diabetes Paternal Uncle        x 4  . Diabetes Paternal Aunt   . Colon cancer Maternal Uncle   . Cancer Maternal Uncle        mouth cancer; +tobacco  . Breast cancer Sister 65       s/p mastectomy  . Breast cancer Maternal Aunt        dx 63s  . Lymphoma Maternal Aunt 83  . Uterine cancer Maternal Aunt        d. late 73s  . Throat cancer Cousin        maternal 1st cousin; lim info  . Cancer  Paternal Uncle        NOS cancer  . Breast cancer Cousin        paternal 1st cousin dx 53s  . Esophageal cancer Neg Hx   . Rectal cancer Neg Hx   . Stomach cancer Neg Hx     SOCIAL HISTORY:  Social History   Social History  . Marital status: Single    Spouse name: N/A  . Number of children: 1  . Years of education: N/A   Occupational History  . self-employed house cleaning    Social History Main Topics  . Smoking status: Former Smoker    Packs/day: 0.25    Years: 27.00    Types: Cigarettes    Quit date: 01/17/2011  . Smokeless tobacco: Never Used  . Alcohol use No  . Drug use: No  . Sexual activity: No   Other Topics Concern  . Not on file   Social History Narrative  . No narrative on file     PHYSICAL EXAM  Vitals:   02/23/17 1338  BP: 108/70  Pulse: 80  Resp: 16  Weight: 119 lb (54 kg)  Height: 5' 9.75" (1.772 m)    Body mass index is 17.2 kg/m.   General: The patient is a thin woman  in no acute distress   Neurologic Exam  Mental status: The patient is alert and oriented x 3 at the time of the examination. The patient has apparent normal recent and remote memory, with an apparently normal attention span and concentration ability.   Speech is normal.  Cranial nerves:  Currently, she has very minimal ptosis on the right. I did not see any hemifacial spasms or blepharospasm during the visit today.  Patient strength and sensation is normal.  Trapezius and sternocleidomastoid strength is normal. No dysarthria is noted.  The tongue is midline, and the patient has symmetric elevation of the soft palate. No obvious hearing deficits are noted.  Motor:  Muscle bulk is normal.   Tone is normal. Strength is mildly reduced in the right triceps and intrinsic right hand muscles. Strength is normal in the legs.     Sensory: She has decreased sensation in the right C6 and C7 dermatomes.   ' Coordination: Cerebellar testing reveals symmetric  finger-nose-finger.  Gait and station: Station is normal.   Gait is normal. Tandem gait is wide. Romberg is negative.   Reflexes: Deep tendon reflexes are increased bilaterally with spread at the knees but no clonus.       DIAGNOSTIC DATA (LABS, IMAGING, TESTING) - I reviewed patient records, labs, notes, testing and imaging myself where available.  Lab Results  Component Value Date   WBC 5.0 07/08/2016   HGB 13.1 07/08/2016   HCT 38.3 07/08/2016   MCV 91 07/08/2016   PLT 183 07/08/2016      Component Value Date/Time   NA 141 06/03/2016 1500   K 4.1 06/03/2016 1500   CL 105 06/03/2016 1500   CO2 30 06/03/2016 1500   GLUCOSE 94 06/03/2016 1500   BUN 12 06/03/2016 1500   CREATININE 0.73 06/03/2016 1500   CREATININE 0.55 12/26/2014 1042   CALCIUM 9.6 06/03/2016 1500   PROT 7.1 06/03/2016 1500   ALBUMIN 4.1 06/03/2016 1500   AST 21 06/03/2016 1500   ALT 16 06/03/2016 1500   ALKPHOS 75 06/03/2016 1500   BILITOT 0.7 06/03/2016 1500   GFRNONAA >60 06/03/2016 1500   GFRNONAA >89 12/26/2014 1042   GFRAA >60 06/03/2016 1500   GFRAA >89 12/26/2014 1042     __________________________________________________  BOTOX INJECTION  The risks and benefits of Botox injection brain to Mrs. Icard. We discussed the possibility of ptosis as the most common side effect with injection for blepharospasm and hemifacial spasm.   The following injections were performed using sterile technique.  Right frontalis (2.5 units 2) Left frontalis (2.5 units 2) Procerus and corrugators (2.5 units 3) Orbicularis oculi (2.5 units 2 on the right upper and 2.5 units 1 on the right lower and 2.5 units upper on the left and 2.5 units lower on the left) Lateral canthus (2.5 units on each side)                  Right zygomaticus (2.5 units) Buccinator (5 units right and 2.5 units left)    Occipitalis (2.5 U x 4)    55  Splenius capitis (7.5 units 2) C3 paraspinal (5 units 2) C7 paraspinal (5  units 2) Trapezius (5 units 2)    45   Total 100 Units    ASSESSMENT AND PLAN  Hemifacial spasm  Blepharospasm  Migraine without aura and without status migrainosus, not intractable  MS (multiple sclerosis) ([facility])  Other fatigue  Gait disturbance   1.   Botox in injections into  the bilateral face as detailed above (100 units) above for blepharospasm and hemifacial spasm and migraine 2.   Continue Tecfidera for MS  3.   Ritalin 20 mg 2 daily hypersomnia and MS fatigue 4.   Stay active.   Continue other medications 5.   She will return in 3 months or sooner if there are new or worsening neurologic symptoms.    Kelvin Burpee A. Felecia Shelling, MD, PhD 1/0/2111, 7:35 PM Certified in Neurology, Clinical Neurophysiology, Sleep Medicine, Pain Medicine and Neuroimaging  [facility] [address] [place], [place] [address] [phone]

## 2017-02-24 ENCOUNTER — Other Ambulatory Visit: Payer: Self-pay | Admitting: Physician Assistant

## 2017-02-24 DIAGNOSIS — J208 Acute bronchitis due to other specified organisms: Principal | ICD-10-CM

## 2017-02-24 DIAGNOSIS — B9689 Other specified bacterial agents as the cause of diseases classified elsewhere: Secondary | ICD-10-CM

## 2017-03-02 DIAGNOSIS — M79672 Pain in left foot: Secondary | ICD-10-CM | POA: Diagnosis not present

## 2017-03-02 DIAGNOSIS — M7701 Medial epicondylitis, right elbow: Secondary | ICD-10-CM | POA: Diagnosis not present

## 2017-03-02 DIAGNOSIS — M25521 Pain in right elbow: Secondary | ICD-10-CM | POA: Diagnosis not present

## 2017-03-03 ENCOUNTER — Encounter: Payer: BLUE CROSS/BLUE SHIELD | Admitting: Internal Medicine

## 2017-03-03 DIAGNOSIS — T1512XA Foreign body in conjunctival sac, left eye, initial encounter: Secondary | ICD-10-CM | POA: Diagnosis not present

## 2017-03-12 ENCOUNTER — Other Ambulatory Visit: Payer: Self-pay | Admitting: Neurology

## 2017-03-15 ENCOUNTER — Encounter: Payer: Self-pay | Admitting: Internal Medicine

## 2017-03-15 ENCOUNTER — Ambulatory Visit (AMBULATORY_SURGERY_CENTER): Payer: Medicare Other | Admitting: Internal Medicine

## 2017-03-15 VITALS — BP 142/71 | HR 76 | Temp 98.0°F | Resp 20 | Ht 69.75 in | Wt 120.0 lb

## 2017-03-15 DIAGNOSIS — Z86010 Personal history of colonic polyps: Secondary | ICD-10-CM

## 2017-03-15 DIAGNOSIS — R1310 Dysphagia, unspecified: Secondary | ICD-10-CM | POA: Diagnosis not present

## 2017-03-15 DIAGNOSIS — K635 Polyp of colon: Secondary | ICD-10-CM | POA: Diagnosis not present

## 2017-03-15 DIAGNOSIS — D123 Benign neoplasm of transverse colon: Secondary | ICD-10-CM | POA: Diagnosis not present

## 2017-03-15 DIAGNOSIS — D125 Benign neoplasm of sigmoid colon: Secondary | ICD-10-CM

## 2017-03-15 DIAGNOSIS — Z1211 Encounter for screening for malignant neoplasm of colon: Secondary | ICD-10-CM | POA: Diagnosis not present

## 2017-03-15 DIAGNOSIS — R1319 Other dysphagia: Secondary | ICD-10-CM

## 2017-03-15 MED ORDER — SODIUM CHLORIDE 0.9 % IV SOLN: 500.0000 mL | INTRAVENOUS | Status: DC

## 2017-03-15 MED ORDER — HYDROCORTISONE ACE-PRAMOXINE 1-1 % RE CREA: 1.0000 "application " | TOPICAL_CREAM | Freq: Two times a day (BID) | RECTAL | 1 refills | Status: DC | PRN

## 2017-03-15 NOTE — Progress Notes (Signed)
Per pt if we touch pt on her Bilateral arms, hands or finger we need to touch her firmly d/t reflex sympathetic dystrophy.  I hang a sign on pt's IV pole.  I also relayed this message to Randall Hiss, RN .maw

## 2017-03-15 NOTE — Op Note (Signed)
[facility] Patient Name: Stacy Logan Procedure Date: 03/15/2017 2:33 PM MRN: [id number] Endoscopist: Jerene Bears , MD Age: 51 Referring MD:  Date of Birth: 12-17-1965 Gender: Female Account #: [id number] Procedure:                Colonoscopy Indications:              High risk colon cancer surveillance: Personal                            history of adenoma with villous component, Last                            colonoscopy 3 years ago Medicines:                Monitored Anesthesia Care Procedure:                Pre-Anesthesia Assessment:                           - Prior to the procedure, a History and Physical                            was performed, and patient medications and                            allergies were reviewed. The patient's tolerance of                            previous anesthesia was also reviewed. The risks                            and benefits of the procedure and the sedation                            options and risks were discussed with the patient.                            All questions were answered, and informed consent                            was obtained. Prior Anticoagulants: The patient has                            taken no previous anticoagulant or antiplatelet                            agents. ASA Grade Assessment: II - A patient with                            mild systemic disease. After reviewing the risks                            and benefits, the patient was deemed in  satisfactory condition to undergo the procedure.                           After obtaining informed consent, the colonoscope                            was passed under direct vision. Throughout the                            procedure, the patient's blood pressure, pulse, and                            oxygen saturations were monitored continuously. The                            Colonoscope was introduced through the anus and                             advanced to the the cecum, identified by                            appendiceal orifice and ileocecal valve. The                            colonoscopy was technically difficult and complex                            due to restricted mobility of the left colon.                            Successful completion of the procedure was aided by                            changing the patient to a supine position, applying                            abdominal pressure and lavage/water immersion                            (200-300 cc saline). The patient tolerated the                            procedure well. The quality of the bowel                            preparation was good. The ileocecal valve,                            appendiceal orifice, and rectum were photographed. Scope In: 2:51:52 PM Scope Out: 3:22:15 PM Scope Withdrawal Time: 0 hours 17 minutes 13 seconds  Total Procedure Duration: 0 hours 30 minutes 23 seconds  Findings:                 The perianal exam findings include hemorrhoids.  A 6 mm polyp was found in the proximal transverse                            colon. The polyp was sessile. The polyp was removed                            with a cold snare. Resection and retrieval were                            complete.                           A 6 mm polyp was found in the sigmoid colon. The                            polyp was sessile. The polyp was removed with a                            cold snare. Resection and retrieval were complete.                           Multiple small-mouthed diverticula were found in                            the sigmoid colon.                           External and internal hemorrhoids were found during                            retroflexion and during digital exam. The                            hemorrhoids were medium-sized. Complications:            No immediate  complications. Estimated Blood Loss:     Estimated blood loss was minimal. Impression:               - One 6 mm polyp in the proximal transverse colon,                            removed with a cold snare. Resected and retrieved.                           - One 6 mm polyp in the sigmoid colon, removed with                            a cold snare. Resected and retrieved.                           - Moderate diverticulosis in the sigmoid colon.                           - External and internal hemorrhoids. Recommendation:           -  Patient has a contact number available for                            emergencies. The signs and symptoms of potential                            delayed complications were discussed with the                            patient. Return to normal activities tomorrow.                            Written discharge instructions were provided to the                            patient.                           - Resume previous diet.                           - Continue present medications.                           - Await pathology results.                           - Repeat colonoscopy is recommended for                            surveillance. The colonoscopy date will be                            determined after pathology results from today's                            exam become available for review. Jerene Bears, MD 03/15/2017 3:34:36 PM This report has been signed electronically.

## 2017-03-15 NOTE — Progress Notes (Signed)
Called to room to assist during endoscopic procedure.  Patient ID and intended procedure confirmed with present staff. Received instructions for my participation in the procedure from the performing physician.  

## 2017-03-15 NOTE — Progress Notes (Signed)
Dental advisory given to patientAlert and oriented x3, pleased with MAC, report to RN Sarah 

## 2017-03-15 NOTE — Progress Notes (Signed)
Pt's states no medical or surgical changes since previsit or office visit. maw 

## 2017-03-15 NOTE — Op Note (Signed)
[facility] Patient Name: Stacy Logan Procedure Date: 03/15/2017 2:19 PM MRN: [id number] Endoscopist: Jerene Bears , MD Age: 51 Referring MD:  Date of Birth: 10-Dec-1965 Gender: Female Account #: [id number] Procedure:                Upper GI endoscopy Indications:              Dysphagia Medicines:                Monitored Anesthesia Care Procedure:                Pre-Anesthesia Assessment:                           - Prior to the procedure, a History and Physical                            was performed, and patient medications and                            allergies were reviewed. The patient's tolerance of                            previous anesthesia was also reviewed. The risks                            and benefits of the procedure and the sedation                            options and risks were discussed with the patient.                            All questions were answered, and informed consent                            was obtained. Prior Anticoagulants: The patient has                            taken no previous anticoagulant or antiplatelet                            agents. ASA Grade Assessment: II - A patient with                            mild systemic disease. After reviewing the risks                            and benefits, the patient was deemed in                            satisfactory condition to undergo the procedure.                           After obtaining informed consent, the endoscope was  passed under direct vision. Throughout the                            procedure, the patient's blood pressure, pulse, and                            oxygen saturations were monitored continuously. The                            Endoscope was introduced through the mouth, and                            advanced to the second part of duodenum. The upper                            GI endoscopy was accomplished without difficulty.                             The patient tolerated the procedure well. Scope In: Scope Out: Findings:                 The esophagus was normal.                           The stomach was normal.                           The examined duodenum was normal. Complications:            No immediate complications. Estimated Blood Loss:     Estimated blood loss: none. Impression:               - Normal esophagus.                           - Normal stomach.                           - Normal examined duodenum.                           - No specimens collected. Recommendation:           - Patient has a contact number available for                            emergencies. The signs and symptoms of potential                            delayed complications were discussed with the                            patient. Return to normal activities tomorrow.                            Written discharge instructions were provided to the  patient.                           - Resume previous diet.                           - Continue present medications. Jerene Bears, MD 03/15/2017 3:28:47 PM This report has been signed electronically.

## 2017-03-15 NOTE — Patient Instructions (Signed)
YOU HAD AN ENDOSCOPIC PROCEDURE TODAY AT [facility]:   Refer to the procedure report that was given to you for any specific questions about what was found during the examination.  If the procedure report does not answer your questions, please call your gastroenterologist to clarify.  If you requested that your care partner not be given the details of your procedure findings, then the procedure report has been included in a sealed envelope for you to review at your convenience later.  YOU SHOULD EXPECT: Some feelings of bloating in the abdomen. Passage of more gas than usual.  Walking can help get rid of the air that was put into your GI tract during the procedure and reduce the bloating. If you had a lower endoscopy (such as a colonoscopy or flexible sigmoidoscopy) you may notice spotting of blood in your stool or on the toilet paper. If you underwent a bowel prep for your procedure, you may not have a normal bowel movement for a few days.  Please Note:  You might notice some irritation and congestion in your nose or some drainage.  This is from the oxygen used during your procedure.  There is no need for concern and it should clear up in a day or so.  SYMPTOMS TO REPORT IMMEDIATELY:   Following lower endoscopy (colonoscopy or flexible sigmoidoscopy):  Excessive amounts of blood in the stool  Significant tenderness or worsening of abdominal pains  Swelling of the abdomen that is new, acute  Fever of 100F or higher   Following upper endoscopy (EGD)  Vomiting of blood or coffee ground material  New chest pain or pain under the shoulder blades  Painful or persistently difficult swallowing  New shortness of breath  Fever of 100F or higher  Black, tarry-looking stools  For urgent or emergent issues, a gastroenterologist can be reached at any hour by calling [phone].   DIET:  We do recommend a small meal at first, but then you may proceed to your regular diet.  Drink  plenty of fluids but you should avoid alcoholic beverages for 24 hours.  ACTIVITY:  You should plan to take it easy for the rest of today and you should NOT DRIVE or use heavy machinery until tomorrow (because of the sedation medicines used during the test).    FOLLOW UP: Our staff will call the number listed on your records the next business day following your procedure to check on you and address any questions or concerns that you may have regarding the information given to you following your procedure. If we do not reach you, we will leave a message.  However, if you are feeling well and you are not experiencing any problems, there is no need to return our call.  We will assume that you have returned to your regular daily activities without incident.  If any biopsies were taken you will be contacted by phone or by letter within the next 1-3 weeks.  Please call us at [phone] if you have not heard about the biopsies in 3 weeks.   Await for biopsy results to determine next repeat Colonoscopy screening Polyps (handout given) Diverticulosis (handout given) Hemorrhoids (handout given) Diverticulosis (handout given) Hemorrhoids Banding (handout given)   SIGNATURES/CONFIDENTIALITY: You and/or your care partner have signed paperwork which will be entered into your electronic medical record.  These signatures attest to the fact that that the information above on your After Visit Summary has been reviewed and is understood.  Full responsibility of the confidentiality of this discharge information lies with you and/or your care-partner.

## 2017-03-16 ENCOUNTER — Telehealth: Payer: Self-pay | Admitting: *Deleted

## 2017-03-16 NOTE — Telephone Encounter (Signed)
  Follow up Call-  Call back number 03/15/2017  Post procedure Call Back phone  # [phone] cell  Permission to leave phone message No  Some recent data might be hidden     Patient questions:  Do you have a fever, pain , or abdominal swelling? No. Pain Score  0 *  Have you tolerated food without any problems? Yes.    Have you been able to return to your normal activities? Yes.    Do you have any questions about your discharge instructions: Diet   No. Medications  No. Follow up visit  No.  Do you have questions or concerns about your Care? No.  Actions: * If pain score is 4 or above: No action needed, pain <4.

## 2017-03-17 ENCOUNTER — Encounter: Payer: Self-pay | Admitting: Adult Health

## 2017-03-17 ENCOUNTER — Ambulatory Visit (HOSPITAL_BASED_OUTPATIENT_CLINIC_OR_DEPARTMENT_OTHER): Payer: Medicare Other | Admitting: Adult Health

## 2017-03-17 VITALS — BP 123/86 | HR 76 | Temp 99.0°F | Resp 18 | Ht 69.75 in | Wt 115.1 lb

## 2017-03-17 DIAGNOSIS — C50412 Malignant neoplasm of upper-outer quadrant of left female breast: Secondary | ICD-10-CM | POA: Diagnosis not present

## 2017-03-17 DIAGNOSIS — E2839 Other primary ovarian failure: Secondary | ICD-10-CM

## 2017-03-17 DIAGNOSIS — Z170 Estrogen receptor positive status [ER+]: Secondary | ICD-10-CM | POA: Diagnosis not present

## 2017-03-17 NOTE — Progress Notes (Signed)
CLINIC:  Survivorship   REASON FOR VISIT:  Routine follow-up post-treatment for a recent history of breast cancer.  BRIEF ONCOLOGIC HISTORY:    Breast cancer of upper-outer quadrant of left female breast ([place]) (Resolved)   05/10/2016 Initial Diagnosis    Left breast biopsy UOQ: IDC with DCIS, grade 1-2, ER 100%, PR 10%, Ki-67 10%, HER-2 negative ratio 1.59      05/10/2016 Mammogram    Left breast: 1.2 cm irregular marginated mass 1:00 position 6 cm from the nipple. Numerous grouped calcifications throughout right breast: Stable compared to prior, Left breast: calcifications stable, T1 cN0 stage IA clinical stage      05/11/2016 Procedure    Genetic testing:negative for mutations within any of 33 genes on the Custom Panel (Breast, Gyn, GI genes plus FLCN due to her personal history of pneumothorax) through [facility]      06/07/2016 Surgery    Left lumpectomy: IDC grade 1, 1.1 cm, DCIS intermediate grade, margins negative, 0/2 lymph nodes negative, T1c N0 stage IA       Malignant neoplasm of upper-outer quadrant of left female breast ([place])   05/10/2016 Mammogram    Left breast: 1.2 cm irregular marginated mass 1:00 position 6 cm from the nipple. Numerous grouped calcifications throughout right breast: Stable compared to prior, Left breast: calcifications stable, T1 cN0 stage IA clinical stage      05/10/2016 Initial Diagnosis    Left breast biopsy UOQ: IDC with DCIS, grade 1-2, ER 100%, PR 10%, Ki-67 10%, HER-2 negative ratio 1.59      05/11/2016 Miscellaneous    Genetic testing:negative for mutations within any of 33 genes on the Custom Panel (Breast, Gyn, GI genes plus FLCN due to her personal history of pneumothorax) through [facility]      05/27/2016 Genetic Testing    Genetic testing was normal, and did not reveal a deleterious mutation in these genes.  Additionally, no variants of uncertain significance (VUSes) were found. Genes tested include: APC, ATM,  AXIN2, BARD1, BMPR1A, BRCA1, BRCA2, BRIP1, CDH1, CDK4, CDKN2A, CHEK2, EPCAM, FANCC, FLCN, MLH1, MSH2, MSH6, MUTYH, NBN, PALB2, PMS2, POLD1, POLE, PTEN, RAD51C, RAD51D, SCG5/GREM1, SMAD4, STK11, TP53, VHL, and XRCC2        06/07/2016 Surgery    Left lumpectomy: IDC grade 1, 1.1 cm, DCIS intermediate grade, margins negative, 0/2 lymph nodes negative, T1c N0 stage IA      06/19/2016 Oncotype testing    Oncotype DX score 22, intermediate risk, 14% ROR      07/29/2016 - 08/27/2016 Radiation Therapy    Adjuvant radiation therapy (squire): 1. The Left breast was treated to 40.05 Gy in 15 fractions at 2.67 Gy per fraction. 2. The Left breast was boosted to 10 Gy in 5 fractions at 2 Gy per fraction.       09/20/2016 -  Anti-estrogen oral therapy    Anastrozole 1 mg daily       INTERVAL HISTORY:  Stacy Logan presents to the [facility] today for our initial meeting to review her survivorship care plan detailing her treatment course for breast cancer, as well as monitoring long-term side effects of that treatment, education regarding health maintenance, screening, and overall wellness and health promotion.     Overall, Stacy Logan reports feeling quite well.  She is taking anastrozole daily and tolerating it well.  She denies any issues with it, other than hot flashes.  She is already taking Gabapentin and Effexor.      REVIEW OF  SYSTEMS:  Review of Systems  Constitutional: Negative for appetite change, chills, fatigue, fever and unexpected weight change.  HENT:   Negative for hearing loss and lump/mass.   Eyes: Negative for eye problems and icterus.  Respiratory: Negative for chest tightness, cough and shortness of breath.   Cardiovascular: Negative for chest pain, leg swelling and palpitations.  Gastrointestinal: Negative for abdominal distention, abdominal pain, constipation, diarrhea, nausea and vomiting.  Endocrine: Positive for hot flashes.  Genitourinary: Negative for difficulty  urinating, dyspareunia and pelvic pain.   Musculoskeletal: Negative for arthralgias.  Skin: Negative for itching and rash.  Neurological: Negative for dizziness, extremity weakness, headaches and numbness.  Hematological: Negative for adenopathy. Does not bruise/bleed easily.  Psychiatric/Behavioral: Positive for depression. The patient is not nervous/anxious.   Breast: Denies any new nodularity, masses, tenderness, nipple changes, or nipple discharge.      ONCOLOGY TREATMENT TEAM:  1. Surgeon:  Dr. Derrell Lolling at [facility] 2. Medical Oncologist: Dr. Pamelia Hoit  3. Radiation Oncologist: Dr. Basilio Cairo    PAST MEDICAL/SURGICAL HISTORY:  Past Medical History:  Diagnosis Date  . Anal fissure   . Anxiety   . Arthritis   . Borderline diabetes   . Cancer [facility])    left breast cancer  . Depression   . Frequency of urination   . H/O cold sores   . History of pneumothorax    1988-- SPONTANEOUS--  RESOLVED W/ CHEST TUBE  . History of radiation therapy 07/29/16- 08/27/16   Left Breast 40.05 Gy in 15 fractions, Left Breast Boost 10 Gy in 5 fractions.   . IC (interstitial cystitis)   . Lesion of bladder   . Migraines   . MS (multiple sclerosis) (HCC)    MRI showed plague on her brain  . Nocturia   . Preeclampsia 1994  . RSD (reflex sympathetic dystrophy)   . Seasonal allergies   . Tubular adenoma of colon   . Urgency of urination    Past Surgical History:  Procedure Laterality Date  . ANTERIOR CERVICAL DECOMPRESSION/DISCECTOMY FUSION 4 LEVELS N/A 07/03/2014   Procedure: ANTERIOR CERVICAL DECOMPRESSION/DISCECTOMY FUSION 4 LEVELS;  Surgeon: Tressie Stalker, MD;  Location: [facility];  Service: Neurosurgery;  Laterality: N/A;  C3-4 C4-5 C5-6 C6-7 Anterior cervical decompression/diskectomy/fusion/interbody prosthesis/plate  . APPENDECTOMY    . CESAREAN SECTION  1994  . CYSTOSCOPY WITH HYDRODISTENSION AND BIOPSY Bilateral 02/15/2014   Procedure: CYSTOSCOPY  BILATERAL RETROGRADE  PYLOGRAM, HYDRODISTENSION, INSTILATION OF MARCAINE AND PYRIDIUM;  Surgeon: Crist Fat, MD;  Location: [facility];  Service: Urology;  Laterality: Bilateral;  . D & C HYSTEROSCOPY WITH POLYPECTOMY  12-02-2003  . DILATION AND CURETTAGE OF UTERUS    . DX LAPAROSCOPY/  FULGERATION ENDOMETRIOSIS/  APPENDECTOMY  1985  . RADIOACTIVE SEED GUIDED MASTECTOMY WITH AXILLARY SENTINEL LYMPH NODE BIOPSY Left 06/07/2016   Procedure: BREAST LUMPECTOMY WITH RADIOACTIVE SEED AND SENTINEL LYMPH NODE BIOPSY, INJECT BLUE DYE LEFT BREAST;  Surgeon: Claud Kelp, MD;  Location: [facility];  Service: General;  Laterality: Left;  . TONSILLECTOMY AND ADENOIDECTOMY  1972     ALLERGIES:  Allergies  Allergen Reactions  . Codeine     Makes her "busy", itchy  . Lamictal [Lamotrigine] Other (See Comments)    Mood changes   . Meperidine Nausea And Vomiting  . Meperidine Hcl Nausea And Vomiting    SEVERE N/V  . Morphine Other (See Comments)    "SKIN CRAWLS"     CURRENT MEDICATIONS:  Outpatient Encounter  Prescriptions as of 03/17/2017  Medication Sig  . amitriptyline (ELAVIL) 25 MG tablet Take 1-2 tablets (25-50 mg total) by mouth at bedtime.  Marland Kitchen anastrozole (ARIMIDEX) 1 MG tablet Take 1 mg by mouth daily.  . baclofen (LIORESAL) 10 MG tablet TAKE 1 TABLET BY MOUTH TWICE A DAY  . diazepam (VALIUM) 5 MG tablet Take 1 tablet (5 mg total) by mouth every 8 (eight) hours as needed. Take one tablet ('5mg'$ ) by mouth every 8 hours as needed for muscle spasms.  . Dimethyl Fumarate (TECFIDERA) 240 MG CPDR Take 1 capsule (240 mg total) by mouth 2 (two) times daily.  Marland Kitchen gabapentin (NEURONTIN) 300 MG capsule TAKE 1 CAPSULE (300 MG TOTAL) BY MOUTH 3 (THREE) TIMES DAILY.  Marland Kitchen letrozole (FEMARA) 2.5 MG tablet Take 1 tablet (2.5 mg total) by mouth daily.  . Meth-Hyo-M Bl-Na Phos-Ph Sal (URIBEL) 118 MG CAPS Take 1 capsule by mouth 3 (three) times daily as needed (PAIN/UTI).   Marland Kitchen methylphenidate  (RITALIN) 20 MG tablet Take 1 tablet (20 mg total) by mouth 2 (two) times daily with breakfast and lunch.  . naproxen (NAPROSYN) 500 MG tablet Take 1 tablet (500 mg total) by mouth 2 (two) times daily with a meal.  . OnabotulinumtoxinA (BOTOX IM) Inject into the muscle. Facial to treat MS  . pantoprazole (PROTONIX) 40 MG tablet Take 1 tablet (40 mg total) by mouth daily.  . pramoxine-hydrocortisone (PROCTOCREAM-HC) 1-1 % rectal cream Place 1 application rectally 2 (two) times daily as needed for hemorrhoids or itching.  . SUMAtriptan (IMITREX) 100 MG tablet TAKE 1 TAB BY MOUTH EVERY 2 HRS AS NEEDED FOR MIGRAINE. MEAY REPEAT IN 2 HRS IF HEADACHE PERSISTS  . valACYclovir (VALTREX) 1000 MG tablet TAKE TWO TABLETS BY MOUTH EVERY 12 HOURS FOR  2  DOSES  AS  NEEDED  FOR  COLD  SORES  . venlafaxine XR (EFFEXOR-XR) 75 MG 24 hr capsule Take 1 capsule (75 mg total) by mouth daily with breakfast.   Facility-Administered Encounter Medications as of 03/17/2017  Medication  . 0.9 %  sodium chloride infusion     ONCOLOGIC FAMILY HISTORY:  Family History  Problem Relation Age of Onset  . Hypertension Mother   . Diabetes Father   . Prostate cancer Father        dx late 38s  . Lung cancer Father 106       smoker  . Cancer Father        cheek of mouth, dx. between 70 and 85  . Diabetes Paternal Grandmother   . Diabetes Paternal Grandfather   . Aneurysm Maternal Grandmother 76       d. brain aneurysm  . Prostate cancer Maternal Grandfather   . Diabetes Paternal Uncle        x 4  . Diabetes Paternal Aunt   . Colon cancer Maternal Uncle   . Cancer Maternal Uncle        mouth cancer; +tobacco  . Breast cancer Sister 8       s/p mastectomy  . Breast cancer Maternal Aunt        dx 66s  . Lymphoma Maternal Aunt 83  . Uterine cancer Maternal Aunt        d. late 49s  . Throat cancer Cousin        maternal 1st cousin; lim info  . Cancer Paternal Uncle        NOS cancer  . Breast cancer Cousin  paternal 1st cousin dx 70s  . Esophageal cancer Neg Hx   . Rectal cancer Neg Hx   . Stomach cancer Neg Hx      GENETIC COUNSELING/TESTING: See above  SOCIAL HISTORY:  JARIAH TARKOWSKI is single and lives alone in [place], [place].  She denies any current or history of tobacco, alcohol, or illicit drug use.     PHYSICAL EXAMINATION:  Vital Signs:   Vitals:   03/17/17 1155  BP: 123/86  Pulse: 76  Resp: 18  Temp: 99 F (37.2 C)   Filed Weights   03/17/17 1155  Weight: 115 lb 1.6 oz (52.2 kg)   General: Well-nourished, well-appearing female in no acute distress.  She is unaccompanied today.   HEENT: Head is normocephalic.  Pupils equal and reactive to light. Conjunctivae clear without exudate.  Sclerae anicteric. Oral mucosa is pink, moist.  Oropharynx is pink without lesions or erythema.  Lymph: No cervical, supraclavicular, or infraclavicular lymphadenopathy noted on palpation.  Cardiovascular: Regular rate and rhythm.Marland Kitchen Respiratory: Clear to auscultation bilaterally. Chest expansion symmetric; breathing non-labored.  GI: Abdomen soft and round; non-tender, non-distended. Bowel sounds normoactive.  GU: Deferred.  Neuro: No focal deficits. Steady gait.  Psych: Mood and affect normal and appropriate for situation.  Extremities: No edema. MSK: No focal spinal tenderness to palpation.  Full range of motion in bilateral upper extremities Skin: Warm and dry.  LABORATORY DATA:  None for this visit.  DIAGNOSTIC IMAGING:  None for this visit.      ASSESSMENT AND PLAN:  Ms.. Logan is a pleasant 51 y.o. female with Stage IA left breast invasive ductal carcinoma, ER+/PR+/HER2-, diagnosed in 04/2016, treated with lumpectomy, adjuvant radiation therapy, and anti-estrogen therapy with Anastrozole beginning in 09/2016.  She presents to the Survivorship Clinic for our initial meeting and routine follow-up post-completion of treatment for breast cancer.    1. Stage IA left  breast cancer:  Ms. Logan is continuing to recover from definitive treatment for breast cancer. She will follow-up with her medical oncologist, Dr. Pamelia Hoit in 06/2017 in  with history and physical exam per surveillance protocol.  She will continue her anti-estrogen therapy with Anastrozole. Thus far, she is tolerating the Anastrozole well, with minimal side effects. She was instructed to make Dr. Pamelia Hoit or myself aware if she begins to experience any worsening side effects of the medication and I could see her back in clinic to help manage those side effects, as needed. Today, a comprehensive survivorship care plan and treatment summary was reviewed with the patient today detailing her breast cancer diagnosis, treatment course, potential late/long-term effects of treatment, appropriate follow-up care with recommendations for the future, and patient education resources.  A copy of this summary, along with a letter will be sent to the patient's primary care provider via mail/fax/In Basket message after today's visit.    2. Bone health:  Given Stacy Logan age/history of breast cancer and her current treatment regimen including anti-estrogen therapy with Anastrozole, she is at risk for bone demineralization.  Her last DEXA scan was 03/2015, whichwe don't know the results.  I ordered a repeat bone density at the breast center for August, 2018.  In the meantime, she was encouraged to increase her consumption of foods rich in calcium, as well as increase her weight-bearing activities.  She was given education on specific activities to promote bone health.  3. Cancer screening:  Due to Stacy Logan history and her age, she should receive screening for skin cancers,  colon cancer, and gynecologic cancers.  The information and recommendations are listed on the patient's comprehensive care plan/treatment summary and were reviewed in detail with the patient.    4. Health maintenance and wellness promotion: Stacy Logan was  encouraged to consume 5-7 servings of fruits and vegetables per day. We reviewed the "Nutrition Rainbow" handout, as well as the handout "Take Control of Your Health and Reduce Your Cancer Risk" from the [facility].  She was also encouraged to engage in moderate to vigorous exercise for 30 minutes per day most days of the week. We discussed the [employer] fitness program, which is designed for cancer survivors to help them become more physically fit after cancer treatments.  She was instructed to limit her alcohol consumption and continue to abstain from tobacco use.     5. Support services/counseling: It is not uncommon for this period of the patient's cancer care trajectory to be one of many emotions and stressors.  We discussed an opportunity for her to participate in the next session of [facility] ("Finding Your New Normal") support group series designed for patients after they have completed treatment.   Stacy Logan was encouraged to take advantage of our many other support services programs, support groups, and/or counseling in coping with her new life as a cancer survivor after completing anti-cancer treatment.  She was offered support today through active listening and expressive supportive counseling.  She was given information regarding our available services and encouraged to contact me with any questions or for help enrolling in any of our support group/programs.    Dispo:   -Return to cancer center for follow up with Dr. Lindi Adie 06/2017 -Mammogram due in 04/2017 -Bone Density in 04/2017 -Follow up with surgery in 12/2017 -She is welcome to return back to the Survivorship Clinic at any time; no additional follow-up needed at this time.  -Consider referral back to survivorship as a long-term survivor for continued surveillance  A total of (30) minutes of face-to-face time was spent with this patient with greater than 50% of that time in counseling and care-coordination.   Gardenia Phlegm, [place] [phone]   Note: PRIMARY CARE PROVIDER Brunetta Jeans, [place] [phone]

## 2017-03-25 ENCOUNTER — Encounter: Payer: Self-pay | Admitting: Internal Medicine

## 2017-03-30 ENCOUNTER — Other Ambulatory Visit: Payer: Self-pay | Admitting: Neurology

## 2017-04-16 ENCOUNTER — Other Ambulatory Visit: Payer: Self-pay | Admitting: Physician Assistant

## 2017-04-26 ENCOUNTER — Encounter: Payer: Self-pay | Admitting: *Deleted

## 2017-05-10 ENCOUNTER — Emergency Department (HOSPITAL_COMMUNITY): Payer: Medicare Other

## 2017-05-10 ENCOUNTER — Emergency Department (HOSPITAL_COMMUNITY)
Admission: EM | Admit: 2017-05-10 | Discharge: 2017-05-10 | Disposition: A | Discharge status: Home / Self Care | Payer: Medicare Other | Attending: Emergency Medicine | Admitting: Emergency Medicine

## 2017-05-10 ENCOUNTER — Encounter (HOSPITAL_COMMUNITY): Payer: Self-pay | Admitting: Emergency Medicine

## 2017-05-10 DIAGNOSIS — Y929 Unspecified place or not applicable: Secondary | ICD-10-CM | POA: Insufficient documentation

## 2017-05-10 DIAGNOSIS — Z79899 Other long term (current) drug therapy: Secondary | ICD-10-CM | POA: Insufficient documentation

## 2017-05-10 DIAGNOSIS — Y939 Activity, unspecified: Secondary | ICD-10-CM | POA: Insufficient documentation

## 2017-05-10 DIAGNOSIS — S42202A Unspecified fracture of upper end of left humerus, initial encounter for closed fracture: Secondary | ICD-10-CM | POA: Diagnosis not present

## 2017-05-10 DIAGNOSIS — Y999 Unspecified external cause status: Secondary | ICD-10-CM | POA: Diagnosis not present

## 2017-05-10 DIAGNOSIS — M791 Myalgia: Secondary | ICD-10-CM | POA: Diagnosis not present

## 2017-05-10 DIAGNOSIS — G35 Multiple sclerosis: Secondary | ICD-10-CM | POA: Insufficient documentation

## 2017-05-10 DIAGNOSIS — C50412 Malignant neoplasm of upper-outer quadrant of left female breast: Secondary | ICD-10-CM | POA: Insufficient documentation

## 2017-05-10 DIAGNOSIS — S79922A Unspecified injury of left thigh, initial encounter: Secondary | ICD-10-CM | POA: Diagnosis not present

## 2017-05-10 DIAGNOSIS — S4992XA Unspecified injury of left shoulder and upper arm, initial encounter: Secondary | ICD-10-CM | POA: Diagnosis not present

## 2017-05-10 DIAGNOSIS — M79652 Pain in left thigh: Secondary | ICD-10-CM | POA: Diagnosis not present

## 2017-05-10 DIAGNOSIS — S42252A Displaced fracture of greater tuberosity of left humerus, initial encounter for closed fracture: Secondary | ICD-10-CM

## 2017-05-10 DIAGNOSIS — M25552 Pain in left hip: Secondary | ICD-10-CM | POA: Diagnosis not present

## 2017-05-10 DIAGNOSIS — Z87891 Personal history of nicotine dependence: Secondary | ICD-10-CM | POA: Insufficient documentation

## 2017-05-10 DIAGNOSIS — W19XXXA Unspecified fall, initial encounter: Secondary | ICD-10-CM

## 2017-05-10 DIAGNOSIS — M25512 Pain in left shoulder: Secondary | ICD-10-CM | POA: Diagnosis not present

## 2017-05-10 DIAGNOSIS — M79602 Pain in left arm: Secondary | ICD-10-CM | POA: Diagnosis not present

## 2017-05-10 DIAGNOSIS — S59912A Unspecified injury of left forearm, initial encounter: Secondary | ICD-10-CM | POA: Diagnosis not present

## 2017-05-10 MED ORDER — HYDROMORPHONE HCL 1 MG/ML IJ SOLN
1.0000 mg | Freq: Once | INTRAMUSCULAR | Status: AC
  Administered 2017-05-10: 1 mg via INTRAVENOUS
  Filled 2017-05-10: qty 1

## 2017-05-10 MED ORDER — OXYCODONE-ACETAMINOPHEN 5-325 MG PO TABS
1.0000 | ORAL_TABLET | Freq: Once | ORAL | Status: AC
  Administered 2017-05-10: 1 via ORAL

## 2017-05-10 MED ORDER — OXYCODONE-ACETAMINOPHEN 5-325 MG PO TABS: 1.0000 | ORAL_TABLET | Freq: Four times a day (QID) | ORAL | 0 refills | Status: DC | PRN

## 2017-05-10 MED ORDER — OXYCODONE-ACETAMINOPHEN 5-325 MG PO TABS
ORAL_TABLET | ORAL | Status: AC
  Administered 2017-05-10: 1 via ORAL
  Filled 2017-05-10: qty 1

## 2017-05-10 NOTE — ED Triage Notes (Signed)
Patient reports that she has MS and was walking when leg pop causing her to fall on left side. Patient in tears and c/o severe left shoulder pain and c/o also pain on entire left side of body.

## 2017-05-10 NOTE — ED Notes (Signed)
Pt. Ambulated with no problems and independently.

## 2017-05-10 NOTE — Discharge Instructions (Signed)
Please read instructions below. Apply ice to your shoulder and hip for 20 minutes at a time. You can take percocet every 6 hours as needed for  severe pain. Do not take Tylenol, drive, drink alcohol while taking this medication. You can take your baclofen as prescribed.  You can take advil, in addition to percocet, for pain. Schedule an appointment with your Orthopedic specialist in 1 week for follow-up on your injury. Return to the ER for new or concerning symptoms.

## 2017-05-10 NOTE — ED Provider Notes (Signed)
[facility] DEPT Provider Note   CSN: [id number] Arrival date & time: 05/10/17  1255     History   Chief Complaint Chief Complaint  Patient presents with  . Fall  . Shoulder Pain  . left side pain    HPI Stacy Logan is a 51 y.o. female w PMHx MS, reflex sympathetic dystrophy, breast cancer, anxiety, arthritis, presenting s/p mechanical fall onto left side, with left shoulder pain. Pt states her left leg gave out, as it does 2/t to MS, causing her to fall onto a carpeted floor onto her left side with left arm outstretched catching her fall. She denies head trauma or LOC. She states left shoulder pain is 10/10, worse with moving her left arm. Also localizes pain to left bicep and left forearm, as well as left groin. States groin pain is made worse with movement of her leg, and improved with lying still. She denies new numbness or tingling down extremities, neck or back pain, headache, vision changes, any other complaints today.   The history is provided by the patient and a relative.    Past Medical History:  Diagnosis Date  . Anal fissure   . Anxiety   . Arthritis   . Borderline diabetes   . Cancer [facility])    left breast cancer  . Depression   . Frequency of urination   . H/O cold sores   . History of pneumothorax    1988-- SPONTANEOUS--  RESOLVED W/ CHEST TUBE  . History of radiation therapy 07/29/16- 08/27/16   Left Breast 40.05 Gy in 15 fractions, Left Breast Boost 10 Gy in 5 fractions.   . IC (interstitial cystitis)   . Internal hemorrhoids   . Lesion of bladder   . Migraines   . MS (multiple sclerosis) ([place])    MRI showed plague on her brain  . Nocturia   . Preeclampsia 1994  . RSD (reflex sympathetic dystrophy)   . Seasonal allergies   . Tubular adenoma of colon   . Urgency of urination     Patient Active Problem List   Diagnosis Date Noted  . Gait disturbance 02/23/2017  . Displaced fracture of fifth metatarsal bone, left foot, initial encounter for open  fracture 02/23/2017  . Elbow pain, chronic, right 02/23/2017  . History of foot fracture 02/23/2017  . Malignant neoplasm of upper-outer quadrant of left female breast ([place]) 07/21/2016  . Genetic testing 06/27/2016  . Blepharospasm 05/19/2016  . Hemifacial spasm 05/19/2016  . Numbness 11/04/2015  . Ulnar neuropathy 11/04/2015  . Physical exam 05/15/2015  . Dysesthesia 04/15/2015  . Other fatigue 04/15/2015  . Urge incontinence 04/15/2015  . Insomnia 04/15/2015  . Memory disturbance 04/15/2015  . Bilateral arm weakness 03/28/2015  . Interstitial cystitis 03/28/2015  . Influenza with respiratory manifestation other than pneumonia 11/29/2014  . Sore in nose 10/16/2014  . Thrush 08/22/2014  . Acne cystica 07/31/2014  . Cervical spondylosis with myelopathy and radiculopathy 07/03/2014  . Hearing loss in left ear 05/30/2014  . Special screening for malignant neoplasms, colon 05/02/2014  . Unspecified constipation 05/02/2014  . Common migraine without aura 04/23/2014  . Loss of weight 04/11/2014  . MS (multiple sclerosis) ([place]) 04/11/2014  . Diffuse pain 04/11/2014  . Glucosuria 01/16/2014  . Depression with anxiety 01/16/2014  . Recurrent cold sores 01/16/2014  . HYPOKALEMIA 10/02/2007  . ANEMIA, IRON DEFICIENCY 10/02/2007  . UTI 10/02/2007  . URI 08/08/2007  . CONTUSION OF UNSPECIFIED SITE 08/01/2007    Past Surgical  History:  Procedure Laterality Date  . ANTERIOR CERVICAL DECOMPRESSION/DISCECTOMY FUSION 4 LEVELS N/A 07/03/2014   Procedure: ANTERIOR CERVICAL DECOMPRESSION/DISCECTOMY FUSION 4 LEVELS;  Surgeon: Newman Pies, MD;  Location: [facility];  Service: Neurosurgery;  Laterality: N/A;  C3-4 C4-5 C5-6 C6-7 Anterior cervical decompression/diskectomy/fusion/interbody prosthesis/plate  . APPENDECTOMY    . CESAREAN SECTION  1994  . CYSTOSCOPY WITH HYDRODISTENSION AND BIOPSY Bilateral 02/15/2014   Procedure: CYSTOSCOPY  BILATERAL RETROGRADE PYLOGRAM, HYDRODISTENSION,  INSTILATION OF MARCAINE AND PYRIDIUM;  Surgeon: Ardis Hughs, MD;  Location: [facility];  Service: Urology;  Laterality: Bilateral;  . D & C HYSTEROSCOPY WITH POLYPECTOMY  12-02-2003  . DILATION AND CURETTAGE OF UTERUS    . DX LAPAROSCOPY/  FULGERATION ENDOMETRIOSIS/  APPENDECTOMY  1985  . RADIOACTIVE SEED GUIDED MASTECTOMY WITH AXILLARY SENTINEL LYMPH NODE BIOPSY Left 06/07/2016   Procedure: BREAST LUMPECTOMY WITH RADIOACTIVE SEED AND SENTINEL LYMPH NODE BIOPSY, INJECT BLUE DYE LEFT BREAST;  Surgeon: Fanny Skates, MD;  Location: [facility];  Service: General;  Laterality: Left;  . TONSILLECTOMY AND ADENOIDECTOMY  1972    OB History    No data available       Home Medications    Prior to Admission medications   Medication Sig Start Date End Date Taking? Authorizing Provider  amitriptyline (ELAVIL) 25 MG tablet Take 1-2 tablets (25-50 mg total) by mouth at bedtime. 01/20/17  Yes Sater, Nanine Means, MD  anastrozole (ARIMIDEX) 1 MG tablet Take 1 mg by mouth daily.   Yes [provider]  baclofen (LIORESAL) 10 MG tablet TAKE 1 TABLET BY MOUTH TWICE A DAY 03/14/17  Yes Sater, Richard A, MD  diazepam (VALIUM) 5 MG tablet TAKE 1 TABLET BY MOUTH EVERY 8 HOURS AS NEEDED FOR MUSCLE SPASMS Patient taking differently: TAKE 2 TABLETs BY MOUTH EVERY 8 HOURS AS NEEDED FOR MUSCLE SPASMS 03/31/17  Yes Sater, Nanine Means, MD  Dimethyl Fumarate (TECFIDERA) 240 MG CPDR Take 1 capsule (240 mg total) by mouth 2 (two) times daily. 11/11/16  Yes Sater, Nanine Means, MD  gabapentin (NEURONTIN) 300 MG capsule TAKE 1 CAPSULE (300 MG TOTAL) BY MOUTH 3 (THREE) TIMES DAILY. 03/14/17  Yes Sater, Nanine Means, MD  Meth-Hyo-M Barnett Hatter Phos-Ph Sal (URIBEL) 118 MG CAPS Take 1 capsule by mouth 3 (three) times daily as needed (PAIN/UTI).    Yes [provider]  methylphenidate (RITALIN) 20 MG tablet Take 1 tablet (20 mg total) by mouth 2 (two) times daily with breakfast and lunch. 02/23/17   Yes Sater, Nanine Means, MD  naproxen (NAPROSYN) 500 MG tablet Take 1 tablet (500 mg total) by mouth 2 (two) times daily with a meal. 03/08/16  Yes Sater, Nanine Means, MD  OnabotulinumtoxinA (BOTOX IM) Inject into the muscle. Facial to treat MS   Yes [provider]  pantoprazole (PROTONIX) 40 MG tablet Take 1 tablet (40 mg total) by mouth daily. 02/07/17  Yes Pyrtle, Lajuan Lines, MD  pramoxine-hydrocortisone (PROCTOCREAM-HC) 1-1 % rectal cream Place 1 application rectally 2 (two) times daily as needed for hemorrhoids or itching. 03/15/17  Yes Pyrtle, Lajuan Lines, MD  SUMAtriptan (IMITREX) 100 MG tablet TAKE 1 TAB BY MOUTH EVERY 2 HRS AS NEEDED FOR MIGRAINE. MEAY REPEAT IN 2 HRS IF HEADACHE PERSISTS 02/24/17  Yes Brunetta Jeans, PA-C  valACYclovir (VALTREX) 1000 MG tablet TAKE 2 TABLETS EVERY 12 HOURS X2 DOSES AS NEEDED FOR COLD SORES 04/18/17  Yes Brunetta Jeans, PA-C  venlafaxine XR (EFFEXOR-XR) 75 MG 24  hr capsule Take 1 capsule (75 mg total) by mouth daily with breakfast. 07/16/15  Yes Sater, Nanine Means, MD  letrozole [facility]) 2.5 MG tablet Take 1 tablet (2.5 mg total) by mouth daily. Patient not taking: Reported on 05/10/2017 09/06/16   Nicholas Lose, MD  oxyCODONE-acetaminophen (PERCOCET/ROXICET) 5-325 MG tablet Take 1-2 tablets by mouth every 6 (six) hours as needed for severe pain. 05/10/17   Russo, [place] N, PA-C    Family History Family History  Problem Relation Age of Onset  . Hypertension Mother   . Diabetes Father   . Prostate cancer Father        dx late 58s  . Lung cancer Father 30       smoker  . Cancer Father        cheek of mouth, dx. between 48 and 81  . Diabetes Paternal Grandmother   . Diabetes Paternal Grandfather   . Aneurysm Maternal Grandmother 76       d. brain aneurysm  . Prostate cancer Maternal Grandfather   . Diabetes Paternal Uncle        x 4  . Diabetes Paternal Aunt   . Colon cancer Maternal Uncle   . Cancer Maternal Uncle        mouth cancer; +tobacco  .  Breast cancer Sister 32       s/p mastectomy  . Breast cancer Maternal Aunt        dx 65s  . Lymphoma Maternal Aunt 83  . Uterine cancer Maternal Aunt        d. late 65s  . Throat cancer Cousin        maternal 1st cousin; lim info  . Cancer Paternal Uncle        NOS cancer  . Breast cancer Cousin        paternal 1st cousin dx 14s  . Esophageal cancer Neg Hx   . Rectal cancer Neg Hx   . Stomach cancer Neg Hx     Social History Social History  Substance Use Topics  . Smoking status: Former Smoker    Packs/day: 0.25    Years: 27.00    Types: Cigarettes    Quit date: 01/17/2011  . Smokeless tobacco: Never Used  . Alcohol use No     Allergies   Codeine; Lamictal [lamotrigine]; Meperidine; Meperidine hcl; and Morphine   Review of Systems Review of Systems  Constitutional: Negative for chills and fever.  HENT: Negative for facial swelling.   Eyes: Negative for visual disturbance.  Respiratory: Negative for shortness of breath.   Cardiovascular: Negative for chest pain.  Gastrointestinal: Negative for abdominal pain and nausea.  Genitourinary: Negative for difficulty urinating.  Musculoskeletal: Positive for arthralgias and myalgias. Negative for back pain and neck pain.  Skin: Positive for wound (left abrasion to elbow). Negative for color change.  Neurological: Negative for syncope, numbness and headaches.  Hematological: Does not bruise/bleed easily.     Physical Exam Updated Vital Signs BP 123/84 (BP Location: Right Arm)   Pulse 81   Temp 99.2 F (37.3 C) (Oral)   Resp 18   Ht 5\' 9"  (1.753 m)   Wt 52.2 kg (115 lb)   SpO2 95%   BMI 16.98 kg/m   Physical Exam  Constitutional: She is oriented to person, place, and time. She appears well-developed and well-nourished.  HENT:  Head: Normocephalic and atraumatic.  Mouth/Throat: Oropharynx is clear and moist.  Eyes: Pupils are equal, round, and reactive to light. Conjunctivae  and EOM are normal.  Neck: Normal  range of motion. Neck supple.  Cardiovascular: Normal rate, regular rhythm, normal heart sounds and intact distal pulses.   Pulmonary/Chest: Effort normal and breath sounds normal. No respiratory distress. She has no wheezes.  Abdominal: Soft. Bowel sounds are normal. She exhibits no distension. There is no tenderness.  Musculoskeletal:  No spinal or paraspinal tenderness. No bony step-offs, no gross deformities. Shoulder with diffuse tenderness, as well as left bicep, and left forearm. Wrist and elbow with full range of motion. No gross deformities. Left hip with normal range of motion including internal and external rotation and flexion and extension.   Neurological: She is alert and oriented to person, place, and time.  Normal sensation distal extremities.  Skin: Skin is warm.  Psychiatric: She has a normal mood and affect. Her behavior is normal.  Nursing note and vitals reviewed.    ED Treatments / Results  Labs (all labs ordered are listed, but only abnormal results are displayed) Labs Reviewed - No data to display  EKG  EKG Interpretation None       Radiology Dg Forearm Left  Result Date: 05/10/2017 CLINICAL DATA:  Fall.  Left arm pain. EXAM: LEFT FOREARM - 2 VIEW COMPARISON:  None FINDINGS: There is no evidence of fracture or other focal bone lesions. Soft tissues are unremarkable. IMPRESSION: Negative. Electronically Signed   By: Kerby Moors M.D.   On: 05/10/2017 16:24   Dg Shoulder Left  Result Date: 05/10/2017 CLINICAL DATA:  Fall today. Diffuse left shoulder pain with limited range of motion. Initial encounter. EXAM: LEFT SHOULDER - 2+ VIEW COMPARISON:  Radiographs 07/30/2007. FINDINGS: Nonstandard projections. The bones appear mildly demineralized. No evidence of acute fracture or dislocation. The subacromial space is preserved. There are minimal acromioclavicular degenerative changes. Previous cervical fusion noted. IMPRESSION: No evidence of acute left shoulder  injury. Electronically Signed   By: Richardean Sale M.D.   On: 05/10/2017 15:17   Dg Humerus Left  Result Date: 05/10/2017 CLINICAL DATA:  Pain EXAM: LEFT HUMERUS - 2+ VIEW COMPARISON:  None. FINDINGS: Frontal and lateral views were obtained. There is an apparent fracture along the greater tuberosity of the proximal humerus with alignment near anatomic. No other fracture no dislocation. No appreciable joint space narrowing or erosion. IMPRESSION: Fracture along the greater tuberosity of the proximal humerus with alignment near anatomic. No other fracture. No dislocation. No apparent arthropathy. Electronically Signed   By: Lowella Grip III M.D.   On: 05/10/2017 18:24   Dg Hip Unilat With Pelvis 2-3 Views Left  Result Date: 05/10/2017 CLINICAL DATA:  Fall today.  Left thigh pain.  Initial encounter. EXAM: DG HIP (WITH OR WITHOUT PELVIS) 2-3V LEFT COMPARISON:  None. FINDINGS: The mineralization and alignment are normal. There is no evidence of acute fracture or dislocation. The hip joint spaces are maintained. No evidence of femoral head avascular necrosis. There are multiple pelvic calcifications bilaterally, likely all phleboliths. There is a probable bone island in the right superior acetabulum. IMPRESSION: No evidence of acute fracture or dislocation. Electronically Signed   By: Richardean Sale M.D.   On: 05/10/2017 15:20   Dg Femur Min 2 Views Left  Result Date: 05/10/2017 CLINICAL DATA:  Fall today.  Left thigh pain.  Initial encounter. EXAM: LEFT FEMUR 2 VIEWS COMPARISON:  None. FINDINGS: AP and lateral views of the mid to distal femur. The proximal femur is included on hip examination. The mineralization and alignment are normal. There is no  evidence of acute fracture or dislocation. The knee joint spaces are maintained. No significant knee joint effusion. IMPRESSION: No acute osseous findings. Electronically Signed   By: Richardean Sale M.D.   On: 05/10/2017 15:19    Procedures Procedures  (including critical care time)  Medications Ordered in ED Medications  HYDROmorphone (DILAUDID) injection 1 mg (1 mg Intravenous Given 05/10/17 1612)  oxyCODONE-acetaminophen (PERCOCET/ROXICET) 5-325 MG per tablet 1 tablet (1 tablet Oral Given 05/10/17 1848)     Initial Impression / Assessment and Plan / ED Course  I have reviewed the triage vital signs and the nursing notes.  Pertinent labs & imaging results that were available during my care of the patient were reviewed by me and considered in my medical decision making (see chart for details).     Patient s/p mechanical fall w fracture along the greater tuberosity of the left proximal humerus. Remainder of imaging normal. Pain managed in ED. NV intact. Arm placed in a sling and Pt advised to follow up with orthopedics for further evaluation and treatment.  Conservative therapy recommended and discussed. Patient will be dc home & is agreeable with above plan.  Discussed results, findings, treatment and follow up. Patient advised of return precautions. Patient verbalized understanding and agreed with plan.  Final Clinical Impressions(s) / ED Diagnoses   Final diagnoses:  Closed displaced fracture of greater tuberosity of left humerus, initial encounter  Fall, initial encounter  Left hip pain    New Prescriptions Discharge Medication List as of 05/10/2017  7:30 PM    START taking these medications   Details  oxyCODONE-acetaminophen (PERCOCET/ROXICET) 5-325 MG tablet Take 1-2 tablets by mouth every 6 (six) hours as needed for severe pain., Starting Tue 05/10/2017, Print         Virgina Jock, [place] N, PA-C 05/10/17 6599    Malvin Johns, MD 05/13/17 [phone]

## 2017-05-10 NOTE — ED Notes (Signed)
Left arm sling applied.. Pt tolerated well.

## 2017-05-12 DIAGNOSIS — M25552 Pain in left hip: Secondary | ICD-10-CM | POA: Diagnosis not present

## 2017-05-12 DIAGNOSIS — M25512 Pain in left shoulder: Secondary | ICD-10-CM | POA: Diagnosis not present

## 2017-05-16 ENCOUNTER — Other Ambulatory Visit: Payer: BLUE CROSS/BLUE SHIELD

## 2017-05-18 DIAGNOSIS — S42255A Nondisplaced fracture of greater tuberosity of left humerus, initial encounter for closed fracture: Secondary | ICD-10-CM | POA: Diagnosis not present

## 2017-05-18 DIAGNOSIS — M25512 Pain in left shoulder: Secondary | ICD-10-CM | POA: Diagnosis not present

## 2017-05-25 ENCOUNTER — Encounter: Payer: Self-pay | Admitting: Internal Medicine

## 2017-05-25 ENCOUNTER — Ambulatory Visit (INDEPENDENT_AMBULATORY_CARE_PROVIDER_SITE_OTHER): Payer: Medicare Other | Admitting: Internal Medicine

## 2017-05-25 VITALS — BP 104/72 | HR 90 | Ht 69.5 in | Wt 115.0 lb

## 2017-05-25 DIAGNOSIS — K648 Other hemorrhoids: Secondary | ICD-10-CM

## 2017-05-25 NOTE — Patient Instructions (Addendum)
You have been scheduled for your 2nd hemorrhodal banding on Tuesday, 06/14/17 at 3:45 pm.  HEMORRHOID BANDING PROCEDURE    FOLLOW-UP CARE   1. The procedure you have had should have been relatively painless since the banding of the area involved does not have nerve endings and there is no pain sensation.  The rubber band cuts off the blood supply to the hemorrhoid and the band may fall off as soon as 48 hours after the banding (the band may occasionally be seen in the toilet bowl following a bowel movement). You may notice a temporary feeling of fullness in the rectum which should respond adequately to plain Tylenol or Motrin.  2. Following the banding, avoid strenuous exercise that evening and resume full activity the next day.  A sitz bath (soaking in a warm tub) or bidet is soothing, and can be useful for cleansing the area after bowel movements.     3. To avoid constipation, take two tablespoons of natural wheat bran, natural oat bran, flax, Benefiber or any over the counter fiber supplement and increase your water intake to 7-8 glasses daily.    4. Unless you have been prescribed anorectal medication, do not put anything inside your rectum for two weeks: No suppositories, enemas, fingers, etc.  5. Occasionally, you may have more bleeding than usual after the banding procedure.  This is often from the untreated hemorrhoids rather than the treated one.  Don't be concerned if there is a tablespoon or so of blood.  If there is more blood than this, lie flat with your bottom higher than your head and apply an ice pack to the area. If the bleeding does not stop within a half an hour or if you feel faint, call our office at [phone] or go to the emergency room.  6. Problems are not common; however, if there is a substantial amount of bleeding, severe pain, chills, fever or difficulty passing urine (very rare) or other problems, you should call us at [phone] or report to the nearest  emergency room.  7. Do not stay seated continuously for more than 2-3 hours for a day or two after the procedure.  Tighten your buttock muscles 10-15 times every two hours and take 10-15 deep breaths every 1-2 hours.  Do not spend more than a few minutes on the toilet if you cannot empty your bowel; instead re-visit the toilet at a later time.

## 2017-05-25 NOTE — Progress Notes (Signed)
Stacy Logan is a 51 year old female with a past medical history of adenomatous colon polyps, internal hemorrhoids, multiple sclerosis, left breast cancer status post radiation, RSD, IC, and recent fall with left humeral fracture who presents for hemorrhoidal banding.  She had a colonoscopy performed on 03/15/2017 which revealed 2 polyps both 6 mm in size. One was tubular adenoma the other a sessile serrated polyp. There is multiple diverticuli in the sigmoid colon along with medium-sized internal and external hemorrhoids.  She reports long-standing issues on and off with her hemorrhoids. This includes prolapse, sometimes rectal bleeding, and. No itching. No prior hemorrhoidal treatment.   PROCEDURE NOTE:  The patient presents with symptomatic grade 2-3 internal hemorrhoids, requesting rubber band ligation of her hemorrhoidal disease.  All risks, benefits and alternative forms of therapy were described and informed consent was obtained.   The anorectum was pre-medicated with 0.125% nitroglycerin ointment The decision was made to band the LL internal hemorrhoid, and the [place] was used to perform band ligation without complication.   Digital anorectal examination was then performed to assure proper positioning of the band, and to adjust the banded tissue as required.  The patient was discharged home without pain or other issues.  Dietary and behavioral recommendations were given and along with follow-up instructions.     The patient will return as scheduled for follow-up and possible additional banding as required. No complications were encountered and the patient tolerated the procedure well.

## 2017-05-31 IMAGING — US US SOFT TISSUE HEAD/NECK
1 series · 14 of 25 positions shown · non-contrast
Comparison: 04/14/2015

CLINICAL DATA: Follow-up nodules

EXAM:
THYROID ULTRASOUND
TECHNIQUE: Ultrasound examination of the thyroid gland and adjacent soft
tissues was performed.

[Series 1: us soft tissue head/neck · 0.06mm/px · 14 of 29 slices shown]
[im 1/29]
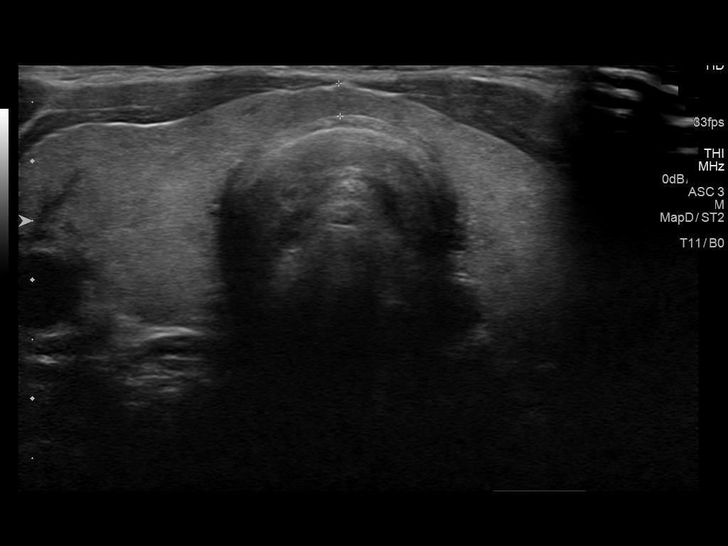
[im 3/29]
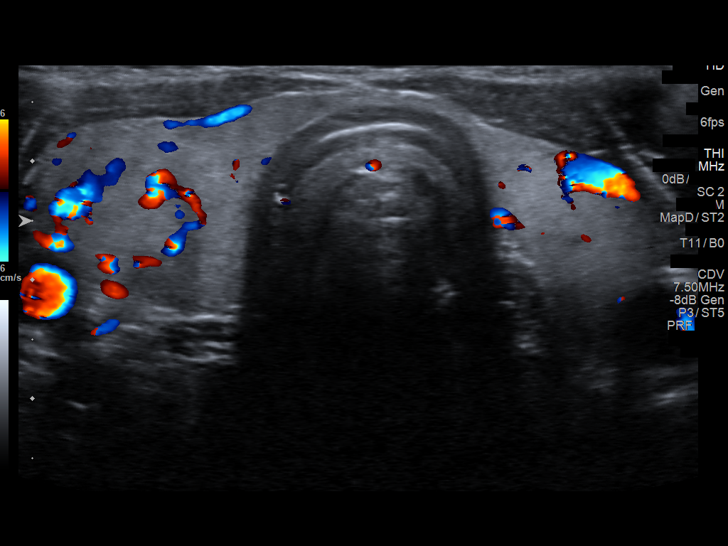
[im 5/29]
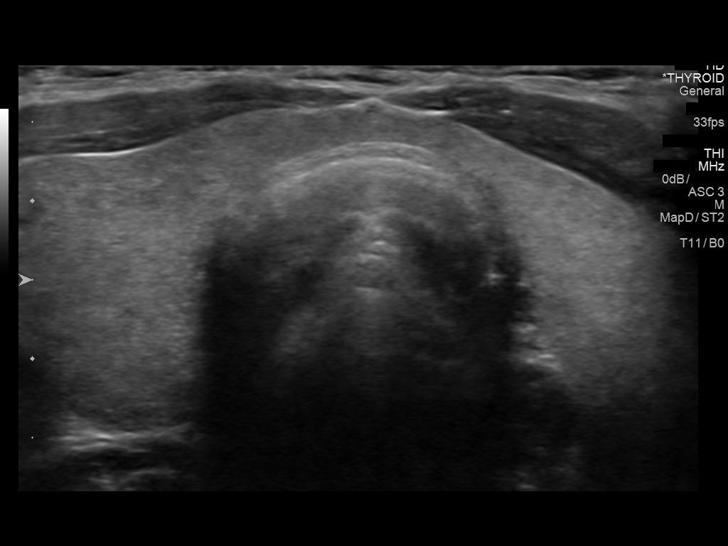
[im 8/29]
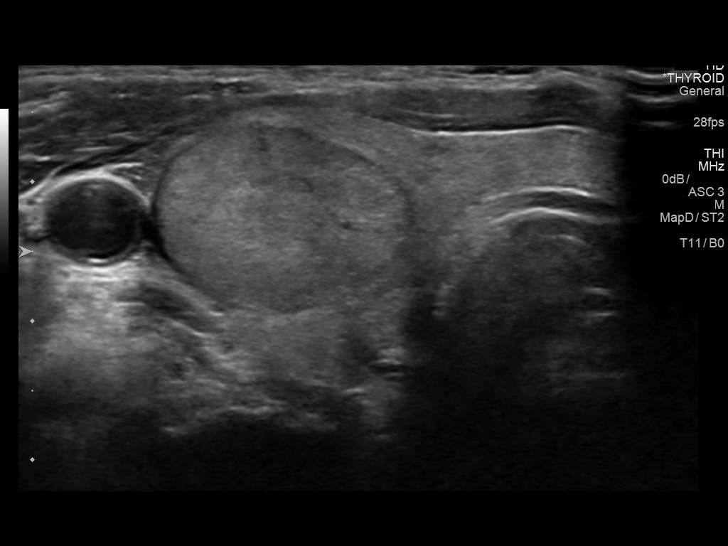
[im 10/29]
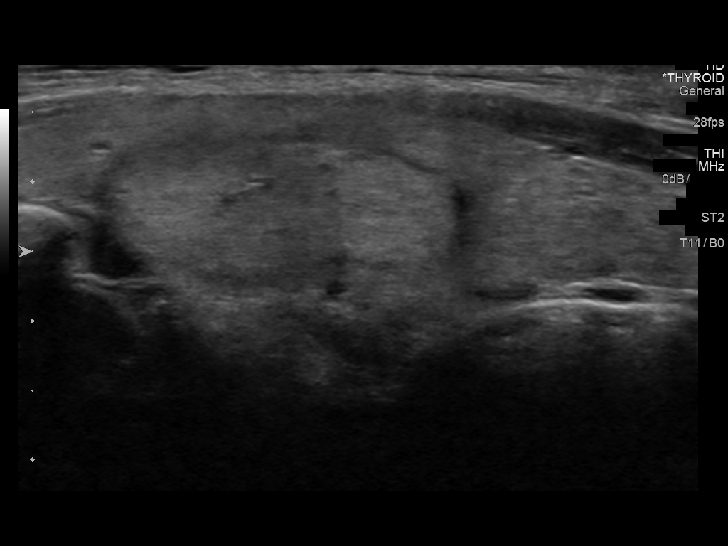
[im 11/29]
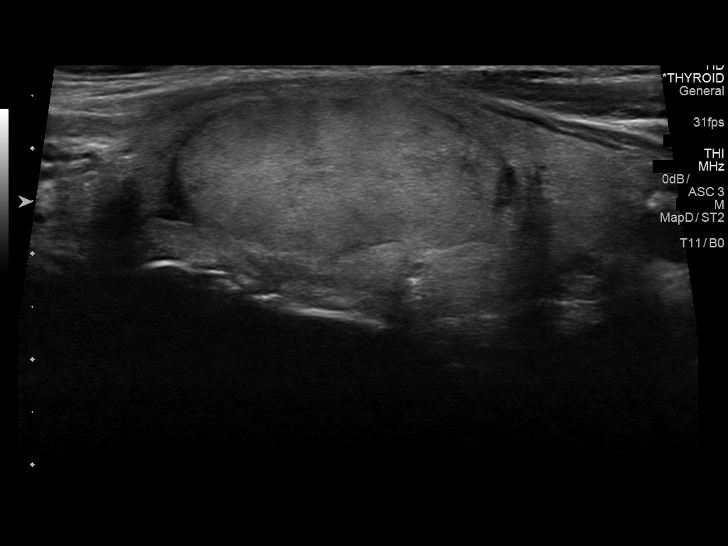
[im 13/29]
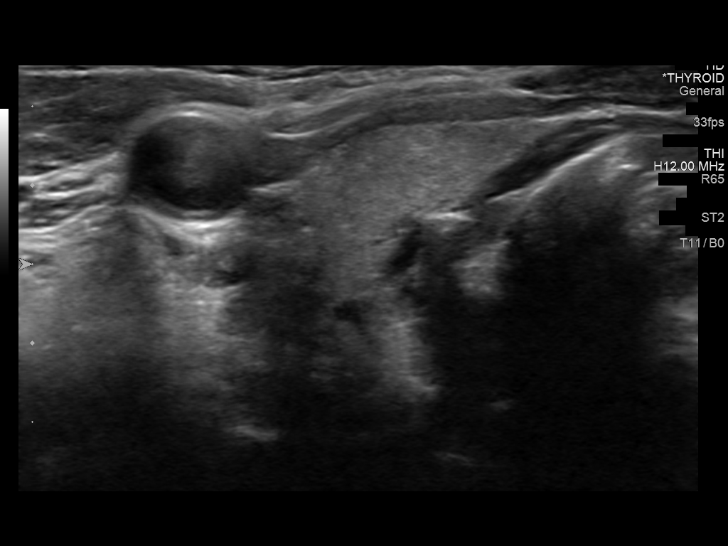
[im 16/29]
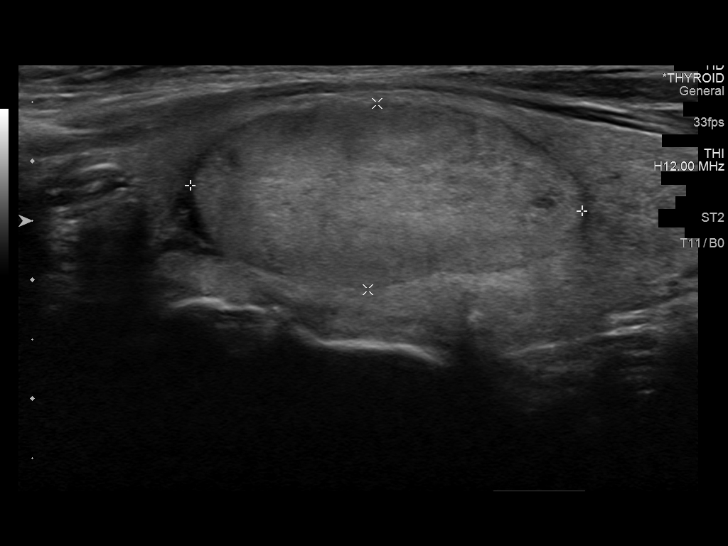
[im 18/29]
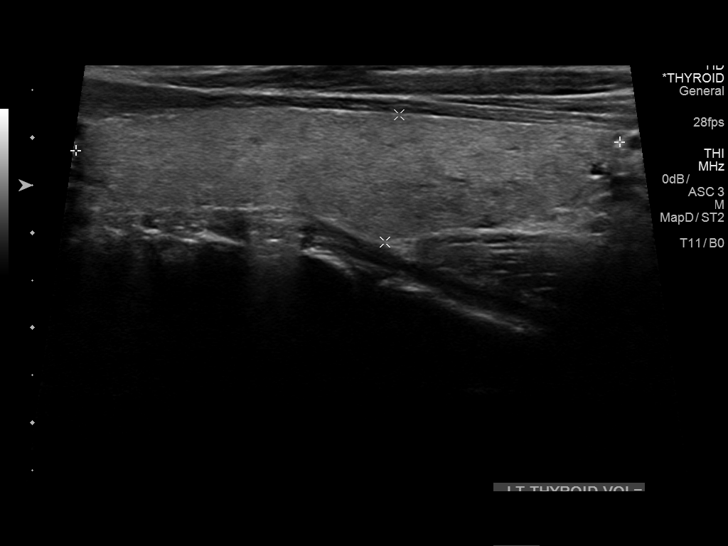
[im 19/29]
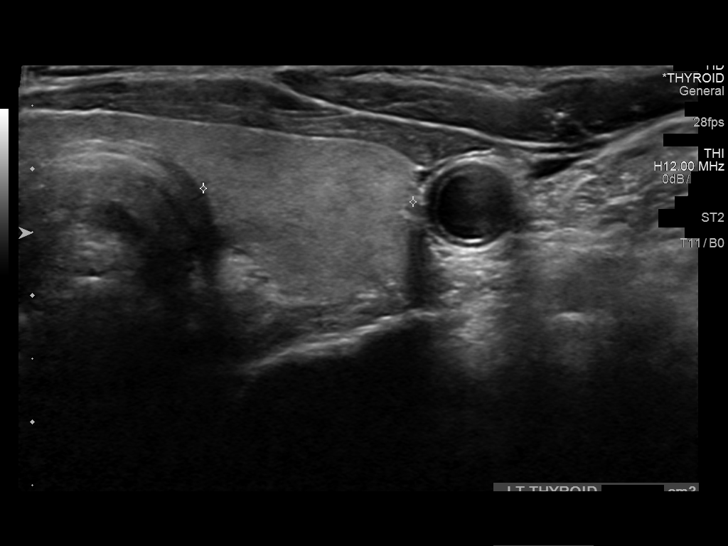
[im 22/29]
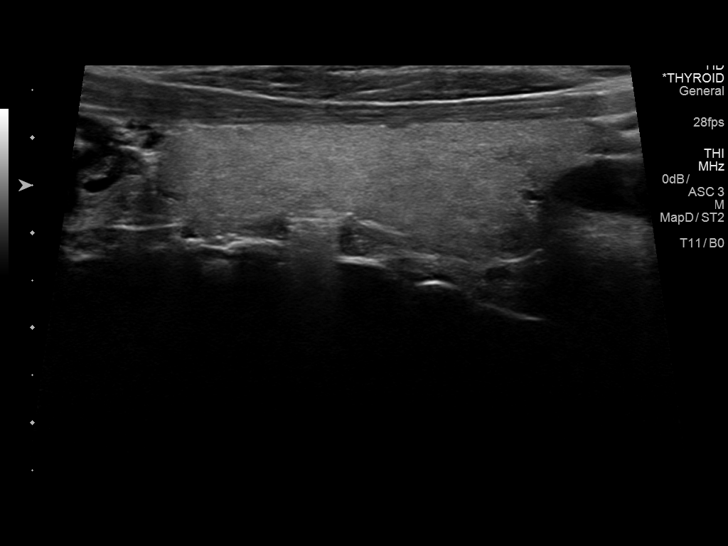
[im 24/29]
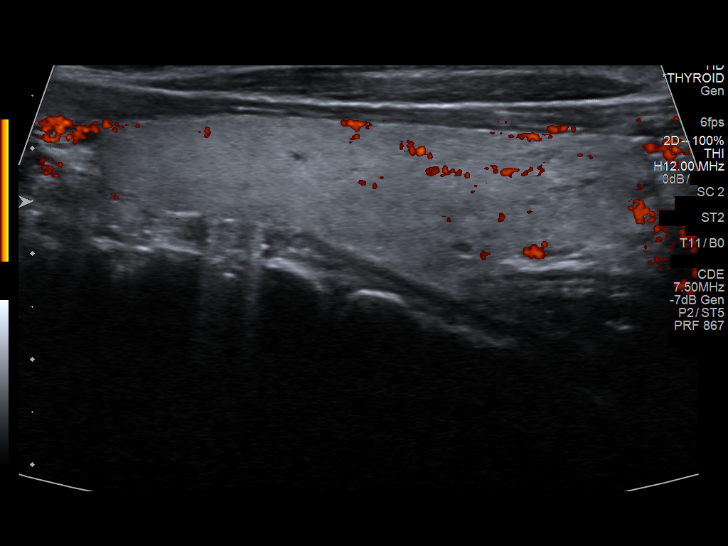
[im 26/29]
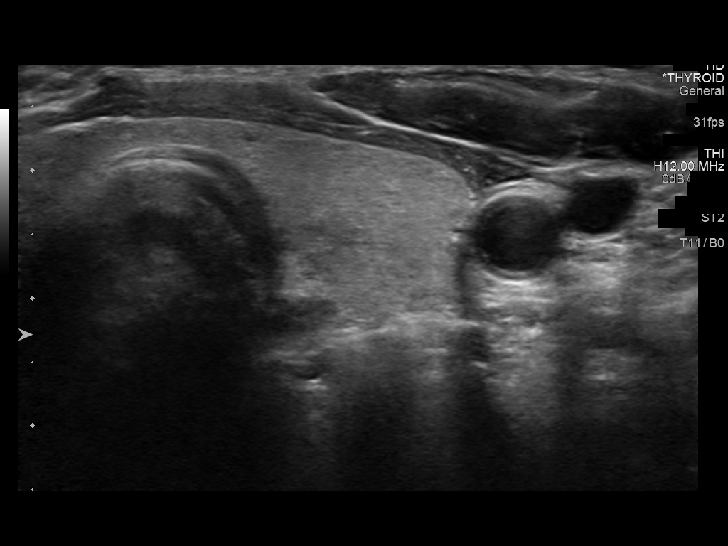
[im 29/29]
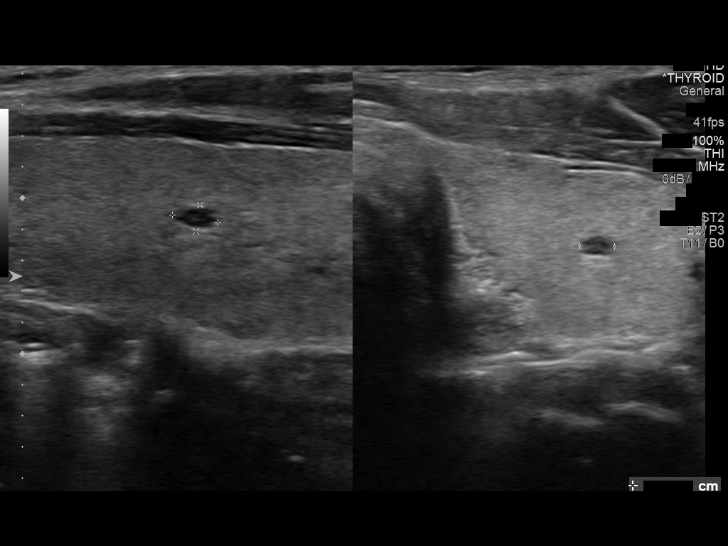

[14 of 25 positions shown; findings below may reference images not displayed]

FINDINGS: Right thyroid lobe

Measurements: 6.3 x 2.3 x 2.1 cm. Solitary mid lobe nodule measures
3.3 x 1.6 x 1.9 cm and previously measured 3.3 x 1.6 x 2.4 cm.

Left thyroid lobe

Measurements: 5.7 x 1.4 x 1.7 cm.. Mid lobe nodule measures 6 mm and
previously was measured at 5 mm. Upper pole nodule measures 3 mm.

Isthmus

Thickness: 3 mm.  No nodules visualized.

Lymphadenopathy

None visualized.
IMPRESSION: No significant change compared with the study from 3 weeks ago.
Findings meet consensus criteria for biopsy. Ultrasound-guided fine
needle aspiration should be considered, as per the consensus
statement: Management of Thyroid Nodules Detected at US: Society of
Radiologists in Ultrasound Consensus Conference Statement. Radiology

## 2017-06-01 ENCOUNTER — Telehealth: Payer: Self-pay | Admitting: Neurology

## 2017-06-01 DIAGNOSIS — M25522 Pain in left elbow: Secondary | ICD-10-CM | POA: Diagnosis not present

## 2017-06-01 DIAGNOSIS — M25512 Pain in left shoulder: Secondary | ICD-10-CM | POA: Diagnosis not present

## 2017-06-01 NOTE — Telephone Encounter (Signed)
Kristene from [facility] has called for a prescription of Botox for pt, please call [phone]

## 2017-06-02 ENCOUNTER — Ambulatory Visit: Payer: BLUE CROSS/BLUE SHIELD | Admitting: Neurology

## 2017-06-02 ENCOUNTER — Other Ambulatory Visit: Payer: Self-pay | Admitting: *Deleted

## 2017-06-02 DIAGNOSIS — N760 Acute vaginitis: Secondary | ICD-10-CM | POA: Diagnosis not present

## 2017-06-02 DIAGNOSIS — Z681 Body mass index (BMI) 19 or less, adult: Secondary | ICD-10-CM | POA: Diagnosis not present

## 2017-06-02 DIAGNOSIS — Z779 Other contact with and (suspected) exposures hazardous to health: Secondary | ICD-10-CM | POA: Diagnosis not present

## 2017-06-02 MED ORDER — PANTOPRAZOLE SODIUM 40 MG PO TBEC: 40.0000 mg | DELAYED_RELEASE_TABLET | Freq: Every day | ORAL | 1 refills | Status: DC

## 2017-06-07 NOTE — Telephone Encounter (Signed)
Naomi/[facility] [phone] called in she needs RX to fill and send botox for appt on 9/20. Please call

## 2017-06-07 NOTE — Telephone Encounter (Signed)
I called pharmacy back and gave rx.

## 2017-06-09 ENCOUNTER — Encounter: Payer: Self-pay | Admitting: Neurology

## 2017-06-09 ENCOUNTER — Ambulatory Visit (INDEPENDENT_AMBULATORY_CARE_PROVIDER_SITE_OTHER): Payer: Medicare Other | Admitting: Neurology

## 2017-06-09 ENCOUNTER — Telehealth: Payer: Self-pay | Admitting: Neurology

## 2017-06-09 VITALS — BP 123/82 | HR 102 | Resp 18 | Ht 69.5 in | Wt 117.5 lb

## 2017-06-09 DIAGNOSIS — G513 Clonic hemifacial spasm: Secondary | ICD-10-CM | POA: Diagnosis not present

## 2017-06-09 DIAGNOSIS — G43009 Migraine without aura, not intractable, without status migrainosus: Secondary | ICD-10-CM

## 2017-06-09 DIAGNOSIS — G35 Multiple sclerosis: Secondary | ICD-10-CM | POA: Diagnosis not present

## 2017-06-09 DIAGNOSIS — G5139 Clonic hemifacial spasm, unspecified: Secondary | ICD-10-CM

## 2017-06-09 DIAGNOSIS — R5383 Other fatigue: Secondary | ICD-10-CM

## 2017-06-09 DIAGNOSIS — G245 Blepharospasm: Secondary | ICD-10-CM | POA: Diagnosis not present

## 2017-06-09 DIAGNOSIS — R208 Other disturbances of skin sensation: Secondary | ICD-10-CM

## 2017-06-09 MED ORDER — METHYLPHENIDATE HCL 20 MG PO TABS: 20.0000 mg | ORAL_TABLET | Freq: Two times a day (BID) | ORAL | 0 refills | Status: DC

## 2017-06-09 NOTE — Telephone Encounter (Signed)
Per. Dr. Felecia Shelling, pt needs botox in 3 months.

## 2017-06-09 NOTE — Progress Notes (Signed)
[facility]  PATIENT: Stacy Logan DOB: 1965/10/23  REFERRING DOCTOR OR PCP:  Annye Asa SOURCE: patient, EMR records from [facility].  MRI images on PACS  _________________________________   HISTORICAL  CHIEF COMPLAINT:  Chief Complaint  Patient presents with  . Multiple Sclerosis    Botox 100u, 1 vial. Lot# [id number]. Exp. 12/2019. [place] [phone].  Sts. she continues to tolerate Tecfidera well.  Sts. fatigue is worse.  "I slept thru July."  Since last ov, she has fallen mult. times and suffered fx. left shoulder, injuries to bilat elbows./fim  . Hemifacial Spasms    HISTORY OF PRESENT ILLNESS:  Stacy Logan is a 51 yo woman with multiple sclerosis who also has right hemifacial spasms/bilateral blepharospasm (R>L).   She also has migraine headaches      Hemifacial spasms/blepharospasm:    She feels that the right hemifacial spasms in her blepharospasm improved tremendously after the last Botox therapy and the spasms have continued to do well the entire 3 months. Did not note any eyelid drooping with the last treatment course.   She tolerated it well and there were no complications. She does not note any difficulty with swallowing.  Migraine/Neck pain:  Her migraines also improved after the Botox.. At times the neck pain radiating to the right arm.Marland Kitchen EMG showed mild chronic C7 radiculopathy in the past.   Both hands have numbness.  MS:  She has no definite exacerbation though feels worse with more fatigue.  She is remaining on Tecfidera and tolerates it well. She has not had any definite exacerbations since starting the medication.    Gait/strength/sensation/pain:  She fractured her left shoulder when she lost balance and fell.   She also broke her big toe during the fall.   The left leg does not have as much coordination as the right.   She also has a tight dysesthetic sensation in both feet and ankles and the arms have spasms.   Baclofen has helped the  spasticity. However, makes her sleepy which limits the dose. She just takes at bedtime. Lamotrigine and gabapentin did not help.   Bladder:   She has urinary frequency and urgency with occasional incontinence. This is mostly stable.  Vision/eyes:   She feels her vision is stable with no new blurriness and no changes in color vision. No eye pain..  Fatigue/sleep:  She has a lot of fatigue much worse with heat.    Ritalin has only helped a little bit.    Mood/cognition:   She notes more depression with all the medical issues.   She is now on a higher dose of Effexor (150 mg daily. She also notes cognitive issues and poor focus.   Specifically, organizing is difficult.   She is forgetful and easily distracted but this is stable..   She has trouble following through with tasks.   Ritalin helps a little bit  Breast Ca:   She was diagnosed with breast cancer and had surgery and now is don with RadRx and chemo   She feels much more tired since her diagnosis..     MS/spine history:   In 2001, she was noted to have left hearing loss and had an MRI of the brain.   She did not have numbness or weakness a that time.  She was told she likely had MS.   She saw a neurologist.  She never had an LP.   She reports she was told she had MS but was  never started on any medication.   Starting about 5-6 years ago, she was noting more difficulty with gait and also noted some memory issues.   She was also having bladder issues with urgency and bladder incontinence x 5 years.      However, she had no health insurance at that time and did not follow up with neurology.   She saw Dr. Tomi Likens last year.   He ordered an MRI of the brian and cervical spine.   She had one enhancing lesion on the spine and one additional lesion.  An LP was ordered but she opted not to do the test.   She was started on tecfidera.   She has had some itching and flushing, especially if she does not take after food.     She also was found to have severe spinal  stenosis and myelopathy.   She was sent to Dr. Arnoldo Morale of Neurosurgery.   She underwent  ACDF from C3-C7 06/2014.    She reports she is having more trouble with her arms since the surgery.   The worse pain is in the 2nd and 3rd fingers but she has altered sensation with some pain in the other fingers as well.   She also has right > left lower neck pain.  Bladder is about the same (maybe slightly better) as it was before surgery.      I have personally reviewed the MRI of the brain dated 01/12/2015 and the MRIs of the cervical spine dated 02/01/2015 and 05/22/2014.   The MRI of the brain shows white matter foci predominantly in the deep white matter but also in the periventricular and subcortical white matter of both hemispheres. The brainstem appears normal. There is no significant atrophy. The MRI of the cervical spine from 2015 showed severe spinal stenosis at C5-C6 and milder spinal stenosis at C6-C7 and C4-C5. There is an enhancing focus within the spinal cord adjacent to C5-C6 and she has another hyperintense focus at C3-C4. On the second MRI, done without contrast, she is status post C3-C7 ACDF.      REVIEW OF SYSTEMS: Constitutional: No fevers, chills, sweats, or change in appetite Eyes: as above Ear, nose and throat: No hearing loss, ear pain, nasal congestion, sore throat Cardiovascular: No chest pain, palpitations Respiratory: No shortness of breath at rest or with exertion.   No wheezes GastrointestinaI: No nausea, vomiting, diarrhea, abdominal pain, fecal incontinence Genitourinary: see above Musculoskeletal: Some neck pain, back pain Integumentary: No rash, pruritus, skin lesions Neurological: as above Psychiatric: Notes depression and anxiety Endocrine: No palpitations, diaphoresis, change in appetite, change in weigh or increased thirst Hematologic/Lymphatic: No anemia, purpura, petechiae. Allergic/Immunologic: No itchy/runny eyes, nasal congestion, recent allergic reactions,  rashes  ALLERGIES: Allergies  Allergen Reactions  . Codeine     Makes her "busy", itchy  . Lamictal [Lamotrigine] Other (See Comments)    Mood changes   . Meperidine Nausea And Vomiting  . Meperidine Hcl Nausea And Vomiting    SEVERE N/V  . Morphine Other (See Comments)    "SKIN CRAWLS"    HOME MEDICATIONS:  Current Outpatient Prescriptions:  .  amitriptyline (ELAVIL) 25 MG tablet, Take 1-2 tablets (25-50 mg total) by mouth at bedtime., Disp: 180 tablet, Rfl: 3 .  anastrozole (ARIMIDEX) 1 MG tablet, Take 1 mg by mouth daily., Disp: , Rfl:  .  baclofen (LIORESAL) 10 MG tablet, TAKE 1 TABLET BY MOUTH TWICE A DAY, Disp: 60 tablet, Rfl: 2 .  diazepam (VALIUM)  5 MG tablet, TAKE 1 TABLET BY MOUTH EVERY 8 HOURS AS NEEDED FOR MUSCLE SPASMS (Patient taking differently: TAKE 2 TABLETs BY MOUTH EVERY 8 HOURS AS NEEDED FOR MUSCLE SPASMS), Disp: 90 tablet, Rfl: 3 .  Dimethyl Fumarate (TECFIDERA) 240 MG CPDR, Take 1 capsule (240 mg total) by mouth 2 (two) times daily., Disp: 180 capsule, Rfl: 3 .  gabapentin (NEURONTIN) 300 MG capsule, TAKE 1 CAPSULE (300 MG TOTAL) BY MOUTH 3 (THREE) TIMES DAILY., Disp: 90 capsule, Rfl: 11 .  letrozole (FEMARA) 2.5 MG tablet, Take 1 tablet (2.5 mg total) by mouth daily., Disp: 90 tablet, Rfl: 3 .  Meth-Hyo-M Bl-Na Phos-Ph Sal (URIBEL) 118 MG CAPS, Take 1 capsule by mouth 3 (three) times daily as needed (PAIN/UTI). , Disp: , Rfl:  .  methylphenidate (RITALIN) 20 MG tablet, Take 1 tablet (20 mg total) by mouth 2 (two) times daily with breakfast and lunch., Disp: 60 tablet, Rfl: 0 .  naproxen (NAPROSYN) 500 MG tablet, Take 1 tablet (500 mg total) by mouth 2 (two) times daily with a meal., Disp: 60 tablet, Rfl: 5 .  OnabotulinumtoxinA (BOTOX IM), Inject into the muscle. Facial to treat MS, Disp: , Rfl:  .  pantoprazole (PROTONIX) 40 MG tablet, Take 1 tablet (40 mg total) by mouth daily., Disp: 90 tablet, Rfl: 1 .  pramoxine-hydrocortisone (PROCTOCREAM-HC) 1-1 % rectal  cream, Place 1 application rectally 2 (two) times daily as needed for hemorrhoids or itching., Disp: 30 g, Rfl: 1 .  SUMAtriptan (IMITREX) 100 MG tablet, TAKE 1 TAB BY MOUTH EVERY 2 HRS AS NEEDED FOR MIGRAINE. MEAY REPEAT IN 2 HRS IF HEADACHE PERSISTS, Disp: 10 tablet, Rfl: 3 .  valACYclovir (VALTREX) 1000 MG tablet, TAKE 2 TABLETS EVERY 12 HOURS X2 DOSES AS NEEDED FOR COLD SORES, Disp: 30 tablet, Rfl: 0 .  venlafaxine XR (EFFEXOR-XR) 75 MG 24 hr capsule, Take 1 capsule (75 mg total) by mouth daily with breakfast., Disp: 30 capsule, Rfl: 5  Current Facility-Administered Medications:  .  0.9 %  sodium chloride infusion, 500 mL, Intravenous, Continuous, Pyrtle, Lajuan Lines, MD  PAST MEDICAL HISTORY: Past Medical History:  Diagnosis Date  . Anal fissure   . Anxiety   . Arthritis   . Borderline diabetes   . Cancer [facility])    left breast cancer  . Depression   . Frequency of urination   . H/O cold sores   . History of pneumothorax    1988-- SPONTANEOUS--  RESOLVED W/ CHEST TUBE  . History of radiation therapy 07/29/16- 08/27/16   Left Breast 40.05 Gy in 15 fractions, Left Breast Boost 10 Gy in 5 fractions.   . IC (interstitial cystitis)   . Internal hemorrhoids   . Lesion of bladder   . Migraines   . MS (multiple sclerosis) ([place])    MRI showed plague on her brain  . Nocturia   . Preeclampsia 1994  . RSD (reflex sympathetic dystrophy)   . Seasonal allergies   . Tubular adenoma of colon   . Urgency of urination     PAST SURGICAL HISTORY: Past Surgical History:  Procedure Laterality Date  . ANTERIOR CERVICAL DECOMPRESSION/DISCECTOMY FUSION 4 LEVELS N/A 07/03/2014   Procedure: ANTERIOR CERVICAL DECOMPRESSION/DISCECTOMY FUSION 4 LEVELS;  Surgeon: Newman Pies, MD;  Location: [facility];  Service: Neurosurgery;  Laterality: N/A;  C3-4 C4-5 C5-6 C6-7 Anterior cervical decompression/diskectomy/fusion/interbody prosthesis/plate  . APPENDECTOMY    . CESAREAN SECTION  1994  . CYSTOSCOPY WITH  HYDRODISTENSION AND BIOPSY Bilateral  02/15/2014   Procedure: CYSTOSCOPY  BILATERAL RETROGRADE PYLOGRAM, HYDRODISTENSION, INSTILATION OF MARCAINE AND PYRIDIUM;  Surgeon: Ardis Hughs, MD;  Location: [facility];  Service: Urology;  Laterality: Bilateral;  . D & C HYSTEROSCOPY WITH POLYPECTOMY  12-02-2003  . DILATION AND CURETTAGE OF UTERUS    . DX LAPAROSCOPY/  FULGERATION ENDOMETRIOSIS/  APPENDECTOMY  1985  . RADIOACTIVE SEED GUIDED MASTECTOMY WITH AXILLARY SENTINEL LYMPH NODE BIOPSY Left 06/07/2016   Procedure: BREAST LUMPECTOMY WITH RADIOACTIVE SEED AND SENTINEL LYMPH NODE BIOPSY, INJECT BLUE DYE LEFT BREAST;  Surgeon: Fanny Skates, MD;  Location: [place];  Service: General;  Laterality: Left;  . TONSILLECTOMY AND ADENOIDECTOMY  1972    FAMILY HISTORY: Family History  Problem Relation Age of Onset  . Hypertension Mother   . Diabetes Father   . Prostate cancer Father        dx late 59s  . Lung cancer Father 13       smoker  . Cancer Father        cheek of mouth, dx. between 51 and 31  . Diabetes Paternal Grandmother   . Diabetes Paternal Grandfather   . Aneurysm Maternal Grandmother 76       d. brain aneurysm  . Prostate cancer Maternal Grandfather   . Diabetes Paternal Uncle        x 4  . Diabetes Paternal Aunt   . Colon cancer Maternal Uncle   . Cancer Maternal Uncle        mouth cancer; +tobacco  . Breast cancer Sister 49       s/p mastectomy  . Breast cancer Maternal Aunt        dx 49s  . Lymphoma Maternal Aunt 83  . Uterine cancer Maternal Aunt        d. late 38s  . Throat cancer Cousin        maternal 1st cousin; lim info  . Cancer Paternal Uncle        NOS cancer  . Breast cancer Cousin        paternal 1st cousin dx 43s  . Esophageal cancer Neg Hx   . Rectal cancer Neg Hx   . Stomach cancer Neg Hx     SOCIAL HISTORY:  Social History   Social History  . Marital status: Single    Spouse name: N/A  . Number of  children: 1  . Years of education: N/A   Occupational History  . disabled    Social History Main Topics  . Smoking status: Former Smoker    Packs/day: 0.25    Years: 27.00    Types: Cigarettes    Quit date: 01/17/2011  . Smokeless tobacco: Never Used  . Alcohol use No  . Drug use: No  . Sexual activity: No   Other Topics Concern  . Not on file   Social History Narrative  . No narrative on file     PHYSICAL EXAM  Vitals:   06/09/17 1328  BP: 123/82  Pulse: (!) 102  Resp: 18  Weight: 117 lb 8 oz (53.3 kg)  Height: 5' 9.5" (1.765 m)    Body mass index is 17.1 kg/m.   General: The patient is a thin woman in no acute distress   Neurologic Exam  Mental status: The patient is alert and oriented x 3 at the time of the examination. The patient has apparent normal recent and remote memory, with an apparently normal attention span and concentration  ability.   Speech is normal.  Cranial nerves:  Marland Kitchen I did not see any hemifacial spasms or blepharospasm during the visit today. No ptosis noted. Patient strength and sensation is normal.  Trapezius and sternocleidomastoid strength is normal. No dysarthria is noted.  The tongue is midline, and the patient has symmetric elevation of the soft palate. No obvious hearing deficits are noted.  Motor:  Muscle bulk is normal.   Tone is normal. Strength is mildly reduced in the right triceps and intrinsic right hand muscles. Strength is normal in the legs.     Sensory: She has decreased sensation in the right C6 and C7 dermatomes.   '  Gait and station: Station is normal.   Gait is normal. Tandem gait is wide. Romberg is negative.   Reflexes: Deep tendon reflexes are increased in both legs with spread at the knees. There is no ankle clonus.Marland Kitchen       DIAGNOSTIC DATA (LABS, IMAGING, TESTING) - I reviewed patient records, labs, notes, testing and imaging myself where available.  Lab Results  Component Value Date   WBC 5.0 07/08/2016    HGB 13.1 07/08/2016   HCT 38.3 07/08/2016   MCV 91 07/08/2016   PLT 183 07/08/2016      Component Value Date/Time   NA 141 06/03/2016 1500   K 4.1 06/03/2016 1500   CL 105 06/03/2016 1500   CO2 30 06/03/2016 1500   GLUCOSE 94 06/03/2016 1500   BUN 12 06/03/2016 1500   CREATININE 0.73 06/03/2016 1500   CREATININE 0.55 12/26/2014 1042   CALCIUM 9.6 06/03/2016 1500   PROT 7.1 06/03/2016 1500   ALBUMIN 4.1 06/03/2016 1500   AST 21 06/03/2016 1500   ALT 16 06/03/2016 1500   ALKPHOS 75 06/03/2016 1500   BILITOT 0.7 06/03/2016 1500   GFRNONAA >60 06/03/2016 1500   GFRNONAA >89 12/26/2014 1042   GFRAA >60 06/03/2016 1500   GFRAA >89 12/26/2014 1042     __________________________________________________  BOTOX INJECTION  The risks and benefits of Botox injection brain to Mrs. Tuff. We discussed the possibility of ptosis as the most common side effect with injection for blepharospasm and hemifacial spasm.   The following injections were performed using sterile technique.  Right frontalis (2.5 units 2) Left frontalis (2.5 units 2) Procerus and corrugators (2.5 units 3) Orbicularis oculi (2.5 units 2 on the right upper and 2.5 units 1 on the right lower and 2.5 units upper on the left and 2.5 units lower on the left) Lateral canthus (2.5 units on each side)                  Right zygomaticus (5 units x 2 ) Buccinator (5 units right and 2.5 units left)    Occipitalis (2.5 U x 4)    60 U  Splenius capitis (10 units 2) C3 paraspinal (5 units 2) C7 paraspinal (5 units 2)   40 U     Total 100 Units    ASSESSMENT AND PLAN  MS (multiple sclerosis) ([facility])  Hemifacial spasm  Blepharospasm  Other fatigue  Dysesthesia  Migraine without aura and without status migrainosus, not intractable   1.   Botox (100 units) was injected into the muscles of the face and neck as described above for blepharospasm and hemifacial spasm and migraine 2.   Tecfidera for MS.   3.    Renew ritalin 20 mg 2 daily hypersomnia and MS fatigue 4.   She will return in 3 months  or sooner if there are new or worsening neurologic symptoms.    Richard A. Felecia Shelling, MD, PhD 6/43/5391, 2:25 PM Certified in Neurology, Clinical Neurophysiology, Sleep Medicine, Pain Medicine and Neuroimaging  [facility] [address] [place], [place] [address] [phone]

## 2017-06-14 ENCOUNTER — Encounter: Payer: BLUE CROSS/BLUE SHIELD | Admitting: Internal Medicine

## 2017-06-15 ENCOUNTER — Telehealth: Payer: Self-pay | Admitting: *Deleted

## 2017-06-15 NOTE — Telephone Encounter (Signed)
error 

## 2017-06-15 NOTE — Telephone Encounter (Signed)
No show letter mailed to patient. 

## 2017-07-07 ENCOUNTER — Ambulatory Visit (HOSPITAL_BASED_OUTPATIENT_CLINIC_OR_DEPARTMENT_OTHER): Payer: Medicare Other | Admitting: Hematology and Oncology

## 2017-07-07 ENCOUNTER — Telehealth: Payer: Self-pay | Admitting: Hematology and Oncology

## 2017-07-07 DIAGNOSIS — C50412 Malignant neoplasm of upper-outer quadrant of left female breast: Secondary | ICD-10-CM | POA: Diagnosis not present

## 2017-07-07 DIAGNOSIS — Z170 Estrogen receptor positive status [ER+]: Secondary | ICD-10-CM

## 2017-07-07 DIAGNOSIS — M25522 Pain in left elbow: Secondary | ICD-10-CM | POA: Diagnosis not present

## 2017-07-07 DIAGNOSIS — Z79811 Long term (current) use of aromatase inhibitors: Secondary | ICD-10-CM

## 2017-07-07 NOTE — Progress Notes (Signed)
Patient Care Team: Delorse Limber as PCP - General (Family Medicine) Newman Pies, MD as Consulting Physician (Neurosurgery) Ardis Hughs, MD as Attending Physician (Urology) Arvella Nigh, MD as Consulting Physician (Obstetrics and Gynecology) Nicholas Lose, MD as Consulting Physician (Hematology and Oncology) Fanny Skates, MD as Consulting Physician (General Surgery) Eppie Gibson, MD as Attending Physician (Radiation Oncology) Gardenia Phlegm, NP as Nurse Practitioner (Hematology and Oncology)  DIAGNOSIS:  Encounter Diagnosis  Name Primary?  . Malignant neoplasm of upper-outer quadrant of left breast in female, estrogen receptor positive ([place])     SUMMARY OF ONCOLOGIC HISTORY:   Breast cancer of upper-outer quadrant of left female breast ([place]) (Resolved)   05/10/2016 Initial Diagnosis    Left breast biopsy UOQ: IDC with DCIS, grade 1-2, ER 100%, PR 10%, Ki-67 10%, HER-2 negative ratio 1.59      05/10/2016 Mammogram    Left breast: 1.2 cm irregular marginated mass 1:00 position 6 cm from the nipple. Numerous grouped calcifications throughout right breast: Stable compared to prior, Left breast: calcifications stable, T1 cN0 stage IA clinical stage      05/11/2016 Procedure    Genetic testing:negative for mutations within any of 33 genes on the Custom Panel (Breast, Gyn, GI genes plus FLCN due to her personal history of pneumothorax) through [facility]      06/07/2016 Surgery    Left lumpectomy: IDC grade 1, 1.1 cm, DCIS intermediate grade, margins negative, 0/2 lymph nodes negative, T1c N0 stage IA       Malignant neoplasm of upper-outer quadrant of left female breast ([place])   05/10/2016 Mammogram    Left breast: 1.2 cm irregular marginated mass 1:00 position 6 cm from the nipple. Numerous grouped calcifications throughout right breast: Stable compared to prior, Left breast: calcifications stable, T1 cN0 stage IA clinical stage      05/10/2016 Initial Diagnosis    Left breast biopsy UOQ: IDC with DCIS, grade 1-2, ER 100%, PR 10%, Ki-67 10%, HER-2 negative ratio 1.59      05/11/2016 Miscellaneous    Genetic testing:negative for mutations within any of 33 genes on the Custom Panel (Breast, Gyn, GI genes plus FLCN due to her personal history of pneumothorax) through [facility]      05/27/2016 Genetic Testing    Genetic testing was normal, and did not reveal a deleterious mutation in these genes.  Additionally, no variants of uncertain significance ([place]) were found. Genes tested include: APC, ATM, AXIN2, BARD1, BMPR1A, BRCA1, BRCA2, BRIP1, CDH1, CDK4, CDKN2A, CHEK2, EPCAM, FANCC, FLCN, MLH1, MSH2, MSH6, MUTYH, NBN, PALB2, PMS2, POLD1, POLE, PTEN, RAD51C, RAD51D, SCG5/GREM1, SMAD4, STK11, TP53, VHL, and XRCC2        06/07/2016 Surgery    Left lumpectomy: IDC grade 1, 1.1 cm, DCIS intermediate grade, margins negative, 0/2 lymph nodes negative, T1c N0 stage IA      06/19/2016 Oncotype testing    Oncotype DX score 22, intermediate risk, 14% ROR      07/29/2016 - 08/27/2016 Radiation Therapy    Adjuvant radiation therapy (squire): 1. The Left breast was treated to 40.05 Gy in 15 fractions at 2.67 Gy per fraction. 2. The Left breast was boosted to 10 Gy in 5 fractions at 2 Gy per fraction.       09/20/2016 -  Anti-estrogen oral therapy    Anastrozole 1 mg daily       CHIEF COMPLIANT:  Follow-up on anastrozole therapy  INTERVAL HISTORY: Stacy Logan is a 51 year old with  above-mentioned history of left breast cancer treated with lumpectomy and radiation is currently on anastrozole since January 2018. She has hot flashes but otherwise tolerating the treatment fairly well. Her sister was diagnosed with breast cancer and I saw her yesterday. She is living with her sister.  REVIEW OF SYSTEMS:   Constitutional: Denies fevers, chills or abnormal weight loss Eyes: Denies blurriness of vision Ears, nose, mouth, throat, and  face: Denies mucositis or sore throat Respiratory: Denies cough, dyspnea or wheezes Cardiovascular: Denies palpitation, chest discomfort Gastrointestinal:  Denies nausea, heartburn or change in bowel habits Skin: Denies abnormal skin rashes Lymphatics: Denies new lymphadenopathy or easy bruising Neurological:Denies numbness, tingling or new weaknesses Behavioral/Psych: Mood is stable, no new changes  Extremities: No lower extremity edema Breast:  denies any pain or lumps or nodules in either breasts All other systems were reviewed with the patient and are negative.  I have reviewed the past medical history, past surgical history, social history and family history with the patient and they are unchanged from previous note.  ALLERGIES:  is allergic to codeine; lamictal [lamotrigine]; meperidine; meperidine hcl; and morphine.  MEDICATIONS:  Current Outpatient Prescriptions  Medication Sig Dispense Refill  . amitriptyline (ELAVIL) 25 MG tablet Take 1-2 tablets (25-50 mg total) by mouth at bedtime. 180 tablet 3  . anastrozole (ARIMIDEX) 1 MG tablet Take 1 mg by mouth daily.    . baclofen (LIORESAL) 10 MG tablet TAKE 1 TABLET BY MOUTH TWICE A DAY 60 tablet 2  . diazepam (VALIUM) 5 MG tablet TAKE 1 TABLET BY MOUTH EVERY 8 HOURS AS NEEDED FOR MUSCLE SPASMS (Patient taking differently: TAKE 2 TABLETs BY MOUTH EVERY 8 HOURS AS NEEDED FOR MUSCLE SPASMS) 90 tablet 3  . Dimethyl Fumarate (TECFIDERA) 240 MG CPDR Take 1 capsule (240 mg total) by mouth 2 (two) times daily. 180 capsule 3  . gabapentin (NEURONTIN) 300 MG capsule TAKE 1 CAPSULE (300 MG TOTAL) BY MOUTH 3 (THREE) TIMES DAILY. 90 capsule 11  . letrozole (FEMARA) 2.5 MG tablet Take 1 tablet (2.5 mg total) by mouth daily. 90 tablet 3  . Meth-Hyo-M Bl-Na Phos-Ph Sal (URIBEL) 118 MG CAPS Take 1 capsule by mouth 3 (three) times daily as needed (PAIN/UTI).     Marland Kitchen methylphenidate (RITALIN) 20 MG tablet Take 1 tablet (20 mg total) by mouth 2 (two) times  daily with breakfast and lunch. 60 tablet 0  . naproxen (NAPROSYN) 500 MG tablet Take 1 tablet (500 mg total) by mouth 2 (two) times daily with a meal. 60 tablet 5  . OnabotulinumtoxinA (BOTOX IM) Inject into the muscle. Facial to treat MS    . pantoprazole (PROTONIX) 40 MG tablet Take 1 tablet (40 mg total) by mouth daily. 90 tablet 1  . pramoxine-hydrocortisone (PROCTOCREAM-HC) 1-1 % rectal cream Place 1 application rectally 2 (two) times daily as needed for hemorrhoids or itching. 30 g 1  . SUMAtriptan (IMITREX) 100 MG tablet TAKE 1 TAB BY MOUTH EVERY 2 HRS AS NEEDED FOR MIGRAINE. MEAY REPEAT IN 2 HRS IF HEADACHE PERSISTS 10 tablet 3  . valACYclovir (VALTREX) 1000 MG tablet TAKE 2 TABLETS EVERY 12 HOURS X2 DOSES AS NEEDED FOR COLD SORES 30 tablet 0  . venlafaxine XR (EFFEXOR-XR) 75 MG 24 hr capsule Take 1 capsule (75 mg total) by mouth daily with breakfast. 30 capsule 5   Current Facility-Administered Medications  Medication Dose Route Frequency Provider Last Rate Last Dose  . 0.9 %  sodium chloride infusion  500 mL Intravenous  Continuous Pyrtle, Lajuan Lines, MD        PHYSICAL EXAMINATION: ECOG PERFORMANCE STATUS: 1 - Symptomatic but completely ambulatory  Vitals:   07/07/17 1037  BP: 126/84  Pulse: 89  Resp: 20  Temp: 98.4 F (36.9 C)  SpO2: 98%   Filed Weights   07/07/17 1037  Weight: 120 lb 9.6 oz (54.7 kg)    GENERAL:alert, no distress and comfortable SKIN: skin color, texture, turgor are normal, no rashes or significant lesions EYES: normal, Conjunctiva are pink and non-injected, sclera clear OROPHARYNX:no exudate, no erythema and lips, buccal mucosa, and tongue normal  NECK: supple, thyroid normal size, non-tender, without nodularity LYMPH:  no palpable lymphadenopathy in the cervical, axillary or inguinal LUNGS: clear to auscultation and percussion with normal breathing effort HEART: regular rate & rhythm and no murmurs and no lower extremity edema ABDOMEN:abdomen soft,  non-tender and normal bowel sounds MUSCULOSKELETAL:no cyanosis of digits and no clubbing  NEURO: alert & oriented x 3 with fluent speech, no focal motor/sensory deficits EXTREMITIES: No lower extremity edema BREAST: No palpable masses or nodules in either right or left breasts. No palpable axillary supraclavicular or infraclavicular adenopathy no breast tenderness or nipple discharge. (exam performed in the presence of a chaperone)  LABORATORY DATA:  I have reviewed the data as listed   Chemistry      Component Value Date/Time   NA 141 06/03/2016 1500   K 4.1 06/03/2016 1500   CL 105 06/03/2016 1500   CO2 30 06/03/2016 1500   BUN 12 06/03/2016 1500   CREATININE 0.73 06/03/2016 1500   CREATININE 0.55 12/26/2014 1042      Component Value Date/Time   CALCIUM 9.6 06/03/2016 1500   ALKPHOS 75 06/03/2016 1500   AST 21 06/03/2016 1500   ALT 16 06/03/2016 1500   BILITOT 0.7 06/03/2016 1500       Lab Results  Component Value Date   WBC 5.0 07/08/2016   HGB 13.1 07/08/2016   HCT 38.3 07/08/2016   MCV 91 07/08/2016   PLT 183 07/08/2016   NEUTROABS 3.0 07/08/2016    ASSESSMENT & PLAN:  Malignant neoplasm of upper-outer quadrant of left female breast ([place]) Left lumpectomy 06/07/2016: IDC grade 1, 1.1 cm, DCIS intermediate grade, margins negative, 0/2 lymph nodes negative, T1c N0 stage IA  Genetic testing: Negative Oncotype DX score 22, 14% risk of recurrence with tamoxifen alone, intermediate risk Adjuvant radiation therapy 07/29/2016 to 08/27/2016  Treatment plan: Adjuvant antiestrogen therapy with anastrozole 1 mg by mouth daily started January 2018  Anastrozole toxicities: The first 2 weeks patient had mood swings. Since then she has been doing quite well. Denies any hot flashes or myalgias.  Surveillance: Patient will need a mammogram to be set up. She is going to call and make that appointment. Return to clinic in 1 year for follow-up   I spent 25 minutes talking  to the patient of which more than half was spent in counseling and coordination of care.  No orders of the defined types were placed in this encounter.  The patient has a good understanding of the overall plan. she agrees with it. she will call with any problems that may develop before the next visit here.   Rulon Eisenmenger, MD 07/07/17

## 2017-07-07 NOTE — Assessment & Plan Note (Signed)
Left lumpectomy 06/07/2016: IDC grade 1, 1.1 cm, DCIS intermediate grade, margins negative, 0/2 lymph nodes negative, T1c N0 stage IA  Genetic testing: Negative Oncotype DX score 22, 14% risk of recurrence with tamoxifen alone, intermediate risk Adjuvant radiation therapy 07/29/2016 to 08/27/2016  Treatment plan: Adjuvant antiestrogen therapy with anastrozole 1 mg by mouth daily started January 2018  Anastrozole toxicities: The first 2 weeks patient had mood swings. Since then she has been doing quite well. Denies any hot flashes or myalgias.  Return to clinic in 1 year for follow-up

## 2017-07-07 NOTE — Telephone Encounter (Signed)
Gave patient calendar per 10/18 los

## 2017-07-26 DIAGNOSIS — Z803 Family history of malignant neoplasm of breast: Secondary | ICD-10-CM | POA: Diagnosis not present

## 2017-07-26 DIAGNOSIS — Z87891 Personal history of nicotine dependence: Secondary | ICD-10-CM | POA: Diagnosis not present

## 2017-07-26 DIAGNOSIS — E041 Nontoxic single thyroid nodule: Secondary | ICD-10-CM | POA: Diagnosis not present

## 2017-07-26 DIAGNOSIS — C50912 Malignant neoplasm of unspecified site of left female breast: Secondary | ICD-10-CM | POA: Diagnosis not present

## 2017-08-01 ENCOUNTER — Encounter (INDEPENDENT_AMBULATORY_CARE_PROVIDER_SITE_OTHER): Payer: Self-pay

## 2017-08-01 ENCOUNTER — Encounter: Payer: Self-pay | Admitting: Internal Medicine

## 2017-08-01 ENCOUNTER — Ambulatory Visit (INDEPENDENT_AMBULATORY_CARE_PROVIDER_SITE_OTHER): Payer: Medicare Other | Admitting: Internal Medicine

## 2017-08-01 DIAGNOSIS — H04123 Dry eye syndrome of bilateral lacrimal glands: Secondary | ICD-10-CM | POA: Diagnosis not present

## 2017-08-01 DIAGNOSIS — K648 Other hemorrhoids: Secondary | ICD-10-CM | POA: Diagnosis not present

## 2017-08-01 NOTE — Patient Instructions (Addendum)
Your 3rd banding is scheduled for Friday, 09/09/17 at 3:15 pm.  Please purchase Recticare over the counter and use as needed for rectal pain. Call our office if this is not helping.  If you are age 53 or older, your body mass index should be between 23-30. Your Body mass index is 17.93 kg/m. If this is out of the aforementioned range listed, please consider follow up with your Primary Care Provider.  If you are age 43 or younger, your body mass index should be between 19-25. Your Body mass index is 17.93 kg/m. If this is out of the aformentioned range listed, please consider follow up with your Primary Care Provider.   HEMORRHOID BANDING PROCEDURE    FOLLOW-UP CARE   1. The procedure you have had should have been relatively painless since the banding of the area involved does not have nerve endings and there is no pain sensation.  The rubber band cuts off the blood supply to the hemorrhoid and the band may fall off as soon as 48 hours after the banding (the band may occasionally be seen in the toilet bowl following a bowel movement). You may notice a temporary feeling of fullness in the rectum which should respond adequately to plain Tylenol or Motrin.  2. Following the banding, avoid strenuous exercise that evening and resume full activity the next day.  A sitz bath (soaking in a warm tub) or bidet is soothing, and can be useful for cleansing the area after bowel movements.     3. To avoid constipation, take two tablespoons of natural wheat bran, natural oat bran, flax, Benefiber or any over the counter fiber supplement and increase your water intake to 7-8 glasses daily.    4. Unless you have been prescribed anorectal medication, do not put anything inside your rectum for two weeks: No suppositories, enemas, fingers, etc.  5. Occasionally, you may have more bleeding than usual after the banding procedure.  This is often from the untreated hemorrhoids rather than the treated one.  Don't  be concerned if there is a tablespoon or so of blood.  If there is more blood than this, lie flat with your bottom higher than your head and apply an ice pack to the area. If the bleeding does not stop within a half an hour or if you feel faint, call our office at [phone] or go to the emergency room.  6. Problems are not common; however, if there is a substantial amount of bleeding, severe pain, chills, fever or difficulty passing urine (very rare) or other problems, you should call us at [phone] or report to the nearest emergency room.  7. Do not stay seated continuously for more than 2-3 hours for a day or two after the procedure.  Tighten your buttock muscles 10-15 times every two hours and take 10-15 deep breaths every 1-2 hours.  Do not spend more than a few minutes on the toilet if you cannot empty your bowel; instead re-visit the toilet at a later time.

## 2017-08-02 NOTE — Progress Notes (Signed)
Patient is a 51 year old female with a history of adenomatous colon polyps and symptomatic internal hemorrhoids who returns to consider repeat hemorrhoidal banding Initial hemorrhoidal banding was performed on 05/25/2017 to the left lateral internal hemorrhoid Recently she has had swelling and inflamed hemorrhoid which has been sore for the last 48 hours.  Some intermittent scant bleeding and ongoing prolapse   PROCEDURE NOTE:  The patient presents with symptomatic grade 2-3 internal hemorrhoids, requesting rubber band ligation of her hemorrhoidal disease.  All risks, benefits and alternative forms of therapy were described and informed consent was obtained.   The anorectum was pre-medicated with 0.125% nitroglycerin ointment and RectiCare The exam revealed a thrombosed internal versus external hemorrhoid in the right anterior position. The decision was made to band the RA internal hemorrhoid, and the Buckhorn was used to perform band ligation without complication.   Digital anorectal examination was then performed to assure proper positioning of the band, and to adjust the banded tissue as required.  The patient was discharged home without pain or other issues.  Dietary and behavioral recommendations were given and along with follow-up instructions.     The following adjunctive treatments were recommended: Sitz baths Contact me if perianal pain from thrombosed hemorrhoids does not improve The patient will return as scheduled for  follow-up and possible additional banding as required. No complications were encountered and the patient tolerated the procedure well.

## 2017-08-04 ENCOUNTER — Telehealth: Payer: Self-pay | Admitting: Neurology

## 2017-08-04 NOTE — Telephone Encounter (Signed)
Matt/[facility] [phone] called to advise the pt has missed 8 doses of tecfidera due to change in insurance. He said there is a shipment being sent out to her tomorrow. He said pt will be calling also to make clinic aware and give the update insurance information.  FYI

## 2017-08-04 NOTE — Telephone Encounter (Signed)
Noted/fim 

## 2017-08-10 DIAGNOSIS — M7701 Medial epicondylitis, right elbow: Secondary | ICD-10-CM | POA: Diagnosis not present

## 2017-08-10 DIAGNOSIS — M25521 Pain in right elbow: Secondary | ICD-10-CM | POA: Diagnosis not present

## 2017-09-08 ENCOUNTER — Ambulatory Visit: Payer: Medicare Other | Admitting: Neurology

## 2017-09-09 ENCOUNTER — Encounter: Payer: Medicare Other | Admitting: Internal Medicine

## 2017-09-15 ENCOUNTER — Other Ambulatory Visit: Payer: Self-pay | Admitting: Hematology and Oncology

## 2017-09-22 DIAGNOSIS — R300 Dysuria: Secondary | ICD-10-CM | POA: Diagnosis not present

## 2017-09-22 DIAGNOSIS — R102 Pelvic and perineal pain: Secondary | ICD-10-CM | POA: Diagnosis not present

## 2017-09-22 DIAGNOSIS — R3914 Feeling of incomplete bladder emptying: Secondary | ICD-10-CM | POA: Diagnosis not present

## 2017-09-26 DIAGNOSIS — L818 Other specified disorders of pigmentation: Secondary | ICD-10-CM | POA: Diagnosis not present

## 2017-09-26 DIAGNOSIS — D225 Melanocytic nevi of trunk: Secondary | ICD-10-CM | POA: Diagnosis not present

## 2017-09-26 DIAGNOSIS — L814 Other melanin hyperpigmentation: Secondary | ICD-10-CM | POA: Diagnosis not present

## 2017-09-26 DIAGNOSIS — L700 Acne vulgaris: Secondary | ICD-10-CM | POA: Diagnosis not present

## 2017-09-26 DIAGNOSIS — B070 Plantar wart: Secondary | ICD-10-CM | POA: Diagnosis not present

## 2017-09-26 DIAGNOSIS — Z1283 Encounter for screening for malignant neoplasm of skin: Secondary | ICD-10-CM | POA: Diagnosis not present

## 2017-09-27 DIAGNOSIS — R3914 Feeling of incomplete bladder emptying: Secondary | ICD-10-CM | POA: Diagnosis not present

## 2017-09-29 DIAGNOSIS — R102 Pelvic and perineal pain: Secondary | ICD-10-CM | POA: Diagnosis not present

## 2017-09-29 DIAGNOSIS — R3914 Feeling of incomplete bladder emptying: Secondary | ICD-10-CM | POA: Diagnosis not present

## 2017-09-29 DIAGNOSIS — R300 Dysuria: Secondary | ICD-10-CM | POA: Diagnosis not present

## 2017-10-11 DIAGNOSIS — R3914 Feeling of incomplete bladder emptying: Secondary | ICD-10-CM | POA: Diagnosis not present

## 2017-10-11 DIAGNOSIS — N3281 Overactive bladder: Secondary | ICD-10-CM | POA: Diagnosis not present

## 2017-10-21 ENCOUNTER — Other Ambulatory Visit: Payer: Self-pay | Admitting: Physician Assistant

## 2017-10-21 DIAGNOSIS — B9689 Other specified bacterial agents as the cause of diseases classified elsewhere: Secondary | ICD-10-CM

## 2017-10-21 DIAGNOSIS — J208 Acute bronchitis due to other specified organisms: Principal | ICD-10-CM

## 2017-10-26 ENCOUNTER — Ambulatory Visit (INDEPENDENT_AMBULATORY_CARE_PROVIDER_SITE_OTHER): Payer: Medicare Other | Admitting: Neurology

## 2017-10-26 ENCOUNTER — Other Ambulatory Visit: Payer: Self-pay

## 2017-10-26 ENCOUNTER — Telehealth: Payer: Self-pay | Admitting: Neurology

## 2017-10-26 ENCOUNTER — Encounter: Payer: Self-pay | Admitting: Neurology

## 2017-10-26 VITALS — BP 100/66 | HR 68 | Resp 18 | Ht 69.5 in | Wt 121.0 lb

## 2017-10-26 DIAGNOSIS — C50412 Malignant neoplasm of upper-outer quadrant of left female breast: Secondary | ICD-10-CM | POA: Diagnosis not present

## 2017-10-26 DIAGNOSIS — N3941 Urge incontinence: Secondary | ICD-10-CM

## 2017-10-26 DIAGNOSIS — R269 Unspecified abnormalities of gait and mobility: Secondary | ICD-10-CM | POA: Diagnosis not present

## 2017-10-26 DIAGNOSIS — G35 Multiple sclerosis: Secondary | ICD-10-CM | POA: Diagnosis not present

## 2017-10-26 DIAGNOSIS — G5139 Clonic hemifacial spasm, unspecified: Secondary | ICD-10-CM

## 2017-10-26 DIAGNOSIS — Z170 Estrogen receptor positive status [ER+]: Secondary | ICD-10-CM

## 2017-10-26 DIAGNOSIS — G5133 Clonic hemifacial spasm, bilateral: Secondary | ICD-10-CM | POA: Diagnosis not present

## 2017-10-26 DIAGNOSIS — R5383 Other fatigue: Secondary | ICD-10-CM | POA: Diagnosis not present

## 2017-10-26 MED ORDER — METHYLPHENIDATE HCL 20 MG PO TABS: 20.0000 mg | ORAL_TABLET | Freq: Two times a day (BID) | ORAL | 0 refills | Status: DC

## 2017-10-26 NOTE — Telephone Encounter (Signed)
Please call pt to schedule 3 month BOTOX appt. Thank you.

## 2017-10-26 NOTE — Progress Notes (Signed)
[facility]  PATIENT: Stacy Logan DOB: [birth year]  REFERRING DOCTOR OR PCP:  Annye Asa SOURCE: patient, EMR records from [facility].  MRI images on PACS  _________________________________   HISTORICAL  CHIEF COMPLAINT:  Chief Complaint  Patient presents with  . Multiple Sclerosis    Botox 100u, one vial. [facility] Lot# [id number]. Exp. 03/2020. [place] [phone]  Now seeing [facility] for bladder problems, had cath study, on Myrbetriq, Uribel/fim  . Hemifacial Spasms  . Migraines    HISTORY OF PRESENT ILLNESS:  Stacy Logan is a 52 yo woman with multiple sclerosis who also has right hemifacial spasms/bilateral blepharospasm (R>L).   She also has migraine headaches      Update 10/26/2017: Her hemifacial spasms are doing better with Botox, she did have a little eyelid drooping on the right, though.   Spasms started to return in about 2 weeks ago.  She also has migraines a few times a week now a used to have daily headaches. For a couple months after Botox the migraines only occur once or twice a week..   With migraines she will get nausea and photophobia and phonophobia.     She is on Tecfidera and tolerating it well.  He felt bladder function only worsened several months ago. She had incontinence and saw urology.     She is on Myrbetriq.      She feels more fatigue and is sleeping poorly.   Fatigue occurs daily. Ritalin has helped a little bit. She notes some depression and anxiety. She has mild cognitive issues with short-term memory and word finding at times.  She is done with the treatments for her breast cancer.    From 06/09/2017: Hemifacial spasms/blepharospasm:    She feels that the right hemifacial spasms in her blepharospasm improved tremendously after the last Botox therapy and the spasms have continued to do well the entire 3 months. Did not note any eyelid drooping with the last treatment course.   She tolerated it well and  there were no complications. She does not note any difficulty with swallowing.  Migraine/Neck pain:  Her migraines also improved after the Botox.. At times the neck pain radiating to the right arm.Marland Kitchen EMG showed mild chronic C7 radiculopathy in the past.   Both hands have numbness.  MS:  She has no definite exacerbation though feels worse with more fatigue.  She is remaining on Tecfidera and tolerates it well. She has not had any definite exacerbations since starting the medication.    Gait/strength/sensation/pain:  She fractured her left shoulder when she lost balance and fell.   She also broke her big toe during the fall.   The left leg does not have as much coordination as the right.   She also has a tight dysesthetic sensation in both feet and ankles and the arms have spasms.   Baclofen has helped the spasticity. However, makes her sleepy which limits the dose. She just takes at bedtime. Lamotrigine and gabapentin did not help.   Bladder:   She has urinary frequency and urgency with occasional incontinence. This is mostly stable.  Vision/eyes:   She feels her vision is stable with no new blurriness and no changes in color vision. No eye pain..  Fatigue/sleep:  She has a lot of fatigue much worse with heat.    Ritalin has only helped a little bit.    Mood/cognition:   She notes more depression with all the medical issues.   She  is now on a higher dose of Effexor (150 mg daily. She also notes cognitive issues and poor focus.   Specifically, organizing is difficult.   She is forgetful and easily distracted but this is stable..   She has trouble following through with tasks.   Ritalin helps a little bit  Breast Ca:   She was diagnosed with breast cancer and had surgery and now is don with RadRx and chemo   She feels much more tired since her diagnosis..     MS/spine history:   In 2001, she was noted to have left hearing loss and had an MRI of the brain.   She did not have numbness or weakness a that  time.  She was told she likely had MS.   She saw a neurologist.  She never had an LP.   She reports she was told she had MS but was never started on any medication.   Starting about 5-6 years ago, she was noting more difficulty with gait and also noted some memory issues.   She was also having bladder issues with urgency and bladder incontinence x 5 years.      However, she had no health insurance at that time and did not follow up with neurology.   She saw Dr. Tomi Likens last year.   He ordered an MRI of the brian and cervical spine.   She had one enhancing lesion on the spine and one additional lesion.  An LP was ordered but she opted not to do the test.   She was started on tecfidera.   She has had some itching and flushing, especially if she does not take after food.     She also was found to have severe spinal stenosis and myelopathy.   She was sent to Dr. Arnoldo Morale of Neurosurgery.   She underwent  ACDF from C3-C7 06/2014.    She reports she is having more trouble with her arms since the surgery.   The worse pain is in the 2nd and 3rd fingers but she has altered sensation with some pain in the other fingers as well.   She also has right > left lower neck pain.  Bladder is about the same (maybe slightly better) as it was before surgery.      I have personally reviewed the MRI of the brain dated 01/12/2015 and the MRIs of the cervical spine dated 02/01/2015 and 05/22/2014.   The MRI of the brain shows white matter foci predominantly in the deep white matter but also in the periventricular and subcortical white matter of both hemispheres. The brainstem appears normal. There is no significant atrophy. The MRI of the cervical spine from 2015 showed severe spinal stenosis at C5-C6 and milder spinal stenosis at C6-C7 and C4-C5. There is an enhancing focus within the spinal cord adjacent to C5-C6 and she has another hyperintense focus at C3-C4. On the second MRI, done without contrast, she is status post C3-C7 ACDF.       REVIEW OF SYSTEMS: Constitutional: No fevers, chills, sweats, or change in appetite Eyes: as above Ear, nose and throat: No hearing loss, ear pain, nasal congestion, sore throat Cardiovascular: No chest pain, palpitations Respiratory: No shortness of breath at rest or with exertion.   No wheezes GastrointestinaI: No nausea, vomiting, diarrhea, abdominal pain, fecal incontinence Genitourinary: see above Musculoskeletal: Some neck pain, back pain Integumentary: No rash, pruritus, skin lesions Neurological: as above Psychiatric: Notes depression and anxiety Endocrine: No palpitations, diaphoresis, change in  appetite, change in weigh or increased thirst Hematologic/Lymphatic: No anemia, purpura, petechiae. Allergic/Immunologic: No itchy/runny eyes, nasal congestion, recent allergic reactions, rashes  ALLERGIES: Allergies  Allergen Reactions  . Codeine     Makes her "busy", itchy  . Lamictal [Lamotrigine] Other (See Comments)    Mood changes   . Meperidine Nausea And Vomiting  . Meperidine Hcl Nausea And Vomiting    SEVERE N/V  . Morphine Other (See Comments)    "SKIN CRAWLS"    HOME MEDICATIONS:  Current Outpatient Medications:  .  amitriptyline (ELAVIL) 25 MG tablet, Take 1-2 tablets (25-50 mg total) by mouth at bedtime., Disp: 180 tablet, Rfl: 3 .  anastrozole (ARIMIDEX) 1 MG tablet, Take 1 mg by mouth daily., Disp: , Rfl:  .  baclofen (LIORESAL) 10 MG tablet, TAKE 1 TABLET BY MOUTH TWICE A DAY, Disp: 60 tablet, Rfl: 2 .  diazepam (VALIUM) 5 MG tablet, TAKE 1 TABLET BY MOUTH EVERY 8 HOURS AS NEEDED FOR MUSCLE SPASMS (Patient taking differently: TAKE 2 TABLETs BY MOUTH EVERY 8 HOURS AS NEEDED FOR MUSCLE SPASMS), Disp: 90 tablet, Rfl: 3 .  Dimethyl Fumarate (TECFIDERA) 240 MG CPDR, Take 1 capsule (240 mg total) by mouth 2 (two) times daily., Disp: 180 capsule, Rfl: 3 .  gabapentin (NEURONTIN) 300 MG capsule, TAKE 1 CAPSULE (300 MG TOTAL) BY MOUTH 3 (THREE) TIMES DAILY.,  Disp: 90 capsule, Rfl: 11 .  letrozole (FEMARA) 2.5 MG tablet, TAKE 1 TABLET BY MOUTH EVERY DAY, Disp: 90 tablet, Rfl: 3 .  Meth-Hyo-M Bl-Na Phos-Ph Sal (URIBEL) 118 MG CAPS, Take 1 capsule by mouth 3 (three) times daily as needed (PAIN/UTI). , Disp: , Rfl:  .  methylphenidate (RITALIN) 20 MG tablet, Take 1 tablet (20 mg total) by mouth 2 (two) times daily with breakfast and lunch., Disp: 60 tablet, Rfl: 0 .  mirabegron ER (MYRBETRIQ) 50 MG TB24 tablet, Take 50 mg by mouth daily., Disp: , Rfl:  .  naproxen (NAPROSYN) 500 MG tablet, Take 1 tablet (500 mg total) by mouth 2 (two) times daily with a meal., Disp: 60 tablet, Rfl: 5 .  OnabotulinumtoxinA (BOTOX IM), Inject into the muscle. Facial to treat MS, Disp: , Rfl:  .  pramoxine-hydrocortisone (PROCTOCREAM-HC) 1-1 % rectal cream, Place 1 application rectally 2 (two) times daily as needed for hemorrhoids or itching., Disp: 30 g, Rfl: 1 .  SUMAtriptan (IMITREX) 100 MG tablet, TAKE 1 TAB BY MOUTH EVERY 2 HRS AS NEEDED FOR MIGRAINE. MEAY REPEAT IN 2 HRS IF HEADACHE PERSISTS, Disp: 10 tablet, Rfl: 1 .  valACYclovir (VALTREX) 1000 MG tablet, TAKE 2 TABLETS EVERY 12 HOURS X2 DOSES AS NEEDED FOR COLD SORES, Disp: 30 tablet, Rfl: 0 .  venlafaxine XR (EFFEXOR-XR) 75 MG 24 hr capsule, Take 1 capsule (75 mg total) by mouth daily with breakfast., Disp: 30 capsule, Rfl: 5 .  pantoprazole (PROTONIX) 40 MG tablet, Take 1 tablet (40 mg total) by mouth daily. (Patient not taking: Reported on 10/26/2017), Disp: 90 tablet, Rfl: 1  Current Facility-Administered Medications:  .  0.9 %  sodium chloride infusion, 500 mL, Intravenous, Continuous, Pyrtle, Lajuan Lines, MD  PAST MEDICAL HISTORY: Past Medical History:  Diagnosis Date  . Anal fissure   . Anxiety   . Arthritis   . Borderline diabetes   . Cancer [facility])    left breast cancer  . Depression   . Frequency of urination   . H/O cold sores   . History of pneumothorax    1988-- SPONTANEOUS--  RESOLVED W/ CHEST TUBE  .  History of radiation therapy 07/29/16- 08/27/16   Left Breast 40.05 Gy in 15 fractions, Left Breast Boost 10 Gy in 5 fractions.   . IC (interstitial cystitis)   . Internal hemorrhoids   . Lesion of bladder   . Migraines   . MS (multiple sclerosis) ([place])    MRI showed plague on her brain  . Nocturia   . Preeclampsia 1994  . RSD (reflex sympathetic dystrophy)   . Seasonal allergies   . Tubular adenoma of colon   . Urgency of urination     PAST SURGICAL HISTORY: Past Surgical History:  Procedure Laterality Date  . ANTERIOR CERVICAL DECOMPRESSION/DISCECTOMY FUSION 4 LEVELS N/A 07/03/2014   Procedure: ANTERIOR CERVICAL DECOMPRESSION/DISCECTOMY FUSION 4 LEVELS;  Surgeon: Newman Pies, MD;  Location: [place] [facility];  Service: Neurosurgery;  Laterality: N/A;  C3-4 C4-5 C5-6 C6-7 Anterior cervical decompression/diskectomy/fusion/interbody prosthesis/plate  . APPENDECTOMY    . CESAREAN SECTION  1994  . CYSTOSCOPY WITH HYDRODISTENSION AND BIOPSY Bilateral 02/15/2014   Procedure: CYSTOSCOPY  BILATERAL RETROGRADE PYLOGRAM, HYDRODISTENSION, INSTILATION OF MARCAINE AND PYRIDIUM;  Surgeon: Ardis Hughs, MD;  [facility];  Service: Urology;  Laterality: Bilateral;  . D & C HYSTEROSCOPY WITH POLYPECTOMY  12-02-2003  . DILATION AND CURETTAGE OF UTERUS    . DX LAPAROSCOPY/  FULGERATION ENDOMETRIOSIS/  APPENDECTOMY  1985  . RADIOACTIVE SEED GUIDED PARTIAL MASTECTOMY WITH AXILLARY SENTINEL LYMPH NODE BIOPSY Left 06/07/2016   Procedure: BREAST LUMPECTOMY WITH RADIOACTIVE SEED AND SENTINEL LYMPH NODE BIOPSY, INJECT BLUE DYE LEFT BREAST;  Surgeon: Fanny Skates, MD;  Location: [place];  Service: General;  Laterality: Left;  . TONSILLECTOMY AND ADENOIDECTOMY  1972    FAMILY HISTORY: Family History  Problem Relation Age of Onset  . Hypertension Mother   . Diabetes Father   . Prostate cancer Father        dx late 40s  . Lung cancer Father 17       smoker    . Cancer Father        cheek of mouth, dx. between 82 and 82  . Diabetes Paternal Grandmother   . Diabetes Paternal Grandfather   . Aneurysm Maternal Grandmother 76       d. brain aneurysm  . Prostate cancer Maternal Grandfather   . Diabetes Paternal Uncle        x 4  . Diabetes Paternal Aunt   . Colon cancer Maternal Uncle   . Cancer Maternal Uncle        mouth cancer; +tobacco  . Breast cancer Sister 76       s/p mastectomy  . Breast cancer Maternal Aunt        dx 32s  . Lymphoma Maternal Aunt 83  . Uterine cancer Maternal Aunt        d. late 52s  . Throat cancer Cousin        maternal 1st cousin; lim info  . Cancer Paternal Uncle        NOS cancer  . Breast cancer Cousin        paternal 1st cousin dx 79s  . Esophageal cancer Neg Hx   . Rectal cancer Neg Hx   . Stomach cancer Neg Hx     SOCIAL HISTORY:  Social History   Socioeconomic History  . Marital status: Single    Spouse name: Not on file  . Number of children: 1  . Years of education:  Not on file  . Highest education level: Not on file  Social Needs  . Financial resource strain: Not on file  . Food insecurity - worry: Not on file  . Food insecurity - inability: Not on file  . Transportation needs - medical: Not on file  . Transportation needs - non-medical: Not on file  Occupational History  . Occupation: disabled  Tobacco Use  . Smoking status: Former Smoker    Packs/day: 0.25    Years: 27.00    Pack years: 6.75    Types: Cigarettes    Last attempt to quit: 01/17/2011    Years since quitting: 6.7  . Smokeless tobacco: Never Used  Substance and Sexual Activity  . Alcohol use: No  . Drug use: No  . Sexual activity: No  Other Topics Concern  . Not on file  Social History Narrative  . Not on file     PHYSICAL EXAM  Vitals:   10/26/17 1412  BP: 100/66  Pulse: 68  Resp: 18  Weight: 121 lb (54.9 kg)  Height: 5' 9.5" (1.765 m)    Body mass index is 17.61 kg/m.   General: The  patient is a thin woman in no acute distress   Neurologic Exam  Mental status: The patient is alert and oriented x 3 at the time of the examination. The patient has apparent normal recent and remote memory, with an apparently normal attention span and concentration ability.   Speech is normal.  Cranial nerves:  Marland Kitchen There were some right hemifacial spasms and mild right ptosis.  Patient strength and sensation is normal.  Trapezius and sternocleidomastoid strength is normal. No dysarthria is noted.  The tongue is midline, and the patient has symmetric elevation of the soft palate. No obvious hearing deficits are noted.  Motor:  Muscle bulk is normal.   Tone is normal. Strength is mildly reduced in the right triceps and intrinsic right hand muscles. Strength is normal in the legs.     Sensory: She has decreased sensation in the right arm.   '  Gait and station: Station is normal.   Gait is normal. Tandem gait is wide. Romberg is negative.   Reflexes: Deep tendon reflexes are increased in both legs with spread at the knees. There is no ankle clonus.Marland Kitchen       DIAGNOSTIC DATA (LABS, IMAGING, TESTING) - I reviewed patient records, labs, notes, testing and imaging myself where available.  Lab Results  Component Value Date   WBC 5.0 07/08/2016   HGB 13.1 07/08/2016   HCT 38.3 07/08/2016   MCV 91 07/08/2016   PLT 183 07/08/2016      Component Value Date/Time   NA 141 06/03/2016 1500   K 4.1 06/03/2016 1500   CL 105 06/03/2016 1500   CO2 30 06/03/2016 1500   GLUCOSE 94 06/03/2016 1500   BUN 12 06/03/2016 1500   CREATININE 0.73 06/03/2016 1500   CREATININE 0.55 12/26/2014 1042   CALCIUM 9.6 06/03/2016 1500   PROT 7.1 06/03/2016 1500   ALBUMIN 4.1 06/03/2016 1500   AST 21 06/03/2016 1500   ALT 16 06/03/2016 1500   ALKPHOS 75 06/03/2016 1500   BILITOT 0.7 06/03/2016 1500   GFRNONAA >60 06/03/2016 1500   GFRNONAA >89 12/26/2014 1042   GFRAA >60 06/03/2016 1500   GFRAA >89 12/26/2014  1042     __________________________________________________  BOTOX INJECTION  The risks and benefits of Botox injection brain to Mrs. Dattilio. We discussed the possibility of ptosis  as the most common side effect with injection for blepharospasm and hemifacial spasm.   The following injections were performed using sterile technique.  Right frontalis (2.5 units 2) Left frontalis (2.5 units 2) Procerus and corrugators (2.5 units 3) Orbicularis oculi (1.25 units 2 on the right upper and 1.25 units 1 on the right lower and 2.5 units upper on the left and 2.5 units lower on the left) Lateral canthus (2.5 units on each side)                  Right zygomaticus (5 units x 2 ) Buccinator (5 units x 2)    Occipitalis (2.5 U x 4)    60 U  Splenius capitis (10 units 2) C3 paraspinal (5 units 2) C7 paraspinal (5 units 2)   40 U     Total 100 Units    ASSESSMENT AND PLAN  Multiple sclerosis ([place]) - Plan: CBC with Differential/Platelet  Hemifacial spasm  Other fatigue  Urge incontinence  Malignant neoplasm of upper-outer quadrant of left breast in female, estrogen receptor positive (HCC)  Gait disturbance   1.   Botox (100 units) was injected into the muscles of the face and neck as described above for blepharospasm and hemifacial spasm and migraine 2.   Tecfidera for MS.   3.   Renew ritalin 20 mg 2 daily hypersomnia and MS fatigue 4.   She will return in 3 months or sooner if there are new or worsening neurologic symptoms.    Richard A. Felecia Shelling, MD, PhD 05/25/2840, 3:24 PM Certified in Neurology, Clinical Neurophysiology, Sleep Medicine, Pain Medicine and Neuroimaging  [facility] [address], [place], [place] [address] [phone]

## 2017-10-27 LAB — CBC WITH DIFFERENTIAL/PLATELET
BASOS ABS: 0 10*3/uL (ref 0.0–0.2)
Basos: 0 %
EOS (ABSOLUTE): 0.1 10*3/uL (ref 0.0–0.4)
EOS: 1 %
HEMATOCRIT: 36.9 % (ref 34.0–46.6)
HEMOGLOBIN: 12.6 g/dL (ref 11.1–15.9)
IMMATURE GRANULOCYTES: 0 %
Immature Grans (Abs): 0 10*3/uL (ref 0.0–0.1)
LYMPHS: 12 %
Lymphocytes Absolute: 1 10*3/uL (ref 0.7–3.1)
MCH: 30.1 pg (ref 26.6–33.0)
MCHC: 34.1 g/dL (ref 31.5–35.7)
MCV: 88 fL (ref 79–97)
MONOCYTES: 6 %
Monocytes Absolute: 0.5 10*3/uL (ref 0.1–0.9)
NEUTROS PCT: 81 %
Neutrophils Absolute: 6.7 10*3/uL (ref 1.4–7.0)
Platelets: 211 10*3/uL (ref 150–379)
RBC: 4.18 x10E6/uL (ref 3.77–5.28)
RDW: 15.1 % (ref 12.3–15.4)
WBC: 8.3 10*3/uL (ref 3.4–10.8)

## 2017-10-28 ENCOUNTER — Telehealth: Payer: Self-pay | Admitting: *Deleted

## 2017-10-28 NOTE — Telephone Encounter (Signed)
Spoke with Stacy Logan and per RAS, advised lab work done in our office is fine.  She verbalized understanding of same/fim

## 2017-10-28 NOTE — Telephone Encounter (Signed)
-----   Message from Britt Bottom, MD sent at 10/27/2017  4:42 PM EST ----- Please let the patient know that the lab work is fine.

## 2017-10-31 ENCOUNTER — Ambulatory Visit
Admission: RE | Admit: 2017-10-31 | Discharge: 2017-10-31 | Disposition: A | Discharge status: Home / Self Care | Payer: Medicare Other | Source: Ambulatory Visit | Attending: Adult Health | Admitting: Adult Health

## 2017-10-31 DIAGNOSIS — R922 Inconclusive mammogram: Secondary | ICD-10-CM | POA: Diagnosis not present

## 2017-10-31 DIAGNOSIS — C50412 Malignant neoplasm of upper-outer quadrant of left female breast: Secondary | ICD-10-CM

## 2017-10-31 DIAGNOSIS — Z780 Asymptomatic menopausal state: Secondary | ICD-10-CM | POA: Diagnosis not present

## 2017-10-31 DIAGNOSIS — E2839 Other primary ovarian failure: Secondary | ICD-10-CM

## 2017-10-31 DIAGNOSIS — Z170 Estrogen receptor positive status [ER+]: Secondary | ICD-10-CM

## 2017-10-31 DIAGNOSIS — M8589 Other specified disorders of bone density and structure, multiple sites: Secondary | ICD-10-CM | POA: Diagnosis not present

## 2017-10-31 HISTORY — DX: Personal history of irradiation: Z92.3

## 2017-11-03 NOTE — Telephone Encounter (Signed)
I called and scheduled the patient for her next injection.  °

## 2017-11-07 ENCOUNTER — Other Ambulatory Visit: Payer: Self-pay | Admitting: General Surgery

## 2017-11-07 DIAGNOSIS — E041 Nontoxic single thyroid nodule: Secondary | ICD-10-CM

## 2017-11-08 ENCOUNTER — Other Ambulatory Visit: Payer: Self-pay | Admitting: Adult Health

## 2017-11-08 ENCOUNTER — Telehealth: Payer: Self-pay | Admitting: Adult Health

## 2017-11-08 DIAGNOSIS — M85852 Other specified disorders of bone density and structure, left thigh: Secondary | ICD-10-CM

## 2017-11-08 MED ORDER — ALENDRONATE SODIUM 70 MG PO TABS: 70.0000 mg | ORAL_TABLET | ORAL | 2 refills | Status: DC

## 2017-11-08 NOTE — Telephone Encounter (Signed)
Called patient about her bone density results.  Reviewed with her that she is taking aromatase inhibitor and her bone density was consistent with osteopenia with a t score of -2.0.  Reviewed the recommendation of calcium, vitamin d, and weight bearing exercises.  Reviewed with patient Dr. Geralyn Flash recommendation of considering bisphosphanate therapy such as Fosamax at this point. Reviewed risks and benefits in detail with patient.  She would like fosamax to be sent into her pharmacy.  I counseled her to take with a  Full glass of water and remain upright for about 30 minutes to one hour after taking.  I counseled her that she will require repeat bone density in two years.  She verbalizes understanding.  She knows to call if she has any issues whatsoever with the medication.  She states she really wants to be on something, because her sister has had bilateral hip replacements and she is concerned that she may break her hip if she falls too.    Stacy Bihari, NP

## 2017-11-14 ENCOUNTER — Ambulatory Visit
Admission: RE | Admit: 2017-11-14 | Discharge: 2017-11-14 | Disposition: A | Discharge status: Home / Self Care | Payer: Medicare Other | Source: Ambulatory Visit | Attending: General Surgery | Admitting: General Surgery

## 2017-11-14 DIAGNOSIS — E041 Nontoxic single thyroid nodule: Secondary | ICD-10-CM | POA: Diagnosis not present

## 2017-11-20 ENCOUNTER — Other Ambulatory Visit: Payer: Self-pay | Admitting: Neurology

## 2017-11-22 ENCOUNTER — Other Ambulatory Visit: Payer: Self-pay | Admitting: Neurology

## 2017-11-27 ENCOUNTER — Other Ambulatory Visit: Payer: Self-pay | Admitting: Neurology

## 2017-12-12 DIAGNOSIS — Z01419 Encounter for gynecological examination (general) (routine) without abnormal findings: Secondary | ICD-10-CM | POA: Diagnosis not present

## 2017-12-12 DIAGNOSIS — Z779 Other contact with and (suspected) exposures hazardous to health: Secondary | ICD-10-CM | POA: Diagnosis not present

## 2017-12-20 ENCOUNTER — Other Ambulatory Visit: Payer: Self-pay | Admitting: Neurology

## 2017-12-22 DIAGNOSIS — N950 Postmenopausal bleeding: Secondary | ICD-10-CM | POA: Diagnosis not present

## 2017-12-22 DIAGNOSIS — R87610 Atypical squamous cells of undetermined significance on cytologic smear of cervix (ASC-US): Secondary | ICD-10-CM | POA: Diagnosis not present

## 2018-01-11 ENCOUNTER — Other Ambulatory Visit: Payer: Self-pay | Admitting: Neurology

## 2018-01-11 ENCOUNTER — Telehealth: Payer: Self-pay | Admitting: Neurology

## 2018-01-11 NOTE — Telephone Encounter (Signed)
botox r/s from 5/9

## 2018-01-11 NOTE — Telephone Encounter (Signed)
Patient has been scheduled

## 2018-01-17 NOTE — Telephone Encounter (Signed)
I called in a refill request for patients medication, the pharmacy tried to reach out to the patient but she did not answer and there was no option for VM.

## 2018-01-26 ENCOUNTER — Ambulatory Visit: Payer: Medicare Other | Admitting: Neurology

## 2018-01-30 ENCOUNTER — Encounter: Payer: Self-pay | Admitting: Neurology

## 2018-01-30 ENCOUNTER — Telehealth: Payer: Self-pay | Admitting: Neurology

## 2018-01-30 ENCOUNTER — Ambulatory Visit (INDEPENDENT_AMBULATORY_CARE_PROVIDER_SITE_OTHER): Payer: Medicare Other | Admitting: Neurology

## 2018-01-30 ENCOUNTER — Other Ambulatory Visit: Payer: Self-pay

## 2018-01-30 VITALS — BP 133/85 | HR 111 | Resp 18 | Ht 69.5 in | Wt 129.0 lb

## 2018-01-30 DIAGNOSIS — G43709 Chronic migraine without aura, not intractable, without status migrainosus: Secondary | ICD-10-CM

## 2018-01-30 DIAGNOSIS — G5139 Clonic hemifacial spasm, unspecified: Secondary | ICD-10-CM

## 2018-01-30 DIAGNOSIS — Z170 Estrogen receptor positive status [ER+]: Secondary | ICD-10-CM

## 2018-01-30 DIAGNOSIS — G245 Blepharospasm: Secondary | ICD-10-CM | POA: Diagnosis not present

## 2018-01-30 DIAGNOSIS — G35 Multiple sclerosis: Secondary | ICD-10-CM

## 2018-01-30 DIAGNOSIS — F418 Other specified anxiety disorders: Secondary | ICD-10-CM

## 2018-01-30 DIAGNOSIS — R208 Other disturbances of skin sensation: Secondary | ICD-10-CM

## 2018-01-30 DIAGNOSIS — IMO0002 Reserved for concepts with insufficient information to code with codable children: Secondary | ICD-10-CM

## 2018-01-30 DIAGNOSIS — C50412 Malignant neoplasm of upper-outer quadrant of left female breast: Secondary | ICD-10-CM

## 2018-01-30 MED ORDER — DIAZEPAM 5 MG PO TABS: ORAL_TABLET | ORAL | 3 refills | Status: DC

## 2018-01-30 MED ORDER — METHYLPHENIDATE HCL 20 MG PO TABS: 20.0000 mg | ORAL_TABLET | Freq: Two times a day (BID) | ORAL | 0 refills | Status: DC

## 2018-01-30 NOTE — Progress Notes (Signed)
[facility]  PATIENT: Stacy Logan DOB: 09/23/1965  REFERRING DOCTOR OR PCP:  Annye Asa SOURCE: patient, EMR records from [facility].  MRI images on PACS  _________________________________   HISTORICAL  CHIEF COMPLAINT:  Chief Complaint  Patient presents with  . Multiple Sclerosis    Sts. she continues to tolerate Tecfidera well.  C/O new spasm right lower lip, and more facial pain bilat/fim  . Hemifacial Spasms    Botox 100u 1 vial. [facility] Lot# [id number].  Exp. 09/21. NDC 0023-1145-01/fim  . Migraines    HISTORY OF PRESENT ILLNESS:  Stacy Logan is a 52 yo woman with multiple sclerosis who also has right hemifacial spasms/bilateral blepharospasm (R>L).   She also has migraine headaches      Update 01/30/2018: Her hemifacial spasms are doing better with Botox but the last 2 weeks they are starting to return.   She tolerated the last series of injections well and there was no ptosis following the injections as she experienced the first time.   Her migraines have also done better with Botox.   She now only has a few a month instead of almost any day.  She continues to have a lot of pain in her hands.    The worse pain is in her right middle finger.    Her MS is doing well on Tecfidera and she denies any exacerbation.   Gait is doing better and she no longer uses a cane.  Bladder has done better with Myrbetriq.    Mood is doing ok though still mild depression.  She still notes a fair amount of fatigue.  Her breast cancer remains in remission.   Update 10/26/2017: Her hemifacial spasms are doing better with Botox, she did have a little eyelid drooping on the right, though.   Spasms started to return in about 2 weeks ago.  She also has migraines a few times a week now a used to have daily headaches. For a couple months after Botox the migraines only occur once or twice a week..   With migraines she will get nausea and photophobia and  phonophobia.     She is on Tecfidera and tolerating it well.  He felt bladder function only worsened several months ago. She had incontinence and saw urology.     She is on Myrbetriq.      She feels more fatigue and is sleeping poorly.   Fatigue occurs daily. Ritalin has helped a little bit. She notes some depression and anxiety. She has mild cognitive issues with short-term memory and word finding at times.  She is done with the treatments for her breast cancer.    From 06/09/2017: Hemifacial spasms/blepharospasm:    She feels that the right hemifacial spasms in her blepharospasm improved tremendously after the last Botox therapy and the spasms have continued to do well the entire 3 months. Did not note any eyelid drooping with the last treatment course.   She tolerated it well and there were no complications. She does not note any difficulty with swallowing.  Migraine/Neck pain:  Her migraines also improved after the Botox.. At times the neck pain radiating to the right arm.Marland Kitchen EMG showed mild chronic C7 radiculopathy in the past.   Both hands have numbness.  MS:  She has no definite exacerbation though feels worse with more fatigue.  She is remaining on Tecfidera and tolerates it well. She has not had any definite exacerbations since starting the medication.  Gait/strength/sensation/pain:  She fractured her left shoulder when she lost balance and fell.   She also broke her big toe during the fall.   The left leg does not have as much coordination as the right.   She also has a tight dysesthetic sensation in both feet and ankles and the arms have spasms.   Baclofen has helped the spasticity. However, makes her sleepy which limits the dose. She just takes at bedtime. Lamotrigine and gabapentin did not help.   Bladder:   She has urinary frequency and urgency with occasional incontinence. This is mostly stable.  Vision/eyes:   She feels her vision is stable with no new blurriness and no changes in  color vision. No eye pain..  Fatigue/sleep:  She has a lot of fatigue much worse with heat.    Ritalin has only helped a little bit.    Mood/cognition:   She notes more depression with all the medical issues.   She is now on a higher dose of Effexor (150 mg daily. She also notes cognitive issues and poor focus.   Specifically, organizing is difficult.   She is forgetful and easily distracted but this is stable..   She has trouble following through with tasks.   Ritalin helps a little bit  Breast Ca:   She was diagnosed with breast cancer and had surgery and now is don with RadRx and chemo   She feels much more tired since her diagnosis..     MS/spine history:   In 2001, she was noted to have left hearing loss and had an MRI of the brain.   She did not have numbness or weakness a that time.  She was told she likely had MS.   She saw a neurologist.  She never had an LP.   She reports she was told she had MS but was never started on any medication.   Starting about 5-6 years ago, she was noting more difficulty with gait and also noted some memory issues.   She was also having bladder issues with urgency and bladder incontinence x 5 years.      However, she had no health insurance at that time and did not follow up with neurology.   She saw Dr. Tomi Likens last year.   He ordered an MRI of the brian and cervical spine.   She had one enhancing lesion on the spine and one additional lesion.  An LP was ordered but she opted not to do the test.   She was started on tecfidera.   She has had some itching and flushing, especially if she does not take after food.     She also was found to have severe spinal stenosis and myelopathy.   She was sent to Dr. Arnoldo Morale of Neurosurgery.   She underwent  ACDF from C3-C7 06/2014.    She reports she is having more trouble with her arms since the surgery.   The worse pain is in the 2nd and 3rd fingers but she has altered sensation with some pain in the other fingers as well.   She also  has right > left lower neck pain.  Bladder is about the same (maybe slightly better) as it was before surgery.      I have personally reviewed the MRI of the brain dated 01/12/2015 and the MRIs of the cervical spine dated 02/01/2015 and 05/22/2014.   The MRI of the brain shows white matter foci predominantly in the deep white matter but  also in the periventricular and subcortical white matter of both hemispheres. The brainstem appears normal. There is no significant atrophy. The MRI of the cervical spine from 2015 showed severe spinal stenosis at C5-C6 and milder spinal stenosis at C6-C7 and C4-C5. There is an enhancing focus within the spinal cord adjacent to C5-C6 and she has another hyperintense focus at C3-C4. On the second MRI, done without contrast, she is status post C3-C7 ACDF.      REVIEW OF SYSTEMS: Constitutional: No fevers, chills, sweats, or change in appetite Eyes: as above Ear, nose and throat: No hearing loss, ear pain, nasal congestion, sore throat Cardiovascular: No chest pain, palpitations Respiratory: No shortness of breath at rest or with exertion.   No wheezes GastrointestinaI: No nausea, vomiting, diarrhea, abdominal pain, fecal incontinence Genitourinary: see above Musculoskeletal: Some neck pain, back pain Integumentary: No rash, pruritus, skin lesions Neurological: as above Psychiatric: Notes depression and anxiety Endocrine: No palpitations, diaphoresis, change in appetite, change in weigh or increased thirst Hematologic/Lymphatic: No anemia, purpura, petechiae. Allergic/Immunologic: No itchy/runny eyes, nasal congestion, recent allergic reactions, rashes  ALLERGIES: Allergies  Allergen Reactions  . Codeine     Makes her "busy", itchy  . Lamictal [Lamotrigine] Other (See Comments)    Mood changes   . Meperidine Nausea And Vomiting  . Meperidine Hcl Nausea And Vomiting    SEVERE N/V  . Morphine Other (See Comments)    "SKIN CRAWLS"    HOME  MEDICATIONS:  Current Outpatient Medications:  .  anastrozole (ARIMIDEX) 1 MG tablet, Take 1 mg by mouth daily., Disp: , Rfl:  .  baclofen (LIORESAL) 10 MG tablet, TAKE 1 TABLET BY MOUTH TWICE A DAY, Disp: 60 tablet, Rfl: 2 .  diazepam (VALIUM) 5 MG tablet, TAKE 1 TABLET BY MOUTH EVERY 8 HOURS AS NEEDED SPASMS, Disp: 90 tablet, Rfl: 3 .  gabapentin (NEURONTIN) 300 MG capsule, TAKE 1 CAPSULE (300 MG TOTAL) BY MOUTH 3 (THREE) TIMES DAILY., Disp: 90 capsule, Rfl: 11 .  letrozole (FEMARA) 2.5 MG tablet, TAKE 1 TABLET BY MOUTH EVERY DAY, Disp: 90 tablet, Rfl: 3 .  Meth-Hyo-M Bl-Na Phos-Ph Sal (URIBEL) 118 MG CAPS, Take 1 capsule by mouth 3 (three) times daily as needed (PAIN/UTI). , Disp: , Rfl:  .  methylphenidate (RITALIN) 20 MG tablet, Take 1 tablet (20 mg total) by mouth 2 (two) times daily with breakfast and lunch., Disp: 60 tablet, Rfl: 0 .  mirabegron ER (MYRBETRIQ) 50 MG TB24 tablet, Take 50 mg by mouth daily., Disp: , Rfl:  .  naproxen (NAPROSYN) 500 MG tablet, Take 1 tablet (500 mg total) by mouth 2 (two) times daily with a meal., Disp: 60 tablet, Rfl: 5 .  OnabotulinumtoxinA (BOTOX IM), Inject into the muscle. Facial to treat MS, Disp: , Rfl:  .  pantoprazole (PROTONIX) 40 MG tablet, Take 1 tablet (40 mg total) by mouth daily., Disp: 90 tablet, Rfl: 1 .  pramoxine-hydrocortisone (PROCTOCREAM-HC) 1-1 % rectal cream, Place 1 application rectally 2 (two) times daily as needed for hemorrhoids or itching., Disp: 30 g, Rfl: 1 .  SUMAtriptan (IMITREX) 100 MG tablet, TAKE 1 TAB BY MOUTH EVERY 2 HRS AS NEEDED FOR MIGRAINE. MEAY REPEAT IN 2 HRS IF HEADACHE PERSISTS, Disp: 10 tablet, Rfl: 1 .  TECFIDERA 240 MG CPDR, TAKE 1 CAPSULE (240MG ) BY MOUTH TWICE DAILY, Disp: 60 capsule, Rfl: 11 .  valACYclovir (VALTREX) 1000 MG tablet, TAKE 2 TABLETS EVERY 12 HOURS X2 DOSES AS NEEDED FOR COLD SORES, Disp: 30 tablet,  Rfl: 0 .  venlafaxine XR (EFFEXOR-XR) 75 MG 24 hr capsule, Take 1 capsule (75 mg total) by mouth  daily with breakfast., Disp: 30 capsule, Rfl: 5 .  alendronate (FOSAMAX) 70 MG tablet, Take 1 tablet (70 mg total) by mouth once a week. Take with a full glass of water on an empty stomach. (Patient not taking: Reported on 01/30/2018), Disp: 4 tablet, Rfl: 2 .  amitriptyline (ELAVIL) 25 MG tablet, Take 1-2 tablets (25-50 mg total) by mouth at bedtime., Disp: 180 tablet, Rfl: 3  Current Facility-Administered Medications:  .  0.9 %  sodium chloride infusion, 500 mL, Intravenous, Continuous, Pyrtle, Lajuan Lines, MD  PAST MEDICAL HISTORY: Past Medical History:  Diagnosis Date  . Anal fissure   . Anxiety   . Arthritis   . Borderline diabetes   . Cancer [facility])    left breast cancer  . Depression   . Frequency of urination   . H/O cold sores   . History of pneumothorax    1988-- SPONTANEOUS--  RESOLVED W/ CHEST TUBE  . History of radiation therapy 07/29/16- 08/27/16   Left Breast 40.05 Gy in 15 fractions, Left Breast Boost 10 Gy in 5 fractions.   . IC (interstitial cystitis)   . Internal hemorrhoids   . Lesion of bladder   . Migraines   . MS (multiple sclerosis) ([place])    MRI showed plague on her brain  . Nocturia   . Personal history of radiation therapy   . Preeclampsia 1994  . RSD (reflex sympathetic dystrophy)   . Seasonal allergies   . Tubular adenoma of colon   . Urgency of urination     PAST SURGICAL HISTORY: Past Surgical History:  Procedure Laterality Date  . ANTERIOR CERVICAL DECOMPRESSION/DISCECTOMY FUSION 4 LEVELS N/A 07/03/2014   Procedure: ANTERIOR CERVICAL DECOMPRESSION/DISCECTOMY FUSION 4 LEVELS;  Surgeon: Newman Pies, MD;  Location: [facility];  Service: Neurosurgery;  Laterality: N/A;  C3-4 C4-5 C5-6 C6-7 Anterior cervical decompression/diskectomy/fusion/interbody prosthesis/plate  . APPENDECTOMY    . CESAREAN SECTION  1994  . CYSTOSCOPY WITH HYDRODISTENSION AND BIOPSY Bilateral 02/15/2014   Procedure: CYSTOSCOPY  BILATERAL RETROGRADE PYLOGRAM, HYDRODISTENSION,  INSTILATION OF MARCAINE AND PYRIDIUM;  Surgeon: Ardis Hughs, MD;  Location: [facility];  Service: Urology;  Laterality: Bilateral;  . D & C HYSTEROSCOPY WITH POLYPECTOMY  12-02-2003  . DILATION AND CURETTAGE OF UTERUS    . DX LAPAROSCOPY/  FULGERATION ENDOMETRIOSIS/  APPENDECTOMY  1985  . RADIOACTIVE SEED GUIDED PARTIAL MASTECTOMY WITH AXILLARY SENTINEL LYMPH NODE BIOPSY Left 06/07/2016   Procedure: BREAST LUMPECTOMY WITH RADIOACTIVE SEED AND SENTINEL LYMPH NODE BIOPSY, INJECT BLUE DYE LEFT BREAST;  Surgeon: Fanny Skates, MD;  Location: [facility];  Service: General;  Laterality: Left;  . TONSILLECTOMY AND ADENOIDECTOMY  1972    FAMILY HISTORY: Family History  Problem Relation Age of Onset  . Hypertension Mother   . Diabetes Father   . Prostate cancer Father        dx late 32s  . Lung cancer Father 49       smoker  . Cancer Father        cheek of mouth, dx. between 63 and 19  . Diabetes Paternal Grandmother   . Diabetes Paternal Grandfather   . Aneurysm Maternal Grandmother 76       d. brain aneurysm  . Prostate cancer Maternal Grandfather   . Diabetes Paternal Uncle        x 4  .  Diabetes Paternal Aunt   . Colon cancer Maternal Uncle   . Cancer Maternal Uncle        mouth cancer; +tobacco  . Breast cancer Sister 64       s/p mastectomy  . Breast cancer Maternal Aunt        dx 77s  . Lymphoma Maternal Aunt 83  . Uterine cancer Maternal Aunt        d. late 20s  . Throat cancer Cousin        maternal 1st cousin; lim info  . Cancer Paternal Uncle        NOS cancer  . Breast cancer Cousin        paternal 1st cousin dx 61s  . Esophageal cancer Neg Hx   . Rectal cancer Neg Hx   . Stomach cancer Neg Hx     SOCIAL HISTORY:  Social History   Socioeconomic History  . Marital status: Single    Spouse name: Not on file  . Number of children: 1  . Years of education: Not on file  . Highest education level: Not on file  Occupational  History  . Occupation: disabled  Social Needs  . Financial resource strain: Not on file  . Food insecurity:    Worry: Not on file    Inability: Not on file  . Transportation needs:    Medical: Not on file    Non-medical: Not on file  Tobacco Use  . Smoking status: Former Smoker    Packs/day: 0.25    Years: 27.00    Pack years: 6.75    Types: Cigarettes    Last attempt to quit: 01/17/2011    Years since quitting: 7.0  . Smokeless tobacco: Never Used  Substance and Sexual Activity  . Alcohol use: No  . Drug use: No  . Sexual activity: Never  Lifestyle  . Physical activity:    Days per week: Not on file    Minutes per session: Not on file  . Stress: Not on file  Relationships  . Social connections:    Talks on phone: Not on file    Gets together: Not on file    Attends religious service: Not on file    Active member of club or organization: Not on file    Attends meetings of clubs or organizations: Not on file    Relationship status: Not on file  . Intimate partner violence:    Fear of current or ex partner: Not on file    Emotionally abused: Not on file    Physically abused: Not on file    Forced sexual activity: Not on file  Other Topics Concern  . Not on file  Social History Narrative  . Not on file     PHYSICAL EXAM  Vitals:   01/30/18 0902  BP: 133/85  Pulse: (!) 111  Resp: 18  Weight: 129 lb (58.5 kg)  Height: 5' 9.5" (1.765 m)    Body mass index is 18.78 kg/m.   General: The patient is a thin woman in no acute distress   Neurologic Exam  Mental status: The patient is alert and oriented x 3 at the time of the examination. The patient has apparent normal recent and remote memory, with an apparently normal attention span and concentration ability.   Speech is normal.  Cranial nerves:  Marland Kitchen There were some right hemifacial spasms and mild right ptosis.  Patient strength and sensation is normal.  Trapezius and sternocleidomastoid strength  is normal. No  dysarthria is noted.  The tongue is midline, and the patient has symmetric elevation of the soft palate. No obvious hearing deficits are noted.  Motor:  Muscle bulk is normal.   Tone is normal. Strength is mildly reduced in the right triceps and intrinsic right hand muscles. Strength is normal in the legs.     Sensory: She has decreased sensation in the right arm.   '  Gait and station: Station is normal.   Gait is normal. Tandem gait is wide. Romberg is negative.   Reflexes: Deep tendon reflexes are increased in both legs with spread at the knees. There is no ankle clonus.Marland Kitchen       DIAGNOSTIC DATA (LABS, IMAGING, TESTING) - I reviewed patient records, labs, notes, testing and imaging myself where available.  Lab Results  Component Value Date   WBC 8.3 10/26/2017   HGB 12.6 10/26/2017   HCT 36.9 10/26/2017   MCV 88 10/26/2017   PLT 211 10/26/2017      Component Value Date/Time   NA 141 06/03/2016 1500   K 4.1 06/03/2016 1500   CL 105 06/03/2016 1500   CO2 30 06/03/2016 1500   GLUCOSE 94 06/03/2016 1500   BUN 12 06/03/2016 1500   CREATININE 0.73 06/03/2016 1500   CREATININE 0.55 12/26/2014 1042   CALCIUM 9.6 06/03/2016 1500   PROT 7.1 06/03/2016 1500   ALBUMIN 4.1 06/03/2016 1500   AST 21 06/03/2016 1500   ALT 16 06/03/2016 1500   ALKPHOS 75 06/03/2016 1500   BILITOT 0.7 06/03/2016 1500   GFRNONAA >60 06/03/2016 1500   GFRNONAA >89 12/26/2014 1042   GFRAA >60 06/03/2016 1500   GFRAA >89 12/26/2014 1042     __________________________________________________  BOTOX INJECTION  The risks and benefits of Botox injection brain to Mrs. Hurd. We discussed the possibility of ptosis as the most common side effect with injection for blepharospasm and hemifacial spasm.   The following injections were performed using sterile technique.  Right frontalis (2.5 units 2) Left frontalis (2.5 units 2) Procerus and corrugators (2.5 units 3) Orbicularis oculi (1.25 units 2 on the  right upper and 1.25 units 1 on the right lower and 2.5 units upper on the left and 2.5 units lower on the left) Lateral canthus (2.5 units on each side)                  Right zygomaticus (5 units x 2 ) Buccinator (5 units x 2)    Occipitalis (2.5 U x 4)    60 U  Splenius capitis (10 units 2) C3 paraspinal (5 units 2) C7 paraspinal (5 units 2)   40 U     Total 100 Units    ASSESSMENT AND PLAN  MS (multiple sclerosis) ([facility])  Chronic migraine  Hemifacial spasm  Malignant neoplasm of upper-outer quadrant of left breast in female, estrogen receptor positive ([employer])  Dysesthesia  Depression with anxiety  Blepharospasm   1.   Botox (100 units) was injected into the muscles of the face and neck as described above for hemifacial spasm, blepharospasm and migraine.   2.   Tecfidera 240 mg twice a day for MS.   3.    Renew ritalin 20 mg 2 daily hypersomnia and MS fatigue.  Renew Valium for spasticity and anxiety 4.   She will return in 3 months or sooner if there are new or worsening neurologic symptoms.    Richard A. Felecia Shelling, MD, PhD 01/30/2018, 11:51 AM  Certified in Neurology, [place] [facility], Sleep Medicine, Pain Medicine and Neuroimaging  [facility] [address], [place], [place] [address] [phone]

## 2018-01-30 NOTE — Telephone Encounter (Signed)
3 month botox °

## 2018-01-31 NOTE — Telephone Encounter (Signed)
I called and scheduled the patient for her next injection.  °

## 2018-02-09 DIAGNOSIS — M25561 Pain in right knee: Secondary | ICD-10-CM | POA: Diagnosis not present

## 2018-02-09 DIAGNOSIS — M25551 Pain in right hip: Secondary | ICD-10-CM | POA: Diagnosis not present

## 2018-02-27 DIAGNOSIS — M25561 Pain in right knee: Secondary | ICD-10-CM | POA: Diagnosis not present

## 2018-02-27 DIAGNOSIS — M25551 Pain in right hip: Secondary | ICD-10-CM | POA: Diagnosis not present

## 2018-02-28 DIAGNOSIS — G35 Multiple sclerosis: Secondary | ICD-10-CM | POA: Diagnosis not present

## 2018-02-28 DIAGNOSIS — M79651 Pain in right thigh: Secondary | ICD-10-CM | POA: Diagnosis not present

## 2018-02-28 DIAGNOSIS — M25551 Pain in right hip: Secondary | ICD-10-CM | POA: Diagnosis not present

## 2018-03-06 ENCOUNTER — Other Ambulatory Visit: Payer: Self-pay | Admitting: Neurology

## 2018-03-06 DIAGNOSIS — G35 Multiple sclerosis: Secondary | ICD-10-CM

## 2018-03-06 DIAGNOSIS — R1310 Dysphagia, unspecified: Secondary | ICD-10-CM

## 2018-03-12 ENCOUNTER — Other Ambulatory Visit: Payer: Self-pay | Admitting: Neurology

## 2018-03-17 ENCOUNTER — Telehealth: Payer: Self-pay | Admitting: Physician Assistant

## 2018-03-17 NOTE — Telephone Encounter (Signed)
Received call from patient regarding Emmi automated call to schedule AWV. Returned call to patient; no answer. Pt will have to call office to schedule appt. SF

## 2018-03-28 ENCOUNTER — Other Ambulatory Visit: Payer: Self-pay | Admitting: Neurology

## 2018-04-03 DIAGNOSIS — R300 Dysuria: Secondary | ICD-10-CM | POA: Diagnosis not present

## 2018-04-24 ENCOUNTER — Other Ambulatory Visit: Payer: Self-pay | Admitting: Physician Assistant

## 2018-04-24 ENCOUNTER — Telehealth: Payer: Self-pay | Admitting: Neurology

## 2018-04-24 DIAGNOSIS — B9689 Other specified bacterial agents as the cause of diseases classified elsewhere: Secondary | ICD-10-CM

## 2018-04-24 DIAGNOSIS — J208 Acute bronchitis due to other specified organisms: Principal | ICD-10-CM

## 2018-04-24 DIAGNOSIS — L281 Prurigo nodularis: Secondary | ICD-10-CM | POA: Diagnosis not present

## 2018-04-24 NOTE — Telephone Encounter (Signed)
I called and initiated a new precert for the patient and also put in a refill request.

## 2018-05-04 ENCOUNTER — Encounter: Payer: Self-pay | Admitting: Neurology

## 2018-05-04 ENCOUNTER — Other Ambulatory Visit: Payer: Self-pay

## 2018-05-04 ENCOUNTER — Telehealth: Payer: Self-pay | Admitting: Neurology

## 2018-05-04 ENCOUNTER — Ambulatory Visit (INDEPENDENT_AMBULATORY_CARE_PROVIDER_SITE_OTHER): Payer: Medicare Other | Admitting: Neurology

## 2018-05-04 DIAGNOSIS — G245 Blepharospasm: Secondary | ICD-10-CM | POA: Diagnosis not present

## 2018-05-04 DIAGNOSIS — G5133 Clonic hemifacial spasm, bilateral: Secondary | ICD-10-CM | POA: Diagnosis not present

## 2018-05-04 DIAGNOSIS — B9689 Other specified bacterial agents as the cause of diseases classified elsewhere: Secondary | ICD-10-CM | POA: Diagnosis not present

## 2018-05-04 DIAGNOSIS — J208 Acute bronchitis due to other specified organisms: Secondary | ICD-10-CM | POA: Diagnosis not present

## 2018-05-04 DIAGNOSIS — G35 Multiple sclerosis: Secondary | ICD-10-CM

## 2018-05-04 MED ORDER — METHYLPHENIDATE HCL 20 MG PO TABS: 20.0000 mg | ORAL_TABLET | Freq: Two times a day (BID) | ORAL | 0 refills | Status: DC

## 2018-05-04 MED ORDER — SUMATRIPTAN SUCCINATE 100 MG PO TABS: ORAL_TABLET | ORAL | 5 refills | Status: DC

## 2018-05-04 NOTE — Progress Notes (Signed)
[facility]  PATIENT: Stacy Logan DOB: [birth year]  REFERRING DOCTOR OR PCP:  Stacy Logan SOURCE: patient, EMR records from [facility].  MRI images on PACS  _________________________________   HISTORICAL  CHIEF COMPLAINT:  Chief Complaint  Patient presents with  . Multiple Sclerosis    Sts. she continues to tolerate Tecfidera well.  Here for Botox, 100u, 1 vial.  Lot# [id number]. Exp. 11/2020.  NDC 0023-1145-01/fim  . Migraines  . 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   She also has migraine headaches      Update 05/04/2018: Her right hemifacial spasms are much better since starting Botox.   She did note mild droping in her face at times and feels her smile is a lttle different.   Migraines are also better on Botox with only 1-2 a month now and only 1/2 are intense.  When she pulled on the refrigerator door 3 weeks ago, she had sudden electric like pain going down the arm into the 2nd, 3rd and 4th fingers.  She has significant degenerative changes and spinal stenosis at [facility] and C6C7.  Those images are from 2015.   There is movement artifact.  Images personally reviewed today in Stacy Logan presence.   She had C3-C7 ACDF in 2016  She is on Tecfidera and tolerates it well.    Her last differential sshowed lymphocyte count of 1.0  Update 01/30/2018: Her hemifacial spasms are doing better with Botox but the last 2 weeks they are starting to return.   She tolerated the last series of injections well and there was no ptosis following the injections as she experienced the first time.   Her migraines have also done better with Botox.   She now only has a few a month instead of almost any day.  She continues to have a lot of pain in her hands.    The worse pain is in her right middle finger.    Her MS is doing well on Tecfidera  and she denies any exacerbation.   Gait is doing better and she no longer uses a cane.  Bladder has done better with Myrbetriq.    Mood is doing ok though still mild depression.  She still notes a fair amount of fatigue.     Her breast cancer remains in remission.   Update 10/26/2017: Her hemifacial spasms are doing better with Botox, she did have a little eyelid drooping on the right, though.   Spasms started to return in about 2 weeks ago.  She also has migraines a few times a week now a used to have daily headaches. For a couple months after Botox the migraines only occur once or twice a week..   With migraines she will get nausea and photophobia and phonophobia.     She is on Tecfidera and tolerating it well.  He felt bladder function only worsened several months ago. She had incontinence and saw urology.     She is on Myrbetriq.      She feels more fatigue and is sleeping poorly.   Fatigue occurs daily. Ritalin has helped a little bit. She notes some depression and anxiety. She has mild cognitive issues with short-term memory and word finding at times.  She is done with the treatments for her breast cancer.    From 06/09/2017: Hemifacial spasms/blepharospasm:  She feels that the right hemifacial spasms in her blepharospasm improved tremendously after the last Botox therapy and the spasms have continued to do well the entire 3 months. Did not note any eyelid drooping with the last treatment course.   She tolerated it well and there were no complications. She does not note any difficulty with swallowing.  Migraine/Neck pain:  Her migraines also improved after the Botox.. At times the neck pain radiating to the right arm.Marland Kitchen EMG showed mild chronic C7 radiculopathy in the past.   Both hands have numbness.  MS:  She has no definite exacerbation though feels worse with more fatigue.  She is remaining on Tecfidera and tolerates it well. She has not had any definite exacerbations since starting the  medication.    Gait/strength/sensation/pain:  She fractured her left shoulder when she lost balance and fell.   She also broke her big toe during the fall.   The left leg does not have as much coordination as the right.   She also has a tight dysesthetic sensation in both feet and ankles and the arms have spasms.   Baclofen has helped the spasticity. However, makes her sleepy which limits the dose. She just takes at bedtime. Lamotrigine and gabapentin did not help.   Bladder:   She has urinary frequency and urgency with occasional incontinence. This is mostly stable.  Vision/eyes:   She feels her vision is stable with no new blurriness and no changes in color vision. No eye pain..  Fatigue/sleep:  She has a lot of fatigue much worse with heat.    Ritalin has only helped a little bit.    Mood/cognition:   She notes more depression with all the medical issues.   She is now on a higher dose of Effexor (150 mg daily. She also notes cognitive issues and poor focus.   Specifically, organizing is difficult.   She is forgetful and easily distracted but this is stable..   She has trouble following through with tasks.   Ritalin helps a little bit  Breast Ca:   She was diagnosed with breast cancer and had surgery and now is don with RadRx and chemo   She feels much more tired since her diagnosis..     MS/spine history:   In 2001, she was noted to have left hearing loss and had an MRI of the brain.   She did not have numbness or weakness a that time.  She was told she likely had MS.   She saw a neurologist.  She never had an LP.   She reports she was told she had MS but was never started on any medication.   Starting about 5-6 years ago, she was noting more difficulty with gait and also noted some memory issues.   She was also having bladder issues with urgency and bladder incontinence x 5 years.      However, she had no health insurance at that time and did not follow up with neurology.   She saw Stacy Logan last  year.   He ordered an MRI of the brian and cervical spine.   She had one enhancing lesion on the spine and one additional lesion.  An LP was ordered but she opted not to do the test.   She was started on tecfidera.   She has had some itching and flushing, especially if she does not take after food.     She also was found to have severe spinal stenosis  and myelopathy.   She was sent to Dr. Arnoldo Morale of Neurosurgery.   She underwent  ACDF from C3-C7 06/2014.    She reports she is having more trouble with her arms since the surgery.   The worse pain is in the 2nd and 3rd fingers but she has altered sensation with some pain in the other fingers as well.   She also has right > left lower neck pain.  Bladder is about the same (maybe slightly better) as it was before surgery.      I have personally reviewed the MRI of the brain dated 01/12/2015 and the MRIs of the cervical spine dated 02/01/2015 and 05/22/2014.   The MRI of the brain shows white matter foci predominantly in the deep white matter but also in the periventricular and subcortical white matter of both hemispheres. The brainstem appears normal. There is no significant atrophy. The MRI of the cervical spine from 2015 showed severe spinal stenosis at C5-C6 and milder spinal stenosis at C6-C7 and C4-C5. There is an enhancing focus within the spinal cord adjacent to C5-C6 and she has another hyperintense focus at C3-C4. On the second MRI, done without contrast, she is status post C3-C7 ACDF.      REVIEW OF SYSTEMS: Constitutional: No fevers, chills, sweats, or change in appetite Eyes: as above Ear, nose and throat: No hearing loss, ear pain, nasal congestion, sore throat Cardiovascular: No chest pain, palpitations Respiratory: No shortness of breath at rest or with exertion.   No wheezes GastrointestinaI: No nausea, vomiting, diarrhea, abdominal pain, fecal incontinence Genitourinary: see above Musculoskeletal: Some neck pain, back pain Integumentary:  No rash, pruritus, skin lesions Neurological: as above Psychiatric: Notes depression and anxiety Endocrine: No palpitations, diaphoresis, change in appetite, change in weigh or increased thirst Hematologic/Lymphatic: No anemia, purpura, petechiae. Allergic/Immunologic: No itchy/runny eyes, nasal congestion, recent allergic reactions, rashes  ALLERGIES: Allergies  Allergen Reactions  . Codeine     Makes her "busy", itchy  . Lamictal [Lamotrigine] Other (See Comments)    Mood changes   . Meperidine Nausea And Vomiting  . Meperidine Hcl Nausea And Vomiting    SEVERE N/V  . Morphine Other (See Comments)    "SKIN CRAWLS"    HOME MEDICATIONS:  Current Outpatient Medications:  .  amitriptyline (ELAVIL) 25 MG tablet, TAKE 1-2 TABLETS (25-50 MG TOTAL) BY MOUTH AT BEDTIME., Disp: 180 tablet, Rfl: 3 .  anastrozole (ARIMIDEX) 1 MG tablet, Take 1 mg by mouth daily., Disp: , Rfl:  .  baclofen (LIORESAL) 10 MG tablet, TAKE 1 TABLET BY MOUTH TWICE A DAY, Disp: 180 tablet, Rfl: 1 .  diazepam (VALIUM) 5 MG tablet, TAKE 1 TABLET BY MOUTH EVERY 8 HOURS AS NEEDED SPASMS, Disp: 90 tablet, Rfl: 3 .  gabapentin (NEURONTIN) 300 MG capsule, TAKE 1 CAPSULE (300 MG TOTAL) BY MOUTH 3 (THREE) TIMES DAILY., Disp: 90 capsule, Rfl: 11 .  letrozole (FEMARA) 2.5 MG tablet, TAKE 1 TABLET BY MOUTH EVERY DAY, Disp: 90 tablet, Rfl: 3 .  Meth-Hyo-M Bl-Na Phos-Ph Sal (URIBEL) 118 MG CAPS, Take 1 capsule by mouth 3 (three) times daily as needed (PAIN/UTI). , Disp: , Rfl:  .  methylphenidate (RITALIN) 20 MG tablet, Take 1 tablet (20 mg total) by mouth 2 (two) times daily with breakfast and lunch., Disp: 60 tablet, Rfl: 0 .  mirabegron ER (MYRBETRIQ) 50 MG TB24 tablet, Take 50 mg by mouth daily., Disp: , Rfl:  .  naproxen (NAPROSYN) 500 MG tablet, Take 1 tablet (500  mg total) by mouth 2 (two) times daily with a meal., Disp: 60 tablet, Rfl: 5 .  OnabotulinumtoxinA (BOTOX IM), Inject into the muscle. Facial to treat MS, Disp:  , Rfl:  .  pantoprazole (PROTONIX) 40 MG tablet, Take 1 tablet (40 mg total) by mouth daily., Disp: 90 tablet, Rfl: 1 .  pramoxine-hydrocortisone (PROCTOCREAM-HC) 1-1 % rectal cream, Place 1 application rectally 2 (two) times daily as needed for hemorrhoids or itching., Disp: 30 g, Rfl: 1 .  SUMAtriptan (IMITREX) 100 MG tablet, TAKE 1 TAB BY MOUTH EVERY 2 HRS AS NEEDED FOR MIGRAINE. MEAY REPEAT IN 2 HRS IF HEADACHE PERSISTS, Disp: 10 tablet, Rfl: 1 .  TECFIDERA 240 MG CPDR, TAKE 1 CAPSULE (240MG ) BY MOUTH TWICE DAILY, Disp: 60 capsule, Rfl: 11 .  valACYclovir (VALTREX) 1000 MG tablet, TAKE 2 TABLETS EVERY 12 HOURS X2 DOSES AS NEEDED FOR COLD SORES, Disp: 30 tablet, Rfl: 0 .  venlafaxine XR (EFFEXOR-XR) 75 MG 24 hr capsule, Take 1 capsule (75 mg total) by mouth daily with breakfast., Disp: 30 capsule, Rfl: 5 .  alendronate (FOSAMAX) 70 MG tablet, Take 1 tablet (70 mg total) by mouth once a week. Take with a full glass of water on an empty stomach. (Patient not taking: Reported on 01/30/2018), Disp: 4 tablet, Rfl: 2  Current Facility-Administered Medications:  .  0.9 %  sodium chloride infusion, 500 mL, Intravenous, Continuous, Pyrtle, Lajuan Lines, MD  PAST MEDICAL HISTORY: Past Medical History:  Diagnosis Date  . Anal fissure   . Anxiety   . Arthritis   . Borderline diabetes   . Cancer [facility])    left breast cancer  . Depression   . Frequency of urination   . H/O cold sores   . History of pneumothorax    1988-- SPONTANEOUS--  RESOLVED W/ CHEST TUBE  . History of radiation therapy 07/29/16- 08/27/16   Left Breast 40.05 Gy in 15 fractions, Left Breast Boost 10 Gy in 5 fractions.   . IC (interstitial cystitis)   . Internal hemorrhoids   . Lesion of bladder   . Migraines   . MS (multiple sclerosis) ([place])    MRI showed plague on her brain  . Nocturia   . Personal history of radiation therapy   . Preeclampsia 1994  . RSD (reflex sympathetic dystrophy)   . Seasonal allergies   . Tubular adenoma  of colon   . Urgency of urination     PAST SURGICAL HISTORY: Past Surgical History:  Procedure Laterality Date  . ANTERIOR CERVICAL DECOMPRESSION/DISCECTOMY FUSION 4 LEVELS N/A 07/03/2014   Procedure: ANTERIOR CERVICAL DECOMPRESSION/DISCECTOMY FUSION 4 LEVELS;  Surgeon: Newman Pies, MD;  Location: [facility];  Service: Neurosurgery;  Laterality: N/A;  C3-4 C4-5 C5-6 C6-7 Anterior cervical decompression/diskectomy/fusion/interbody prosthesis/plate  . APPENDECTOMY    . CESAREAN SECTION  1994  . CYSTOSCOPY WITH HYDRODISTENSION AND BIOPSY Bilateral 02/15/2014   Procedure: CYSTOSCOPY  BILATERAL RETROGRADE PYLOGRAM, HYDRODISTENSION, INSTILATION OF MARCAINE AND PYRIDIUM;  Surgeon: Ardis Hughs, MD;  [facility];  Service: Urology;  Laterality: Bilateral;  . D & C HYSTEROSCOPY WITH POLYPECTOMY  12-02-2003  . DILATION AND CURETTAGE OF UTERUS    . DX LAPAROSCOPY/  FULGERATION ENDOMETRIOSIS/  APPENDECTOMY  1985  . RADIOACTIVE SEED GUIDED PARTIAL MASTECTOMY WITH AXILLARY SENTINEL LYMPH NODE BIOPSY Left 06/07/2016   Procedure: BREAST LUMPECTOMY WITH RADIOACTIVE SEED AND SENTINEL LYMPH NODE BIOPSY, INJECT BLUE DYE LEFT BREAST;  Surgeon: Fanny Skates, MD;  Location: [facility];  Service: General;  Laterality: Left;  . TONSILLECTOMY AND ADENOIDECTOMY  1972    FAMILY HISTORY: Family History  Problem Relation Age of Onset  . Hypertension Mother   . Diabetes Father   . Prostate cancer Father        dx late 6s  . Lung cancer Father 72       smoker  . Cancer Father        cheek of mouth, dx. between 63 and 40  . Diabetes Paternal Grandmother   . Diabetes Paternal Grandfather   . Aneurysm Maternal Grandmother 76       d. brain aneurysm  . Prostate cancer Maternal Grandfather   . Diabetes Paternal Uncle        x 4  . Diabetes Paternal Aunt   . Colon cancer Maternal Uncle   . Cancer Maternal Uncle        mouth cancer; +tobacco  . Breast cancer  Sister 40       s/p mastectomy  . Breast cancer Maternal Aunt        dx 63s  . Lymphoma Maternal Aunt 83  . Uterine cancer Maternal Aunt        d. late 62s  . Throat cancer Cousin        maternal 1st cousin; lim info  . Cancer Paternal Uncle        NOS cancer  . Breast cancer Cousin        paternal 1st cousin dx 28s  . Esophageal cancer Neg Hx   . Rectal cancer Neg Hx   . Stomach cancer Neg Hx     SOCIAL HISTORY:  Social History   Socioeconomic History  . Marital status: Single    Spouse name: Not on file  . Number of children: 1  . Years of education: Not on file  . Highest education level: Not on file  Occupational History  . Occupation: disabled  Social Needs  . Financial resource strain: Not on file  . Food insecurity:    Worry: Not on file    Inability: Not on file  . Transportation needs:    Medical: Not on file    Non-medical: Not on file  Tobacco Use  . Smoking status: Former Smoker    Packs/day: 0.25    Years: 27.00    Pack years: 6.75    Types: Cigarettes    Last attempt to quit: 01/17/2011    Years since quitting: 7.2  . Smokeless tobacco: Never Used  Substance and Sexual Activity  . Alcohol use: No  . Drug use: No  . Sexual activity: Never  Lifestyle  . Physical activity:    Days per week: Not on file    Minutes per session: Not on file  . Stress: Not on file  Relationships  . Social connections:    Talks on phone: Not on file    Gets together: Not on file    Attends religious service: Not on file    Active member of club or organization: Not on file    Attends meetings of clubs or organizations: Not on file    Relationship status: Not on file  . Intimate partner violence:    Fear of current or ex partner: Not on file    Emotionally abused: Not on file    Physically abused: Not on file    Forced sexual activity: Not on file  Other Topics Concern  . Not on file  Social  History Narrative  . Not on file     PHYSICAL EXAM  There  were no vitals filed for this visit.  There is no height or weight on file to calculate BMI.   General: The patient is a thin woman in no acute distress   Neurologic Exam  Mental status: The patient is alert and oriented x 3 at the time of the examination. The patient has apparent normal recent and remote memory, with an apparently normal attention span and concentration ability.   Speech is normal.  Cranial nerves:  Marland Kitchen There were some right hemifacial spasms and mild right ptosis.  Patient strength and sensation is normal.  Trapezius and sternocleidomastoid strength is normal. No dysarthria is noted.  The tongue is midline, and the patient has symmetric elevation of the soft palate. No obvious hearing deficits are noted.  Motor:  Muscle bulk is normal.   Tone is normal. Strength is mildly reduced in the right triceps and intrinsic right hand muscles. Strength is normal in the legs.     Sensory: She has decreased sensation in the right arm.   '  Gait and station: Station is normal.   Gait is normal. Tandem gait is wide. Romberg is negative.   Reflexes: Deep tendon reflexes are increased in both legs with spread at the knees. There is no ankle clonus.Marland Kitchen       DIAGNOSTIC DATA (LABS, IMAGING, TESTING) - I reviewed patient records, labs, notes, testing and imaging myself where available.  Lab Results  Component Value Date   WBC 8.3 10/26/2017   HGB 12.6 10/26/2017   HCT 36.9 10/26/2017   MCV 88 10/26/2017   PLT 211 10/26/2017      Component Value Date/Time   NA 141 06/03/2016 1500   K 4.1 06/03/2016 1500   CL 105 06/03/2016 1500   CO2 30 06/03/2016 1500   GLUCOSE 94 06/03/2016 1500   BUN 12 06/03/2016 1500   CREATININE 0.73 06/03/2016 1500   CREATININE 0.55 12/26/2014 1042   CALCIUM 9.6 06/03/2016 1500   PROT 7.1 06/03/2016 1500   ALBUMIN 4.1 06/03/2016 1500   AST 21 06/03/2016 1500   ALT 16 06/03/2016 1500   ALKPHOS 75 06/03/2016 1500   BILITOT 0.7 06/03/2016 1500    GFRNONAA >60 06/03/2016 1500   GFRNONAA >89 12/26/2014 1042   GFRAA >60 06/03/2016 1500   GFRAA >89 12/26/2014 1042     __________________________________________________  BOTOX INJECTION  The risks and benefits of Botox injection brain to Mrs. Catala. We discussed the possibility of ptosis as the most common side effect with injection for blepharospasm and hemifacial spasm.   The following injections were performed using sterile technique.  Right frontalis (2.5 units 2) Left frontalis (2.5 units 2) Procerus and corrugators (2.5 units 3) Orbicularis oculi (1.25 units 2 on the right upper and 1.25 units 1 on the right lower and 2.5 units upper on the left and 2.5 units lower on the left) Lateral canthus (2.5 units on each side)                  Right zygomaticus (5 units x 2 ) Buccinator (5 units x 2)    Occipitalis (2.5 U x 4)    60 U  Splenius capitis (10 units 2) C3 paraspinal (5 units 2) C7 paraspinal (5 units 2)   40 U     Total 100 Units    ASSESSMENT AND PLAN  No diagnosis found.   1.  Botox (100 units) was injected into the muscles of the face and neck as described above for hemifacial spasm, blepharospasm and migraine.   2.   Tecfidera 240 mg twice a day for MS.   3.    Renew ritalin 20 mg 2 daily hypersomnia and MS fatigue.  Renew Valium for spasticity and anxiety 4.   She will return in 3 months or sooner if there are new or worsening neurologic symptoms.    Madline Oesterling A. Felecia Shelling, MD, PhD 7/54/3606, 7:70 PM Certified in Neurology, Clinical Neurophysiology, Sleep Medicine, Pain Medicine and Neuroimaging  [facility] [address] [place], [place] [address] [phone]

## 2018-05-04 NOTE — Telephone Encounter (Signed)
3 mo btx °

## 2018-05-04 NOTE — Telephone Encounter (Signed)
[employer] Medicare/medicaid order sent to [facility]. They will reach out to the pt to schedule.  °

## 2018-05-04 NOTE — Addendum Note (Signed)
Addended by: Arlice Colt A on: 05/04/2018 03:41 PM   Modules accepted: Level of Service

## 2018-05-13 ENCOUNTER — Ambulatory Visit
Admission: RE | Admit: 2018-05-13 | Discharge: 2018-05-13 | Disposition: A | Discharge status: Home / Self Care | Payer: Medicare Other | Source: Ambulatory Visit | Attending: Neurology | Admitting: Neurology

## 2018-05-13 DIAGNOSIS — G35 Multiple sclerosis: Secondary | ICD-10-CM

## 2018-05-13 MED ORDER — GADOBENATE DIMEGLUMINE 529 MG/ML IV SOLN
12.0000 mL | Freq: Once | INTRAVENOUS | Status: AC | PRN
  Administered 2018-05-13: 12 mL via INTRAVENOUS

## 2018-05-15 ENCOUNTER — Telehealth: Payer: Self-pay | Admitting: *Deleted

## 2018-05-15 NOTE — Telephone Encounter (Signed)
-----   Message from Britt Bottom, MD sent at 05/14/2018  8:02 PM EDT ----- Please let her know that the MRI of the brain and the cervical spine did not show any significant new lesions.

## 2018-05-15 NOTE — Telephone Encounter (Signed)
Spoke to patient - she is aware of results and verbalized understanding. 

## 2018-05-26 NOTE — Telephone Encounter (Signed)
I called and scheduled the patient.  °

## 2018-06-05 DIAGNOSIS — Z779 Other contact with and (suspected) exposures hazardous to health: Secondary | ICD-10-CM | POA: Diagnosis not present

## 2018-06-12 DIAGNOSIS — N870 Mild cervical dysplasia: Secondary | ICD-10-CM | POA: Diagnosis not present

## 2018-06-12 DIAGNOSIS — N888 Other specified noninflammatory disorders of cervix uteri: Secondary | ICD-10-CM | POA: Diagnosis not present

## 2018-06-12 DIAGNOSIS — N879 Dysplasia of cervix uteri, unspecified: Secondary | ICD-10-CM | POA: Diagnosis not present

## 2018-06-12 DIAGNOSIS — R87612 Low grade squamous intraepithelial lesion on cytologic smear of cervix (LGSIL): Secondary | ICD-10-CM | POA: Diagnosis not present

## 2018-06-16 IMAGING — MG DIGITAL DIAGNOSTIC BILATERAL MAMMOGRAM WITH TOMO AND CAD
9 of 13 series · 9 of 29 positions shown · non-contrast
Comparison: Previous exam(s).

CLINICAL DATA: 51-year-old female status post left lumpectomy with
radiation therapy in 3465.

EXAM:
2D DIGITAL DIAGNOSTIC BILATERAL MAMMOGRAM WITH CAD AND ADJUNCT TOMO

[L MLO (1 of 2)]
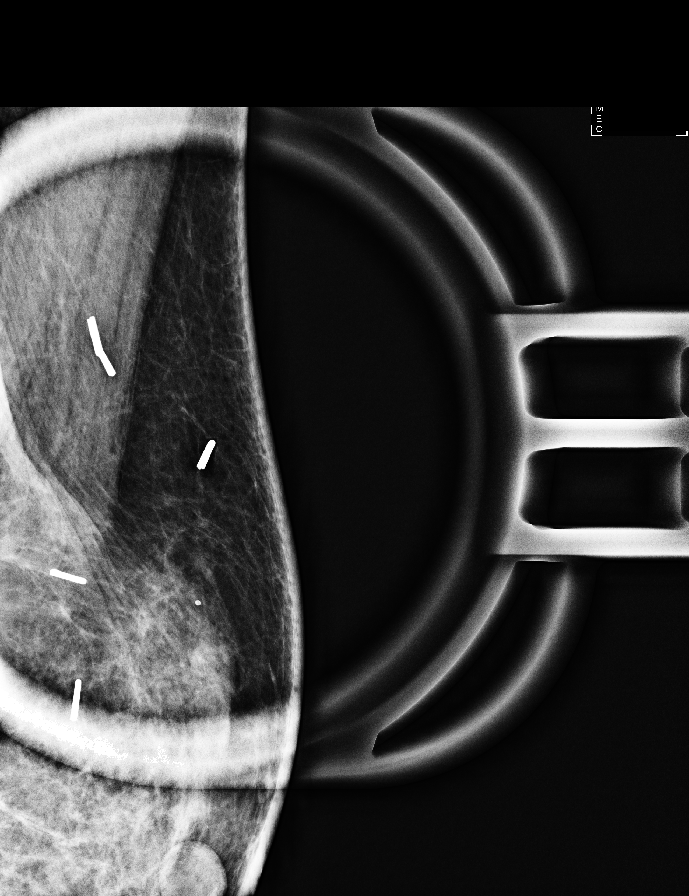

[R MLO]
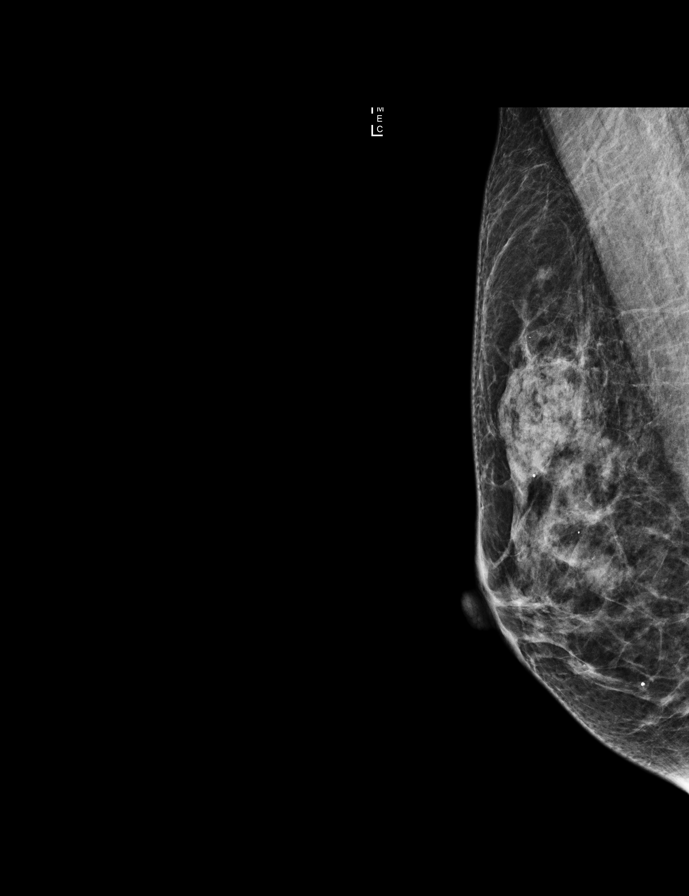

[R CC synth-2D]
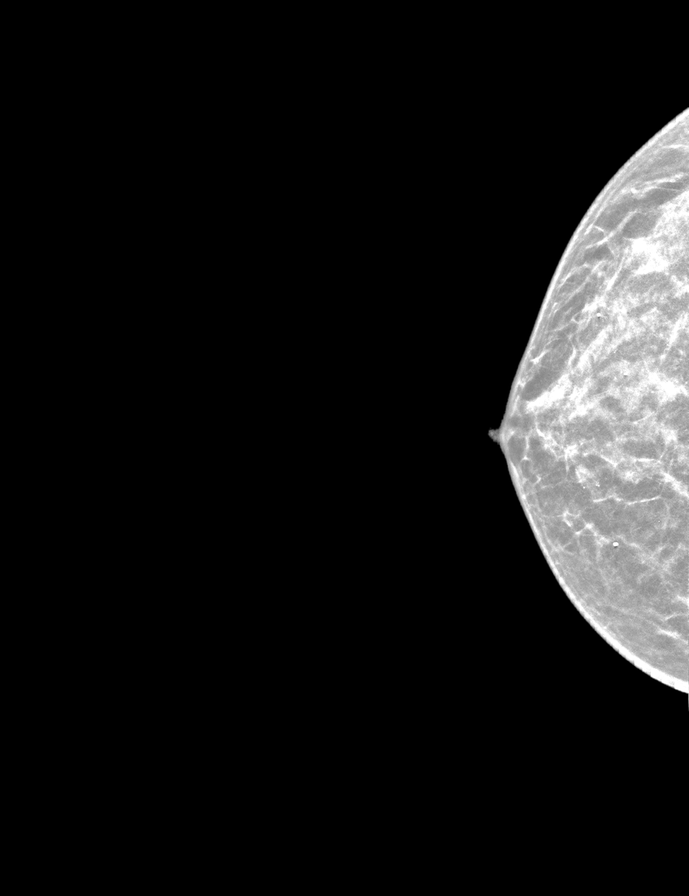

[L CC]
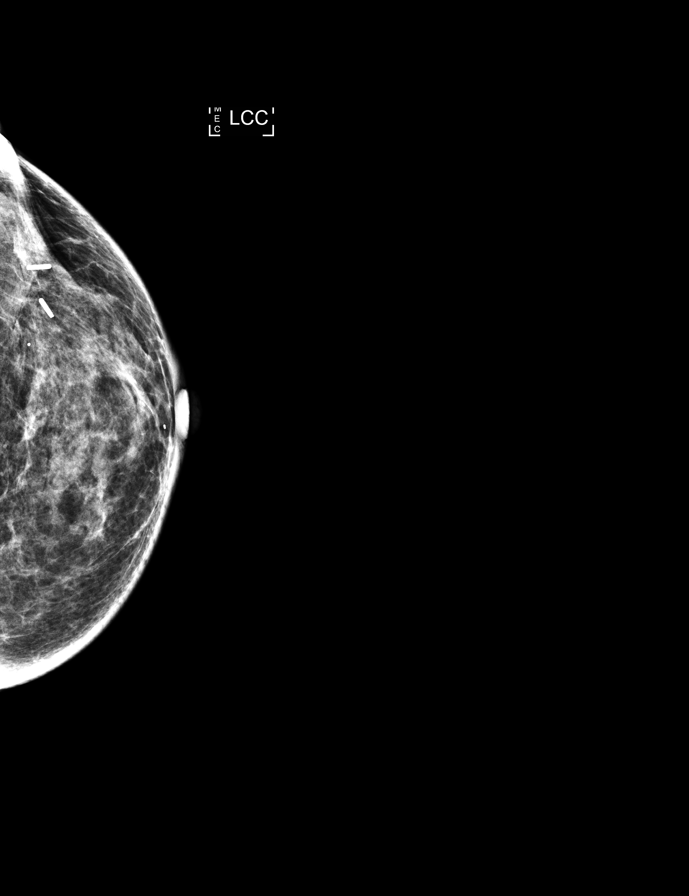

[R CC]
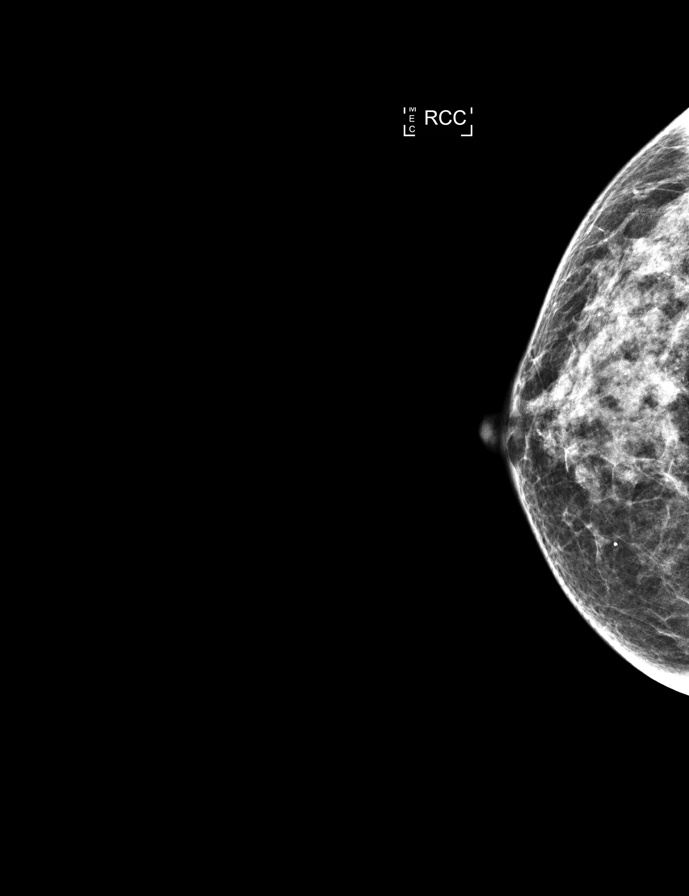

[L MLO synth-2D]
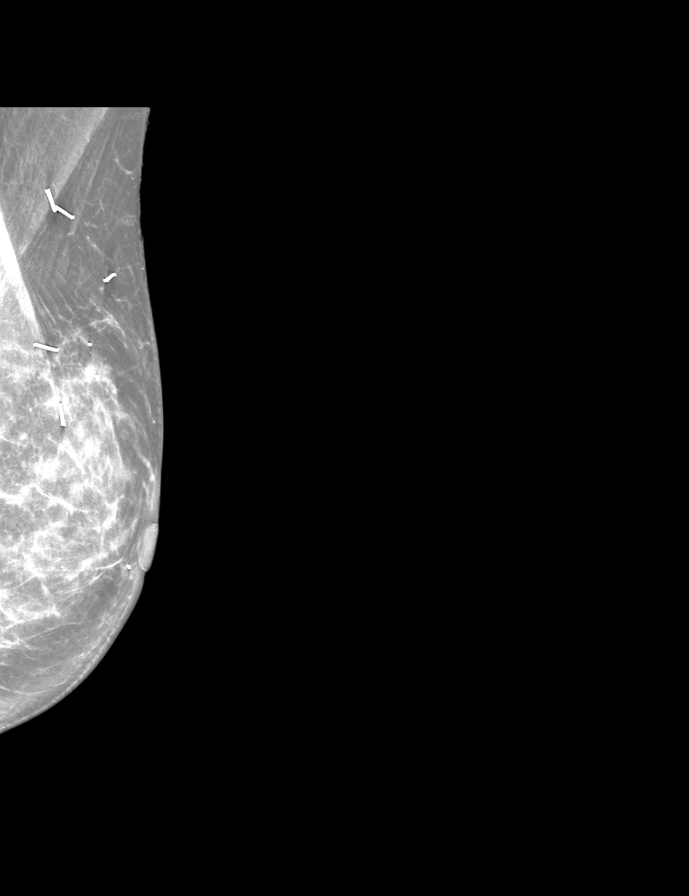

[L CC synth-2D]
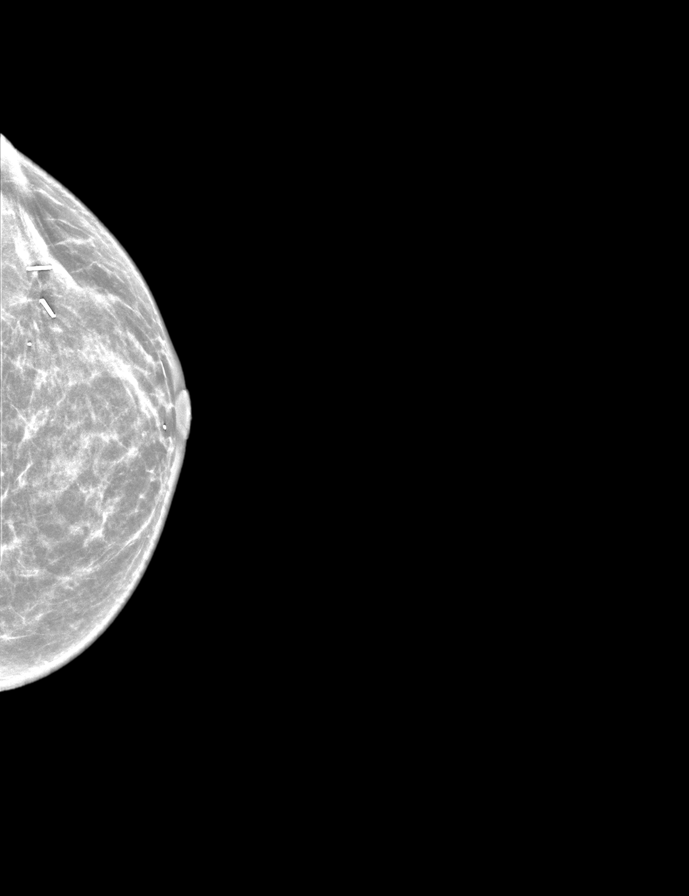

[R MLO synth-2D]
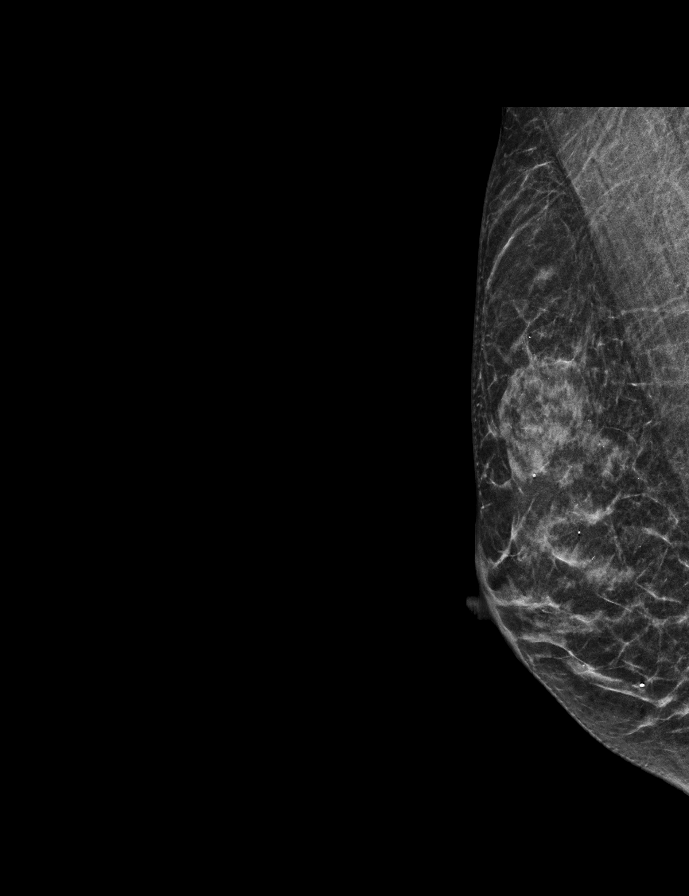

[L MLO (2 of 2)]
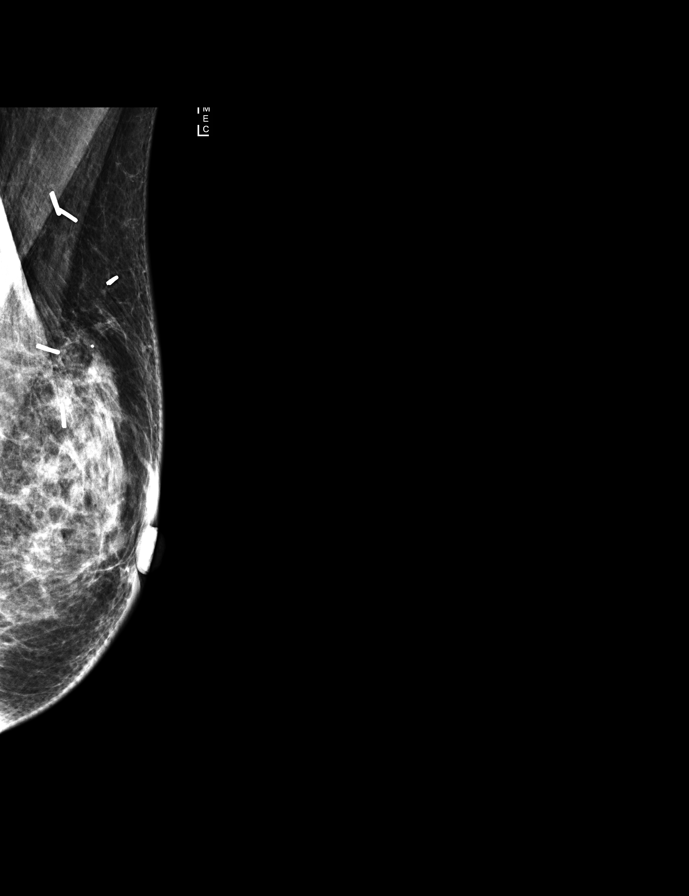

[9 of 29 positions shown; findings below may reference images not displayed]

ACR Breast Density Category c: The breast tissue is heterogeneously
dense, which may obscure small masses.
FINDINGS: Interval postsurgical changes are demonstrated in the upper outer
quadrant of the left breast. Mild left-sided skin and trabecular
thickening is consistent with radiation related changes.

No suspicious mammographic findings are identified in either breast.

Mammographic images were processed with CAD.
IMPRESSION: 1. No mammographic evidence of malignancy in either breast.
2. Interval left breast posttreatment changes.

RECOMMENDATION:
Diagnostic mammogram is suggested in 1 year. (Code:[id number]-A-ZSS)

I have discussed the findings and recommendations with the patient.
Results were also provided in writing at the conclusion of the
visit. If applicable, a reminder letter will be sent to the patient
regarding the next appointment.

BI-RADS CATEGORY  2: Benign.

## 2018-07-10 ENCOUNTER — Inpatient Hospital Stay: Payer: Medicare Other | Attending: Hematology and Oncology | Admitting: Hematology and Oncology

## 2018-07-10 DIAGNOSIS — Z923 Personal history of irradiation: Secondary | ICD-10-CM | POA: Insufficient documentation

## 2018-07-10 DIAGNOSIS — Z79899 Other long term (current) drug therapy: Secondary | ICD-10-CM | POA: Diagnosis not present

## 2018-07-10 DIAGNOSIS — Z79811 Long term (current) use of aromatase inhibitors: Secondary | ICD-10-CM | POA: Insufficient documentation

## 2018-07-10 DIAGNOSIS — C50412 Malignant neoplasm of upper-outer quadrant of left female breast: Secondary | ICD-10-CM | POA: Diagnosis not present

## 2018-07-10 DIAGNOSIS — Z170 Estrogen receptor positive status [ER+]: Secondary | ICD-10-CM | POA: Diagnosis not present

## 2018-07-10 MED ORDER — LETROZOLE 2.5 MG PO TABS: 2.5000 mg | ORAL_TABLET | Freq: Every day | ORAL | 3 refills | Status: DC

## 2018-07-10 NOTE — Assessment & Plan Note (Addendum)
Left lumpectomy 06/07/2016: IDC grade 1, 1.1 cm, DCIS intermediate grade, margins negative, 0/2 lymph nodes negative, T1c N0 stage IA  Genetic testing: Negative Oncotype DX score 22, 14% risk of recurrence with tamoxifen alone, intermediate risk Adjuvant radiation therapy 07/29/2016 to 08/27/2016  Treatment: Adjuvant antiestrogen therapy with anastrozole 1 mg by mouth daily startedJanuary 2018  Anastrozole toxicities: The first 2 weeks patient had mood swings. Since then she has been doing quite well. Denies any hot flashes or myalgias.  Surveillance:  1.  Breast exam 07/10/2018: Benign 2. Mammogram 10/31/2017: No evidence of malignancy breast density category C 3.  Bone density: T score -2 osteopenia Recommended calcium and vitamin D Return to clinic in 1 year for follow-up

## 2018-07-10 NOTE — Progress Notes (Signed)
Patient Care Team: Delorse Limber as PCP - General (Family Medicine) Newman Pies, MD as Consulting Physician (Neurosurgery) Ardis Hughs, MD as Attending Physician (Urology) Arvella Nigh, MD as Consulting Physician (Obstetrics and Gynecology) Nicholas Lose, MD as Consulting Physician (Hematology and Oncology) Fanny Skates, MD as Consulting Physician (General Surgery) Eppie Gibson, MD as Attending Physician (Radiation Oncology) Gardenia Phlegm, NP as Nurse Practitioner (Hematology and Oncology)  DIAGNOSIS:  Encounter Diagnosis  Name Primary?  . Malignant neoplasm of upper-outer quadrant of left breast in female, estrogen receptor positive ([place])     SUMMARY OF ONCOLOGIC HISTORY:   Breast cancer of upper-outer quadrant of left female breast ([place]) (Resolved)   05/10/2016 Initial Diagnosis    Left breast biopsy UOQ: IDC with DCIS, grade 1-2, ER 100%, PR 10%, Ki-67 10%, HER-2 negative ratio 1.59    05/10/2016 Mammogram    Left breast: 1.2 cm irregular marginated mass 1:00 position 6 cm from the nipple. Numerous grouped calcifications throughout right breast: Stable compared to prior, Left breast: calcifications stable, T1 cN0 stage IA clinical stage    05/11/2016 Procedure    Genetic testing:negative for mutations within any of 33 genes on the Custom Panel (Breast, Gyn, GI genes plus FLCN due to her personal history of pneumothorax) through [facility]    06/07/2016 Surgery    Left lumpectomy: IDC grade 1, 1.1 cm, DCIS intermediate grade, margins negative, 0/2 lymph nodes negative, T1c N0 stage IA     Malignant neoplasm of upper-outer quadrant of left female breast ([place])   05/10/2016 Mammogram    Left breast: 1.2 cm irregular marginated mass 1:00 position 6 cm from the nipple. Numerous grouped calcifications throughout right breast: Stable compared to prior, Left breast: calcifications stable, T1 cN0 stage IA clinical stage    05/10/2016 Initial  Diagnosis    Left breast biopsy UOQ: IDC with DCIS, grade 1-2, ER 100%, PR 10%, Ki-67 10%, HER-2 negative ratio 1.59    05/11/2016 Miscellaneous    Genetic testing:negative for mutations within any of 33 genes on the Custom Panel (Breast, Gyn, GI genes plus FLCN due to her personal history of pneumothorax) through [facility]    05/27/2016 Genetic Testing    Genetic testing was normal, and did not reveal a deleterious mutation in these genes.  Additionally, no variants of uncertain significance (VUSes) were found. Genes tested include: APC, ATM, AXIN2, BARD1, BMPR1A, BRCA1, BRCA2, BRIP1, CDH1, CDK4, CDKN2A, CHEK2, EPCAM, FANCC, FLCN, MLH1, MSH2, MSH6, MUTYH, NBN, PALB2, PMS2, POLD1, POLE, PTEN, RAD51C, RAD51D, SCG5/GREM1, SMAD4, STK11, TP53, VHL, and XRCC2      06/07/2016 Surgery    Left lumpectomy: IDC grade 1, 1.1 cm, DCIS intermediate grade, margins negative, 0/2 lymph nodes negative, T1c N0 stage IA    06/19/2016 Oncotype testing    Oncotype DX score 22, intermediate risk, 14% ROR    07/29/2016 - 08/27/2016 Radiation Therapy    Adjuvant radiation therapy (squire): 1. The Left breast was treated to 40.05 Gy in 15 fractions at 2.67 Gy per fraction. 2. The Left breast was boosted to 10 Gy in 5 fractions at 2 Gy per fraction.     09/20/2016 -  Anti-estrogen oral therapy    Anastrozole 1 mg daily     CHIEF COMPLIANT: Follow-up on anastrozole therapy  INTERVAL HISTORY: Stacy Logan is a 52 year old with above-mentioned history of left breast cancer treated with lumpectomy radiation and is currently on anastrozole therapy.  Patient has chronic neuropathy related pain in  her hands as well as chronic back pain issues.  She is so used to being in pain.  She however has been taking antiestrogen therapy.  She does have occasional hot flashes and sweats.  Denies any pain or discomfort in the breast.  She had a massage therapy and [place] and had remarkable improvement in the swelling of the left  breast.  REVIEW OF SYSTEMS:   Constitutional: Denies fevers, chills or abnormal weight loss Eyes: Denies blurriness of vision Ears, nose, mouth, throat, and face: Denies mucositis or sore throat Respiratory: Denies cough, dyspnea or wheezes Cardiovascular: Denies palpitation, chest discomfort Gastrointestinal:  Denies nausea, heartburn or change in bowel habits Skin: Denies abnormal skin rashes Lymphatics: Denies new lymphadenopathy or easy bruising Neurological: Chronic neuropathy pain Behavioral/Psych: Mood is stable, no new changes  Extremities: No lower extremity edema Breast:  denies any pain or lumps or nodules in either breasts All other systems were reviewed with the patient and are negative.  I have reviewed the past medical history, past surgical history, social history and family history with the patient and they are unchanged from previous note.  ALLERGIES:  is allergic to codeine; lamictal [lamotrigine]; meperidine; meperidine hcl; and morphine.  MEDICATIONS:  Current Outpatient Medications  Medication Sig Dispense Refill  . alendronate (FOSAMAX) 70 MG tablet Take 1 tablet (70 mg total) by mouth once a week. Take with a full glass of water on an empty stomach. (Patient not taking: Reported on 01/30/2018) 4 tablet 2  . amitriptyline (ELAVIL) 25 MG tablet TAKE 1-2 TABLETS (25-50 MG TOTAL) BY MOUTH AT BEDTIME. 180 tablet 3  . anastrozole (ARIMIDEX) 1 MG tablet Take 1 mg by mouth daily.    . baclofen (LIORESAL) 10 MG tablet TAKE 1 TABLET BY MOUTH TWICE A DAY 180 tablet 1  . diazepam (VALIUM) 5 MG tablet TAKE 1 TABLET BY MOUTH EVERY 8 HOURS AS NEEDED SPASMS 90 tablet 3  . gabapentin (NEURONTIN) 300 MG capsule TAKE 1 CAPSULE (300 MG TOTAL) BY MOUTH 3 (THREE) TIMES DAILY. 90 capsule 11  . letrozole (FEMARA) 2.5 MG tablet Take 1 tablet (2.5 mg total) by mouth daily. 90 tablet 3  . Meth-Hyo-M Bl-Na Phos-Ph Sal (URIBEL) 118 MG CAPS Take 1 capsule by mouth 3 (three) times daily as  needed (PAIN/UTI).     Marland Kitchen methylphenidate (RITALIN) 20 MG tablet Take 1 tablet (20 mg total) by mouth 2 (two) times daily with breakfast and lunch. 60 tablet 0  . mirabegron ER (MYRBETRIQ) 50 MG TB24 tablet Take 50 mg by mouth daily.    . naproxen (NAPROSYN) 500 MG tablet Take 1 tablet (500 mg total) by mouth 2 (two) times daily with a meal. 60 tablet 5  . OnabotulinumtoxinA (BOTOX IM) Inject into the muscle. Facial to treat MS    . pantoprazole (PROTONIX) 40 MG tablet Take 1 tablet (40 mg total) by mouth daily. 90 tablet 1  . pramoxine-hydrocortisone (PROCTOCREAM-HC) 1-1 % rectal cream Place 1 application rectally 2 (two) times daily as needed for hemorrhoids or itching. 30 g 1  . SUMAtriptan (IMITREX) 100 MG tablet TAKE 1 TAB BY MOUTH EVERY 2 HRS AS NEEDED FOR MIGRAINE. MEAY REPEAT IN 2 HRS IF HEADACHE PERSISTS 10 tablet 5  . TECFIDERA 240 MG CPDR TAKE 1 CAPSULE (240MG) BY MOUTH TWICE DAILY 60 capsule 11  . valACYclovir (VALTREX) 1000 MG tablet TAKE 2 TABLETS EVERY 12 HOURS X2 DOSES AS NEEDED FOR COLD SORES 30 tablet 0  . venlafaxine XR (EFFEXOR-XR) 75  MG 24 hr capsule Take 1 capsule (75 mg total) by mouth daily with breakfast. 30 capsule 5   Current Facility-Administered Medications  Medication Dose Route Frequency Provider Last Rate Last Dose  . 0.9 %  sodium chloride infusion  500 mL Intravenous Continuous Pyrtle, Lajuan Lines, MD        PHYSICAL EXAMINATION: ECOG PERFORMANCE STATUS: 1 - Symptomatic but completely ambulatory  Vitals:   07/10/18 1057  BP: (!) 141/92  Pulse: (!) 114  Resp: 18  Temp: 99.3 F (37.4 C)  SpO2: 99%   Filed Weights   07/10/18 1057  Weight: 125 lb 11.2 oz (57 kg)    GENERAL:alert, no distress and comfortable SKIN: skin color, texture, turgor are normal, no rashes or significant lesions EYES: normal, Conjunctiva are pink and non-injected, sclera clear OROPHARYNX:no exudate, no erythema and lips, buccal mucosa, and tongue normal  NECK: supple, thyroid normal  size, non-tender, without nodularity LYMPH:  no palpable lymphadenopathy in the cervical, axillary or inguinal LUNGS: clear to auscultation and percussion with normal breathing effort HEART: regular rate & rhythm and no murmurs and no lower extremity edema ABDOMEN:abdomen soft, non-tender and normal bowel sounds MUSCULOSKELETAL:no cyanosis of digits and no clubbing  NEURO: alert & oriented x 3 with fluent speech, no focal motor/sensory deficits EXTREMITIES: No lower extremity edema BREAST: No palpable masses or nodules in either right or left breasts. No palpable axillary supraclavicular or infraclavicular adenopathy no breast tenderness or nipple discharge. (exam performed in the presence of a chaperone)  LABORATORY DATA:  I have reviewed the data as listed CMP Latest Ref Rng & Units 06/03/2016 01/03/2016 05/15/2015  Glucose 65 - 99 mg/dL 94 113(H) 114(H)  BUN 6 - 20 mg/dL _0 Creatinine 0.44 - 1.00 mg/dL 0.73 0.64 0.63  Sodium 135 - 145 mmol/L 141 136 139  Potassium 3.5 - 5.1 mmol/L 4.1 3.7 3.9  Chloride 101 - 111 mmol/L 105 100(L) 103  CO2 22 - 32 mmol/L _1 Calcium 8.9 - 10.3 mg/dL 9.6 9.4 9.2  Total Protein 6.5 - 8.1 g/dL 7.1 - 7.0  Total Bilirubin 0.3 - 1.2 mg/dL 0.7 - 0.4  Alkaline Phos 38 - 126 U/L 75 - 66  AST 15 - 41 U/L 21 - 12  ALT 14 - 54 U/L 16 - 10    Lab Results  Component Value Date   WBC 8.3 10/26/2017   HGB 12.6 10/26/2017   HCT 36.9 10/26/2017   MCV 88 10/26/2017   PLT 211 10/26/2017   NEUTROABS 6.7 10/26/2017    ASSESSMENT & PLAN:  Malignant neoplasm of upper-outer quadrant of left female breast ([place]) Left lumpectomy 06/07/2016: IDC grade 1, 1.1 cm, DCIS intermediate grade, margins negative, 0/2 lymph nodes negative, T1c N0 stage IA  Genetic testing: Negative Oncotype DX score 22, 14% risk of recurrence with tamoxifen alone, intermediate risk Adjuvant radiation therapy 07/29/2016 to 08/27/2016  Treatment: Adjuvant antiestrogen therapy with  anastrozole 1 mg by mouth daily startedJanuary 2018  Anastrozole toxicities: The first 2 weeks patient had mood swings. Since then she has been doing quite well. Denies any hot flashes or myalgias.  Surveillance:  1.  Breast exam 07/10/2018: Benign 2. Mammogram 10/31/2017: No evidence of malignancy breast density category C 3.  Bone density: T score -2 osteopenia Recommended calcium and vitamin D and Fosamax. Because she is having her teeth extracted I told her not to start Fosamax until 3 months after her teeth extraction.  Patient's 2 sisters  all had breast cancer negative for genetics.  So they are looking at their genetic profile and much more detail. Return to clinic in 1 year for follow-up    No orders of the defined types were placed in this encounter.  The patient has a good understanding of the overall plan. she agrees with it. she will call with any problems that may develop before the next visit here.   Harriette Ohara, MD 07/10/18

## 2018-07-11 ENCOUNTER — Telehealth: Payer: Self-pay | Admitting: Hematology and Oncology

## 2018-07-11 NOTE — Telephone Encounter (Signed)
Per 10/21 los.  Called patient. Mailed calendar.

## 2018-07-23 ENCOUNTER — Other Ambulatory Visit: Payer: Self-pay | Admitting: Neurology

## 2018-07-25 NOTE — Telephone Encounter (Signed)
Faxed printed/signed rx diazepam to [facility] at [phone]. Received fax confirmation.

## 2018-08-01 ENCOUNTER — Telehealth: Payer: Self-pay | Admitting: Neurology

## 2018-08-01 NOTE — Telephone Encounter (Signed)
[id number] (11/01/18). DW

## 2018-08-01 NOTE — Telephone Encounter (Signed)
I called [facility] to request a delivery on the patients Botox medication.

## 2018-08-07 ENCOUNTER — Telehealth: Payer: Self-pay | Admitting: Neurology

## 2018-08-07 ENCOUNTER — Encounter: Payer: Self-pay | Admitting: Neurology

## 2018-08-07 ENCOUNTER — Ambulatory Visit (INDEPENDENT_AMBULATORY_CARE_PROVIDER_SITE_OTHER): Payer: Medicare Other | Admitting: Neurology

## 2018-08-07 VITALS — BP 127/78 | HR 90 | Ht 69.75 in | Wt 126.5 lb

## 2018-08-07 DIAGNOSIS — G35 Multiple sclerosis: Secondary | ICD-10-CM | POA: Diagnosis not present

## 2018-08-07 DIAGNOSIS — G5139 Clonic hemifacial spasm, unspecified: Secondary | ICD-10-CM | POA: Diagnosis not present

## 2018-08-07 DIAGNOSIS — IMO0002 Reserved for concepts with insufficient information to code with codable children: Secondary | ICD-10-CM

## 2018-08-07 DIAGNOSIS — G245 Blepharospasm: Secondary | ICD-10-CM | POA: Diagnosis not present

## 2018-08-07 DIAGNOSIS — G43709 Chronic migraine without aura, not intractable, without status migrainosus: Secondary | ICD-10-CM

## 2018-08-07 DIAGNOSIS — C50412 Malignant neoplasm of upper-outer quadrant of left female breast: Secondary | ICD-10-CM | POA: Diagnosis not present

## 2018-08-07 DIAGNOSIS — R5383 Other fatigue: Secondary | ICD-10-CM

## 2018-08-07 DIAGNOSIS — N3941 Urge incontinence: Secondary | ICD-10-CM

## 2018-08-07 DIAGNOSIS — Z170 Estrogen receptor positive status [ER+]: Secondary | ICD-10-CM

## 2018-08-07 MED ORDER — METHYLPHENIDATE HCL 20 MG PO TABS: 20.0000 mg | ORAL_TABLET | Freq: Two times a day (BID) | ORAL | 0 refills | Status: DC

## 2018-08-07 NOTE — Progress Notes (Signed)
[facility]  PATIENT: Stacy Logan DOB: 04-18-1966  REFERRING DOCTOR OR PCP:  Annye Asa SOURCE: patient, EMR records from [facility].  MRI images on PACS  _________________________________   HISTORICAL  CHIEF COMPLAINT:  Chief Complaint  Patient presents with  . Botulinum Toxin Injection    RM 13, alone. Botox 100U, Lot: [id number], expiration: 12/2020, NDC: 0102-7253-66    HISTORY OF PRESENT ILLNESS:  Nila Winker is a 52 y.o. 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   She also has migraine headaches      Update 08/07/18: The hemifacial spasms did very well x 2 1/2 months.  She is now just noticing mild spasms returning on the right.  She had mild eyelid drooping but it does not bother her much.   The eyelid drooping was less than the previous Botox areas  Her chronic migraines also did well x 2 1/2.   She has a more severe migraine right now that started yesterday.   Her neck pain has done better since the last visit.     Her MS is doing well on Tecfidera and she toleratres it well.   She denies having any exacerbations.  She feels gait, strength, sensation, coordination and bladder function are unchanged from her last visit.  She notes some fatigue but is doing better with the cooler weather.  Update 05/04/2018: Her right hemifacial spasms are much better since starting Botox.   She did note mild droping in her face at times and feels her smile is a lttle different.   Migraines are also better on Botox with only 1-2 a month now and only 1/2 are intense.  When she pulled on the refrigerator door 3 weeks ago, she had sudden electric like pain going down the arm into the 2nd, 3rd and 4th fingers.  She has significant degenerative changes and spinal stenosis at [facility] and C6C7.  Those images are from 2015.   There is movement artifact.  Images personally reviewed today in Ms. Cooner presence.   She  had C3-C7 ACDF in 2016  She is on Tecfidera and tolerates it well.    Her last differential sshowed lymphocyte count of 1.0  Update 01/30/2018: Her hemifacial spasms are doing better with Botox but the last 2 weeks they are starting to return.   She tolerated the last series of injections well and there was no ptosis following the injections as she experienced the first time.   Her migraines have also done better with Botox.   She now only has a few a month instead of almost any day.  She continues to have a lot of pain in her hands.    The worse pain is in her right middle finger.    Her MS is doing well on Tecfidera and she denies any exacerbation.   Gait is doing better and she no longer uses a cane.  Bladder has done better with Myrbetriq.    Mood is doing ok though still mild depression.  She still notes a fair amount of fatigue.     Her breast cancer remains in remission.   Update 10/26/2017: Her hemifacial spasms are doing better with Botox, she did have a little eyelid drooping on the right, though.   Spasms started to return in about 2 weeks ago.  She also has migraines a few times a week now a used to have daily headaches. For a couple months after Botox the  migraines only occur once or twice a week..   With migraines she will get nausea and photophobia and phonophobia.     She is on Tecfidera and tolerating it well.  He felt bladder function only worsened several months ago. She had incontinence and saw urology.     She is on Myrbetriq.      She feels more fatigue and is sleeping poorly.   Fatigue occurs daily. Ritalin has helped a little bit. She notes some depression and anxiety. She has mild cognitive issues with short-term memory and word finding at times.  She is done with the treatments for her breast cancer.    From 06/09/2017: Hemifacial spasms/blepharospasm:    She feels that the right hemifacial spasms in her blepharospasm improved tremendously after the last Botox therapy and  the spasms have continued to do well the entire 3 months. Did not note any eyelid drooping with the last treatment course.   She tolerated it well and there were no complications. She does not note any difficulty with swallowing.  Migraine/Neck pain:  Her migraines also improved after the Botox.. At times the neck pain radiating to the right arm.Marland Kitchen EMG showed mild chronic C7 radiculopathy in the past.   Both hands have numbness.  MS:  She has no definite exacerbation though feels worse with more fatigue.  She is remaining on Tecfidera and tolerates it well. She has not had any definite exacerbations since starting the medication.    Gait/strength/sensation/pain:  She fractured her left shoulder when she lost balance and fell.   She also broke her big toe during the fall.   The left leg does not have as much coordination as the right.   She also has a tight dysesthetic sensation in both feet and ankles and the arms have spasms.   Baclofen has helped the spasticity. However, makes her sleepy which limits the dose. She just takes at bedtime. Lamotrigine and gabapentin did not help.   Bladder:   She has urinary frequency and urgency with occasional incontinence. This is mostly stable.  Vision/eyes:   She feels her vision is stable with no new blurriness and no changes in color vision. No eye pain..  Fatigue/sleep:  She has a lot of fatigue much worse with heat.    Ritalin has only helped a little bit.    Mood/cognition:   She notes more depression with all the medical issues.   She is now on a higher dose of Effexor (150 mg daily. She also notes cognitive issues and poor focus.   Specifically, organizing is difficult.   She is forgetful and easily distracted but this is stable..   She has trouble following through with tasks.   Ritalin helps a little bit  Breast Ca:   She was diagnosed with breast cancer and had surgery and now is don with RadRx and chemo   She feels much more tired since her diagnosis..      MS/spine history:   In 2001, she was noted to have left hearing loss and had an MRI of the brain.   She did not have numbness or weakness a that time.  She was told she likely had MS.   She saw a neurologist.  She never had an LP.   She reports she was told she had MS but was never started on any medication.   Starting about 5-6 years ago, she was noting more difficulty with gait and also noted some memory issues.  She was also having bladder issues with urgency and bladder incontinence x 5 years.      However, she had no health insurance at that time and did not follow up with neurology.   She saw Dr. Tomi Likens last year.   He ordered an MRI of the brian and cervical spine.   She had one enhancing lesion on the spine and one additional lesion.  An LP was ordered but she opted not to do the test.   She was started on tecfidera.   She has had some itching and flushing, especially if she does not take after food.     She also was found to have severe spinal stenosis and myelopathy.   She was sent to Dr. Arnoldo Morale of Neurosurgery.   She underwent  ACDF from C3-C7 06/2014.    She reports she is having more trouble with her arms since the surgery.   The worse pain is in the 2nd and 3rd fingers but she has altered sensation with some pain in the other fingers as well.   She also has right > left lower neck pain.  Bladder is about the same (maybe slightly better) as it was before surgery.      I have personally reviewed the MRI of the brain dated 01/12/2015 and the MRIs of the cervical spine dated 02/01/2015 and 05/22/2014.   The MRI of the brain shows white matter foci predominantly in the deep white matter but also in the periventricular and subcortical white matter of both hemispheres. The brainstem appears normal. There is no significant atrophy. The MRI of the cervical spine from 2015 showed severe spinal stenosis at C5-C6 and milder spinal stenosis at C6-C7 and C4-C5. There is an enhancing focus within the spinal  cord adjacent to C5-C6 and she has another hyperintense focus at C3-C4. On the second MRI, done without contrast, she is status post C3-C7 ACDF.      REVIEW OF SYSTEMS: Constitutional: No fevers, chills, sweats, or change in appetite Eyes: as above Ear, nose and throat: No hearing loss, ear pain, nasal congestion, sore throat Cardiovascular: No chest pain, palpitations Respiratory: No shortness of breath at rest or with exertion.   No wheezes GastrointestinaI: No nausea, vomiting, diarrhea, abdominal pain, fecal incontinence Genitourinary: see above Musculoskeletal: Some neck pain, back pain Integumentary: No rash, pruritus, skin lesions Neurological: as above Psychiatric: Notes depression and anxiety Endocrine: No palpitations, diaphoresis, change in appetite, change in weigh or increased thirst Hematologic/Lymphatic: No anemia, purpura, petechiae. Allergic/Immunologic: No itchy/runny eyes, nasal congestion, recent allergic reactions, rashes  ALLERGIES: Allergies  Allergen Reactions  . Codeine     Makes her "busy", itchy  . Lamictal [Lamotrigine] Other (See Comments)    Mood changes   . Meperidine Nausea And Vomiting  . Meperidine Hcl Nausea And Vomiting    SEVERE N/V  . Morphine Other (See Comments)    "SKIN CRAWLS"    HOME MEDICATIONS:  Current Outpatient Medications:  .  alendronate (FOSAMAX) 70 MG tablet, Take 1 tablet (70 mg total) by mouth once a week. Take with a full glass of water on an empty stomach., Disp: 4 tablet, Rfl: 2 .  amitriptyline (ELAVIL) 25 MG tablet, TAKE 1-2 TABLETS (25-50 MG TOTAL) BY MOUTH AT BEDTIME., Disp: 180 tablet, Rfl: 3 .  anastrozole (ARIMIDEX) 1 MG tablet, Take 1 mg by mouth daily., Disp: , Rfl:  .  baclofen (LIORESAL) 10 MG tablet, TAKE 1 TABLET BY MOUTH TWICE A DAY, Disp: 180 tablet,  Rfl: 1 .  diazepam (VALIUM) 5 MG tablet, TAKE 1 TABLET BY MOUTH EVERY 8 HOURS AS NEEDED SPASMS, Disp: 90 tablet, Rfl: 3 .  gabapentin (NEURONTIN) 300 MG  capsule, TAKE 1 CAPSULE (300 MG TOTAL) BY MOUTH 3 (THREE) TIMES DAILY., Disp: 90 capsule, Rfl: 11 .  letrozole (FEMARA) 2.5 MG tablet, Take 1 tablet (2.5 mg total) by mouth daily., Disp: 90 tablet, Rfl: 3 .  Meth-Hyo-M Bl-Na Phos-Ph Sal (URIBEL) 118 MG CAPS, Take 1 capsule by mouth 3 (three) times daily as needed (PAIN/UTI). , Disp: , Rfl:  .  methylphenidate (RITALIN) 20 MG tablet, Take 1 tablet (20 mg total) by mouth 2 (two) times daily with breakfast and lunch., Disp: 60 tablet, Rfl: 0 .  mirabegron ER (MYRBETRIQ) 50 MG TB24 tablet, Take 50 mg by mouth daily., Disp: , Rfl:  .  naproxen (NAPROSYN) 500 MG tablet, Take 1 tablet (500 mg total) by mouth 2 (two) times daily with a meal., Disp: 60 tablet, Rfl: 5 .  OnabotulinumtoxinA (BOTOX IM), Inject into the muscle. Facial to treat MS, Disp: , Rfl:  .  pantoprazole (PROTONIX) 40 MG tablet, Take 1 tablet (40 mg total) by mouth daily., Disp: 90 tablet, Rfl: 1 .  pramoxine-hydrocortisone (PROCTOCREAM-HC) 1-1 % rectal cream, Place 1 application rectally 2 (two) times daily as needed for hemorrhoids or itching., Disp: 30 g, Rfl: 1 .  SUMAtriptan (IMITREX) 100 MG tablet, TAKE 1 TAB BY MOUTH EVERY 2 HRS AS NEEDED FOR MIGRAINE. MEAY REPEAT IN 2 HRS IF HEADACHE PERSISTS, Disp: 10 tablet, Rfl: 5 .  TECFIDERA 240 MG CPDR, TAKE 1 CAPSULE (240MG ) BY MOUTH TWICE DAILY, Disp: 60 capsule, Rfl: 11 .  valACYclovir (VALTREX) 1000 MG tablet, TAKE 2 TABLETS EVERY 12 HOURS X2 DOSES AS NEEDED FOR COLD SORES, Disp: 30 tablet, Rfl: 0 .  venlafaxine XR (EFFEXOR-XR) 75 MG 24 hr capsule, Take 1 capsule (75 mg total) by mouth daily with breakfast., Disp: 30 capsule, Rfl: 5  Current Facility-Administered Medications:  .  0.9 %  sodium chloride infusion, 500 mL, Intravenous, Continuous, Pyrtle, Lajuan Lines, MD  PAST MEDICAL HISTORY: Past Medical History:  Diagnosis Date  . Anal fissure   . Anxiety   . Arthritis   . Borderline diabetes   . Cancer [facility])    left breast cancer  .  Depression   . Frequency of urination   . H/O cold sores   . History of pneumothorax    1988-- SPONTANEOUS--  RESOLVED W/ CHEST TUBE  . History of radiation therapy 07/29/16- 08/27/16   Left Breast 40.05 Gy in 15 fractions, Left Breast Boost 10 Gy in 5 fractions.   . IC (interstitial cystitis)   . Internal hemorrhoids   . Lesion of bladder   . Migraines   . MS (multiple sclerosis) ([place])    MRI showed plague on her brain  . Nocturia   . Personal history of radiation therapy   . Preeclampsia 1994  . RSD (reflex sympathetic dystrophy)   . Seasonal allergies   . Tubular adenoma of colon   . Urgency of urination     PAST SURGICAL HISTORY: Past Surgical History:  Procedure Laterality Date  . ANTERIOR CERVICAL DECOMPRESSION/DISCECTOMY FUSION 4 LEVELS N/A 07/03/2014   Procedure: ANTERIOR CERVICAL DECOMPRESSION/DISCECTOMY FUSION 4 LEVELS;  Surgeon: Newman Pies, MD;  Location: [facility];  Service: Neurosurgery;  Laterality: N/A;  C3-4 C4-5 C5-6 C6-7 Anterior cervical decompression/diskectomy/fusion/interbody prosthesis/plate  . APPENDECTOMY    . CESAREAN SECTION  1994  . CYSTOSCOPY WITH HYDRODISTENSION AND BIOPSY Bilateral 02/15/2014   Procedure: CYSTOSCOPY  BILATERAL RETROGRADE PYLOGRAM, HYDRODISTENSION, INSTILATION OF MARCAINE AND PYRIDIUM;  Surgeon: Ardis Hughs, MD;  Location: [facility];  Service: Urology;  Laterality: Bilateral;  . D & C HYSTEROSCOPY WITH POLYPECTOMY  12-02-2003  . DILATION AND CURETTAGE OF UTERUS    . DX LAPAROSCOPY/  FULGERATION ENDOMETRIOSIS/  APPENDECTOMY  1985  . RADIOACTIVE SEED GUIDED PARTIAL MASTECTOMY WITH AXILLARY SENTINEL LYMPH NODE BIOPSY Left 06/07/2016   Procedure: BREAST LUMPECTOMY WITH RADIOACTIVE SEED AND SENTINEL LYMPH NODE BIOPSY, INJECT BLUE DYE LEFT BREAST;  Surgeon: Fanny Skates, MD;  Location: [facility];  Service: General;  Laterality: Left;  . TONSILLECTOMY AND ADENOIDECTOMY  1972    FAMILY  HISTORY: Family History  Problem Relation Age of Onset  . Hypertension Mother   . Diabetes Father   . Prostate cancer Father        dx late 25s  . Lung cancer Father 28       smoker  . Cancer Father        cheek of mouth, dx. between 71 and 64  . Diabetes Paternal Grandmother   . Diabetes Paternal Grandfather   . Aneurysm Maternal Grandmother 76       d. brain aneurysm  . Prostate cancer Maternal Grandfather   . Diabetes Paternal Uncle        x 4  . Diabetes Paternal Aunt   . Colon cancer Maternal Uncle   . Cancer Maternal Uncle        mouth cancer; +tobacco  . Breast cancer Sister 78       s/p mastectomy  . Breast cancer Maternal Aunt        dx 35s  . Lymphoma Maternal Aunt 83  . Uterine cancer Maternal Aunt        d. late 83s  . Throat cancer Cousin        maternal 1st cousin; lim info  . Cancer Paternal Uncle        NOS cancer  . Breast cancer Cousin        paternal 1st cousin dx 73s  . Esophageal cancer Neg Hx   . Rectal cancer Neg Hx   . Stomach cancer Neg Hx     SOCIAL HISTORY:  Social History   Socioeconomic History  . Marital status: Single    Spouse name: Not on file  . Number of children: 1  . Years of education: Not on file  . Highest education level: Not on file  Occupational History  . Occupation: disabled  Social Needs  . Financial resource strain: Not on file  . Food insecurity:    Worry: Not on file    Inability: Not on file  . Transportation needs:    Medical: Not on file    Non-medical: Not on file  Tobacco Use  . Smoking status: Former Smoker    Packs/day: 0.25    Years: 27.00    Pack years: 6.75    Types: Cigarettes    Last attempt to quit: 01/17/2011    Years since quitting: 7.5  . Smokeless tobacco: Never Used  Substance and Sexual Activity  . Alcohol use: No  . Drug use: No  . Sexual activity: Never  Lifestyle  . Physical activity:    Days per week: Not on file    Minutes per session: Not on file  . Stress: Not on file  Relationships  . Social connections:    Talks on phone: Not on file    Gets together: Not on file    Attends religious service: Not on file    Active member of club or organization: Not on file    Attends meetings of clubs or organizations: Not on file    Relationship status: Not on file  . Intimate partner violence:    Fear of current or ex partner: Not on file    Emotionally abused: Not on file    Physically abused: Not on file    Forced sexual activity: Not on file  Other Topics Concern  . Not on file  Social History Narrative  . Not on file     PHYSICAL EXAM  Vitals:   08/07/18 1037  BP: 127/78  Pulse: 90  Weight: 126 lb 8 oz (57.4 kg)  Height: 5' 9.75" (1.772 m)    Body mass index is 18.28 kg/m.   General: The patient is a thin woman in no acute distress   Neurologic Exam  Mental status: The patient is alert and oriented x 3 at the time of the examination. The patient has apparent normal recent and remote memory, with an apparently normal attention span and concentration ability.   Speech is normal.  Cranial nerves:  Marland Kitchen There were some right hemifacial spasms and mild right ptosis.  Patient strength and sensation is normal.  Trapezius and sternocleidomastoid strength is normal. No dysarthria is noted.  The tongue is midline, and the patient has symmetric elevation of the soft palate. No obvious hearing deficits are noted.  Motor:  Muscle bulk is normal.   Tone is normal. Strength is mildly reduced in the right triceps and intrinsic right hand muscles. Strength is normal in the legs.     Sensory: She has decreased sensation in the right arm.   '  Gait and station: Station is normal.   Gait is normal. Tandem gait is wide. Romberg is negative.   Reflexes: Deep tendon reflexes are increased in both legs with spread at the knees. There is no ankle clonus.Marland Kitchen       DIAGNOSTIC DATA (LABS, IMAGING, TESTING) - I reviewed patient records, labs, notes, testing and  imaging myself where available.  Lab Results  Component Value Date   WBC 8.3 10/26/2017   HGB 12.6 10/26/2017   HCT 36.9 10/26/2017   MCV 88 10/26/2017   PLT 211 10/26/2017      Component Value Date/Time   NA 141 06/03/2016 1500   K 4.1 06/03/2016 1500   CL 105 06/03/2016 1500   CO2 30 06/03/2016 1500   GLUCOSE 94 06/03/2016 1500   BUN 12 06/03/2016 1500   CREATININE 0.73 06/03/2016 1500   CREATININE 0.55 12/26/2014 1042   CALCIUM 9.6 06/03/2016 1500   PROT 7.1 06/03/2016 1500   ALBUMIN 4.1 06/03/2016 1500   AST 21 06/03/2016 1500   ALT 16 06/03/2016 1500   ALKPHOS 75 06/03/2016 1500   BILITOT 0.7 06/03/2016 1500   GFRNONAA >60 06/03/2016 1500   GFRNONAA >89 12/26/2014 1042   GFRAA >60 06/03/2016 1500   GFRAA >89 12/26/2014 1042     __________________________________________________  BOTOX INJECTION  The risks and benefits of Botox injection brain to Mrs. Gilliam. We discussed the possibility of ptosis as the most common side effect with injection for blepharospasm and hemifacial spasm.   The following injections were performed using sterile technique.  Right frontalis (2.5 units 2) Left frontalis (2.5 units  2) Procerus and corrugators (2.5 units 3) Orbicularis oculi (1.25 units 1 on the right upper and 1.25 units 1 on the right lower and 1.25 units left upper and 1.25 units left lower) Lateral canthus (2.5 units on each side)                  Right zygomaticus (5 units x 2 ) Right Buccinator (5 units x 2)  Temporalis/Occipitalis  (2.5 U x 6)    63 U  Splenius capitis (7.5 units 2) C3 paraspinal (6 units 2) C7 paraspinal (5 units 2)   37 U     Total 100 Units    ASSESSMENT AND PLAN  MS (multiple sclerosis) ([place]) - Plan: CBC with Differential/Platelet  Chronic migraine  Malignant neoplasm of upper-outer quadrant of left breast in female, estrogen receptor positive (HCC)  Hemifacial spasm  Urge incontinence  Other fatigue   1.   Botox  (100 units) was injected into the muscles of the face and neck as described above for hemifacial spasm, blepharospasm and migraine.   2.   Tecfidera 240 mg twice a day for MS.     Check CBC with diff.  3.    Renew ritalin 20 mg 2 daily hypersomnia and MS fatigue.  Renew Valium for spasticity and anxiety 4.   She will return in 3 months or sooner if there are new or worsening neurologic symptoms.    Rosario Duey A. Felecia Shelling, MD, PhD 51/76/1607, 37:10 AM Certified in Neurology, Clinical Neurophysiology, Sleep Medicine, Pain Medicine and Neuroimaging  [facility] [address] [place], [place] [address] [phone]

## 2018-08-07 NOTE — Telephone Encounter (Signed)
3 mos btx °

## 2018-08-08 ENCOUNTER — Telehealth: Payer: Self-pay | Admitting: *Deleted

## 2018-08-08 LAB — CBC WITH DIFFERENTIAL/PLATELET
Basophils Absolute: 0 10*3/uL (ref 0.0–0.2)
Basos: 0 %
EOS (ABSOLUTE): 0 10*3/uL (ref 0.0–0.4)
EOS: 0 %
Hematocrit: 40.2 % (ref 34.0–46.6)
Hemoglobin: 13.5 g/dL (ref 11.1–15.9)
IMMATURE GRANULOCYTES: 0 %
Immature Grans (Abs): 0 10*3/uL (ref 0.0–0.1)
Lymphocytes Absolute: 0.6 10*3/uL — ABNORMAL LOW (ref 0.7–3.1)
Lymphs: 12 %
MCH: 30.7 pg (ref 26.6–33.0)
MCHC: 33.6 g/dL (ref 31.5–35.7)
MCV: 91 fL (ref 79–97)
MONOS ABS: 0.4 10*3/uL (ref 0.1–0.9)
Monocytes: 9 %
Neutrophils Absolute: 3.8 10*3/uL (ref 1.4–7.0)
Neutrophils: 79 %
Platelets: 208 10*3/uL (ref 150–450)
RBC: 4.4 x10E6/uL (ref 3.77–5.28)
RDW: 14 % (ref 12.3–15.4)
WBC: 4.8 10*3/uL (ref 3.4–10.8)

## 2018-08-08 NOTE — Telephone Encounter (Signed)
Called and spoke with pt about lab work per Dr. Felecia Shelling note. She verbalized understanding and appreciation.

## 2018-08-08 NOTE — Telephone Encounter (Signed)
-----   Message from Britt Bottom, MD sent at 08/08/2018  2:48 PM EST ----- Please let the patient know that the lab work is fine. The lymphocyte count was borderline low.  As long as this does not drop further this is fine.

## 2018-08-10 NOTE — Telephone Encounter (Signed)
I called to schedule the patient but she did not answer and her VM was full.

## 2018-08-14 DIAGNOSIS — H04123 Dry eye syndrome of bilateral lacrimal glands: Secondary | ICD-10-CM | POA: Diagnosis not present

## 2018-08-14 DIAGNOSIS — H2513 Age-related nuclear cataract, bilateral: Secondary | ICD-10-CM | POA: Diagnosis not present

## 2018-10-15 ENCOUNTER — Other Ambulatory Visit: Payer: Self-pay | Admitting: Neurology

## 2018-10-25 ENCOUNTER — Other Ambulatory Visit: Payer: Self-pay | Admitting: Neurology

## 2019-01-02 ENCOUNTER — Telehealth: Payer: Self-pay | Admitting: Neurology

## 2019-01-02 MED ORDER — DIMETHYL FUMARATE 240 MG PO CPDR: DELAYED_RELEASE_CAPSULE | ORAL | 11 refills | Status: DC

## 2019-01-02 NOTE — Addendum Note (Signed)
Addended by: Hope Pigeon on: 01/02/2019 10:23 AM   Modules accepted: Orders

## 2019-01-02 NOTE — Telephone Encounter (Signed)
E-scribed refill as requested. 

## 2019-01-02 NOTE — Telephone Encounter (Signed)
Pt is asking for a new script refill on her TECFIDERA 240 MG CPDR [facility] - [place], [place] - [address]

## 2019-01-06 ENCOUNTER — Other Ambulatory Visit: Payer: Self-pay | Admitting: Neurology

## 2019-01-06 DIAGNOSIS — J208 Acute bronchitis due to other specified organisms: Principal | ICD-10-CM

## 2019-01-06 DIAGNOSIS — B9689 Other specified bacterial agents as the cause of diseases classified elsewhere: Secondary | ICD-10-CM

## 2019-01-21 ENCOUNTER — Other Ambulatory Visit: Payer: Self-pay

## 2019-01-21 ENCOUNTER — Emergency Department (HOSPITAL_COMMUNITY)
Admission: EM | Admit: 2019-01-21 | Discharge: 2019-01-22 | Disposition: A | Discharge status: Home / Self Care | Payer: Medicare Other | Attending: Emergency Medicine | Admitting: Emergency Medicine

## 2019-01-21 DIAGNOSIS — Z87891 Personal history of nicotine dependence: Secondary | ICD-10-CM | POA: Diagnosis not present

## 2019-01-21 DIAGNOSIS — Z79899 Other long term (current) drug therapy: Secondary | ICD-10-CM | POA: Insufficient documentation

## 2019-01-21 DIAGNOSIS — Z853 Personal history of malignant neoplasm of breast: Secondary | ICD-10-CM | POA: Insufficient documentation

## 2019-01-21 DIAGNOSIS — R110 Nausea: Secondary | ICD-10-CM | POA: Diagnosis not present

## 2019-01-21 DIAGNOSIS — R000 Tachycardia, unspecified: Secondary | ICD-10-CM | POA: Diagnosis not present

## 2019-01-21 DIAGNOSIS — G43901 Migraine, unspecified, not intractable, with status migrainosus: Secondary | ICD-10-CM | POA: Diagnosis not present

## 2019-01-21 DIAGNOSIS — G43909 Migraine, unspecified, not intractable, without status migrainosus: Secondary | ICD-10-CM | POA: Diagnosis present

## 2019-01-21 MED ORDER — KETOROLAC TROMETHAMINE 30 MG/ML IJ SOLN
30.0000 mg | Freq: Once | INTRAMUSCULAR | Status: AC
  Administered 2019-01-21: 22:00:00 30 mg via INTRAVENOUS
  Filled 2019-01-21: qty 1

## 2019-01-21 MED ORDER — DIPHENHYDRAMINE HCL 50 MG/ML IJ SOLN
25.0000 mg | Freq: Once | INTRAMUSCULAR | Status: AC
  Administered 2019-01-21: 22:00:00 25 mg via INTRAVENOUS
  Filled 2019-01-21: qty 1

## 2019-01-21 MED ORDER — MAGNESIUM SULFATE 2 GM/50ML IV SOLN
2.0000 g | Freq: Once | INTRAVENOUS | Status: AC
  Administered 2019-01-22: 2 g via INTRAVENOUS
  Filled 2019-01-21: qty 50

## 2019-01-21 MED ORDER — PROCHLORPERAZINE EDISYLATE 10 MG/2ML IJ SOLN
5.0000 mg | Freq: Once | INTRAMUSCULAR | Status: AC
  Administered 2019-01-21: 5 mg via INTRAVENOUS
  Filled 2019-01-21: qty 2

## 2019-01-21 NOTE — ED Triage Notes (Addendum)
Per ems: pt coming from home c/o migraine that started 3 days ago. Decreased PO intake due to N/V. Usually gets botox shots for migraine. Hx of MS and breast cancer, left arm restricted.   4mg  zofran and 250 mL of NS given by EMS   20 R AC

## 2019-01-21 NOTE — ED Provider Notes (Signed)
[facility] DEPT Provider Note   CSN: [id number] Arrival date & time: 01/21/19  2117    History   Chief Complaint Chief Complaint  Patient presents with  . Migraine    HPI   Stacy Logan is a 53 y.o. female who complains of migraine headache for 3 day(s). She has a well established history of recurrent migraines.  Description of pain: throbbing pain, squeezing pain, bilateral in the frontal area. Associated symptoms: light sensitivity, nausea and vomiting. Patient has already taken Imitrex for this headache without relief. She normally gets botox infections but has been unable due to the coronaviurs pandemic   There are no associated abnormal neurological symptoms such as TIA's, loss of balance, loss of vision or speech, numbness or weakness on review. Past neurological history: negative for stroke, MS, epilepsy, or brain tumor.       HPI  Past Medical History:  Diagnosis Date  . Anal fissure   . Anxiety   . Arthritis   . Borderline diabetes   . Cancer [facility])    left breast cancer  . Depression   . Frequency of urination   . H/O cold sores   . History of pneumothorax    1988-- SPONTANEOUS--  RESOLVED W/ CHEST TUBE  . History of radiation therapy 07/29/16- 08/27/16   Left Breast 40.05 Gy in 15 fractions, Left Breast Boost 10 Gy in 5 fractions.   . IC (interstitial cystitis)   . Internal hemorrhoids   . Lesion of bladder   . Migraines   . MS (multiple sclerosis) ([place])    MRI showed plague on her brain  . Nocturia   . Personal history of radiation therapy   . Preeclampsia 1994  . RSD (reflex sympathetic dystrophy)   . Seasonal allergies   . Tubular adenoma of colon   . Urgency of urination     Patient Active Problem List   Diagnosis Date Noted  . Gait disturbance 02/23/2017  . Displaced fracture of fifth metatarsal bone, left foot, initial encounter for open fracture 02/23/2017  . Elbow pain, chronic, right 02/23/2017  .  History of foot fracture 02/23/2017  . Malignant neoplasm of upper-outer quadrant of left female breast ([place]) 07/21/2016  . Genetic testing 06/27/2016  . Blepharospasm 05/19/2016  . Hemifacial spasm 05/19/2016  . Numbness 11/04/2015  . Ulnar neuropathy 11/04/2015  . Physical exam 05/15/2015  . Dysesthesia 04/15/2015  . Other fatigue 04/15/2015  . Urge incontinence 04/15/2015  . Insomnia 04/15/2015  . Memory disturbance 04/15/2015  . Bilateral arm weakness 03/28/2015  . Interstitial cystitis 03/28/2015  . Influenza with respiratory manifestation other than pneumonia 11/29/2014  . Sore in nose 10/16/2014  . Thrush 08/22/2014  . Acne cystica 07/31/2014  . Cervical spondylosis with myelopathy and radiculopathy 07/03/2014  . Hearing loss in left ear 05/30/2014  . Special screening for malignant neoplasms, colon 05/02/2014  . Unspecified constipation 05/02/2014  . Chronic migraine 04/23/2014  . Loss of weight 04/11/2014  . MS (multiple sclerosis) ([place]) 04/11/2014  . Diffuse pain 04/11/2014  . Glucosuria 01/16/2014  . Depression with anxiety 01/16/2014  . Recurrent cold sores 01/16/2014  . HYPOKALEMIA 10/02/2007  . ANEMIA, IRON DEFICIENCY 10/02/2007  . UTI 10/02/2007  . URI 08/08/2007  . CONTUSION OF UNSPECIFIED SITE 08/01/2007    Past Surgical History:  Procedure Laterality Date  . ANTERIOR CERVICAL DECOMPRESSION/DISCECTOMY FUSION 4 LEVELS N/A 07/03/2014   Procedure: ANTERIOR CERVICAL DECOMPRESSION/DISCECTOMY FUSION 4 LEVELS;  Surgeon: Newman Pies, MD;  Location: [facility];  Service: Neurosurgery;  Laterality: N/A;  C3-4 C4-5 C5-6 C6-7 Anterior cervical decompression/diskectomy/fusion/interbody prosthesis/plate  . APPENDECTOMY    . CESAREAN SECTION  1994  . CYSTOSCOPY WITH HYDRODISTENSION AND BIOPSY Bilateral 02/15/2014   Procedure: CYSTOSCOPY  BILATERAL RETROGRADE PYLOGRAM, HYDRODISTENSION, INSTILATION OF MARCAINE AND PYRIDIUM;  Surgeon: Ardis Hughs, MD;  Location:  [facility];  Service: Urology;  Laterality: Bilateral;  . D & C HYSTEROSCOPY WITH POLYPECTOMY  12-02-2003  . DILATION AND CURETTAGE OF UTERUS    . DX LAPAROSCOPY/  FULGERATION ENDOMETRIOSIS/  APPENDECTOMY  1985  . RADIOACTIVE SEED GUIDED PARTIAL MASTECTOMY WITH AXILLARY SENTINEL LYMPH NODE BIOPSY Left 06/07/2016   Procedure: BREAST LUMPECTOMY WITH RADIOACTIVE SEED AND SENTINEL LYMPH NODE BIOPSY, INJECT BLUE DYE LEFT BREAST;  Surgeon: Fanny Skates, MD;  Location: [facility];  Service: General;  Laterality: Left;  . TONSILLECTOMY AND ADENOIDECTOMY  1972     OB History   No obstetric history on file.      Home Medications    Prior to Admission medications   Medication Sig Start Date End Date Taking? Authorizing Provider  alendronate (FOSAMAX) 70 MG tablet Take 1 tablet (70 mg total) by mouth once a week. Take with a full glass of water on an empty stomach. 11/08/17   Gardenia Phlegm, NP  amitriptyline (ELAVIL) 25 MG tablet TAKE 1-2 TABLETS (25-50 MG TOTAL) BY MOUTH AT BEDTIME. 03/07/18   Sater, Nanine Means, MD  anastrozole (ARIMIDEX) 1 MG tablet Take 1 mg by mouth daily.    [provider]  baclofen (LIORESAL) 10 MG tablet TAKE 1 TABLET BY MOUTH TWICE A DAY 10/16/18   Sater, Nanine Means, MD  BOTOX 100 units SOLR injection INJECT 100 UNITS  INTRAMUSCULARLY EVERY 3  MONTHS (GIVEN AT  PRESCRIBERS OFFICE, DISCARD UNUSED) 10/25/18   Sater, Nanine Means, MD  diazepam (VALIUM) 5 MG tablet TAKE 1 TABLET BY MOUTH EVERY 8 HOURS AS NEEDED SPASMS 07/25/18   Sater, Nanine Means, MD  Dimethyl Fumarate (TECFIDERA) 240 MG CPDR TAKE 1 CAPSULE (240MG ) BY MOUTH TWICE DAILY 01/02/19   Sater, Nanine Means, MD  gabapentin (NEURONTIN) 300 MG capsule TAKE 1 CAPSULE (300 MG TOTAL) BY MOUTH 3 (THREE) TIMES DAILY. 03/28/18   Sater, Nanine Means, MD  letrozole [facility]) 2.5 MG tablet Take 1 tablet (2.5 mg total) by mouth daily. 07/10/18   Nicholas Lose, MD  Meth-Hyo-M Bl-Na Phos-Ph Sal (URIBEL)  118 MG CAPS Take 1 capsule by mouth 3 (three) times daily as needed (PAIN/UTI).     [provider]  methylphenidate (RITALIN) 20 MG tablet Take 1 tablet (20 mg total) by mouth 2 (two) times daily with breakfast and lunch. 08/07/18   Sater, Nanine Means, MD  mirabegron ER (MYRBETRIQ) 50 MG TB24 tablet Take 50 mg by mouth daily.    [provider]  naproxen (NAPROSYN) 500 MG tablet Take 1 tablet (500 mg total) by mouth 2 (two) times daily with a meal. 03/08/16   Sater, Nanine Means, MD  OnabotulinumtoxinA (BOTOX IM) Inject into the muscle. Facial to treat MS    [provider]  pantoprazole (PROTONIX) 40 MG tablet Take 1 tablet (40 mg total) by mouth daily. 06/02/17   Pyrtle, Lajuan Lines, MD  pramoxine-hydrocortisone (PROCTOCREAM-HC) 1-1 % rectal cream Place 1 application rectally 2 (two) times daily as needed for hemorrhoids or itching. 03/15/17   Pyrtle, Lajuan Lines, MD  SUMAtriptan (IMITREX) 100 MG tablet TAKE 1 TAB BY MOUTH  EVERY 2 HRS AS NEEDED FOR MIGRAINE. MEAY REPEAT IN 2 HRS IF HEADACHE PERSISTS 01/08/19   Sater, Nanine Means, MD  valACYclovir (VALTREX) 1000 MG tablet TAKE 2 TABLETS EVERY 12 HOURS X2 DOSES AS NEEDED FOR COLD SORES 04/18/17   Brunetta Jeans, PA-C  venlafaxine XR (EFFEXOR-XR) 75 MG 24 hr capsule Take 1 capsule (75 mg total) by mouth daily with breakfast. 07/16/15   Sater, Nanine Means, MD    Family History Family History  Problem Relation Age of Onset  . Hypertension Mother   . Diabetes Father   . Prostate cancer Father        dx late 19s  . Lung cancer Father 69       smoker  . Cancer Father        cheek of mouth, dx. between 67 and 9  . Diabetes Paternal Grandmother   . Diabetes Paternal Grandfather   . Aneurysm Maternal Grandmother 76       d. brain aneurysm  . Prostate cancer Maternal Grandfather   . Diabetes Paternal Uncle        x 4  . Diabetes Paternal Aunt   . Colon cancer Maternal Uncle   . Cancer Maternal Uncle        mouth cancer; +tobacco  .  Breast cancer Sister 69       s/p mastectomy  . Breast cancer Maternal Aunt        dx 57s  . Lymphoma Maternal Aunt 83  . Uterine cancer Maternal Aunt        d. late 73s  . Throat cancer Cousin        maternal 1st cousin; lim info  . Cancer Paternal Uncle        NOS cancer  . Breast cancer Cousin        paternal 1st cousin dx 50s  . Esophageal cancer Neg Hx   . Rectal cancer Neg Hx   . Stomach cancer Neg Hx     Social History Social History   Tobacco Use  . Smoking status: Former Smoker    Packs/day: 0.25    Years: 27.00    Pack years: 6.75    Types: Cigarettes    Last attempt to quit: 01/17/2011    Years since quitting: 8.0  . Smokeless tobacco: Never Used  Substance Use Topics  . Alcohol use: No  . Drug use: No     Allergies   Codeine; Lamictal [lamotrigine]; Meperidine; Meperidine hcl; and Morphine   Review of Systems Review of Systems  Ten systems reviewed and are negative for acute change, except as noted in the HPI.   Physical Exam Updated Vital Signs BP (!) 149/91   Pulse (!) 102   Ht 5\' 10"  (1.778 m)   Wt 56.7 kg   SpO2 97%   BMI 17.94 kg/m   Physical Exam Physical Exam  Constitutional: Pt is oriented to person, place, and time. Pt appears well-developed and well-nourished. No distress.  HENT:  Head: Normocephalic and atraumatic.  Mouth/Throat: Oropharynx is clear and moist.  Eyes: Conjunctivae and EOM are normal. Pupils are equal, round, and reactive to light. No scleral icterus.  No horizontal, vertical or rotational nystagmus  Neck: Normal range of motion. Neck supple.  Full active and passive ROM without pain No midline or paraspinal tenderness No nuchal rigidity or meningeal signs  Cardiovascular: Normal rate, regular rhythm and intact distal pulses.   Pulmonary/Chest: Effort normal and breath sounds normal. No respiratory  distress. Pt has no wheezes. No rales.  Abdominal: Soft. Bowel sounds are normal. There is no tenderness. There is  no rebound and no guarding.  Musculoskeletal: Normal range of motion.  Lymphadenopathy:    No cervical adenopathy.  Neurological: Pt. is alert and oriented to person, place, and time. He has normal reflexes. No cranial nerve deficit.  Exhibits normal muscle tone. Coordination normal.  Mental Status:  Alert, oriented, thought content appropriate. Speech fluent without evidence of aphasia. Able to follow 2 step commands without difficulty.  Cranial Nerves:  II:  Peripheral visual fields grossly normal, pupils equal, round, reactive to light III,IV, VI: ptosis not present, extra-ocular motions intact bilaterally  V,VII: smile symmetric, facial light touch sensation equal VIII: hearing grossly normal bilaterally  IX,X: midline uvula rise  XI: bilateral shoulder shrug equal and strong XII: midline tongue extension  Motor:  5/5 in upper and lower extremities bilaterally including strong and equal grip strength and dorsiflexion/plantar flexion Sensory: Pinprick and light touch normal in all extremities.  Deep Tendon Reflexes: 2+ and symmetric  Cerebellar: normal finger-to-nose with bilateral upper extremities Gait: normal gait and balance CV: distal pulses palpable throughout   Skin: Skin is warm and dry. No rash noted. Pt is not diaphoretic.  Psychiatric: Pt has a normal mood and affect. Behavior is normal. Judgment and thought content normal.  Nursing note and vitals reviewed.    ED Treatments / Results  Labs (all labs ordered are listed, but only abnormal results are displayed) Labs Reviewed - No data to display  EKG None  Radiology No results found.  Procedures Procedures (including critical care time)  Medications Ordered in ED Medications  prochlorperazine (COMPAZINE) injection 5 mg (has no administration in time range)  diphenhydrAMINE (BENADRYL) injection 25 mg (has no administration in time range)  ketorolac (TORADOL) 30 MG/ML injection 30 mg (has no administration in  time range)     Initial Impression / Assessment and Plan / ED Course  I have reviewed the triage vital signs and the nursing notes.  Pertinent labs & imaging results that were available during my care of the patient were reviewed by me and considered in my medical decision making (see chart for details).        Her with status migrainosus.  Documented history of migraine headaches.  I have low suspicion for other etiologies such as brain mass, subarachnoid hemorrhage.  Patient is also afebrile I doubt meningitis.  She is received a migraine cocktail and states that her pain has decreased down to 7 out of 10 however she has persistent headache which is still painful.  I have ordered acute migraine treatment with magnesium.  Patient will be given in sign out to [facility] who will reevaluate.  Expect discharge.  Final Clinical Impressions(s) / ED Diagnoses   Final diagnoses:  None    ED Discharge Orders    None       Margarita Mail, PA-C 01/21/19 2328    Lacretia Leigh, MD 01/24/19 [phone]

## 2019-01-21 NOTE — ED Notes (Signed)
Bed: YO82 Expected date:  Expected time:  Means of arrival:  Comments: Migrane

## 2019-01-22 ENCOUNTER — Other Ambulatory Visit: Payer: Self-pay | Admitting: Neurology

## 2019-01-22 ENCOUNTER — Telehealth: Payer: Self-pay | Admitting: Neurology

## 2019-01-22 DIAGNOSIS — G43901 Migraine, unspecified, not intractable, with status migrainosus: Secondary | ICD-10-CM | POA: Diagnosis not present

## 2019-01-22 MED ORDER — PROMETHAZINE HCL 25 MG PO TABS: ORAL_TABLET | ORAL | 0 refills | Status: DC

## 2019-01-22 NOTE — Telephone Encounter (Signed)
Please call in phenergan 25 mg #30 one po qd prn nausea

## 2019-01-22 NOTE — Telephone Encounter (Signed)
Late entry: I talked to patient's sister (patient lives with her) around 8 PM.  Patient's sister, Jacklyn Shell, called yesterday evening regarding patient having a three-day  migraine. She has been getting Botox injections but the most recent appointment had to be rescheduled secondary to COVID-19.  Patient has had nausea or vomiting. She gets Botox injections also for eyelid spasms and sister reported that patient is having difficulty opening her eye. She has an underlying diagnosis of MS. Sister suspected the patient was dehydrated. They had tried medications that she had available including tramadol. She also reported that patient has Imitrex but the last time they filled it it started causing her some side effects including neck muscle spasms and sister suspected that it was a different generic patch than before. Sister has a prescription from [facility], and was wondering if she should give the patient's home, I advised her against using prescription medications for her that are not actually prescribed for the patient. She had not yet tried pt's Phenergan. Sister was advised to try Phenergan but most likely patient would have to go to the emergency room especially if there was significant dehydration. Sister understood but was not able to take her herself as she had recent joint surgery. Patient's sister was advised to call 911. She was agreeable, and demonstrated understanding of recommendations. I also recommended that we try to reschedule patient's Botox injections as soon as possible. I would let Dr. Felecia Shelling and his nurse know.

## 2019-01-22 NOTE — ED Notes (Addendum)
Pt verbalized discharge instructions and follow up care. Alert and ambulatory. Friend picking pt up

## 2019-01-22 NOTE — Telephone Encounter (Signed)
Stacy Logan, If she is dehydrated, she may need to go to the emergency room to be seen.  As far as the headaches, let's see if we can get her in sometime in the next 2 weeks for her Botox.  In the meantime, she can take the Phenergan and over-the-counter NSAIDs such as Aleve (can take 2 at the same time)

## 2019-01-22 NOTE — Telephone Encounter (Signed)
I called and scheduled the patient for her Botox apt. I informed her to wear a mask and gloves to apt. I also confirmed she has not had any recent exposure, fever, or new symptoms.   She would like to speak with the nurse regarding her blood work from the ED. She wants to know what her white blood cell count was. Please call and advise. DW

## 2019-01-22 NOTE — Telephone Encounter (Signed)
I escribed rx phenergan. Called pt back. Advised I do not see any labs in Epic. She is scheduled for botox 02/01/19 at 10am with Dr. Felecia Shelling. Nothing further needed.

## 2019-01-22 NOTE — Addendum Note (Signed)
Addended by: Hope Pigeon on: 01/22/2019 03:23 PM   Modules accepted: Orders

## 2019-01-22 NOTE — Telephone Encounter (Signed)
Called pt back. She is feeling much today after being seen in ER last night. Gave her fluids/magnesium last night. She is unable to tolerate imitrex (shivers). Added this to allergy list. She has been scratching her face a lot d/t not having botox. Advised we will try to get her worked back in within the next 1-2 weeks for this. I will have Danielle call her about this.  She never got rx phenergan. She would like this sent to: [facility]

## 2019-01-22 NOTE — ED Provider Notes (Signed)
12:40 AM Headache currently rated 3/10.  Patient states that she is feeling much better.  She expresses comfort with discharge and outpatient follow-up.  Return precautions discussed and provided. Patient discharged in stable condition with no unaddressed concerns.   Antonietta Breach, PA-C 01/22/19 0041    Orpah Greek, MD 01/22/19 [phone]

## 2019-01-25 MED ORDER — RIZATRIPTAN BENZOATE 5 MG PO TABS: 5.0000 mg | ORAL_TABLET | ORAL | 5 refills | Status: DC | PRN

## 2019-01-25 NOTE — Telephone Encounter (Signed)
Dr. Sater- please advise 

## 2019-01-25 NOTE — Addendum Note (Signed)
Addended by: Hope Pigeon on: 01/25/2019 03:38 PM   Modules accepted: Orders

## 2019-01-25 NOTE — Telephone Encounter (Signed)
Lets have her try Maxalt 5 mg prn headache   #10   5 refills

## 2019-01-25 NOTE — Telephone Encounter (Signed)
Called pt. Relayed Dr. Garth Bigness message. E-scribed rx to pharmacy. Asked rx sumatriptan be d/c'd. Pt aware to stop sumatriptan and to not take with maxalt.

## 2019-01-25 NOTE — Telephone Encounter (Signed)
Pt called in and stated she can no longer take imitrex for migraines and she wants to know what else can be given

## 2019-01-29 ENCOUNTER — Telehealth: Payer: Self-pay | Admitting: Neurology

## 2019-01-29 NOTE — Telephone Encounter (Signed)
[facility] called and stated that [facility] Medicare patients can no longer fill with the pharmacy. Patient will have to be BB. The patient was on the line and I spoke with her regarding this and explained the change, she was appreciative. DW

## 2019-02-01 ENCOUNTER — Other Ambulatory Visit: Payer: Self-pay

## 2019-02-01 ENCOUNTER — Encounter: Payer: Self-pay | Admitting: Neurology

## 2019-02-01 ENCOUNTER — Telehealth: Payer: Self-pay | Admitting: Neurology

## 2019-02-01 ENCOUNTER — Ambulatory Visit (INDEPENDENT_AMBULATORY_CARE_PROVIDER_SITE_OTHER): Payer: Medicare Other | Admitting: Neurology

## 2019-02-01 VITALS — BP 122/82 | HR 94 | Temp 97.1°F | Ht 70.0 in | Wt 126.5 lb

## 2019-02-01 DIAGNOSIS — G5139 Clonic hemifacial spasm, unspecified: Secondary | ICD-10-CM

## 2019-02-01 DIAGNOSIS — G43709 Chronic migraine without aura, not intractable, without status migrainosus: Secondary | ICD-10-CM

## 2019-02-01 DIAGNOSIS — F418 Other specified anxiety disorders: Secondary | ICD-10-CM

## 2019-02-01 DIAGNOSIS — Z79899 Other long term (current) drug therapy: Secondary | ICD-10-CM

## 2019-02-01 DIAGNOSIS — G5133 Clonic hemifacial spasm, bilateral: Secondary | ICD-10-CM | POA: Diagnosis not present

## 2019-02-01 DIAGNOSIS — R5383 Other fatigue: Secondary | ICD-10-CM

## 2019-02-01 DIAGNOSIS — G35 Multiple sclerosis: Secondary | ICD-10-CM | POA: Diagnosis not present

## 2019-02-01 DIAGNOSIS — IMO0002 Reserved for concepts with insufficient information to code with codable children: Secondary | ICD-10-CM

## 2019-02-01 DIAGNOSIS — R208 Other disturbances of skin sensation: Secondary | ICD-10-CM

## 2019-02-01 MED ORDER — METHYLPHENIDATE HCL 20 MG PO TABS: 20.0000 mg | ORAL_TABLET | Freq: Two times a day (BID) | ORAL | 0 refills | Status: DC

## 2019-02-01 MED ORDER — ARIPIPRAZOLE 5 MG PO TABS: 5.0000 mg | ORAL_TABLET | Freq: Every day | ORAL | 11 refills | Status: DC

## 2019-02-01 NOTE — Telephone Encounter (Signed)
3 month btx

## 2019-02-01 NOTE — Progress Notes (Signed)
[facility]  PATIENT: Stacy Logan DOB: 01/17/1966  REFERRING DOCTOR OR PCP:  Annye Asa SOURCE: patient, EMR records from [facility].  MRI images on PACS  _________________________________   HISTORICAL  CHIEF COMPLAINT:  Chief Complaint  Patient presents with  . Botulinum Toxin Injection    For hemifacial spasms (R) and chronic migraine.  RM 13, alone. B/B [id number]. Lot: [id number]. Expiration: 06/2021. [place]: [id number].   . Multiple Sclerosis    HISTORY OF PRESENT ILLNESS:  Stacy Logan is a 53 y.o. 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   Stacy Logan also has migraine headaches    Update 02/01/2019: Stacy Logan is tolerating Tecfidera well for her MS.   Stacy Logan has no definite exacerbation but notes more bilateral facial dysesthesias.   Gait, strength and sensation are the same in her limbs. For spasticity, Stacy Logan takes baclofen (10 mg 1-2 times daily) and valium (5 mg 1-2 times daily)    For dysesthesias Stacy Logan is on gabapentin 300 mg po tid.   Fatigue is worse   Hemifacial spasms did well x 3-4 months after the last Botox but symptoms are much worse the last 2 months  Chronic migraines also did better x 3-4 months after the Botox but they are occurring daily again.   Stacy Logan is now experiencing the migraine headaches almost every day.  Stacy Logan has associated mild nausea and photophobia and phonophobia.  Moving will worsen the pain.  Stacy Logan also has neck pain, right greater than left.  When pain is present the range of motion seems worse in her neck.  Mood is doing worse and Stacy Logan has more depression and anxiety.   Stacy Logan reports her sister has noticed a big change in her mood.  Stacy Logan often has a feeling of agitation.  Stacy Logan is currently on Effexor 187.5 mg daily.  Stacy Logan has never been on Abilify or a mood stabilizer.  Stacy Logan also has more hypersomnia.  Stacy Logan feels Stacy Logan sleeps up to 12 hours a day.  Ritalin has helped the hypersomnia but not  every day.  Ritalin also helps her focus.  Update 08/07/18: The hemifacial spasms did very well x 2 1/2 months.  Stacy Logan is now just noticing mild spasms returning on the right.  Stacy Logan had mild eyelid drooping but it does not bother her much.   The eyelid drooping was less than the previous Botox areas  Her chronic migraines also did well x 2 1/2.   Stacy Logan has a more severe migraine right now that started yesterday.   Her neck pain has done better since the last visit.     Her MS is doing well on Tecfidera and Stacy Logan toleratres it well.   Stacy Logan denies having any exacerbations.  Stacy Logan feels gait, strength, sensation, coordination and bladder function are unchanged from her last visit.  Stacy Logan notes some fatigue but is doing better with the cooler weather.  Update 05/04/2018: Her right hemifacial spasms are much better since starting Botox.   Stacy Logan did note mild droping in her face at times and feels her smile is a lttle different.   Migraines are also better on Botox with only 1-2 a month now and only 1/2 are intense.  When Stacy Logan pulled on the refrigerator door 3 weeks ago, Stacy Logan had sudden electric like pain going down the arm into the 2nd, 3rd and 4th fingers.  Stacy Logan has significant degenerative changes and spinal stenosis at [facility] and C6C7.  Those images are from  2015.   There is movement artifact.  Images personally reviewed today in Ms. Ballow presence.   Stacy Logan had C3-C7 ACDF in 2016  Stacy Logan is on Tecfidera and tolerates it well.    Her last differential sshowed lymphocyte count of 1.0  Update 01/30/2018: Her hemifacial spasms are doing better with Botox but the last 2 weeks they are starting to return.   Stacy Logan tolerated the last series of injections well and there was no ptosis following the injections as Stacy Logan experienced the first time.   Her migraines have also done better with Botox.   Stacy Logan now only has a few a month instead of almost any day.  Stacy Logan continues to have a lot of pain in her hands.    The worse pain is in her right  middle finger.    Her MS is doing well on Tecfidera and Stacy Logan denies any exacerbation.   Gait is doing better and Stacy Logan no longer uses a cane.  Bladder has done better with Myrbetriq.    Mood is doing ok though still mild depression.  Stacy Logan still notes a fair amount of fatigue.     Her breast cancer remains in remission.   Update 10/26/2017: Her hemifacial spasms are doing better with Botox, Stacy Logan did have a little eyelid drooping on the right, though.   Spasms started to return in about 2 weeks ago.  Stacy Logan also has migraines a few times a week now a used to have daily headaches. For a couple months after Botox the migraines only occur once or twice a week..   With migraines Stacy Logan will get nausea and photophobia and phonophobia.     Stacy Logan is on Tecfidera and tolerating it well.  He felt bladder function only worsened several months ago. Stacy Logan had incontinence and saw urology.     Stacy Logan is on Myrbetriq.      Stacy Logan feels more fatigue and is sleeping poorly.   Fatigue occurs daily. Ritalin has helped a little bit. Stacy Logan notes some depression and anxiety. Stacy Logan has mild cognitive issues with short-term memory and word finding at times.  Stacy Logan is done with the treatments for her breast cancer.    From 06/09/2017: Hemifacial spasms/blepharospasm:    Stacy Logan feels that the right hemifacial spasms in her blepharospasm improved tremendously after the last Botox therapy and the spasms have continued to do well the entire 3 months. Did not note any eyelid drooping with the last treatment course.   Stacy Logan tolerated it well and there were no complications. Stacy Logan does not note any difficulty with swallowing.  Migraine/Neck pain:  Her migraines also improved after the Botox.. At times the neck pain radiating to the right arm.Marland Kitchen EMG showed mild chronic C7 radiculopathy in the past.   Both hands have numbness.  MS:  Stacy Logan has no definite exacerbation though feels worse with more fatigue.  Stacy Logan is remaining on Tecfidera and tolerates it well. Stacy Logan has not  had any definite exacerbations since starting the medication.    Gait/strength/sensation/pain:  Stacy Logan fractured her left shoulder when Stacy Logan lost balance and fell.   Stacy Logan also broke her big toe during the fall.   The left leg does not have as much coordination as the right.   Stacy Logan also has a tight dysesthetic sensation in both feet and ankles and the arms have spasms.   Baclofen has helped the spasticity. However, makes her sleepy which limits the dose. Stacy Logan just takes at bedtime. Lamotrigine and gabapentin did not help.  Bladder:   Stacy Logan has urinary frequency and urgency with occasional incontinence. This is mostly stable.  Vision/eyes:   Stacy Logan feels her vision is stable with no new blurriness and no changes in color vision. No eye pain..  Fatigue/sleep:  Stacy Logan has a lot of fatigue much worse with heat.    Ritalin has only helped a little bit.    Mood/cognition:   Stacy Logan notes more depression with all the medical issues.   Stacy Logan is now on a higher dose of Effexor (150 mg daily. Stacy Logan also notes cognitive issues and poor focus.   Specifically, organizing is difficult.   Stacy Logan is forgetful and easily distracted but this is stable..   Stacy Logan has trouble following through with tasks.   Ritalin helps a little bit  Breast Ca:   Stacy Logan was diagnosed with breast cancer and had surgery and now is don with RadRx and chemo   Stacy Logan feels much more tired since her diagnosis..     MS/spine history:   In 2001, Stacy Logan was noted to have left hearing loss and had an MRI of the brain.   Stacy Logan did not have numbness or weakness a that time.  Stacy Logan was told Stacy Logan likely had MS.   Stacy Logan saw a neurologist.  Stacy Logan never had an LP.   Stacy Logan reports Stacy Logan was told Stacy Logan had MS but was never started on any medication.   Starting about 5-6 years ago, Stacy Logan was noting more difficulty with gait and also noted some memory issues.   Stacy Logan was also having bladder issues with urgency and bladder incontinence x 5 years.      However, Stacy Logan had no health insurance at that time and did not  follow up with neurology.   Stacy Logan saw Dr. Tomi Likens last year.   He ordered an MRI of the brian and cervical spine.   Stacy Logan had one enhancing lesion on the spine and one additional lesion.  An LP was ordered but Stacy Logan opted not to do the test.   Stacy Logan was started on tecfidera.   Stacy Logan has had some itching and flushing, especially if Stacy Logan does not take after food.     Stacy Logan also was found to have severe spinal stenosis and myelopathy.   Stacy Logan was sent to Dr. Arnoldo Morale of Neurosurgery.   Stacy Logan underwent  ACDF from C3-C7 06/2014.    Stacy Logan reports Stacy Logan is having more trouble with her arms since the surgery.   The worse pain is in the 2nd and 3rd fingers but Stacy Logan has altered sensation with some pain in the other fingers as well.   Stacy Logan also has right > left lower neck pain.  Bladder is about the same (maybe slightly better) as it was before surgery.      I have personally reviewed the MRI of the brain dated 01/12/2015 and the MRIs of the cervical spine dated 02/01/2015 and 05/22/2014.   The MRI of the brain shows white matter foci predominantly in the deep white matter but also in the periventricular and subcortical white matter of both hemispheres. The brainstem appears normal. There is no significant atrophy. The MRI of the cervical spine from 2015 showed severe spinal stenosis at C5-C6 and milder spinal stenosis at C6-C7 and C4-C5. There is an enhancing focus within the spinal cord adjacent to C5-C6 and Stacy Logan has another hyperintense focus at C3-C4. On the second MRI, done without contrast, Stacy Logan is status post C3-C7 ACDF.      REVIEW OF SYSTEMS: Constitutional: No fevers,  chills, sweats, or change in appetite Eyes: as above Ear, nose and throat: No hearing loss, ear pain, nasal congestion, sore throat Cardiovascular: No chest pain, palpitations Respiratory: No shortness of breath at rest or with exertion.   No wheezes GastrointestinaI: No nausea, vomiting, diarrhea, abdominal pain, fecal incontinence Genitourinary: see above  Musculoskeletal: Some neck pain, back pain Integumentary: No rash, pruritus, skin lesions Neurological: as above Psychiatric: Notes depression and anxiety Endocrine: No palpitations, diaphoresis, change in appetite, change in weigh or increased thirst Hematologic/Lymphatic: No anemia, purpura, petechiae. Allergic/Immunologic: No itchy/runny eyes, nasal congestion, recent allergic reactions, rashes  ALLERGIES: Allergies  Allergen Reactions  . Codeine     Makes her "busy", itchy  . Lamictal [Lamotrigine] Other (See Comments)    Mood changes   . Meperidine Nausea And Vomiting  . Meperidine Hcl Nausea And Vomiting    SEVERE N/V  . Morphine Other (See Comments)    "SKIN CRAWLS"    HOME MEDICATIONS:  Current Outpatient Medications:  .  amitriptyline (ELAVIL) 25 MG tablet, TAKE 1-2 TABLETS (25-50 MG TOTAL) BY MOUTH AT BEDTIME., Disp: 180 tablet, Rfl: 3 .  baclofen (LIORESAL) 10 MG tablet, TAKE 1 TABLET BY MOUTH TWICE A DAY, Disp: 180 tablet, Rfl: 1 .  BOTOX 100 units SOLR injection, INJECT 100 UNITS  INTRAMUSCULARLY EVERY 3  MONTHS (GIVEN AT  PRESCRIBERS OFFICE, DISCARD UNUSED), Disp: 100 Units, Rfl: 1 .  diazepam (VALIUM) 5 MG tablet, TAKE 1 TAB BY MOUTH EVERY 8 HOURS AS NEEDED FOR SPASMS, Disp: 90 tablet, Rfl: 5 .  Dimethyl Fumarate (TECFIDERA) 240 MG CPDR, TAKE 1 CAPSULE (240MG ) BY MOUTH TWICE DAILY, Disp: 60 capsule, Rfl: 11 .  gabapentin (NEURONTIN) 300 MG capsule, TAKE 1 CAPSULE (300 MG TOTAL) BY MOUTH 3 (THREE) TIMES DAILY., Disp: 90 capsule, Rfl: 11 .  letrozole (FEMARA) 2.5 MG tablet, Take 1 tablet (2.5 mg total) by mouth daily., Disp: 90 tablet, Rfl: 3 .  methylphenidate (RITALIN) 20 MG tablet, Take 1 tablet (20 mg total) by mouth 2 (two) times daily with breakfast and lunch., Disp: 60 tablet, Rfl: 0 .  OnabotulinumtoxinA (BOTOX IM), Inject into the muscle. Facial to treat MS, Disp: , Rfl:  .  pantoprazole (PROTONIX) 40 MG tablet, Take 1 tablet (40 mg total) by mouth daily.,  Disp: 90 tablet, Rfl: 1 .  promethazine (PHENERGAN) 25 MG tablet, Take one tablet by mouth daily as needed for nausea, Disp: 30 tablet, Rfl: 0 .  rizatriptan (MAXALT) 5 MG tablet, Take 1 tablet (5 mg total) by mouth as needed for migraine. May repeat in 2 hours if needed. No more than 2 tablets in 24 hr or 2-3 doses in a week., Disp: 10 tablet, Rfl: 5 .  valACYclovir (VALTREX) 1000 MG tablet, TAKE 2 TABLETS EVERY 12 HOURS X2 DOSES AS NEEDED FOR COLD SORES, Disp: 30 tablet, Rfl: 0 .  venlafaxine XR (EFFEXOR-XR) 75 MG 24 hr capsule, Take 1 capsule (75 mg total) by mouth daily with breakfast., Disp: 30 capsule, Rfl: 5 .  ARIPiprazole (ABILIFY) 5 MG tablet, Take 1 tablet (5 mg total) by mouth daily., Disp: 30 tablet, Rfl: 11  Current Facility-Administered Medications:  .  0.9 %  sodium chloride infusion, 500 mL, Intravenous, Continuous, Pyrtle, Lajuan Lines, MD  PAST MEDICAL HISTORY: Past Medical History:  Diagnosis Date  . Anal fissure   . Anxiety   . Arthritis   . Borderline diabetes   . Cancer [facility])    left breast cancer  . Depression   .  Frequency of urination   . H/O cold sores   . History of pneumothorax    1988-- SPONTANEOUS--  RESOLVED W/ CHEST TUBE  . History of radiation therapy 07/29/16- 08/27/16   Left Breast 40.05 Gy in 15 fractions, Left Breast Boost 10 Gy in 5 fractions.   . IC (interstitial cystitis)   . Internal hemorrhoids   . Lesion of bladder   . Migraines   . MS (multiple sclerosis) ([place])    MRI showed plague on her brain  . Nocturia   . Personal history of radiation therapy   . Preeclampsia 1994  . RSD (reflex sympathetic dystrophy)   . Seasonal allergies   . Tubular adenoma of colon   . Urgency of urination     PAST SURGICAL HISTORY: Past Surgical History:  Procedure Laterality Date  . ANTERIOR CERVICAL DECOMPRESSION/DISCECTOMY FUSION 4 LEVELS N/A 07/03/2014   Procedure: ANTERIOR CERVICAL DECOMPRESSION/DISCECTOMY FUSION 4 LEVELS;  Surgeon: Newman Pies, MD;   Location: [place] [facility];  Service: Neurosurgery;  Laterality: N/A;  C3-4 C4-5 C5-6 C6-7 Anterior cervical decompression/diskectomy/fusion/interbody prosthesis/plate  . APPENDECTOMY    . CESAREAN SECTION  1994  . CYSTOSCOPY WITH HYDRODISTENSION AND BIOPSY Bilateral 02/15/2014   Procedure: CYSTOSCOPY  BILATERAL RETROGRADE PYLOGRAM, HYDRODISTENSION, INSTILATION OF MARCAINE AND PYRIDIUM;  Surgeon: Ardis Hughs, MD;  Location[facility];  Service: Urology;  Laterality: Bilateral;  . D & C HYSTEROSCOPY WITH POLYPECTOMY  12-02-2003  . DILATION AND CURETTAGE OF UTERUS    . DX LAPAROSCOPY/  FULGERATION ENDOMETRIOSIS/  APPENDECTOMY  1985  . RADIOACTIVE SEED GUIDED PARTIAL MASTECTOMY WITH AXILLARY SENTINEL LYMPH NODE BIOPSY Left 06/07/2016   Procedure: BREAST LUMPECTOMY WITH RADIOACTIVE SEED AND SENTINEL LYMPH NODE BIOPSY, INJECT BLUE DYE LEFT BREAST;  Surgeon: Fanny Skates, MD;  Location: [place];  Service: General;  Laterality: Left;  . TONSILLECTOMY AND ADENOIDECTOMY  1972    FAMILY HISTORY: Family History  Problem Relation Age of Onset  . Hypertension Mother   . Diabetes Father   . Prostate cancer Father        dx late 25s  . Lung cancer Father 66       smoker  . Cancer Father        cheek of mouth, dx. between 30 and 17  . Diabetes Paternal Grandmother   . Diabetes Paternal Grandfather   . Aneurysm Maternal Grandmother 76       d. brain aneurysm  . Prostate cancer Maternal Grandfather   . Diabetes Paternal Uncle        x 4  . Diabetes Paternal Aunt   . Colon cancer Maternal Uncle   . Cancer Maternal Uncle        mouth cancer; +tobacco  . Breast cancer Sister 26       s/p mastectomy  . Breast cancer Maternal Aunt        dx 48s  . Lymphoma Maternal Aunt 83  . Uterine cancer Maternal Aunt        d. late 86s  . Throat cancer Cousin        maternal 1st cousin; lim info  . Cancer Paternal Uncle        NOS cancer  . Breast cancer Cousin         paternal 1st cousin dx 54s  . Esophageal cancer Neg Hx   . Rectal cancer Neg Hx   . Stomach cancer Neg Hx     SOCIAL HISTORY:  Social History  Socioeconomic History  . Marital status: Single    Spouse name: Not on file  . Number of children: 1  . Years of education: Not on file  . Highest education level: Not on file  Occupational History  . Occupation: disabled  Social Needs  . Financial resource strain: Not on file  . Food insecurity:    Worry: Not on file    Inability: Not on file  . Transportation needs:    Medical: Not on file    Non-medical: Not on file  Tobacco Use  . Smoking status: Former Smoker    Packs/day: 0.25    Years: 27.00    Pack years: 6.75    Types: Cigarettes    Last attempt to quit: 01/17/2011    Years since quitting: 8.0  . Smokeless tobacco: Never Used  Substance and Sexual Activity  . Alcohol use: No  . Drug use: No  . Sexual activity: Never  Lifestyle  . Physical activity:    Days per week: Not on file    Minutes per session: Not on file  . Stress: Not on file  Relationships  . Social connections:    Talks on phone: Not on file    Gets together: Not on file    Attends religious service: Not on file    Active member of club or organization: Not on file    Attends meetings of clubs or organizations: Not on file    Relationship status: Not on file  . Intimate partner violence:    Fear of current or ex partner: Not on file    Emotionally abused: Not on file    Physically abused: Not on file    Forced sexual activity: Not on file  Other Topics Concern  . Not on file  Social History Narrative  . Not on file     PHYSICAL EXAM  Vitals:   02/01/19 0955  BP: 122/82  Pulse: 94  Temp: (!) 97.1 F (36.2 C)  Weight: 126 lb 8 oz (57.4 kg)  Height: 5\' 10"  (1.778 m)    Body mass index is 18.15 kg/m.   General: The patient is a thin woman in no acute distress   Neurologic Exam  Mental status: The patient is alert and oriented  x 3 at the time of the examination. The patient has apparent normal recent and remote memory, with an apparently normal attention span and concentration ability.   Speech is normal.  Cranial nerves:  Marland Kitchen There were some right hemifacial spasms and mild right ptosis.  Patient strength and sensation is normal.  Trapezius and sternocleidomastoid strength is normal. No dysarthria is noted.  The tongue is midline, and the patient has symmetric elevation of the soft palate. No obvious hearing deficits are noted.  Motor:  Muscle bulk is normal.   Muscle tone is normal.  Stacy Logan has 4+/5 strength in the right triceps and the intrinsic hand muscles on the right.  Strength was normal in the legs.  Sensory: Stacy Logan has decreased sensation in the right arm.   '  Gait and station: Station is normal.   Gait is fairly normal but the tandem gait is wide.  Romberg is negative.    Reflexes: Deep tendon reflexes are increased in both legs with spread at the knees. There is no ankle clonus.Marland Kitchen       DIAGNOSTIC DATA (LABS, IMAGING, TESTING) - I reviewed patient records, labs, notes, testing and imaging myself where available.  Lab Results  Component  Value Date   WBC 4.8 08/07/2018   HGB 13.5 08/07/2018   HCT 40.2 08/07/2018   MCV 91 08/07/2018   PLT 208 08/07/2018      Component Value Date/Time   NA 141 06/03/2016 1500   K 4.1 06/03/2016 1500   CL 105 06/03/2016 1500   CO2 30 06/03/2016 1500   GLUCOSE 94 06/03/2016 1500   BUN 12 06/03/2016 1500   CREATININE 0.73 06/03/2016 1500   CREATININE 0.55 12/26/2014 1042   CALCIUM 9.6 06/03/2016 1500   PROT 7.1 06/03/2016 1500   ALBUMIN 4.1 06/03/2016 1500   AST 21 06/03/2016 1500   ALT 16 06/03/2016 1500   ALKPHOS 75 06/03/2016 1500   BILITOT 0.7 06/03/2016 1500   GFRNONAA >60 06/03/2016 1500   GFRNONAA >89 12/26/2014 1042   GFRAA >60 06/03/2016 1500   GFRAA >89 12/26/2014 1042     __________________________________________________  BOTOX INJECTION  The  risks and benefits of Botox injection brain to Mrs. Barkey. We discussed the possibility of ptosis as the most common side effect with injection for blepharospasm and hemifacial spasm.   The following injections were performed using sterile technique.  Right frontalis (2.5 units 2) Left frontalis (2.5 units 2) Procerus and corrugators (2.5 units 3) Orbicularis oculi (1.25 units 1 on the right upper and 1.25 units 1 on the right lower and 1.25 units left upper and 1.25 units left lower) Lateral canthus (2.5 units on each side)                  Right zygomaticus (5 units x 2 ) Right Buccinator (5 units x 2)  Temporalis/Occipitalis  (2.5 U x 6) 63 units injected into the muscles of the face  Splenius capitis (7.5 units 2) C3 paraspinal (6 units 2) C7 paraspinal (5 units 2)    37 U injected into the muscles of the neck   Total 100 units were injected.    ASSESSMENT AND PLAN  MS (multiple sclerosis) ([place]) - Plan: CBC with Differential/Platelet, Comprehensive metabolic panel, Magnesium  High risk medication use - Plan: CBC with Differential/Platelet, Comprehensive metabolic panel, Magnesium  Chronic migraine  Hemifacial spasm  Depression with anxiety  Dysesthesia  Other fatigue   1.   Botox (100 units) was injected into the muscles of the face and neck as described above for hemifacial spasm and chronic migraine.  Stacy Logan tolerated the injections well and there were no complications. 2.   Tecfidera 240 mg twice a day for MS.     Check CBC with diff.,  Electrolytes and LFTs.  We discussed that having MS is not necessarily a risk factor for COVID-19.  However, some of the medications do have effect on the immune system and Stacy Logan might be at a mildly increased risk.  Stacy Logan should continue to follow CDC guidance as Stacy Logan has done. 3.    Renew ritalin 20 mg 2 daily hypersomnia and MS fatigue.  Continue Valium for spasticity and anxiety 4.   Continue Effexor X are and add Abilify 5 mg  daily.  Stacy Logan will return in 3 months or sooner if there are new or worsening neurologic symptoms.    Joselito Fieldhouse A. Felecia Shelling, MD, PhD 2/95/1884, 16:60 PM Certified in Neurology, Clinical Neurophysiology, Sleep Medicine, Pain Medicine and Neuroimaging  [facility] [address], [place],  [address] [phone]

## 2019-02-02 LAB — COMPREHENSIVE METABOLIC PANEL
ALT: 14 IU/L (ref 0–32)
AST: 18 IU/L (ref 0–40)
Albumin/Globulin Ratio: 2 (ref 1.2–2.2)
Albumin: 4.6 g/dL (ref 3.8–4.9)
Alkaline Phosphatase: 85 IU/L (ref 39–117)
BUN/Creatinine Ratio: 13 (ref 9–23)
BUN: 9 mg/dL (ref 6–24)
Bilirubin Total: <0.2 mg/dL (ref 0.0–1.2)
CO2: 24 mmol/L (ref 20–29)
Calcium: 10.1 mg/dL (ref 8.7–10.2)
Chloride: 100 mmol/L (ref 96–106)
Creatinine, Ser: 0.67 mg/dL (ref 0.57–1.00)
GFR calc Af Amer: 114 mL/min/{1.73_m2} (ref >59)
GFR calc non Af Amer: 99 mL/min/{1.73_m2} (ref >59)
Globulin, Total: 2.3 g/dL (ref 1.5–4.5)
Glucose: 107 mg/dL — ABNORMAL HIGH (ref 65–99)
Potassium: 4.5 mmol/L (ref 3.5–5.2)
Sodium: 139 mmol/L (ref 134–144)
Total Protein: 6.9 g/dL (ref 6.0–8.5)

## 2019-02-02 LAB — CBC WITH DIFFERENTIAL/PLATELET
Basophils Absolute: 0 10*3/uL (ref 0.0–0.2)
Basos: 0 %
EOS (ABSOLUTE): 0 10*3/uL (ref 0.0–0.4)
Eos: 1 %
Hematocrit: 37.8 % (ref 34.0–46.6)
Hemoglobin: 13.1 g/dL (ref 11.1–15.9)
Immature Grans (Abs): 0 10*3/uL (ref 0.0–0.1)
Immature Granulocytes: 0 %
Lymphocytes Absolute: 0.6 10*3/uL — ABNORMAL LOW (ref 0.7–3.1)
Lymphs: 9 %
MCH: 30.3 pg (ref 26.6–33.0)
MCHC: 34.7 g/dL (ref 31.5–35.7)
MCV: 88 fL (ref 79–97)
Monocytes Absolute: 0.5 10*3/uL (ref 0.1–0.9)
Monocytes: 7 %
Neutrophils Absolute: 5.9 10*3/uL (ref 1.4–7.0)
Neutrophils: 83 %
Platelets: 200 10*3/uL (ref 150–450)
RBC: 4.32 x10E6/uL (ref 3.77–5.28)
RDW: 13.8 % (ref 11.7–15.4)
WBC: 7.2 10*3/uL (ref 3.4–10.8)

## 2019-02-02 LAB — MAGNESIUM: Magnesium: 2 mg/dL (ref 1.6–2.3)

## 2019-02-05 ENCOUNTER — Telehealth: Payer: Self-pay | Admitting: *Deleted

## 2019-02-05 NOTE — Telephone Encounter (Signed)
Called pt. Advised we received fax from [facility] that they were unable to reach her to schedule next shipment of her Tecfidera. She will call them back. She has their contact info. Nothing further needed.

## 2019-02-13 ENCOUNTER — Telehealth: Payer: Self-pay | Admitting: Neurology

## 2019-02-13 NOTE — Telephone Encounter (Signed)
Dr. Felecia Shelling- please advise. She had labs completed 02/01/19. I can call back with results.

## 2019-02-13 NOTE — Telephone Encounter (Signed)
I spoke with the patient to schedule Botox injection. She is waiting to hear back about labs she has two weeks ago and is requesting an update on these. Please call and advise.

## 2019-02-13 NOTE — Telephone Encounter (Signed)
I called and scheduled injection for August. DW

## 2019-02-13 NOTE — Telephone Encounter (Signed)
I spoke to Stacy Logan about the lab results.  The lymphocyte count is a little bit low at 0.6.  If it was lower I would consider taking her off of the Tecfidera.  She did have a couple other readings in the past that were borderline low like this 1 but other readings were in the normal range.  We will check again in a couple months.  If there is an additional lymphocyte count 0.6 or lower we need to consider dose modification or change to different medicine

## 2019-02-15 ENCOUNTER — Other Ambulatory Visit: Payer: Self-pay | Admitting: Neurology

## 2019-02-15 DIAGNOSIS — R1310 Dysphagia, unspecified: Secondary | ICD-10-CM

## 2019-02-15 DIAGNOSIS — G35 Multiple sclerosis: Secondary | ICD-10-CM

## 2019-02-24 ENCOUNTER — Other Ambulatory Visit: Payer: Self-pay | Admitting: Neurology

## 2019-03-31 ENCOUNTER — Other Ambulatory Visit: Payer: Self-pay | Admitting: Neurology

## 2019-04-06 DIAGNOSIS — Z7189 Other specified counseling: Secondary | ICD-10-CM | POA: Diagnosis not present

## 2019-04-06 DIAGNOSIS — Z03818 Encounter for observation for suspected exposure to other biological agents ruled out: Secondary | ICD-10-CM | POA: Diagnosis not present

## 2019-04-08 ENCOUNTER — Other Ambulatory Visit: Payer: Self-pay | Admitting: Neurology

## 2019-04-16 ENCOUNTER — Other Ambulatory Visit: Payer: Self-pay | Admitting: Neurology

## 2019-04-30 ENCOUNTER — Other Ambulatory Visit: Payer: Self-pay | Admitting: Neurology

## 2019-05-09 ENCOUNTER — Telehealth: Payer: Self-pay | Admitting: Neurology

## 2019-05-09 ENCOUNTER — Ambulatory Visit (INDEPENDENT_AMBULATORY_CARE_PROVIDER_SITE_OTHER): Payer: Medicare Other | Admitting: Neurology

## 2019-05-09 ENCOUNTER — Other Ambulatory Visit: Payer: Self-pay

## 2019-05-09 VITALS — BP 122/79 | HR 102 | Temp 96.2°F | Ht 70.0 in | Wt 118.5 lb

## 2019-05-09 DIAGNOSIS — G43709 Chronic migraine without aura, not intractable, without status migrainosus: Secondary | ICD-10-CM | POA: Diagnosis not present

## 2019-05-09 DIAGNOSIS — F418 Other specified anxiety disorders: Secondary | ICD-10-CM

## 2019-05-09 DIAGNOSIS — G35 Multiple sclerosis: Secondary | ICD-10-CM | POA: Diagnosis not present

## 2019-05-09 DIAGNOSIS — IMO0002 Reserved for concepts with insufficient information to code with codable children: Secondary | ICD-10-CM

## 2019-05-09 DIAGNOSIS — G5139 Clonic hemifacial spasm, unspecified: Secondary | ICD-10-CM | POA: Diagnosis not present

## 2019-05-09 DIAGNOSIS — G4700 Insomnia, unspecified: Secondary | ICD-10-CM | POA: Diagnosis not present

## 2019-05-09 MED ORDER — METHYLPHENIDATE HCL 20 MG PO TABS: 20.0000 mg | ORAL_TABLET | Freq: Two times a day (BID) | ORAL | 0 refills | Status: DC

## 2019-05-09 NOTE — Telephone Encounter (Signed)
3 mo Botox inj  °

## 2019-05-09 NOTE — Progress Notes (Signed)
[facility]  PATIENT: Stacy Logan DOB: [birth year]  REFERRING DOCTOR OR PCP:  Annye Asa SOURCE: patient, EMR records from [facility].  MRI images on PACS  _________________________________   HISTORICAL  CHIEF COMPLAINT:  Chief Complaint  Patient presents with   Botulinum Toxin Injection    Rm 12. 100Ux1 vial. Lot: [id number]. Expiration: 08/2021. [place]: [id number]. B/B    HISTORY OF PRESENT ILLNESS:  Stacy Logan is a 53 y.o. 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   She also has migraine headaches    Update 05/09/2019: She is feeling much better than she has over the last couple of years.  She feels the MS has been stable.  She tolerates the Tecfidera and has no new neurologic symptoms.  Although she did get some bruising and ptosis after the last Botox injections, the hemifacial spasms completely resolved and the headaches were much better.   Additionally, the neck pain was much better.  Due to feeling better with less pain, her sleep improved and she felt that she was more functional during the day.  Mood is doing much better since starting Abilify at the last visit.  She feels the best she has psychologically in many years.  She feels that her apathy has resolved.  Update 02/01/2019: She is tolerating Tecfidera well for her MS.   She has no definite exacerbation but notes more bilateral facial dysesthesias.   Gait, strength and sensation are the same in her limbs. For spasticity, she takes baclofen (10 mg 1-2 times daily) and valium (5 mg 1-2 times daily)    For dysesthesias she is on gabapentin 300 mg po tid.   Fatigue is worse   Hemifacial spasms did well x 3-4 months after the last Botox but symptoms are much worse the last 2 months  Chronic migraines also did better x 3-4 months after the Botox but they are occurring daily again.   She is now experiencing the migraine headaches almost  every day.  She has associated mild nausea and photophobia and phonophobia.  Moving will worsen the pain.  She also has neck pain, right greater than left.  When pain is present the range of motion seems worse in her neck.  Mood is doing worse and she has more depression and anxiety.   She reports her sister has noticed a big change in her mood.  She often has a feeling of agitation.  She is currently on Effexor 187.5 mg daily.  She has never been on Abilify or a mood stabilizer.  She also has more hypersomnia.  She feels she sleeps up to 12 hours a day.  Ritalin has helped the hypersomnia but not every day.  Ritalin also helps her focus.  Update 08/07/18: The hemifacial spasms did very well x 2 1/2 months.  She is now just noticing mild spasms returning on the right.  She had mild eyelid drooping but it does not bother her much.   The eyelid drooping was less than the previous Botox areas  Her chronic migraines also did well x 2 1/2.   She has a more severe migraine right now that started yesterday.   Her neck pain has done better since the last visit.     Her MS is doing well on Tecfidera and she toleratres it well.   She denies having any exacerbations.  She feels gait, strength, sensation, coordination and bladder function are unchanged from her last visit.  She notes some fatigue but is doing better with the cooler weather.  Update 05/04/2018: Her right hemifacial spasms are much better since starting Botox.   She did note mild droping in her face at times and feels her smile is a lttle different.   Migraines are also better on Botox with only 1-2 a month now and only 1/2 are intense.  When she pulled on the refrigerator door 3 weeks ago, she had sudden electric like pain going down the arm into the 2nd, 3rd and 4th fingers.  She has significant degenerative changes and spinal stenosis at [facility] and C6C7.  Those images are from 2015.   There is movement artifact.  Images personally reviewed today in  Ms. Harmon presence.   She had C3-C7 ACDF in 2016  She is on Tecfidera and tolerates it well.    Her last differential sshowed lymphocyte count of 1.0  Update 01/30/2018: Her hemifacial spasms are doing better with Botox but the last 2 weeks they are starting to return.   She tolerated the last series of injections well and there was no ptosis following the injections as she experienced the first time.   Her migraines have also done better with Botox.   She now only has a few a month instead of almost any day.  She continues to have a lot of pain in her hands.    The worse pain is in her right middle finger.    Her MS is doing well on Tecfidera and she denies any exacerbation.   Gait is doing better and she no longer uses a cane.  Bladder has done better with Myrbetriq.    Mood is doing ok though still mild depression.  She still notes a fair amount of fatigue.     Her breast cancer remains in remission.   Update 10/26/2017: Her hemifacial spasms are doing better with Botox, she did have a little eyelid drooping on the right, though.   Spasms started to return in about 2 weeks ago.  She also has migraines a few times a week now a used to have daily headaches. For a couple months after Botox the migraines only occur once or twice a week..   With migraines she will get nausea and photophobia and phonophobia.     She is on Tecfidera and tolerating it well.  He felt bladder function only worsened several months ago. She had incontinence and saw urology.     She is on Myrbetriq.      She feels more fatigue and is sleeping poorly.   Fatigue occurs daily. Ritalin has helped a little bit. She notes some depression and anxiety. She has mild cognitive issues with short-term memory and word finding at times.  She is done with the treatments for her breast cancer.    From 06/09/2017: Hemifacial spasms/blepharospasm:    She feels that the right hemifacial spasms in her blepharospasm improved tremendously after  the last Botox therapy and the spasms have continued to do well the entire 3 months. Did not note any eyelid drooping with the last treatment course.   She tolerated it well and there were no complications. She does not note any difficulty with swallowing.  Migraine/Neck pain:  Her migraines also improved after the Botox.. At times the neck pain radiating to the right arm.Marland Kitchen EMG showed mild chronic C7 radiculopathy in the past.   Both hands have numbness.  MS:  She has no definite exacerbation though feels worse with  more fatigue.  She is remaining on Tecfidera and tolerates it well. She has not had any definite exacerbations since starting the medication.    Gait/strength/sensation/pain:  She fractured her left shoulder when she lost balance and fell.   She also broke her big toe during the fall.   The left leg does not have as much coordination as the right.   She also has a tight dysesthetic sensation in both feet and ankles and the arms have spasms.   Baclofen has helped the spasticity. However, makes her sleepy which limits the dose. She just takes at bedtime. Lamotrigine and gabapentin did not help.   Bladder:   She has urinary frequency and urgency with occasional incontinence. This is mostly stable.  Vision/eyes:   She feels her vision is stable with no new blurriness and no changes in color vision. No eye pain..  Fatigue/sleep:  She has a lot of fatigue much worse with heat.    Ritalin has only helped a little bit.    Mood/cognition:   She notes more depression with all the medical issues.   She is now on a higher dose of Effexor (150 mg daily. She also notes cognitive issues and poor focus.   Specifically, organizing is difficult.   She is forgetful and easily distracted but this is stable..   She has trouble following through with tasks.   Ritalin helps a little bit  Breast Ca:   She was diagnosed with breast cancer and had surgery and now is don with RadRx and chemo   She feels much more  tired since her diagnosis..     MS/spine history:   In 2001, she was noted to have left hearing loss and had an MRI of the brain.   She did not have numbness or weakness a that time.  She was told she likely had MS.   She saw a neurologist.  She never had an LP.   She reports she was told she had MS but was never started on any medication.   Starting about 5-6 years ago, she was noting more difficulty with gait and also noted some memory issues.   She was also having bladder issues with urgency and bladder incontinence x 5 years.      However, she had no health insurance at that time and did not follow up with neurology.   She saw Dr. Tomi Likens last year.   He ordered an MRI of the brian and cervical spine.   She had one enhancing lesion on the spine and one additional lesion.  An LP was ordered but she opted not to do the test.   She was started on tecfidera.   She has had some itching and flushing, especially if she does not take after food.     She also was found to have severe spinal stenosis and myelopathy.   She was sent to Dr. Arnoldo Morale of Neurosurgery.   She underwent  ACDF from C3-C7 06/2014.    She reports she is having more trouble with her arms since the surgery.   The worse pain is in the 2nd and 3rd fingers but she has altered sensation with some pain in the other fingers as well.   She also has right > left lower neck pain.  Bladder is about the same (maybe slightly better) as it was before surgery.      I have personally reviewed the MRI of the brain dated 01/12/2015 and the MRIs of  the cervical spine dated 02/01/2015 and 05/22/2014.   The MRI of the brain shows white matter foci predominantly in the deep white matter but also in the periventricular and subcortical white matter of both hemispheres. The brainstem appears normal. There is no significant atrophy. The MRI of the cervical spine from 2015 showed severe spinal stenosis at C5-C6 and milder spinal stenosis at C6-C7 and C4-C5. There is an  enhancing focus within the spinal cord adjacent to C5-C6 and she has another hyperintense focus at C3-C4. On the second MRI, done without contrast, she is status post C3-C7 ACDF.      REVIEW OF SYSTEMS: Constitutional: No fevers, chills, sweats, or change in appetite Eyes: as above Ear, nose and throat: No hearing loss, ear pain, nasal congestion, sore throat Cardiovascular: No chest pain, palpitations Respiratory: No shortness of breath at rest or with exertion.   No wheezes GastrointestinaI: No nausea, vomiting, diarrhea, abdominal pain, fecal incontinence Genitourinary: see above Musculoskeletal: Some neck pain, back pain Integumentary: No rash, pruritus, skin lesions Neurological: as above Psychiatric: Notes depression and anxiety Endocrine: No palpitations, diaphoresis, change in appetite, change in weigh or increased thirst Hematologic/Lymphatic: No anemia, purpura, petechiae. Allergic/Immunologic: No itchy/runny eyes, nasal congestion, recent allergic reactions, rashes  ALLERGIES: Allergies  Allergen Reactions   Codeine     Makes her "busy", itchy   Lamictal [Lamotrigine] Other (See Comments)    Mood changes    Meperidine Nausea And Vomiting   Meperidine Hcl Nausea And Vomiting    SEVERE N/V   Morphine Other (See Comments)    "SKIN CRAWLS"    HOME MEDICATIONS:  Current Outpatient Medications:    amitriptyline (ELAVIL) 25 MG tablet, TAKE 1-2 TABLETS (25-50 MG TOTAL) BY MOUTH AT BEDTIME., Disp: 180 tablet, Rfl: 3   ARIPiprazole (ABILIFY) 5 MG tablet, TAKE 1 TABLET BY MOUTH EVERY DAY, Disp: 90 tablet, Rfl: 4   baclofen (LIORESAL) 10 MG tablet, TAKE 1 TABLET BY MOUTH TWICE A DAY, Disp: 180 tablet, Rfl: 1   BOTOX 100 units SOLR injection, INJECT 100 UNITS  INTRAMUSCULARLY EVERY 3  MONTHS (GIVEN AT  PRESCRIBERS OFFICE, DISCARD UNUSED), Disp: 100 Units, Rfl: 1   diazepam (VALIUM) 5 MG tablet, TAKE 1 TAB BY MOUTH EVERY 8 HOURS AS NEEDED FOR SPASMS, Disp: 90  tablet, Rfl: 5   Dimethyl Fumarate (TECFIDERA) 240 MG CPDR, TAKE 1 CAPSULE (240MG ) BY MOUTH TWICE DAILY, Disp: 60 capsule, Rfl: 11   gabapentin (NEURONTIN) 300 MG capsule, TAKE 1 CAPSULE (300 MG TOTAL) BY MOUTH 3 (THREE) TIMES DAILY., Disp: 90 capsule, Rfl: 11   letrozole (FEMARA) 2.5 MG tablet, Take 1 tablet (2.5 mg total) by mouth daily., Disp: 90 tablet, Rfl: 3   methylphenidate (RITALIN) 20 MG tablet, Take 1 tablet (20 mg total) by mouth 2 (two) times daily with breakfast and lunch., Disp: 60 tablet, Rfl: 0   OnabotulinumtoxinA (BOTOX IM), Inject into the muscle. Facial to treat MS, Disp: , Rfl:    pantoprazole (PROTONIX) 40 MG tablet, Take 1 tablet (40 mg total) by mouth daily., Disp: 90 tablet, Rfl: 1   promethazine (PHENERGAN) 25 MG tablet, TAKE ONE TABLET BY MOUTH DAILY AS NEEDED FOR NAUSEA, Disp: 30 tablet, Rfl: 0   rizatriptan (MAXALT) 5 MG tablet, Take 1 tablet (5 mg total) by mouth as needed for migraine. May repeat in 2 hours if needed. No more than 2 tablets in 24 hr or 2-3 doses in a week., Disp: 10 tablet, Rfl: 5   valACYclovir (VALTREX) 1000  MG tablet, TAKE 2 TABLETS EVERY 12 HOURS X2 DOSES AS NEEDED FOR COLD SORES, Disp: 30 tablet, Rfl: 0   venlafaxine XR (EFFEXOR-XR) 75 MG 24 hr capsule, Take 1 capsule (75 mg total) by mouth daily with breakfast., Disp: 30 capsule, Rfl: 5  Current Facility-Administered Medications:    0.9 %  sodium chloride infusion, 500 mL, Intravenous, Continuous, Pyrtle, Lajuan Lines, MD  PAST MEDICAL HISTORY: Past Medical History:  Diagnosis Date   Anal fissure    Anxiety    Arthritis    Borderline diabetes    Cancer ([place])    left breast cancer   Depression    Frequency of urination    H/O cold sores    History of pneumothorax    1988-- SPONTANEOUS--  RESOLVED W/ CHEST TUBE   History of radiation therapy 07/29/16- 08/27/16   Left Breast 40.05 Gy in 15 fractions, Left Breast Boost 10 Gy in 5 fractions.    IC (interstitial cystitis)      Internal hemorrhoids    Lesion of bladder    Migraines    MS (multiple sclerosis) ([place])    MRI showed plague on her brain   Nocturia    Personal history of radiation therapy    Preeclampsia 1994   RSD (reflex sympathetic dystrophy)    Seasonal allergies    Tubular adenoma of colon    Urgency of urination     PAST SURGICAL HISTORY: Past Surgical History:  Procedure Laterality Date   ANTERIOR CERVICAL DECOMPRESSION/DISCECTOMY FUSION 4 LEVELS N/A 07/03/2014   Procedure: ANTERIOR CERVICAL DECOMPRESSION/DISCECTOMY FUSION 4 LEVELS;  Surgeon: Newman Pies, MD;  Location: [facility];  Service: Neurosurgery;  Laterality: N/A;  C3-4 C4-5 C5-6 C6-7 Anterior cervical decompression/diskectomy/fusion/interbody prosthesis/plate   APPENDECTOMY     CESAREAN SECTION  1994   CYSTOSCOPY WITH HYDRODISTENSION AND BIOPSY Bilateral 02/15/2014   Procedure: CYSTOSCOPY  BILATERAL RETROGRADE PYLOGRAM, HYDRODISTENSION, INSTILATION OF MARCAINE AND PYRIDIUM;  Surgeon: Ardis Hughs, MD;  [facility];  Service: Urology;  Laterality: Bilateral;   D & C HYSTEROSCOPY WITH POLYPECTOMY  12-02-2003   DILATION AND CURETTAGE OF UTERUS     DX LAPAROSCOPY/  FULGERATION ENDOMETRIOSIS/  APPENDECTOMY  1985   RADIOACTIVE SEED GUIDED PARTIAL MASTECTOMY WITH AXILLARY SENTINEL LYMPH NODE BIOPSY Left 06/07/2016   Procedure: BREAST LUMPECTOMY WITH RADIOACTIVE SEED AND SENTINEL LYMPH NODE BIOPSY, INJECT BLUE DYE LEFT BREAST;  Surgeon: Fanny Skates, MD;  Location: [facility];  Service: General;  Laterality: Left;   TONSILLECTOMY AND ADENOIDECTOMY  1972    FAMILY HISTORY: Family History  Problem Relation Age of Onset   Hypertension Mother    Diabetes Father    Prostate cancer Father        dx late 55s   Lung cancer Father 24       smoker   Cancer Father        cheek of mouth, dx. between 34 and 58   Diabetes Paternal Grandmother    Diabetes  Paternal Grandfather    Aneurysm Maternal Grandmother 12       d. brain aneurysm   Prostate cancer Maternal Grandfather    Diabetes Paternal Uncle        x 4   Diabetes Paternal Aunt    Colon cancer Maternal Uncle    Cancer Maternal Uncle        mouth cancer; +tobacco   Breast cancer Sister 48       s/p mastectomy  Breast cancer Maternal Aunt        dx 80s   Lymphoma Maternal Aunt 83   Uterine cancer Maternal Aunt        d. late 80s   Throat cancer Cousin        maternal 1st cousin; lim info   Cancer Paternal Uncle        NOS cancer   Breast cancer Cousin        paternal 1st cousin dx 19s   Esophageal cancer Neg Hx    Rectal cancer Neg Hx    Stomach cancer Neg Hx     SOCIAL HISTORY:  Social History   Socioeconomic History   Marital status: Single    Spouse name: Not on file   Number of children: 1   Years of education: Not on file   Highest education level: Not on file  Occupational History   Occupation: disabled  Social Designer, fashion/clothing strain: Not on file   Food insecurity    Worry: Not on file    Inability: Not on file   Transportation needs    Medical: Not on file    Non-medical: Not on file  Tobacco Use   Smoking status: Former Smoker    Packs/day: 0.25    Years: 27.00    Pack years: 6.75    Types: Cigarettes    Quit date: 01/17/2011    Years since quitting: 8.3   Smokeless tobacco: Never Used  Substance and Sexual Activity   Alcohol use: No   Drug use: No   Sexual activity: Never  Lifestyle   Physical activity    Days per week: Not on file    Minutes per session: Not on file   Stress: Not on file  Relationships   Social connections    Talks on phone: Not on file    Gets together: Not on file    Attends religious service: Not on file    Active member of club or organization: Not on file    Attends meetings of clubs or organizations: Not on file    Relationship status: Not on file   Intimate  partner violence    Fear of current or ex partner: Not on file    Emotionally abused: Not on file    Physically abused: Not on file    Forced sexual activity: Not on file  Other Topics Concern   Not on file  Social History Narrative   Not on file     PHYSICAL EXAM  Vitals:   05/09/19 1425  BP: 122/79  Pulse: (!) 102  Temp: (!) 96.2 F (35.7 C)  Weight: 118 lb 8 oz (53.8 kg)  Height: 5\' 10"  (1.778 m)    Body mass index is 17 kg/m.   General: The patient is a thin woman in no acute distress   Neurologic Exam  Mental status: The patient is alert and oriented x 3 at the time of the examination. The patient has apparent normal recent and remote memory, with an apparently normal attention span and concentration ability.   Speech is normal.  Cranial nerves:  Marland Kitchen There were some right hemifacial spasms and mild right ptosis.  Patient strength and sensation is normal.  Trapezius and sternocleidomastoid strength is normal. No dysarthria is noted.  The tongue is midline, and the patient has symmetric elevation of the soft palate. No obvious hearing deficits are noted.  Motor:  Muscle bulk is normal.   Muscle tone  is normal.  She has 4+/5 strength in the right triceps and the intrinsic hand muscles on the right.  Strength was normal in the legs.  Sensory: She has decreased sensation in the right arm.   '  Gait and station: Station is normal.   Gait is fairly normal but the tandem gait is wide.  Romberg is negative.    Reflexes: Deep tendon reflexes are increased in both legs with spread at the knees. There is no ankle clonus.Marland Kitchen       DIAGNOSTIC DATA (LABS, IMAGING, TESTING) - I reviewed patient records, labs, notes, testing and imaging myself where available.  Lab Results  Component Value Date   WBC 7.2 02/01/2019   HGB 13.1 02/01/2019   HCT 37.8 02/01/2019   MCV 88 02/01/2019   PLT 200 02/01/2019      Component Value Date/Time   NA 139 02/01/2019 1047   K 4.5  02/01/2019 1047   CL 100 02/01/2019 1047   CO2 24 02/01/2019 1047   GLUCOSE 107 (H) 02/01/2019 1047   GLUCOSE 94 06/03/2016 1500   BUN 9 02/01/2019 1047   CREATININE 0.67 02/01/2019 1047   CREATININE 0.55 12/26/2014 1042   CALCIUM 10.1 02/01/2019 1047   PROT 6.9 02/01/2019 1047   ALBUMIN 4.6 02/01/2019 1047   AST 18 02/01/2019 1047   ALT 14 02/01/2019 1047   ALKPHOS 85 02/01/2019 1047   BILITOT <0.2 02/01/2019 1047   GFRNONAA 99 02/01/2019 1047   GFRNONAA >89 12/26/2014 1042   GFRAA 114 02/01/2019 1047   GFRAA >89 12/26/2014 1042     __________________________________________________  BOTOX INJECTION  The risks and benefits of Botox injection brain to Mrs. Wadas. We discussed the possibility of ptosis as the most common side effect with injection for blepharospasm and hemifacial spasm.   The following injections were performed using sterile technique.  Right frontalis (2.5 units 2) Left frontalis (2.5 units 2) Procerus and corrugators (2 units 3) Lateral canthus (2.5 units x 2)                  Right zygomaticus (8 units) Right Buccinator (5 units)  Left Buccinator  (5 units x 1)  Temporalis/Occipitalis  (3 U x 6) 56 units injected into the muscles of the face/scalp  Splenius capitis (10 units 2) C3 paraspinal (6 units 2) C7 paraspinal (6 units 2)    44 U injected into the muscles of the neck   Total 100 units were injected.    ASSESSMENT AND PLAN    1. MS (multiple sclerosis) ([place])   2. Chronic migraine   3. Hemifacial spasm   4. Insomnia, unspecified type   5. Depression with anxiety      1.   Botox (100 units) was injected into the muscles of the face and neck as described above for hemifacial spasm and chronic migraine.  She tolerated the injections well and there were no complications. 2.   We will continue Tecfidera 240 mg twice a day for MS.     Check CBC with diff  3.    Continue Ritalin 20 mg 2 daily hypersomnia and MS fatigue.  Continue  Valium for spasticity and anxiety 4.   Continue Effexor and Abilify 5 mg daily.   5.  She will return in 3 months or sooner if there are new or worsening neurologic symptoms.    Gamaliel Charney A. Felecia Shelling, MD, PhD 1/74/0814, 4:81 PM Certified in Neurology, Clinical Neurophysiology, Sleep Medicine, Pain Medicine and Neuroimaging  [facility] [address] [place], [place] [address] [phone]

## 2019-05-10 ENCOUNTER — Telehealth: Payer: Self-pay | Admitting: *Deleted

## 2019-05-10 LAB — CBC WITH DIFFERENTIAL/PLATELET
Basophils Absolute: 0 10*3/uL (ref 0.0–0.2)
Basos: 0 %
EOS (ABSOLUTE): 0.1 10*3/uL (ref 0.0–0.4)
Eos: 1 %
Hematocrit: 38.5 % (ref 34.0–46.6)
Hemoglobin: 12.8 g/dL (ref 11.1–15.9)
Immature Grans (Abs): 0 10*3/uL (ref 0.0–0.1)
Immature Granulocytes: 0 %
Lymphocytes Absolute: 0.9 10*3/uL (ref 0.7–3.1)
Lymphs: 14 %
MCH: 29.6 pg (ref 26.6–33.0)
MCHC: 33.2 g/dL (ref 31.5–35.7)
MCV: 89 fL (ref 79–97)
Monocytes Absolute: 0.5 10*3/uL (ref 0.1–0.9)
Monocytes: 8 %
Neutrophils Absolute: 5.1 10*3/uL (ref 1.4–7.0)
Neutrophils: 77 %
Platelets: 201 10*3/uL (ref 150–450)
RBC: 4.32 x10E6/uL (ref 3.77–5.28)
RDW: 14.3 % (ref 11.7–15.4)
WBC: 6.7 10*3/uL (ref 3.4–10.8)

## 2019-05-10 NOTE — Telephone Encounter (Signed)
-----   Message from Britt Bottom, MD sent at 05/10/2019  5:06 PM EDT ----- Please let the patient know that the lab work is fine.

## 2019-05-10 NOTE — Telephone Encounter (Signed)
Called pt. Relayed results per Dr. Felecia Shelling. She verbalized understanding.

## 2019-05-17 NOTE — Telephone Encounter (Signed)
I called and scheduled the patient for her next injection. DW

## 2019-05-31 ENCOUNTER — Other Ambulatory Visit: Payer: Self-pay | Admitting: Neurology

## 2019-06-06 ENCOUNTER — Other Ambulatory Visit: Payer: Self-pay | Admitting: *Deleted

## 2019-06-06 DIAGNOSIS — G35 Multiple sclerosis: Secondary | ICD-10-CM

## 2019-06-06 MED ORDER — TECFIDERA 240 MG PO CPDR: DELAYED_RELEASE_CAPSULE | ORAL | 11 refills | Status: DC

## 2019-06-11 ENCOUNTER — Other Ambulatory Visit (HOSPITAL_COMMUNITY): Payer: Self-pay | Admitting: Obstetrics and Gynecology

## 2019-06-11 ENCOUNTER — Other Ambulatory Visit: Payer: Self-pay | Admitting: Obstetrics and Gynecology

## 2019-06-11 DIAGNOSIS — Z779 Other contact with and (suspected) exposures hazardous to health: Secondary | ICD-10-CM | POA: Diagnosis not present

## 2019-06-11 DIAGNOSIS — E041 Nontoxic single thyroid nodule: Secondary | ICD-10-CM

## 2019-07-02 ENCOUNTER — Other Ambulatory Visit: Payer: Self-pay | Admitting: Adult Health

## 2019-07-02 DIAGNOSIS — Z853 Personal history of malignant neoplasm of breast: Secondary | ICD-10-CM

## 2019-07-13 ENCOUNTER — Telehealth (HOSPITAL_COMMUNITY): Payer: Self-pay | Admitting: Neurology

## 2019-07-13 NOTE — Telephone Encounter (Signed)
I returned call from patient's sister who had called answering service stating she was having trouble picking up her medicine from pharmacy.  By the time I called pharmacy had sorted out the issue and patient had the medicine.

## 2019-07-14 ENCOUNTER — Other Ambulatory Visit: Payer: Self-pay | Admitting: Hematology and Oncology

## 2019-07-16 ENCOUNTER — Inpatient Hospital Stay: Payer: Medicare Other | Attending: Hematology and Oncology | Admitting: Hematology and Oncology

## 2019-07-16 NOTE — Assessment & Plan Note (Deleted)
Left lumpectomy 06/07/2016: IDC grade 1, 1.1 cm, DCIS intermediate grade, margins negative, 0/2 lymph nodes negative, T1c N0 stage IA  Genetic testing: Negative Oncotype DX score 22, 14% risk of recurrence with tamoxifen alone, intermediate risk Adjuvant radiation therapy 07/29/2016 to 08/27/2016  Treatment: Adjuvant antiestrogen therapy with anastrozole 1 mg by mouth daily startedJanuary 2018  Anastrozole toxicities: The first 2 weeks patient had mood swings. Since then she has been doing quite well. Denies any hot flashes or myalgias.  Surveillance:  1.  Breast exam 07/16/2019: Benign 2. Mammogram scheduled for 08/13/2019  3.  Bone density: T score -2 osteopenia Recommended calcium and vitamin D and Fosamax. Because she is having her teeth extracted I told her not to start Fosamax until 3 months after her teeth extraction.   Return to clinic in1 yearfor follow-up

## 2019-07-20 ENCOUNTER — Other Ambulatory Visit: Payer: Self-pay

## 2019-07-20 DIAGNOSIS — Z20822 Contact with and (suspected) exposure to covid-19: Secondary | ICD-10-CM

## 2019-07-21 LAB — NOVEL CORONAVIRUS, NAA: SARS-CoV-2, NAA: DETECTED — AB

## 2019-08-02 ENCOUNTER — Other Ambulatory Visit: Payer: Self-pay | Admitting: Neurology

## 2019-08-09 ENCOUNTER — Other Ambulatory Visit: Payer: Self-pay

## 2019-08-09 ENCOUNTER — Encounter: Payer: Self-pay | Admitting: Neurology

## 2019-08-09 ENCOUNTER — Ambulatory Visit (INDEPENDENT_AMBULATORY_CARE_PROVIDER_SITE_OTHER): Payer: Medicare Other | Admitting: Neurology

## 2019-08-09 VITALS — BP 127/85 | HR 84 | Temp 97.0°F | Ht 69.75 in | Wt 117.0 lb

## 2019-08-09 DIAGNOSIS — G35 Multiple sclerosis: Secondary | ICD-10-CM

## 2019-08-09 DIAGNOSIS — Z170 Estrogen receptor positive status [ER+]: Secondary | ICD-10-CM

## 2019-08-09 DIAGNOSIS — G5131 Clonic hemifacial spasm, right: Secondary | ICD-10-CM | POA: Diagnosis not present

## 2019-08-09 DIAGNOSIS — C50412 Malignant neoplasm of upper-outer quadrant of left female breast: Secondary | ICD-10-CM | POA: Diagnosis not present

## 2019-08-09 DIAGNOSIS — IMO0002 Reserved for concepts with insufficient information to code with codable children: Secondary | ICD-10-CM

## 2019-08-09 DIAGNOSIS — R269 Unspecified abnormalities of gait and mobility: Secondary | ICD-10-CM | POA: Diagnosis not present

## 2019-08-09 DIAGNOSIS — G43709 Chronic migraine without aura, not intractable, without status migrainosus: Secondary | ICD-10-CM | POA: Diagnosis not present

## 2019-08-09 NOTE — Progress Notes (Signed)
[facility]  PATIENT: Stacy Logan DOB: 1966-07-05  REFERRING DOCTOR OR PCP:  Annye Asa SOURCE: patient, EMR records from [facility].  MRI images on PACS  _________________________________   HISTORICAL  CHIEF COMPLAINT:  Chief Complaint  Patient presents with  . Botulinum Toxin Injection    Rm 13 alone     HISTORY OF PRESENT ILLNESS:  Stacy Logan is a 53 y.o. woman with multiple sclerosis who also has right hemifacial spasms and chronic migraine headaches    Update 08/09/2019: She feels her migraines and hemifacial spasms have both done very well over the last 3 months.  She has only had a few spasms the past couple weeks.  She also has only had a few migraines the past month.  She hardly ever needs to use Relpax anymore.  Neck pain is also doing better.    Her MS is doing well and she notes no new exacerbations.  She is on Tecfidera and tolerates it well.  Mood is doing well on Abilify and Effexor..  She also feels her fatigue has done well and she is sleeping well most nights.  She takes Ritalin some days (up to 20 mg twice daily) for hypersomnia, fatigue and attention deficit.  She reports her breast cancer is doing very well.  She continues on letrozole.  Update 05/09/2019: She is feeling much better than she has over the last couple of years.  She feels the MS has been stable.  She tolerates the Tecfidera and has no new neurologic symptoms.  Although she did get some bruising and ptosis after the last Botox injections, the hemifacial spasms completely resolved and the headaches were much better.   Additionally, the neck pain was much better.  Due to feeling better with less pain, her sleep improved and she felt that she was more functional during the day.  Mood is doing much better since starting Abilify at the last visit.  She feels the best she has psychologically in many years.  She feels that her apathy has resolved.  Update  02/01/2019: She is tolerating Tecfidera well for her MS.   She has no definite exacerbation but notes more bilateral facial dysesthesias.   Gait, strength and sensation are the same in her limbs. For spasticity, she takes baclofen (10 mg 1-2 times daily) and valium (5 mg 1-2 times daily)    For dysesthesias she is on gabapentin 300 mg po tid.   Fatigue is worse   Hemifacial spasms did well x 3-4 months after the last Botox but symptoms are much worse the last 2 months  Chronic migraines also did better x 3-4 months after the Botox but they are occurring daily again.   She is now experiencing the migraine headaches almost every day.  She has associated mild nausea and photophobia and phonophobia.  Moving will worsen the pain.  She also has neck pain, right greater than left.  When pain is present the range of motion seems worse in her neck.  Mood is doing worse and she has more depression and anxiety.   She reports her sister has noticed a big change in her mood.  She often has a feeling of agitation.  She is currently on Effexor 187.5 mg daily.  She has never been on Abilify or a mood stabilizer.  She also has more hypersomnia.  She feels she sleeps up to 12 hours a day.  Ritalin has helped the hypersomnia but not every day.  Ritalin also helps her focus.  Update 08/07/18: The hemifacial spasms did very well x 2 1/2 months.  She is now just noticing mild spasms returning on the right.  She had mild eyelid drooping but it does not bother her much.   The eyelid drooping was less than the previous Botox areas  Her chronic migraines also did well x 2 1/2.   She has a more severe migraine right now that started yesterday.   Her neck pain has done better since the last visit.     Her MS is doing well on Tecfidera and she toleratres it well.   She denies having any exacerbations.  She feels gait, strength, sensation, coordination and bladder function are unchanged from her last visit.  She notes some fatigue  but is doing better with the cooler weather.  Update 05/04/2018: Her right hemifacial spasms are much better since starting Botox.   She did note mild droping in her face at times and feels her smile is a lttle different.   Migraines are also better on Botox with only 1-2 a month now and only 1/2 are intense.  When she pulled on the refrigerator door 3 weeks ago, she had sudden electric like pain going down the arm into the 2nd, 3rd and 4th fingers.  She has significant degenerative changes and spinal stenosis at [facility] and C6C7.  Those images are from 2015.   There is movement artifact.  Images personally reviewed today in Ms. Stiglich presence.   She had C3-C7 ACDF in 2016  She is on Tecfidera and tolerates it well.    Her last differential sshowed lymphocyte count of 1.0  Update 01/30/2018: Her hemifacial spasms are doing better with Botox but the last 2 weeks they are starting to return.   She tolerated the last series of injections well and there was no ptosis following the injections as she experienced the first time.   Her migraines have also done better with Botox.   She now only has a few a month instead of almost any day.  She continues to have a lot of pain in her hands.    The worse pain is in her right middle finger.    Her MS is doing well on Tecfidera and she denies any exacerbation.   Gait is doing better and she no longer uses a cane.  Bladder has done better with Myrbetriq.    Mood is doing ok though still mild depression.  She still notes a fair amount of fatigue.     Her breast cancer remains in remission.   Update 10/26/2017: Her hemifacial spasms are doing better with Botox, she did have a little eyelid drooping on the right, though.   Spasms started to return in about 2 weeks ago.  She also has migraines a few times a week now a used to have daily headaches. For a couple months after Botox the migraines only occur once or twice a week..   With migraines she will get nausea and  photophobia and phonophobia.     She is on Tecfidera and tolerating it well.  He felt bladder function only worsened several months ago. She had incontinence and saw urology.     She is on Myrbetriq.      She feels more fatigue and is sleeping poorly.   Fatigue occurs daily. Ritalin has helped a little bit. She notes some depression and anxiety. She has mild cognitive issues with short-term memory and word finding  at times.  She is done with the treatments for her breast cancer.    From 06/09/2017: Hemifacial spasms/blepharospasm:    She feels that the right hemifacial spasms in her blepharospasm improved tremendously after the last Botox therapy and the spasms have continued to do well the entire 3 months. Did not note any eyelid drooping with the last treatment course.   She tolerated it well and there were no complications. She does not note any difficulty with swallowing.  Migraine/Neck pain:  Her migraines also improved after the Botox.. At times the neck pain radiating to the right arm.Marland Kitchen EMG showed mild chronic C7 radiculopathy in the past.   Both hands have numbness.  MS:  She has no definite exacerbation though feels worse with more fatigue.  She is remaining on Tecfidera and tolerates it well. She has not had any definite exacerbations since starting the medication.    Gait/strength/sensation/pain:  She fractured her left shoulder when she lost balance and fell.   She also broke her big toe during the fall.   The left leg does not have as much coordination as the right.   She also has a tight dysesthetic sensation in both feet and ankles and the arms have spasms.   Baclofen has helped the spasticity. However, makes her sleepy which limits the dose. She just takes at bedtime. Lamotrigine and gabapentin did not help.   Bladder:   She has urinary frequency and urgency with occasional incontinence. This is mostly stable.  Vision/eyes:   She feels her vision is stable with no new blurriness and  no changes in color vision. No eye pain..  Fatigue/sleep:  She has a lot of fatigue much worse with heat.    Ritalin has only helped a little bit.    Mood/cognition:   She notes more depression with all the medical issues.   She is now on a higher dose of Effexor (150 mg daily. She also notes cognitive issues and poor focus.   Specifically, organizing is difficult.   She is forgetful and easily distracted but this is stable..   She has trouble following through with tasks.   Ritalin helps a little bit  Breast Ca:   She was diagnosed with breast cancer and had surgery and now is don with RadRx and chemo   She feels much more tired since her diagnosis..     MS/spine history:   In 2001, she was noted to have left hearing loss and had an MRI of the brain.   She did not have numbness or weakness a that time.  She was told she likely had MS.   She saw a neurologist.  She never had an LP.   She reports she was told she had MS but was never started on any medication.   Starting about 5-6 years ago, she was noting more difficulty with gait and also noted some memory issues.   She was also having bladder issues with urgency and bladder incontinence x 5 years.      However, she had no health insurance at that time and did not follow up with neurology.   She saw Dr. Tomi Likens last year.   He ordered an MRI of the brian and cervical spine.   She had one enhancing lesion on the spine and one additional lesion.  An LP was ordered but she opted not to do the test.   She was started on tecfidera.   She has had some itching and  flushing, especially if she does not take after food.     She also was found to have severe spinal stenosis and myelopathy.   She was sent to Dr. Arnoldo Morale of Neurosurgery.   She underwent  ACDF from C3-C7 06/2014.    She reports she is having more trouble with her arms since the surgery.   The worse pain is in the 2nd and 3rd fingers but she has altered sensation with some pain in the other fingers as well.    She also has right > left lower neck pain.  Bladder is about the same (maybe slightly better) as it was before surgery.      I have personally reviewed the MRI of the brain dated 01/12/2015 and the MRIs of the cervical spine dated 02/01/2015 and 05/22/2014.   The MRI of the brain shows white matter foci predominantly in the deep white matter but also in the periventricular and subcortical white matter of both hemispheres. The brainstem appears normal. There is no significant atrophy. The MRI of the cervical spine from 2015 showed severe spinal stenosis at C5-C6 and milder spinal stenosis at C6-C7 and C4-C5. There is an enhancing focus within the spinal cord adjacent to C5-C6 and she has another hyperintense focus at C3-C4. On the second MRI, done without contrast, she is status post C3-C7 ACDF.      REVIEW OF SYSTEMS: Constitutional: No fevers, chills, sweats, or change in appetite Eyes: as above Ear, nose and throat: No hearing loss, ear pain, nasal congestion, sore throat Cardiovascular: No chest pain, palpitations Respiratory: No shortness of breath at rest or with exertion.   No wheezes GastrointestinaI: No nausea, vomiting, diarrhea, abdominal pain, fecal incontinence Genitourinary: see above Musculoskeletal: Some neck pain, back pain Integumentary: No rash, pruritus, skin lesions Neurological: as above Psychiatric: Notes depression and anxiety Endocrine: No palpitations, diaphoresis, change in appetite, change in weigh or increased thirst Hematologic/Lymphatic: No anemia, purpura, petechiae. Allergic/Immunologic: No itchy/runny eyes, nasal congestion, recent allergic reactions, rashes  ALLERGIES: Allergies  Allergen Reactions  . Codeine     Makes her "busy", itchy  . Lamictal [Lamotrigine] Other (See Comments)    Mood changes   . Meperidine Nausea And Vomiting  . Meperidine Hcl Nausea And Vomiting    SEVERE N/V  . Morphine Other (See Comments)    "SKIN CRAWLS"    HOME  MEDICATIONS:  Current Outpatient Medications:  .  amitriptyline (ELAVIL) 25 MG tablet, TAKE 1-2 TABLETS (25-50 MG TOTAL) BY MOUTH AT BEDTIME., Disp: 180 tablet, Rfl: 3 .  ARIPiprazole (ABILIFY) 5 MG tablet, TAKE 1 TABLET BY MOUTH EVERY DAY, Disp: 90 tablet, Rfl: 4 .  baclofen (LIORESAL) 10 MG tablet, TAKE 1 TABLET BY MOUTH TWICE A DAY, Disp: 180 tablet, Rfl: 1 .  BOTOX 100 units SOLR injection, INJECT 100 UNITS  INTRAMUSCULARLY EVERY 3  MONTHS (GIVEN AT  PRESCRIBERS OFFICE, DISCARD UNUSED), Disp: 100 Units, Rfl: 1 .  diazepam (VALIUM) 5 MG tablet, TAKE 1 TAB BY MOUTH EVERY 8 HOURS AS NEEDED FOR SPASMS, Disp: 90 tablet, Rfl: 5 .  gabapentin (NEURONTIN) 300 MG capsule, TAKE 1 CAPSULE (300 MG TOTAL) BY MOUTH 3 (THREE) TIMES DAILY., Disp: 90 capsule, Rfl: 11 .  letrozole (FEMARA) 2.5 MG tablet, TAKE 1 TABLET BY MOUTH EVERY DAY, Disp: 90 tablet, Rfl: 3 .  methylphenidate (RITALIN) 20 MG tablet, Take 1 tablet (20 mg total) by mouth 2 (two) times daily with breakfast and lunch., Disp: 60 tablet, Rfl: 0 .  OnabotulinumtoxinA (  BOTOX IM), Inject into the muscle. Facial to treat MS, Disp: , Rfl:  .  pantoprazole (PROTONIX) 40 MG tablet, Take 1 tablet (40 mg total) by mouth daily., Disp: 90 tablet, Rfl: 1 .  promethazine (PHENERGAN) 25 MG tablet, TAKE ONE TABLET BY MOUTH DAILY AS NEEDED FOR NAUSEA, Disp: 30 tablet, Rfl: 0 .  rizatriptan (MAXALT) 5 MG tablet, Take 1 tablet (5 mg total) by mouth as needed for migraine. May repeat in 2 hours if needed. No more than 2 tablets in 24 hr or 2-3 doses in a week., Disp: 10 tablet, Rfl: 5 .  TECFIDERA 240 MG CPDR, TAKE 1 CAPSULE (240MG ) BY MOUTH TWICE DAILY, Disp: 60 capsule, Rfl: 11 .  valACYclovir (VALTREX) 1000 MG tablet, TAKE 2 TABLETS EVERY 12 HOURS X2 DOSES AS NEEDED FOR COLD SORES, Disp: 30 tablet, Rfl: 0 .  venlafaxine XR (EFFEXOR-XR) 75 MG 24 hr capsule, Take 1 capsule (75 mg total) by mouth daily with breakfast., Disp: 30 capsule, Rfl: 5  Current  Facility-Administered Medications:  .  0.9 %  sodium chloride infusion, 500 mL, Intravenous, Continuous, Pyrtle, Lajuan Lines, MD  PAST MEDICAL HISTORY: Past Medical History:  Diagnosis Date  . Anal fissure   . Anxiety   . Arthritis   . Borderline diabetes   . Cancer [facility])    left breast cancer  . Depression   . Frequency of urination   . H/O cold sores   . History of pneumothorax    1988-- SPONTANEOUS--  RESOLVED W/ CHEST TUBE  . History of radiation therapy 07/29/16- 08/27/16   Left Breast 40.05 Gy in 15 fractions, Left Breast Boost 10 Gy in 5 fractions.   . IC (interstitial cystitis)   . Internal hemorrhoids   . Lesion of bladder   . Migraines   . MS (multiple sclerosis) ([place])    MRI showed plague on her brain  . Nocturia   . Personal history of radiation therapy   . Preeclampsia 1994  . RSD (reflex sympathetic dystrophy)   . Seasonal allergies   . Tubular adenoma of colon   . Urgency of urination     PAST SURGICAL HISTORY: Past Surgical History:  Procedure Laterality Date  . ANTERIOR CERVICAL DECOMPRESSION/DISCECTOMY FUSION 4 LEVELS N/A 07/03/2014   Procedure: ANTERIOR CERVICAL DECOMPRESSION/DISCECTOMY FUSION 4 LEVELS;  Surgeon: Newman Pies, MD;  Location: [facility];  Service: Neurosurgery;  Laterality: N/A;  C3-4 C4-5 C5-6 C6-7 Anterior cervical decompression/diskectomy/fusion/interbody prosthesis/plate  . APPENDECTOMY    . CESAREAN SECTION  1994  . CYSTOSCOPY WITH HYDRODISTENSION AND BIOPSY Bilateral 02/15/2014   Procedure: CYSTOSCOPY  BILATERAL RETROGRADE PYLOGRAM, HYDRODISTENSION, INSTILATION OF MARCAINE AND PYRIDIUM;  Surgeon: Ardis Hughs, MD;  Location: [facility];  Service: Urology;  Laterality: Bilateral;  . D & C HYSTEROSCOPY WITH POLYPECTOMY  12-02-2003  . DILATION AND CURETTAGE OF UTERUS    . DX LAPAROSCOPY/  FULGERATION ENDOMETRIOSIS/  APPENDECTOMY  1985  . RADIOACTIVE SEED GUIDED PARTIAL MASTECTOMY WITH AXILLARY SENTINEL LYMPH NODE  BIOPSY Left 06/07/2016   Procedure: BREAST LUMPECTOMY WITH RADIOACTIVE SEED AND SENTINEL LYMPH NODE BIOPSY, INJECT BLUE DYE LEFT BREAST;  Surgeon: Fanny Skates, MD;  Location: [place];  Service: General;  Laterality: Left;  . TONSILLECTOMY AND ADENOIDECTOMY  1972    FAMILY HISTORY: Family History  Problem Relation Age of Onset  . Hypertension Mother   . Diabetes Father   . Prostate cancer Father        dx late  42s  . Lung cancer Father 34       smoker  . Cancer Father        cheek of mouth, dx. between 87 and 59  . Diabetes Paternal Grandmother   . Diabetes Paternal Grandfather   . Aneurysm Maternal Grandmother 76       d. brain aneurysm  . Prostate cancer Maternal Grandfather   . Diabetes Paternal Uncle        x 4  . Diabetes Paternal Aunt   . Colon cancer Maternal Uncle   . Cancer Maternal Uncle        mouth cancer; +tobacco  . Breast cancer Sister 52       s/p mastectomy  . Breast cancer Maternal Aunt        dx 20s  . Lymphoma Maternal Aunt 83  . Uterine cancer Maternal Aunt        d. late 41s  . Throat cancer Cousin        maternal 1st cousin; lim info  . Cancer Paternal Uncle        NOS cancer  . Breast cancer Cousin        paternal 1st cousin dx 33s  . Esophageal cancer Neg Hx   . Rectal cancer Neg Hx   . Stomach cancer Neg Hx     SOCIAL HISTORY:  Social History   Socioeconomic History  . Marital status: Single    Spouse name: Not on file  . Number of children: 1  . Years of education: Not on file  . Highest education level: Not on file  Occupational History  . Occupation: disabled  Social Needs  . Financial resource strain: Not on file  . Food insecurity    Worry: Not on file    Inability: Not on file  . Transportation needs    Medical: Not on file    Non-medical: Not on file  Tobacco Use  . Smoking status: Former Smoker    Packs/day: 0.25    Years: 27.00    Pack years: 6.75    Types: Cigarettes    Quit date: 01/17/2011     Years since quitting: 8.5  . Smokeless tobacco: Never Used  Substance and Sexual Activity  . Alcohol use: No  . Drug use: No  . Sexual activity: Never  Lifestyle  . Physical activity    Days per week: Not on file    Minutes per session: Not on file  . Stress: Not on file  Relationships  . Social Herbalist on phone: Not on file    Gets together: Not on file    Attends religious service: Not on file    Active member of club or organization: Not on file    Attends meetings of clubs or organizations: Not on file    Relationship status: Not on file  . Intimate partner violence    Fear of current or ex partner: Not on file    Emotionally abused: Not on file    Physically abused: Not on file    Forced sexual activity: Not on file  Other Topics Concern  . Not on file  Social History Narrative  . Not on file     PHYSICAL EXAM  Vitals:   08/09/19 1605  BP: 127/85  Pulse: 84  Temp: (!) 97 F (36.1 C)  TempSrc: Temporal  Weight: 117 lb (53.1 kg)  Height: 5' 9.75" (1.772 m)    Body mass index  is 16.91 kg/m.   General: The patient is a thin woman in no acute distress   Neurologic Exam  Mental status: The patient is alert and oriented x 3 at the time of the examination. The patient has apparent normal recent and remote memory, with an apparently normal attention span and concentration ability.   Speech is normal.  Cranial nerves:  Marland Kitchen  She has no hemifacial spasms during today's visit.  There is no ptosis.  Neck strength is fine.  Facial strength and sensation is normal.  Trapezius and sternocleidomastoid strength is normal. No dysarthria is noted.  Hearing is normal and symmetric.  Motor:  Muscle bulk is normal.   Muscle tone is normal.  She has 4+/5 strength in the right triceps and the intrinsic hand muscles on the right.  Strength was normal in the legs.  Sensory: She has decreased sensation in the right arm.   '  Gait and station: Station is normal.    Gait is fairly normal.  Tandem gait is wide.  Romberg is negative.    Reflexes: Deep tendon reflexes are increased in both legs with spread at the knees. There is no ankle clonus.Marland Kitchen       DIAGNOSTIC DATA (LABS, IMAGING, TESTING) - I reviewed patient records, labs, notes, testing and imaging myself where available.  Lab Results  Component Value Date   WBC 6.7 05/09/2019   HGB 12.8 05/09/2019   HCT 38.5 05/09/2019   MCV 89 05/09/2019   PLT 201 05/09/2019      Component Value Date/Time   NA 139 02/01/2019 1047   K 4.5 02/01/2019 1047   CL 100 02/01/2019 1047   CO2 24 02/01/2019 1047   GLUCOSE 107 (H) 02/01/2019 1047   GLUCOSE 94 06/03/2016 1500   BUN 9 02/01/2019 1047   CREATININE 0.67 02/01/2019 1047   CREATININE 0.55 12/26/2014 1042   CALCIUM 10.1 02/01/2019 1047   PROT 6.9 02/01/2019 1047   ALBUMIN 4.6 02/01/2019 1047   AST 18 02/01/2019 1047   ALT 14 02/01/2019 1047   ALKPHOS 85 02/01/2019 1047   BILITOT <0.2 02/01/2019 1047   GFRNONAA 99 02/01/2019 1047   GFRNONAA >89 12/26/2014 1042   GFRAA 114 02/01/2019 1047   GFRAA >89 12/26/2014 1042     __________________________________________________  BOTOX INJECTION  The risks and benefits of Botox injection brain to Mrs. Lavigne. We discussed the possibility of ptosis as the most common side effect with injection for blepharospasm and hemifacial spasm.   The following injections were performed using sterile technique.  Right frontalis (2.5 units 2) Left frontalis (2.5 units 2) Procerus and corrugators (2 units 3) Lateral canthus (2.5 units x 2)                  Right zygomaticus (10 units) Right Buccinator (5 units)  Left Buccinator  (5 units x 1)  Temporalis/Occipitalis  (5 U x 8) 81 units injected into the muscles of the face/scalp  Splenius capitis (10 units 2) Splenius capitis (10 units 2) C3 paraspinal (6 units 2) C7 paraspinal (6 units 2)    Trapezius (10 U x 2) 74 U injected into the muscles of  the neck   Total 155 units were injected. (45 U wasted)    ASSESSMENT AND PLAN    1. Chronic migraine   2. MS (multiple sclerosis) ([place])   3. Hemifacial spasm of right side of face   4. Malignant neoplasm of upper-outer quadrant of left breast in female,  estrogen receptor positive ([place])   5. Gait disturbance      1.   Botox (155 units) was injected into the muscles of the face and neck as described above for hemifacial spasm and chronic migraine.  She tolerated the injections well and there were no complications.  45 units wasted. 2.   We will continue Tecfidera 240 mg twice a day for MS.     3.   Continue other medications including Ritalin 20 mg 2 for hypersomnia and MS fatigue.  Continue Valium for spasticity and anxiety 4.   Continue Effexor and Abilify 5 mg daily.   5.  She will return in 3 months or sooner if there are new or worsening neurologic symptoms.    Richard A. Felecia Shelling, MD, PhD [id number], [id number] PM Certified in Neurology, Clinical Neurophysiology, Sleep Medicine, Pain Medicine and Neuroimaging  [facility] [address] [place], [place] [address] [phone] Botox  One 200 unit vial used  LOT [id number] EXP 04/2022  [facility] [phone]

## 2019-08-13 ENCOUNTER — Other Ambulatory Visit: Payer: Self-pay

## 2019-08-13 ENCOUNTER — Ambulatory Visit
Admission: RE | Admit: 2019-08-13 | Discharge: 2019-08-13 | Disposition: A | Discharge status: Home / Self Care | Payer: Medicare Other | Source: Ambulatory Visit | Attending: Adult Health | Admitting: Adult Health

## 2019-08-13 DIAGNOSIS — R922 Inconclusive mammogram: Secondary | ICD-10-CM | POA: Diagnosis not present

## 2019-08-13 DIAGNOSIS — H524 Presbyopia: Secondary | ICD-10-CM | POA: Diagnosis not present

## 2019-08-13 DIAGNOSIS — G35 Multiple sclerosis: Secondary | ICD-10-CM | POA: Diagnosis not present

## 2019-08-13 DIAGNOSIS — H2513 Age-related nuclear cataract, bilateral: Secondary | ICD-10-CM | POA: Diagnosis not present

## 2019-08-13 DIAGNOSIS — Z853 Personal history of malignant neoplasm of breast: Secondary | ICD-10-CM

## 2019-08-13 DIAGNOSIS — H04123 Dry eye syndrome of bilateral lacrimal glands: Secondary | ICD-10-CM | POA: Diagnosis not present

## 2019-08-21 ENCOUNTER — Other Ambulatory Visit: Payer: Self-pay | Admitting: Neurology

## 2019-10-09 ENCOUNTER — Telehealth: Payer: Self-pay | Admitting: *Deleted

## 2019-10-09 NOTE — Telephone Encounter (Signed)
Called pt. Advised we received fax from [employer] that they have been unable to reach her to refill her Tecfidera. She states she has about 1/2 bottle left of Tecfidera. She will call them back in the morning at [phone]. I did give her a heads up that some insurances are no longer covering brand name d/t there being generic available now. She will also ask about this. If a change is needed, she will call me back tomorrow.

## 2019-12-26 ENCOUNTER — Telehealth: Payer: Self-pay | Admitting: Neurology

## 2019-12-26 NOTE — Telephone Encounter (Signed)
Patient called to schedule next botox apt

## 2020-01-01 NOTE — Telephone Encounter (Signed)
I have tried without success to reach patient to schedule.  No answer and the mailbox is full.

## 2020-01-04 NOTE — Telephone Encounter (Signed)
Pt is asking to be called back to schedule her Botox

## 2020-01-14 ENCOUNTER — Other Ambulatory Visit: Payer: Self-pay | Admitting: Neurology

## 2020-01-14 DIAGNOSIS — R1310 Dysphagia, unspecified: Secondary | ICD-10-CM

## 2020-01-14 DIAGNOSIS — G35 Multiple sclerosis: Secondary | ICD-10-CM

## 2020-01-14 DIAGNOSIS — R634 Abnormal weight loss: Secondary | ICD-10-CM | POA: Diagnosis not present

## 2020-01-15 ENCOUNTER — Other Ambulatory Visit: Payer: Self-pay

## 2020-01-15 ENCOUNTER — Ambulatory Visit (INDEPENDENT_AMBULATORY_CARE_PROVIDER_SITE_OTHER): Payer: Medicare Other | Admitting: Neurology

## 2020-01-15 ENCOUNTER — Encounter: Payer: Self-pay | Admitting: Neurology

## 2020-01-15 VITALS — BP 121/78 | HR 85 | Temp 98.2°F | Ht 69.75 in | Wt 112.0 lb

## 2020-01-15 DIAGNOSIS — Z170 Estrogen receptor positive status [ER+]: Secondary | ICD-10-CM

## 2020-01-15 DIAGNOSIS — G4700 Insomnia, unspecified: Secondary | ICD-10-CM

## 2020-01-15 DIAGNOSIS — IMO0002 Reserved for concepts with insufficient information to code with codable children: Secondary | ICD-10-CM

## 2020-01-15 DIAGNOSIS — C50412 Malignant neoplasm of upper-outer quadrant of left female breast: Secondary | ICD-10-CM

## 2020-01-15 DIAGNOSIS — G35 Multiple sclerosis: Secondary | ICD-10-CM | POA: Diagnosis not present

## 2020-01-15 DIAGNOSIS — Z79899 Other long term (current) drug therapy: Secondary | ICD-10-CM | POA: Diagnosis not present

## 2020-01-15 DIAGNOSIS — G43709 Chronic migraine without aura, not intractable, without status migrainosus: Secondary | ICD-10-CM | POA: Diagnosis not present

## 2020-01-15 DIAGNOSIS — R208 Other disturbances of skin sensation: Secondary | ICD-10-CM

## 2020-01-15 DIAGNOSIS — G5131 Clonic hemifacial spasm, right: Secondary | ICD-10-CM | POA: Diagnosis not present

## 2020-01-15 MED ORDER — METHYLPHENIDATE HCL 20 MG PO TABS: 20.0000 mg | ORAL_TABLET | Freq: Two times a day (BID) | ORAL | 0 refills | Status: DC

## 2020-01-15 NOTE — Progress Notes (Signed)
[facility]  PATIENT: Stacy Logan DOB: [birth year]  REFERRING DOCTOR OR PCP:  Annye Asa SOURCE: patient, EMR records from [facility].  MRI images on PACS  _________________________________   HISTORICAL  CHIEF COMPLAINT:  Chief Complaint  Patient presents with  . Follow-up    RM 12, alone.  Last seen 08/09/2019. She has no apetite, she is concerned about her weight loss. Has to force herself to eat.   . Botulinum Toxin Injection    Botox [id number]. LotRN:[id number]. Expiration: 08/2022. [place]: [id number].  . Multiple Sclerosis    Takes Tecfidera.  . mood    Takes effexor, abilify  . Fatigue    Takes Ritalin  . Spasms/anxiety    Takes diazepam    HISTORY OF PRESENT ILLNESS:  Stacy Logan is a 54 y.o. 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   She also has migraine headaches    Update 01/15/2020: Her right hemifacial spasms and the chronic migraines have both done well on Botox.   She has tolerated it well.    She has noted more symptoms the last mointh but is overdue.    MS is stable on Tecfidera and she tolerates to well.   No recent bloodwork   She has mild left > right leg weakness and spasticity.    She has some dysesthesias.  Bladder function is fine,.   She has some fatigue but better than last year.   Sleep is better.     Her mood has done better on Effexor and Abilify.   She sleeps well most nights.   She will be having her second Covid-19 vaccination this weekend.     Update 05/09/2019: She is feeling much better than she has over the last couple of years.  She feels the MS has been stable.  She tolerates the Tecfidera and has no new neurologic symptoms.  Although she did get some bruising and ptosis after the last Botox injections, the hemifacial spasms completely resolved and the headaches were much better.   Additionally, the neck pain was much better.  Due to feeling better  with less pain, her sleep improved and she felt that she was more functional during the day.  Mood is doing much better since starting Abilify at the last visit.  She feels the best she has psychologically in many years.  She feels that her apathy has resolved.  Update 02/01/2019: She is tolerating Tecfidera well for her MS.   She has no definite exacerbation but notes more bilateral facial dysesthesias.   Gait, strength and sensation are the same in her limbs. For spasticity, she takes baclofen (10 mg 1-2 times daily) and valium (5 mg 1-2 times daily)    For dysesthesias she is on gabapentin 300 mg po tid.   Fatigue is worse   Hemifacial spasms did well x 3-4 months after the last Botox but symptoms are much worse the last 2 months  Chronic migraines also did better x 3-4 months after the Botox but they are occurring daily again.   She is now experiencing the migraine headaches almost every day.  She has associated mild nausea and photophobia and phonophobia.  Moving will worsen the pain.  She also has neck pain, right greater than left.  When pain is present the range of motion seems worse in her neck.  Mood is doing worse and she has more depression and anxiety.   She reports her sister  has noticed a big change in her mood.  She often has a feeling of agitation.  She is currently on Effexor 187.5 mg daily.  She has never been on Abilify or a mood stabilizer.  She also has more hypersomnia.  She feels she sleeps up to 12 hours a day.  Ritalin has helped the hypersomnia but not every day.  Ritalin also helps her focus.  Update 08/07/18: The hemifacial spasms did very well x 2 1/2 months.  She is now just noticing mild spasms returning on the right.  She had mild eyelid drooping but it does not bother her much.   The eyelid drooping was less than the previous Botox areas  Her chronic migraines also did well x 2 1/2.   She has a more severe migraine right now that started yesterday.   Her neck pain has  done better since the last visit.     Her MS is doing well on Tecfidera and she toleratres it well.   She denies having any exacerbations.  She feels gait, strength, sensation, coordination and bladder function are unchanged from her last visit.  She notes some fatigue but is doing better with the cooler weather.  Update 05/04/2018: Her right hemifacial spasms are much better since starting Botox.   She did note mild droping in her face at times and feels her smile is a lttle different.   Migraines are also better on Botox with only 1-2 a month now and only 1/2 are intense.  When she pulled on the refrigerator door 3 weeks ago, she had sudden electric like pain going down the arm into the 2nd, 3rd and 4th fingers.  She has significant degenerative changes and spinal stenosis at [facility] and C6C7.  Those images are from 2015.   There is movement artifact.  Images personally reviewed today in Ms. Dryer presence.   She had C3-C7 ACDF in 2016  She is on Tecfidera and tolerates it well.    Her last differential sshowed lymphocyte count of 1.0  Update 01/30/2018: Her hemifacial spasms are doing better with Botox but the last 2 weeks they are starting to return.   She tolerated the last series of injections well and there was no ptosis following the injections as she experienced the first time.   Her migraines have also done better with Botox.   She now only has a few a month instead of almost any day.  She continues to have a lot of pain in her hands.    The worse pain is in her right middle finger.    Her MS is doing well on Tecfidera and she denies any exacerbation.   Gait is doing better and she no longer uses a cane.  Bladder has done better with Myrbetriq.    Mood is doing ok though still mild depression.  She still notes a fair amount of fatigue.     Her breast cancer remains in remission.   Update 10/26/2017: Her hemifacial spasms are doing better with Botox, she did have a little eyelid drooping on  the right, though.   Spasms started to return in about 2 weeks ago.  She also has migraines a few times a week now a used to have daily headaches. For a couple months after Botox the migraines only occur once or twice a week..   With migraines she will get nausea and photophobia and phonophobia.     She is on Tecfidera and tolerating it well.  He felt bladder function only worsened several months ago. She had incontinence and saw urology.     She is on Myrbetriq.      She feels more fatigue and is sleeping poorly.   Fatigue occurs daily. Ritalin has helped a little bit. She notes some depression and anxiety. She has mild cognitive issues with short-term memory and word finding at times.  She is done with the treatments for her breast cancer.    From 06/09/2017: Hemifacial spasms/blepharospasm:    She feels that the right hemifacial spasms in her blepharospasm improved tremendously after the last Botox therapy and the spasms have continued to do well the entire 3 months. Did not note any eyelid drooping with the last treatment course.   She tolerated it well and there were no complications. She does not note any difficulty with swallowing.  Migraine/Neck pain:  Her migraines also improved after the Botox.. At times the neck pain radiating to the right arm.Marland Kitchen EMG showed mild chronic C7 radiculopathy in the past.   Both hands have numbness.  MS:  She has no definite exacerbation though feels worse with more fatigue.  She is remaining on Tecfidera and tolerates it well. She has not had any definite exacerbations since starting the medication.    Gait/strength/sensation/pain:  She fractured her left shoulder when she lost balance and fell.   She also broke her big toe during the fall.   The left leg does not have as much coordination as the right.   She also has a tight dysesthetic sensation in both feet and ankles and the arms have spasms.   Baclofen has helped the spasticity. However, makes her sleepy which  limits the dose. She just takes at bedtime. Lamotrigine and gabapentin did not help.   Bladder:   She has urinary frequency and urgency with occasional incontinence. This is mostly stable.  Vision/eyes:   She feels her vision is stable with no new blurriness and no changes in color vision. No eye pain..  Fatigue/sleep:  She has a lot of fatigue much worse with heat.    Ritalin has only helped a little bit.    Mood/cognition:   She notes more depression with all the medical issues.   She is now on a higher dose of Effexor (150 mg daily. She also notes cognitive issues and poor focus.   Specifically, organizing is difficult.   She is forgetful and easily distracted but this is stable..   She has trouble following through with tasks.   Ritalin helps a little bit  Breast Ca:   She was diagnosed with breast cancer and had surgery and now is don with RadRx and chemo   She feels much more tired since her diagnosis..     MS/spine history:   In 2001, she was noted to have left hearing loss and had an MRI of the brain.   She did not have numbness or weakness a that time.  She was told she likely had MS.   She saw a neurologist.  She never had an LP.   She reports she was told she had MS but was never started on any medication.   Starting about 5-6 years ago, she was noting more difficulty with gait and also noted some memory issues.   She was also having bladder issues with urgency and bladder incontinence x 5 years.      However, she had no health insurance at that time and did not follow up  with neurology.   She saw Dr. Tomi Likens last year.   He ordered an MRI of the brian and cervical spine.   She had one enhancing lesion on the spine and one additional lesion.  An LP was ordered but she opted not to do the test.   She was started on tecfidera.   She has had some itching and flushing, especially if she does not take after food.     She also was found to have severe spinal stenosis and myelopathy.   She was sent to  Dr. Arnoldo Morale of Neurosurgery.   She underwent  ACDF from C3-C7 06/2014.    She reports she is having more trouble with her arms since the surgery.   The worse pain is in the 2nd and 3rd fingers but she has altered sensation with some pain in the other fingers as well.   She also has right > left lower neck pain.  Bladder is about the same (maybe slightly better) as it was before surgery.      I have personally reviewed the MRI of the brain dated 01/12/2015 and the MRIs of the cervical spine dated 02/01/2015 and 05/22/2014.   The MRI of the brain shows white matter foci predominantly in the deep white matter but also in the periventricular and subcortical white matter of both hemispheres. The brainstem appears normal. There is no significant atrophy. The MRI of the cervical spine from 2015 showed severe spinal stenosis at C5-C6 and milder spinal stenosis at C6-C7 and C4-C5. There is an enhancing focus within the spinal cord adjacent to C5-C6 and she has another hyperintense focus at C3-C4. On the second MRI, done without contrast, she is status post C3-C7 ACDF.      REVIEW OF SYSTEMS: Constitutional: No fevers, chills, sweats, or change in appetite Eyes: as above Ear, nose and throat: No hearing loss, ear pain, nasal congestion, sore throat Cardiovascular: No chest pain, palpitations Respiratory: No shortness of breath at rest or with exertion.   No wheezes GastrointestinaI: No nausea, vomiting, diarrhea, abdominal pain, fecal incontinence Genitourinary: see above Musculoskeletal: Some neck pain, back pain Integumentary: No rash, pruritus, skin lesions Neurological: as above Psychiatric: Notes depression and anxiety Endocrine: No palpitations, diaphoresis, change in appetite, change in weigh or increased thirst Hematologic/Lymphatic: No anemia, purpura, petechiae. Allergic/Immunologic: No itchy/runny eyes, nasal congestion, recent allergic reactions, rashes  ALLERGIES: Allergies  Allergen  Reactions  . Codeine     Makes her "busy", itchy  . Lamictal [Lamotrigine] Other (See Comments)    Mood changes   . Meperidine Nausea And Vomiting  . Meperidine Hcl Nausea And Vomiting    SEVERE N/V  . Morphine Other (See Comments)    "SKIN CRAWLS"    HOME MEDICATIONS:  Current Outpatient Medications:  .  amitriptyline (ELAVIL) 25 MG tablet, TAKE 1-2 TABLETS (25-50 MG TOTAL) BY MOUTH AT BEDTIME., Disp: 180 tablet, Rfl: 3 .  ARIPiprazole (ABILIFY) 5 MG tablet, TAKE 1 TABLET BY MOUTH EVERY DAY, Disp: 90 tablet, Rfl: 4 .  baclofen (LIORESAL) 10 MG tablet, TAKE 1 TABLET BY MOUTH TWICE A DAY, Disp: 180 tablet, Rfl: 3 .  BOTOX 100 units SOLR injection, INJECT 100 UNITS  INTRAMUSCULARLY EVERY 3  MONTHS (GIVEN AT  PRESCRIBERS OFFICE, DISCARD UNUSED), Disp: 100 Units, Rfl: 1 .  diazepam (VALIUM) 5 MG tablet, TAKE 1 TAB BY MOUTH EVERY 8 HOURS AS NEEDED FOR SPASMS, Disp: 90 tablet, Rfl: 2 .  gabapentin (NEURONTIN) 300 MG capsule, TAKE 1 CAPSULE (  300 MG TOTAL) BY MOUTH 3 (THREE) TIMES DAILY., Disp: 90 capsule, Rfl: 11 .  letrozole (FEMARA) 2.5 MG tablet, TAKE 1 TABLET BY MOUTH EVERY DAY, Disp: 90 tablet, Rfl: 3 .  methylphenidate (RITALIN) 20 MG tablet, Take 1 tablet (20 mg total) by mouth 2 (two) times daily with breakfast and lunch., Disp: 60 tablet, Rfl: 0 .  OnabotulinumtoxinA (BOTOX IM), Inject into the muscle. Facial to treat MS, Disp: , Rfl:  .  pantoprazole (PROTONIX) 40 MG tablet, Take 1 tablet (40 mg total) by mouth daily., Disp: 90 tablet, Rfl: 1 .  promethazine (PHENERGAN) 25 MG tablet, TAKE ONE TABLET BY MOUTH DAILY AS NEEDED FOR NAUSEA, Disp: 30 tablet, Rfl: 0 .  rizatriptan (MAXALT) 5 MG tablet, Take 1 tablet (5 mg total) by mouth as needed for migraine. May repeat in 2 hours if needed. No more than 2 tablets in 24 hr or 2-3 doses in a week., Disp: 10 tablet, Rfl: 5 .  TECFIDERA 240 MG CPDR, TAKE 1 CAPSULE (240MG ) BY MOUTH TWICE DAILY, Disp: 60 capsule, Rfl: 11 .  valACYclovir  (VALTREX) 1000 MG tablet, TAKE 2 TABLETS EVERY 12 HOURS X2 DOSES AS NEEDED FOR COLD SORES, Disp: 30 tablet, Rfl: 0 .  venlafaxine XR (EFFEXOR-XR) 75 MG 24 hr capsule, Take 1 capsule (75 mg total) by mouth daily with breakfast., Disp: 30 capsule, Rfl: 5  Current Facility-Administered Medications:  .  0.9 %  sodium chloride infusion, 500 mL, Intravenous, Continuous, Pyrtle, Lajuan Lines, MD  PAST MEDICAL HISTORY: Past Medical History:  Diagnosis Date  . Anal fissure   . Anxiety   . Arthritis   . Borderline diabetes   . Cancer [facility])    left breast cancer  . Depression   . Frequency of urination   . H/O cold sores   . History of pneumothorax    1988-- SPONTANEOUS--  RESOLVED W/ CHEST TUBE  . History of radiation therapy 07/29/16- 08/27/16   Left Breast 40.05 Gy in 15 fractions, Left Breast Boost 10 Gy in 5 fractions.   . IC (interstitial cystitis)   . Internal hemorrhoids   . Lesion of bladder   . Migraines   . MS (multiple sclerosis) ([place])    MRI showed plague on her brain  . Nocturia   . Personal history of radiation therapy   . Preeclampsia 1994  . RSD (reflex sympathetic dystrophy)   . Seasonal allergies   . Tubular adenoma of colon   . Urgency of urination     PAST SURGICAL HISTORY: Past Surgical History:  Procedure Laterality Date  . ANTERIOR CERVICAL DECOMPRESSION/DISCECTOMY FUSION 4 LEVELS N/A 07/03/2014   Procedure: ANTERIOR CERVICAL DECOMPRESSION/DISCECTOMY FUSION 4 LEVELS;  Surgeon: Newman Pies, MD;  Location: [place] [facility];  Service: Neurosurgery;  Laterality: N/A;  C3-4 C4-5 C5-6 C6-7 Anterior cervical decompression/diskectomy/fusion/interbody prosthesis/plate  . APPENDECTOMY    . CESAREAN SECTION  1994  . CYSTOSCOPY WITH HYDRODISTENSION AND BIOPSY Bilateral 02/15/2014   Procedure: CYSTOSCOPY  BILATERAL RETROGRADE PYLOGRAM, HYDRODISTENSION, INSTILATION OF MARCAINE AND PYRIDIUM;  Surgeon: Ardis Hughs, MD;  Location: [facility];  Service: Urology;   Laterality: Bilateral;  . D & C HYSTEROSCOPY WITH POLYPECTOMY  12-02-2003  . DILATION AND CURETTAGE OF UTERUS    . DX LAPAROSCOPY/  FULGERATION ENDOMETRIOSIS/  APPENDECTOMY  1985  . RADIOACTIVE SEED GUIDED PARTIAL MASTECTOMY WITH AXILLARY SENTINEL LYMPH NODE BIOPSY Left 06/07/2016   Procedure: BREAST LUMPECTOMY WITH RADIOACTIVE SEED AND SENTINEL LYMPH NODE BIOPSY, INJECT BLUE  DYE LEFT BREAST;  Surgeon: Fanny Skates, MD;  Location: [place];  Service: General;  Laterality: Left;  . TONSILLECTOMY AND ADENOIDECTOMY  1972    FAMILY HISTORY: Family History  Problem Relation Age of Onset  . Hypertension Mother   . Diabetes Father   . Prostate cancer Father        dx late 60s  . Lung cancer Father 11       smoker  . Cancer Father        cheek of mouth, dx. between 55 and 70  . Diabetes Paternal Grandmother   . Diabetes Paternal Grandfather   . Aneurysm Maternal Grandmother 76       d. brain aneurysm  . Prostate cancer Maternal Grandfather   . Diabetes Paternal Uncle        x 4  . Diabetes Paternal Aunt   . Colon cancer Maternal Uncle   . Cancer Maternal Uncle        mouth cancer; +tobacco  . Breast cancer Sister 31       s/p mastectomy  . Breast cancer Maternal Aunt        dx 83s  . Lymphoma Maternal Aunt 83  . Uterine cancer Maternal Aunt        d. late 73s  . Throat cancer Cousin        maternal 1st cousin; lim info  . Cancer Paternal Uncle        NOS cancer  . Breast cancer Cousin        paternal 1st cousin dx 17s  . Esophageal cancer Neg Hx   . Rectal cancer Neg Hx   . Stomach cancer Neg Hx     SOCIAL HISTORY:  Social History   Socioeconomic History  . Marital status: Single    Spouse name: Not on file  . Number of children: 1  . Years of education: Not on file  . Highest education level: Not on file  Occupational History  . Occupation: disabled  Tobacco Use  . Smoking status: Former Smoker    Packs/day: 0.25    Years: 27.00    Pack  years: 6.75    Types: Cigarettes    Quit date: 01/17/2011    Years since quitting: 9.0  . Smokeless tobacco: Never Used  Substance and Sexual Activity  . Alcohol use: No  . Drug use: No  . Sexual activity: Never  Other Topics Concern  . Not on file  Social History Narrative  . Not on file   Social Determinants of Health   Financial Resource Strain:   . Difficulty of Paying Living Expenses:   Food Insecurity:   . Worried About Charity fundraiser in the Last Year:   . Arboriculturist in the Last Year:   Transportation Needs:   . Film/video editor (Medical):   Marland Kitchen Lack of Transportation (Non-Medical):   Physical Activity:   . Days of Exercise per Week:   . Minutes of Exercise per Session:   Stress:   . Feeling of Stress :   Social Connections:   . Frequency of Communication with Friends and Family:   . Frequency of Social Gatherings with Friends and Family:   . Attends Religious Services:   . Active Member of Clubs or Organizations:   . Attends Archivist Meetings:   Marland Kitchen Marital Status:   Intimate Partner Violence:   . Fear of Current or Ex-Partner:   . Emotionally  Abused:   Marland Kitchen Physically Abused:   . Sexually Abused:      PHYSICAL EXAM  Vitals:   01/15/20 1355  BP: 121/78  Pulse: 85  Temp: 98.2 F (36.8 C)  Weight: 112 lb (50.8 kg)  Height: 5' 9.75" (1.772 m)    Body mass index is 16.19 kg/m.   General: The patient is a thin woman in no acute distress   Neurologic Exam  Mental status: The patient is alert and oriented x 3 at the time of the examination. The patient has apparent normal recent and remote memory, with an apparently normal attention span and concentration ability.   Speech is normal.  Cranial nerves:  Marland Kitchen No hemifacial spasms noted.  No ptosis.  Facial strength and sensation is normal.  Trapezius and sternocleidomastoid strength is normal. No dysarthria is noted.  The tongue is midline, and the patient has symmetric elevation of the  soft palate. No obvious hearing deficits are noted.  Motor:  Muscle bulk is normal.   Muscle tone is normal.  She has 4+/5 strength in the right triceps and the intrinsic hand muscles on the right.  Strength was normal in the legs.  Sensory: She has decreased sensation in the right arm.   '  Gait and station: Station is normal.   Gait is fairly normal but the tandem gait is wide.  Romberg is negative  Reflexes: Deep tendon reflexes are increased in both legs with spread at the knees. There is no ankle clonus.Marland Kitchen       DIAGNOSTIC DATA (LABS, IMAGING, TESTING) - I reviewed patient records, labs, notes, testing and imaging myself where available.  Lab Results  Component Value Date   WBC 6.7 05/09/2019   HGB 12.8 05/09/2019   HCT 38.5 05/09/2019   MCV 89 05/09/2019   PLT 201 05/09/2019      Component Value Date/Time   NA 139 02/01/2019 1047   K 4.5 02/01/2019 1047   CL 100 02/01/2019 1047   CO2 24 02/01/2019 1047   GLUCOSE 107 (H) 02/01/2019 1047   GLUCOSE 94 06/03/2016 1500   BUN 9 02/01/2019 1047   CREATININE 0.67 02/01/2019 1047   CREATININE 0.55 12/26/2014 1042   CALCIUM 10.1 02/01/2019 1047   PROT 6.9 02/01/2019 1047   ALBUMIN 4.6 02/01/2019 1047   AST 18 02/01/2019 1047   ALT 14 02/01/2019 1047   ALKPHOS 85 02/01/2019 1047   BILITOT <0.2 02/01/2019 1047   GFRNONAA 99 02/01/2019 1047   GFRNONAA >89 12/26/2014 1042   GFRAA 114 02/01/2019 1047   GFRAA >89 12/26/2014 1042     __________________________________________________  BOTOX INJECTION  The risks and benefits of Botox injection brain to Mrs. Norling. We discussed the possibility of ptosis as the most common side effect with injection for blepharospasm and hemifacial spasm.   The following injections were performed using sterile technique.  Right frontalis (3 units 2) Left frontalis (3 units 2) Procerus and corrugators (3 units 3) Lateral canthus (2.5 units x 2)                  Right zygomaticus (10  units) Right Buccinator (5 units)  Left Buccinator  (5 units )  Temporalis/Occipitalis  (3 U x 8) 70 units injected into the muscles of the face/scalp  Splenius capitis (10 units 2) C3 paraspinal (10 units 2) C7 paraspinal (10 units 2)    Trapezius (15 U x 2) 80 U injected into the muscles of the neck  Total 160 units were injected and 40 wasted    ASSESSMENT AND PLAN    1. MS (multiple sclerosis) ([place])   2. High risk medication use   3. Chronic migraine   4. Hemifacial spasm of right side of face   5. Malignant neoplasm of upper-outer quadrant of left breast in female, estrogen receptor positive ([place])   6. Insomnia, unspecified type   7. Dysesthesia      1.   Botox (150 units) was injected into the muscles of the face and neck as described above for hemifacial spasm and chronic migraine.  She tolerated the injections well and there were no complications. 2.   ContinueTecfidera 240 mg twice a day for MS.     Check CBC with diff  3.    Continue Ritalin 20 mg 2 daily hypersomnia and MS fatigue.  Continue Valium for spasticity and anxiety 4.   Continue Effexor and Abilify 5 mg daily.  She is now seeing psychiatry and med's may change to try to increase appetite 5.  She will return in 3 months or sooner if there are new or worsening neurologic symptoms.    Zuleyka Kloc A. Felecia Shelling, MD, PhD [id number], [id number] PM Certified in Neurology, Clinical Neurophysiology, Sleep Medicine, Pain Medicine and Neuroimaging  [facility] [address] [place], [place] [address] [phone]

## 2020-01-16 LAB — CBC WITH DIFFERENTIAL/PLATELET
Basophils Absolute: 0 10*3/uL (ref 0.0–0.2)
Basos: 0 %
EOS (ABSOLUTE): 0.1 10*3/uL (ref 0.0–0.4)
Eos: 1 %
Hematocrit: 36.4 % (ref 34.0–46.6)
Hemoglobin: 12.4 g/dL (ref 11.1–15.9)
Immature Grans (Abs): 0 10*3/uL (ref 0.0–0.1)
Immature Granulocytes: 0 %
Lymphocytes Absolute: 0.8 10*3/uL (ref 0.7–3.1)
Lymphs: 18 %
MCH: 30 pg (ref 26.6–33.0)
MCHC: 34.1 g/dL (ref 31.5–35.7)
MCV: 88 fL (ref 79–97)
Monocytes Absolute: 0.5 10*3/uL (ref 0.1–0.9)
Monocytes: 10 %
Neutrophils Absolute: 3.2 10*3/uL (ref 1.4–7.0)
Neutrophils: 71 %
Platelets: 248 10*3/uL (ref 150–450)
RBC: 4.14 x10E6/uL (ref 3.77–5.28)
RDW: 13.6 % (ref 11.7–15.4)
WBC: 4.6 10*3/uL (ref 3.4–10.8)

## 2020-02-11 ENCOUNTER — Other Ambulatory Visit: Payer: Self-pay | Admitting: Neurology

## 2020-03-08 ENCOUNTER — Other Ambulatory Visit: Payer: Self-pay | Admitting: Neurology

## 2020-04-08 ENCOUNTER — Telehealth: Payer: Self-pay | Admitting: Neurology

## 2020-04-08 NOTE — Telephone Encounter (Signed)
Patient has Botox appointment on 7/28. I called [employer] [phone]) and spoke with Stanton Kidney to see if PA is required for [phone] and [phone]. She states PA is not required. Reference #[id number].

## 2020-04-16 ENCOUNTER — Ambulatory Visit (INDEPENDENT_AMBULATORY_CARE_PROVIDER_SITE_OTHER): Payer: Medicare Other | Admitting: Neurology

## 2020-04-16 ENCOUNTER — Encounter: Payer: Self-pay | Admitting: Neurology

## 2020-04-16 VITALS — BP 111/73 | HR 114 | Ht 69.75 in | Wt 115.0 lb

## 2020-04-16 DIAGNOSIS — G43709 Chronic migraine without aura, not intractable, without status migrainosus: Secondary | ICD-10-CM

## 2020-04-16 DIAGNOSIS — G5131 Clonic hemifacial spasm, right: Secondary | ICD-10-CM

## 2020-04-16 DIAGNOSIS — F418 Other specified anxiety disorders: Secondary | ICD-10-CM

## 2020-04-16 DIAGNOSIS — G35 Multiple sclerosis: Secondary | ICD-10-CM | POA: Diagnosis not present

## 2020-04-16 DIAGNOSIS — Z79899 Other long term (current) drug therapy: Secondary | ICD-10-CM

## 2020-04-16 DIAGNOSIS — C50412 Malignant neoplasm of upper-outer quadrant of left female breast: Secondary | ICD-10-CM

## 2020-04-16 DIAGNOSIS — IMO0002 Reserved for concepts with insufficient information to code with codable children: Secondary | ICD-10-CM

## 2020-04-16 DIAGNOSIS — G5620 Lesion of ulnar nerve, unspecified upper limb: Secondary | ICD-10-CM

## 2020-04-16 DIAGNOSIS — M5412 Radiculopathy, cervical region: Secondary | ICD-10-CM

## 2020-04-16 DIAGNOSIS — R208 Other disturbances of skin sensation: Secondary | ICD-10-CM

## 2020-04-16 DIAGNOSIS — Z170 Estrogen receptor positive status [ER+]: Secondary | ICD-10-CM

## 2020-04-16 MED ORDER — METHYLPHENIDATE HCL 20 MG PO TABS: 20.0000 mg | ORAL_TABLET | Freq: Two times a day (BID) | ORAL | 0 refills | Status: DC

## 2020-04-16 NOTE — Progress Notes (Signed)
[facility]  PATIENT: Stacy Logan DOB: 1965/12/19  REFERRING DOCTOR OR PCP:  Annye Asa SOURCE: patient, EMR records from [facility].  MRI images on PACS  _________________________________   HISTORICAL  CHIEF COMPLAINT:  Chief Complaint  Patient presents with  . Follow-up    Here alone for Botox. Last seen 01/15/2020.  Marland Kitchen Botulinum Toxin Injection    200Ux1 vial. FXT:[id number]  Exp:12/2022   NDC: 5329-9242-68  . Multiple Sclerosis    On Tecfidera.  Left hand has some fingers that have lost function/painful. Normally right hand is the only one that bothers her. Has "bands" around calves that started yesterday. Legs feel heavy, harder to walk. Denies any bladder or vision issues.     HISTORY OF PRESENT ILLNESS:  Stacy Logan is a 54 y.o. 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   She also has migraine headaches    Update 04/16/2020: She reports that the headaches and the hemifacial spasms are generally doing well and she had no difficulties with the last series of Botox injections.  Specifically, there was no eyelid drooping or weakness elsewhere.  She is yet started to note that the hemifacial spasms have come back a few times this week.     MS is stable on Tecfidera and she tolerates to well.   Lymphocyte counts were 0.8 01/15/2020.  She is noting more numbness and weakness in both hands.  The numbness is in the fourth and fifth fingers greater than the third fingers and the weakness is in the intrinsic hand muscles.  Of note, she has had cervical fusion from C4-C7.     She has mild left > right leg weakness and spasticity.    She has some dysesthesias.  Bladder function is fine,.   She has some fatigue but better than last year.   Her mood has done well on Abilify, Effexor and diazepam.    She sleeps well most nights.   Ritalin has helped her focus/attention and fatigue   Breast Ca:   She  was diagnosed with breast cancer and had surgery, RadRx and chemo.  She remains in remission. MS/spine history:   In 2001, she was noted to have left hearing loss and had an MRI of the brain.   She did not have numbness or weakness a that time.  She was told she likely had MS.   She saw a neurologist.  She never had an LP.   She reports she was told she had MS but was never started on any medication.   Starting about 5-6 years ago, she was noting more difficulty with gait and also noted some memory issues.   She was also having bladder issues with urgency and bladder incontinence x 5 years.      However, she had no health insurance at that time and did not follow up with neurology.   She saw Dr. Tomi Likens last year.   He ordered an MRI of the brian and cervical spine.   She had one enhancing lesion on the spine and one additional lesion.  An LP was ordered but she opted not to do the test.   She was started on tecfidera.   She has had some itching and flushing, especially if she does not take after food.     She also was found to have severe spinal stenosis and myelopathy.   She was sent to Dr. Arnoldo Morale of Neurosurgery.   She  underwent  ACDF from C3-C7 06/2014.    She reports she is having more trouble with her arms since the surgery.   The worse pain is in the 2nd and 3rd fingers but she has altered sensation with some pain in the other fingers as well.   She also has right > left lower neck pain.  Bladder is about the same (maybe slightly better) as it was before surgery.      MRI images:  MRI of the brain dated 01/12/2015 and the MRIs of the cervical spine dated 02/01/2015 and 05/22/2014 were reviewed.   The MRI of the brain shows white matter foci predominantly in the deep white matter but also in the periventricular and subcortical white matter of both hemispheres. The brainstem appears normal. There is no significant atrophy. The MRI of the cervical spine from 2015 showed severe spinal stenosis at C5-C6 and  milder spinal stenosis at C6-C7 and C4-C5. There is an enhancing focus within the spinal cord adjacent to C5-C6 and she has another hyperintense focus at C3-C4. On the second MRI, done without contrast, she is status post C3-C7 ACDF.     05/13/2018 MRI of the brain and cervical spine: The MRI of the brain shows multiple T2/FLAIR hyperintense foci in the hemispheres and in the pons.  The overall extent is similar to what was noted on the 01/12/2015 MRI.  Although some foci are consistent with multiple sclerosis, others would be more likely due to chronic microvascular ischemic changes.  None of the lesions appear to be acute.  MRI of the cervical spine shows T2 hyperintense foci within the spinal cord centrally adjacent to C3 and adjacent to C4-C5.  There are bilateral hyperintense foci, right greater than left and possibly within the anterior horns, adjacent to C6.  The C3 and C4-C5 foci are nonspecific and could be due to demyelination but could also be the sequela of compressive myelopathy.  The changes at C5-C6 are more consistent with the sequela of compressive myelopathy.   REVIEW OF SYSTEMS: Constitutional: No fevers, chills, sweats, or change in appetite Eyes: as above Ear, nose and throat: No hearing loss, ear pain, nasal congestion, sore throat Cardiovascular: No chest pain, palpitations Respiratory: No shortness of breath at rest or with exertion.   No wheezes GastrointestinaI: No nausea, vomiting, diarrhea, abdominal pain, fecal incontinence Genitourinary: see above Musculoskeletal: Some neck pain, back pain Integumentary: No rash, pruritus, skin lesions Neurological: as above Psychiatric: Notes depression and anxiety Endocrine: No palpitations, diaphoresis, change in appetite, change in weigh or increased thirst Hematologic/Lymphatic: No anemia, purpura, petechiae. Allergic/Immunologic: No itchy/runny eyes, nasal congestion, recent allergic reactions, rashes  ALLERGIES: Allergies    Allergen Reactions  . Codeine     Makes her "busy", itchy  . Lamictal [Lamotrigine] Other (See Comments)    Mood changes   . Meperidine Nausea And Vomiting  . Meperidine Hcl Nausea And Vomiting    SEVERE N/V  . Morphine Other (See Comments)    "SKIN CRAWLS"    HOME MEDICATIONS:  Current Outpatient Medications:  .  amitriptyline (ELAVIL) 25 MG tablet, TAKE 1-2 TABLETS (25-50 MG TOTAL) BY MOUTH AT BEDTIME., Disp: 180 tablet, Rfl: 3 .  ARIPiprazole (ABILIFY) 5 MG tablet, TAKE 1 TABLET BY MOUTH EVERY DAY, Disp: 90 tablet, Rfl: 4 .  baclofen (LIORESAL) 10 MG tablet, TAKE 1 TABLET BY MOUTH TWICE A DAY, Disp: 180 tablet, Rfl: 3 .  BOTOX 100 units SOLR injection, INJECT 100 UNITS  INTRAMUSCULARLY EVERY 3  MONTHS (GIVEN AT  PRESCRIBERS OFFICE, DISCARD UNUSED), Disp: 100 Units, Rfl: 1 .  diazepam (VALIUM) 5 MG tablet, TAKE 1 TAB BY MOUTH EVERY 8 HOURS AS NEEDED FOR SPASMS, Disp: 90 tablet, Rfl: 2 .  gabapentin (NEURONTIN) 300 MG capsule, TAKE 1 CAPSULE (300 MG TOTAL) BY MOUTH 3 (THREE) TIMES DAILY., Disp: 270 capsule, Rfl: 3 .  letrozole (FEMARA) 2.5 MG tablet, TAKE 1 TABLET BY MOUTH EVERY DAY, Disp: 90 tablet, Rfl: 3 .  methylphenidate (RITALIN) 20 MG tablet, Take 1 tablet (20 mg total) by mouth 2 (two) times daily with breakfast and lunch., Disp: 60 tablet, Rfl: 0 .  OnabotulinumtoxinA (BOTOX IM), Inject into the muscle. Facial to treat MS, Disp: , Rfl:  .  pantoprazole (PROTONIX) 40 MG tablet, Take 1 tablet (40 mg total) by mouth daily., Disp: 90 tablet, Rfl: 1 .  promethazine (PHENERGAN) 25 MG tablet, TAKE ONE TABLET BY MOUTH DAILY AS NEEDED FOR NAUSEA, Disp: 30 tablet, Rfl: 0 .  rizatriptan (MAXALT) 5 MG tablet, 1 TAB AS NEEDED FOR MIGRAINE. MAY REPEAT IN 2 HRS IF NEEDED. MAX 2 TABS IN24HR OR 2-3 DOSES IN A WK., Disp: 10 tablet, Rfl: 5 .  TECFIDERA 240 MG CPDR, TAKE 1 CAPSULE (240MG ) BY MOUTH TWICE DAILY, Disp: 60 capsule, Rfl: 11 .  valACYclovir (VALTREX) 1000 MG tablet, TAKE 2 TABLETS  EVERY 12 HOURS X2 DOSES AS NEEDED FOR COLD SORES, Disp: 30 tablet, Rfl: 0 .  venlafaxine XR (EFFEXOR-XR) 75 MG 24 hr capsule, Take 1 capsule (75 mg total) by mouth daily with breakfast., Disp: 30 capsule, Rfl: 5  Current Facility-Administered Medications:  .  0.9 %  sodium chloride infusion, 500 mL, Intravenous, Continuous, Pyrtle, Lajuan Lines, MD  PAST MEDICAL HISTORY: Past Medical History:  Diagnosis Date  . Anal fissure   . Anxiety   . Arthritis   . Borderline diabetes   . Cancer [facility])    left breast cancer  . Depression   . Frequency of urination   . H/O cold sores   . History of pneumothorax    1988-- SPONTANEOUS--  RESOLVED W/ CHEST TUBE  . History of radiation therapy 07/29/16- 08/27/16   Left Breast 40.05 Gy in 15 fractions, Left Breast Boost 10 Gy in 5 fractions.   . IC (interstitial cystitis)   . Internal hemorrhoids   . Lesion of bladder   . Migraines   . MS (multiple sclerosis) ([place])    MRI showed plague on her brain  . Nocturia   . Personal history of radiation therapy   . Preeclampsia 1994  . RSD (reflex sympathetic dystrophy)   . Seasonal allergies   . Tubular adenoma of colon   . Urgency of urination     PAST SURGICAL HISTORY: Past Surgical History:  Procedure Laterality Date  . ANTERIOR CERVICAL DECOMPRESSION/DISCECTOMY FUSION 4 LEVELS N/A 07/03/2014   Procedure: ANTERIOR CERVICAL DECOMPRESSION/DISCECTOMY FUSION 4 LEVELS;  Surgeon: Newman Pies, MD;  Location: [facility];  Service: Neurosurgery;  Laterality: N/A;  C3-4 C4-5 C5-6 C6-7 Anterior cervical decompression/diskectomy/fusion/interbody prosthesis/plate  . APPENDECTOMY    . CESAREAN SECTION  1994  . CYSTOSCOPY WITH HYDRODISTENSION AND BIOPSY Bilateral 02/15/2014   Procedure: CYSTOSCOPY  BILATERAL RETROGRADE PYLOGRAM, HYDRODISTENSION, INSTILATION OF MARCAINE AND PYRIDIUM;  Surgeon: Ardis Hughs, MD;  Location: [facility];  Service: Urology;  Laterality: Bilateral;  . D & C  HYSTEROSCOPY WITH POLYPECTOMY  12-02-2003  . DILATION AND CURETTAGE OF UTERUS    . DX LAPAROSCOPY/  FULGERATION ENDOMETRIOSIS/  APPENDECTOMY  1985  . RADIOACTIVE SEED GUIDED PARTIAL MASTECTOMY WITH AXILLARY SENTINEL LYMPH NODE BIOPSY Left 06/07/2016   Procedure: BREAST LUMPECTOMY WITH RADIOACTIVE SEED AND SENTINEL LYMPH NODE BIOPSY, INJECT BLUE DYE LEFT BREAST;  Surgeon: Fanny Skates, MD;  Location: [facility];  Service: General;  Laterality: Left;  . TONSILLECTOMY AND ADENOIDECTOMY  1972    FAMILY HISTORY: Family History  Problem Relation Age of Onset  . Hypertension Mother   . Diabetes Father   . Prostate cancer Father        dx late 31s  . Lung cancer Father 50       smoker  . Cancer Father        cheek of mouth, dx. between 54 and 31  . Diabetes Paternal Grandmother   . Diabetes Paternal Grandfather   . Aneurysm Maternal Grandmother 76       d. brain aneurysm  . Prostate cancer Maternal Grandfather   . Diabetes Paternal Uncle        x 4  . Diabetes Paternal Aunt   . Colon cancer Maternal Uncle   . Cancer Maternal Uncle        mouth cancer; +tobacco  . Breast cancer Sister 62       s/p mastectomy  . Breast cancer Maternal Aunt        dx 60s  . Lymphoma Maternal Aunt 83  . Uterine cancer Maternal Aunt        d. late 37s  . Throat cancer Cousin        maternal 1st cousin; lim info  . Cancer Paternal Uncle        NOS cancer  . Breast cancer Cousin        paternal 1st cousin dx 10s  . Esophageal cancer Neg Hx   . Rectal cancer Neg Hx   . Stomach cancer Neg Hx     SOCIAL HISTORY:  Social History   Socioeconomic History  . Marital status: Single    Spouse name: Not on file  . Number of children: 1  . Years of education: Not on file  . Highest education level: Not on file  Occupational History  . Occupation: disabled  Tobacco Use  . Smoking status: Former Smoker    Packs/day: 0.25    Years: 27.00    Pack years: 6.75    Types: Cigarettes     Quit date: 01/17/2011    Years since quitting: 9.2  . Smokeless tobacco: Never Used  Vaping Use  . Vaping Use: Never used  Substance and Sexual Activity  . Alcohol use: No  . Drug use: No  . Sexual activity: Never  Other Topics Concern  . Not on file  Social History Narrative  . Not on file   Social Determinants of Health   Financial Resource Strain:   . Difficulty of Paying Living Expenses:   Food Insecurity:   . Worried About Charity fundraiser in the Last Year:   . Arboriculturist in the Last Year:   Transportation Needs:   . Film/video editor (Medical):   Marland Kitchen Lack of Transportation (Non-Medical):   Physical Activity:   . Days of Exercise per Week:   . Minutes of Exercise per Session:   Stress:   . Feeling of Stress :   Social Connections:   . Frequency of Communication with Friends and Family:   . Frequency of Social Gatherings with Friends and Family:   .  Attends Religious Services:   . Active Member of Clubs or Organizations:   . Attends Archivist Meetings:   Marland Kitchen Marital Status:   Intimate Partner Violence:   . Fear of Current or Ex-Partner:   . Emotionally Abused:   Marland Kitchen Physically Abused:   . Sexually Abused:      PHYSICAL EXAM  Vitals:   04/16/20 1453  BP: 111/73  Pulse: (!) 114  SpO2: 96%  Weight: 115 lb (52.2 kg)  Height: 5' 9.75" (1.772 m)    Body mass index is 16.62 kg/m.   General: The patient is a thin woman in no acute distress   Neurologic Exam  Mental status: The patient is alert and oriented x 3 at the time of the examination. The patient has apparent normal recent and remote memory, with an apparently normal attention span and concentration ability.   Speech is normal.  Cranial nerves:  Marland Kitchen No hemifacial spasms noted.  No ptosis.  Facial strength and sensation is normal.  Trapezius and sternocleidomastoid strength is normal. No dysarthria is noted.  The tongue is midline, and the patient has symmetric elevation of the soft  palate. No obvious hearing deficits are noted.  Motor:  Muscle bulk is normal.   Muscle tone is normal.  She has 4+/5 strength in the right triceps and the intrinsic hand muscles on the right.  Strength was normal in the legs.  Sensory: She has decreased sensation in the right arm.   '  Gait and station: Station is normal.   Gait is fairly normal but the tandem gait is wide.  Romberg is negative  Reflexes: Deep tendon reflexes are increased in both legs with spread at the knees. There is no ankle clonus.Marland Kitchen       DIAGNOSTIC DATA (LABS, IMAGING, TESTING) - I reviewed patient records, labs, notes, testing and imaging myself where available.  Lab Results  Component Value Date   WBC 4.6 01/15/2020   HGB 12.4 01/15/2020   HCT 36.4 01/15/2020   MCV 88 01/15/2020   PLT 248 01/15/2020      Component Value Date/Time   NA 139 02/01/2019 1047   K 4.5 02/01/2019 1047   CL 100 02/01/2019 1047   CO2 24 02/01/2019 1047   GLUCOSE 107 (H) 02/01/2019 1047   GLUCOSE 94 06/03/2016 1500   BUN 9 02/01/2019 1047   CREATININE 0.67 02/01/2019 1047   CREATININE 0.55 12/26/2014 1042   CALCIUM 10.1 02/01/2019 1047   PROT 6.9 02/01/2019 1047   ALBUMIN 4.6 02/01/2019 1047   AST 18 02/01/2019 1047   ALT 14 02/01/2019 1047   ALKPHOS 85 02/01/2019 1047   BILITOT <0.2 02/01/2019 1047   GFRNONAA 99 02/01/2019 1047   GFRNONAA >89 12/26/2014 1042   GFRAA 114 02/01/2019 1047   GFRAA >89 12/26/2014 1042     __________________________________________________  BOTOX INJECTION  The risks and benefits of Botox injection brain to Mrs. Kanady. We discussed the possibility of ptosis as the most common side effect with injection for blepharospasm and hemifacial spasm.   The following injections were performed using sterile technique.  Right frontalis (3 units 2) Left frontalis (3 units 2) Procerus and corrugators (3 units 3) Lateral canthus (2.5 units x 2)                  Right zygomaticus (10  units) Right Buccinator (5 units)  Left Buccinator  (5 units )  Temporalis/Occipitalis  (3 U x 10) 76 units injected into  the muscles of the face/scalp  Splenius capitis (12.5 units 2) C3 paraspinal (12.5 units 2) C7 paraspinal (10 units 2)    Trapezius (15 U x 2) 100 U injected into the muscles of the neck   Total 176 units were injected and 24 wasted    ASSESSMENT AND PLAN    1. MS (multiple sclerosis) ([place])   2. Hemifacial spasm of right side of face   3. High risk medication use   4. Chronic migraine   5. Malignant neoplasm of upper-outer quadrant of left breast in female, estrogen receptor positive ([place])   6. Dysesthesia   7. Depression with anxiety   8. Ulnar neuritis, unspecified laterality   9. Cervical radiculopathy      1.   Botox (176 units) was injected into the muscles of the face and neck as described above for hemifacial spasm and chronic migraine.  She tolerated the injections well and there were no complications. 2.   ContinueTecfidera 240 mg twice a day for MS.     lymphocyte count has been good. 3.    Continue Ritalin 20 mg 2 daily hypersomnia and MS fatigue.  Continue Valium for spasticity and anxiety 4.   Continue Effexor and Abilify 5 mg daily.  She is now seeing psychiatry and med's may change to try to increase appetite 5.    NCV/EMG for suspected ulnar neuropathies 6.    `She will return in 3 months or sooner if there are new or worsening neurologic symptoms.  45-minute office visit with the majority of the time spent face-to-face for history and physical, discussion/counseling and decision-making.  Additional time with record review and documentation.   Jamason Peckham A. Felecia Shelling, MD, PhD 6/80/3212, 2:48 PM Certified in Neurology, Clinical Neurophysiology, Sleep Medicine, Pain Medicine and Neuroimaging  [facility] [address], [place], [place] [address] [phone]

## 2020-04-24 ENCOUNTER — Other Ambulatory Visit: Payer: Self-pay | Admitting: Neurology

## 2020-05-17 ENCOUNTER — Other Ambulatory Visit: Payer: Self-pay | Admitting: Neurology

## 2020-05-20 ENCOUNTER — Encounter (INDEPENDENT_AMBULATORY_CARE_PROVIDER_SITE_OTHER): Payer: Medicare Other | Admitting: Neurology

## 2020-05-20 ENCOUNTER — Ambulatory Visit (INDEPENDENT_AMBULATORY_CARE_PROVIDER_SITE_OTHER): Payer: Medicare Other | Admitting: Neurology

## 2020-05-20 DIAGNOSIS — Z0289 Encounter for other administrative examinations: Secondary | ICD-10-CM

## 2020-05-20 DIAGNOSIS — M5412 Radiculopathy, cervical region: Secondary | ICD-10-CM

## 2020-05-20 DIAGNOSIS — G35 Multiple sclerosis: Secondary | ICD-10-CM

## 2020-05-20 DIAGNOSIS — R208 Other disturbances of skin sensation: Secondary | ICD-10-CM | POA: Diagnosis not present

## 2020-05-20 DIAGNOSIS — G5620 Lesion of ulnar nerve, unspecified upper limb: Secondary | ICD-10-CM

## 2020-05-20 MED ORDER — GABAPENTIN 300 MG PO CAPS: 300.0000 mg | ORAL_CAPSULE | Freq: Four times a day (QID) | ORAL | 3 refills | Status: DC

## 2020-05-20 NOTE — Progress Notes (Signed)
Full Name: Stacy Logan Gender: Female MRN #: [id number] Date of Birth: November 19, 1965    Visit Date: 05/20/2020 08:07 Age: 54 Years Examining Physician: Arlice Colt, MD  Referring Physician: Arlice Colt, MD Height: 5 feet 9 inch    History: Stacy Logan is a 54 year old woman with multiple sclerosis and a history of cervical fusion reporting left greater than right hand dysesthesias.  On examination, she had minimal weakness in the intrinsic hand muscles (4+5 spine) on the left andh numbness in the fourth and fifth fingers.  Nerve conduction studies: The right median and ulnar motor responses had normal distal latencies, amplitudes and conduction velocities.  The right ulnar F-wave latency was normal.  The right median and ulnar sensory responses had minimally prolonged peak latencies with normal amplitudes.  The sympathetic sensory response was absent in the right palm.  Electromyography: Needle EMG of selected muscles of the right arm was performed.  Motor unit morphology and recruitment was normal in all of the muscles tested except the triceps where there was mild chronic denervation.  There was no abnormal spontaneous activity.  Impression: This NCV/EMG study shows the following: 1.   Minimal median and ulnar neuropathies across the wrist. 2.   Minimal left chronic C6 or C7 radiculopathy without active features.  Stacy Logan A. Felecia Shelling, MD, PhD, FAAN Certified in Neurology, Clinical Neurophysiology, Sleep Medicine, Pain Medicine and Neuroimaging Director, [facility] [address], [place], [place] [address] [phone]   Verbal informed consent was obtained from the patient, patient was informed of potential risk of procedure, including bruising, bleeding, hematoma formation, infection, muscle weakness, muscle pain, numbness, among others.        [name]    Nerve / Sites Muscle Latency  Ref. Amplitude Ref. Rel Amp Segments Distance Velocity Ref. Area    ms ms mV mV %  cm m/s m/s mVms  R Median - APB     Wrist APB 4.2 ?4.4 5.2 ?4.0 100 Wrist - APB 7   20.3     Upper arm APB 8.8  4.9  94.2 Upper arm - Wrist 23 50 ?49 19.8  R Ulnar - ADM     Wrist ADM 3.1 ?3.3 8.5 ?6.0 100 Wrist - ADM 7   28.5     B.Elbow ADM 7.0  7.6  90 B.Elbow - Wrist 21 55 ?49 26.9     A.Elbow ADM 8.7  7.5  98.7 A.Elbow - B.Elbow 10 57 ?49 26.1         SSR    Nerve / Sites Latency   s  R Sympathetic - Palm     Palm NR       SNC    Nerve / Sites Rec. Site Peak Lat Ref.  Amp Ref. Segments Distance Peak Diff Ref.    ms ms V V  cm ms ms  R Median, Ulnar - Transcarpal comparison     Median Palm Wrist 2.4 ?2.2 115 ?35 Median Palm - Wrist 8       Ulnar Palm Wrist 2.4 ?2.2 21 ?12 Ulnar Palm - Wrist 8          Median Palm - Ulnar Palm  0.0 ?0.4  R Median - Orthodromic (Dig II, Mid palm)     Dig II Wrist 3.4 ?3.4 12 ?10 Dig II - Wrist 13    R Ulnar - Orthodromic, (Dig V, Mid palm)  Dig V Wrist 3.1 ?3.1 13 ?5 Dig V - Wrist 49             F  Wave    Nerve F Lat Ref.   ms ms  R Ulnar - ADM 29.3 ?32.0       EMG Summary Table    Spontaneous MUAP Recruitment  Muscle IA Fib PSW Fasc Other Amp Dur. Poly Pattern  R. Deltoid Normal None None None _______ Normal Normal Normal Normal  R. Biceps brachii Normal None None None _______ Normal Normal Normal Normal  R. Triceps brachii Normal None None None _______ Increased Increased 1+ Reduced  R. Flexor carpi ulnaris Normal None None None _______ Normal Normal Normal Normal  R. Extensor digitorum communis Normal None None None _______ Normal Normal Normal Normal  R. Abductor pollicis brevis Normal None None None _______ Normal Normal Normal Normal  R. First dorsal interosseous Normal None None None _______ Normal Normal Normal Normal  R. Supraspinatus Normal None None None _______ Normal Normal Normal Normal        [facility]  PATIENT: Stacy Logan DOB: 24-Oct-1965  REFERRING DOCTOR OR PCP:  Annye Asa SOURCE: patient, EMR records from [facility].  MRI images on PACS  _________________________________   HISTORICAL  CHIEF COMPLAINT:  Arm numbness/pain  HISTORY OF PRESENT ILLNESS:  Stacy Logan is a 54 y.o. woman with multiple sclerosis who is reporting more pain and numbness in the hands, right much worse than left.  She also notes some weakness in the right hand.  Of note, she has had cervical fusion from C3-C7.  She does not recall whether the left with the right arm hurt more before her surgery.  MRI of the cervical spine does show some T2 hyperintense foci within the spinal cord.  Current symptoms worsened a few months ago.  She does not think that they have progressed anymore over the last few weeks.  NCV/EMG study today showed minimal median and ulnar neuropathies across the wrist and minimal left chronic C6 or C7 radiculopathy without active features.    MS/spine history:   In 2001, she was noted to have left hearing loss and had an MRI of the brain.   She did not have numbness or weakness a that time.  She was told she likely had MS.   She saw a neurologist.  She never had an LP.   She reports she was told she had MS but was never started on any medication.   Starting about 5-6 years ago, she was noting more difficulty with gait and also noted some memory issues.   She was also having bladder issues with urgency and bladder incontinence x 5 years.      However, she had no health insurance at that time and did not follow up with neurology.   She saw Dr. Tomi Likens last year.   He ordered an MRI of the brian and cervical spine.   She had one enhancing lesion on the spine and one additional lesion.  An LP was ordered but she opted not to do the test.   She was started on tecfidera.   She has had some itching and flushing, especially if she does not take after food.     She also was found  to have severe spinal stenosis and myelopathy.   She was sent to Dr. Arnoldo Morale of Neurosurgery.   She underwent  ACDF from C3-C7 06/2014.    She reports she  is having more trouble with her arms since the surgery.   The worse pain is in the 2nd and 3rd fingers but she has altered sensation with some pain in the other fingers as well.   She also has right > left lower neck pain.  Bladder is about the same (maybe slightly better) as it was before surgery.      MRI images:  MRI of the brain dated 01/12/2015 and the MRIs of the cervical spine dated 02/01/2015 and 05/22/2014 were reviewed.   The MRI of the brain shows white matter foci predominantly in the deep white matter but also in the periventricular and subcortical white matter of both hemispheres. The brainstem appears normal. There is no significant atrophy. The MRI of the cervical spine from 2015 showed severe spinal stenosis at C5-C6 and milder spinal stenosis at C6-C7 and C4-C5. There is an enhancing focus within the spinal cord adjacent to C5-C6 and she has another hyperintense focus at C3-C4. On the second MRI, done without contrast, she is status post C3-C7 ACDF.     05/13/2018 MRI of the brain and cervical spine: The MRI of the brain shows multiple T2/FLAIR hyperintense foci in the hemispheres and in the pons.  The overall extent is similar to what was noted on the 01/12/2015 MRI.  Although some foci are consistent with multiple sclerosis, others would be more likely due to chronic microvascular ischemic changes.  None of the lesions appear to be acute.  MRI of the cervical spine shows T2 hyperintense foci within the spinal cord centrally adjacent to C3 and adjacent to C4-C5.  There are bilateral hyperintense foci, right greater than left and possibly within the anterior horns, adjacent to C6.  The C3 and C4-C5 foci are nonspecific and could be due to demyelination but could also be the sequela of compressive myelopathy.  The changes at C5-C6 are more  consistent with the sequela of compressive myelopathy.  Breast Ca:   She was diagnosed with breast cancer and had surgery, RadRx and chemo.  She remains in remission.   REVIEW OF SYSTEMS: Constitutional: No fevers, chills, sweats, or change in appetite Eyes: as above Ear, nose and throat: No hearing loss, ear pain, nasal congestion, sore throat Cardiovascular: No chest pain, palpitations Respiratory: No shortness of breath at rest or with exertion.   No wheezes GastrointestinaI: No nausea, vomiting, diarrhea, abdominal pain, fecal incontinence Genitourinary: see above Musculoskeletal: Some neck pain, back pain Integumentary: No rash, pruritus, skin lesions Neurological: as above Psychiatric: Notes depression and anxiety Endocrine: No palpitations, diaphoresis, change in appetite, change in weigh or increased thirst Hematologic/Lymphatic: No anemia, purpura, petechiae. Allergic/Immunologic: No itchy/runny eyes, nasal congestion, recent allergic reactions, rashes  ALLERGIES: Allergies  Allergen Reactions  . Codeine     Makes her "busy", itchy  . Lamictal [Lamotrigine] Other (See Comments)    Mood changes   . Meperidine Nausea And Vomiting  . Meperidine Hcl Nausea And Vomiting    SEVERE N/V  . Morphine Other (See Comments)    "SKIN CRAWLS"    HOME MEDICATIONS:  Current Outpatient Medications:  .  amitriptyline (ELAVIL) 25 MG tablet, TAKE 1-2 TABLETS (25-50 MG TOTAL) BY MOUTH AT BEDTIME., Disp: 180 tablet, Rfl: 3 .  ARIPiprazole (ABILIFY) 5 MG tablet, TAKE 1 TABLET BY MOUTH EVERY DAY, Disp: 90 tablet, Rfl: 4 .  baclofen (LIORESAL) 10 MG tablet, TAKE 1 TABLET BY MOUTH TWICE A DAY, Disp: 180 tablet, Rfl: 3 .  BOTOX 100 units SOLR injection,  INJECT 100 UNITS  INTRAMUSCULARLY EVERY 3  MONTHS (GIVEN AT  PRESCRIBERS OFFICE, DISCARD UNUSED), Disp: 100 Units, Rfl: 1 .  diazepam (VALIUM) 5 MG tablet, TAKE 1 TAB BY MOUTH EVERY 8 HOURS AS NEEDED FOR SPASMS, Disp: 90 tablet, Rfl: 2 .   gabapentin (NEURONTIN) 300 MG capsule, Take 1 capsule (300 mg total) by mouth 4 (four) times daily., Disp: 360 capsule, Rfl: 3 .  letrozole (FEMARA) 2.5 MG tablet, TAKE 1 TABLET BY MOUTH EVERY DAY, Disp: 90 tablet, Rfl: 3 .  methylphenidate (RITALIN) 20 MG tablet, Take 1 tablet (20 mg total) by mouth 2 (two) times daily with breakfast and lunch., Disp: 60 tablet, Rfl: 0 .  OnabotulinumtoxinA (BOTOX IM), Inject into the muscle. Facial to treat MS, Disp: , Rfl:  .  pantoprazole (PROTONIX) 40 MG tablet, Take 1 tablet (40 mg total) by mouth daily., Disp: 90 tablet, Rfl: 1 .  promethazine (PHENERGAN) 25 MG tablet, TAKE ONE TABLET BY MOUTH DAILY AS NEEDED FOR NAUSEA, Disp: 30 tablet, Rfl: 0 .  rizatriptan (MAXALT) 5 MG tablet, 1 TAB AS NEEDED FOR MIGRAINE. MAY REPEAT IN 2 HRS IF NEEDED. MAX 2 TABS IN24HR OR 2-3 DOSES IN A WK., Disp: 10 tablet, Rfl: 5 .  TECFIDERA 240 MG CPDR, TAKE 1 CAPSULE (240MG ) BY MOUTH TWICE DAILY, Disp: 60 capsule, Rfl: 11 .  valACYclovir (VALTREX) 1000 MG tablet, TAKE 2 TABLETS EVERY 12 HOURS X2 DOSES AS NEEDED FOR COLD SORES, Disp: 30 tablet, Rfl: 0 .  venlafaxine XR (EFFEXOR-XR) 75 MG 24 hr capsule, Take 1 capsule (75 mg total) by mouth daily with breakfast., Disp: 30 capsule, Rfl: 5  Current Facility-Administered Medications:  .  0.9 %  sodium chloride infusion, 500 mL, Intravenous, Continuous, Pyrtle, Lajuan Lines, MD  PAST MEDICAL HISTORY: Past Medical History:  Diagnosis Date  . Anal fissure   . Anxiety   . Arthritis   . Borderline diabetes   . Cancer [facility])    left breast cancer  . Depression   . Frequency of urination   . H/O cold sores   . History of pneumothorax    1988-- SPONTANEOUS--  RESOLVED W/ CHEST TUBE  . History of radiation therapy 07/29/16- 08/27/16   Left Breast 40.05 Gy in 15 fractions, Left Breast Boost 10 Gy in 5 fractions.   . IC (interstitial cystitis)   . Internal hemorrhoids   . Lesion of bladder   . Migraines   . MS (multiple sclerosis) ([place])     MRI showed plague on her brain  . Nocturia   . Personal history of radiation therapy   . Preeclampsia 1994  . RSD (reflex sympathetic dystrophy)   . Seasonal allergies   . Tubular adenoma of colon   . Urgency of urination     PAST SURGICAL HISTORY: Past Surgical History:  Procedure Laterality Date  . ANTERIOR CERVICAL DECOMPRESSION/DISCECTOMY FUSION 4 LEVELS N/A 07/03/2014   Procedure: ANTERIOR CERVICAL DECOMPRESSION/DISCECTOMY FUSION 4 LEVELS;  Surgeon: Newman Pies, MD;  Location: [facility];  Service: Neurosurgery;  Laterality: N/A;  C3-4 C4-5 C5-6 C6-7 Anterior cervical decompression/diskectomy/fusion/interbody prosthesis/plate  . APPENDECTOMY    . CESAREAN SECTION  1994  . CYSTOSCOPY WITH HYDRODISTENSION AND BIOPSY Bilateral 02/15/2014   Procedure: CYSTOSCOPY  BILATERAL RETROGRADE PYLOGRAM, HYDRODISTENSION, INSTILATION OF MARCAINE AND PYRIDIUM;  Surgeon: Ardis Hughs, MD;  Location[facility];  Service: Urology;  Laterality: Bilateral;  . D & C HYSTEROSCOPY WITH POLYPECTOMY  12-02-2003  . DILATION AND CURETTAGE  OF UTERUS    . DX LAPAROSCOPY/  FULGERATION ENDOMETRIOSIS/  APPENDECTOMY  1985  . RADIOACTIVE SEED GUIDED PARTIAL MASTECTOMY WITH AXILLARY SENTINEL LYMPH NODE BIOPSY Left 06/07/2016   Procedure: BREAST LUMPECTOMY WITH RADIOACTIVE SEED AND SENTINEL LYMPH NODE BIOPSY, INJECT BLUE DYE LEFT BREAST;  Surgeon: Fanny Skates, MD;  Location: [place];  Service: General;  Laterality: Left;  . TONSILLECTOMY AND ADENOIDECTOMY  1972    FAMILY HISTORY: Family History  Problem Relation Age of Onset  . Hypertension Mother   . Diabetes Father   . Prostate cancer Father        dx late 97s  . Lung cancer Father 60       smoker  . Cancer Father        cheek of mouth, dx. between 91 and 59  . Diabetes Paternal Grandmother   . Diabetes Paternal Grandfather   . Aneurysm Maternal Grandmother 76       d. brain aneurysm  . Prostate cancer  Maternal Grandfather   . Diabetes Paternal Uncle        x 4  . Diabetes Paternal Aunt   . Colon cancer Maternal Uncle   . Cancer Maternal Uncle        mouth cancer; +tobacco  . Breast cancer Sister 27       s/p mastectomy  . Breast cancer Maternal Aunt        dx 4s  . Lymphoma Maternal Aunt 83  . Uterine cancer Maternal Aunt        d. late 27s  . Throat cancer Cousin        maternal 1st cousin; lim info  . Cancer Paternal Uncle        NOS cancer  . Breast cancer Cousin        paternal 1st cousin dx 49s  . Esophageal cancer Neg Hx   . Rectal cancer Neg Hx   . Stomach cancer Neg Hx      PHYSICAL EXAM  There were no vitals filed for this visit.  There is no height or weight on file to calculate BMI.   General: The patient is a thin woman in no acute distress   Neurologic Exam  Mental status: The patient is alert and oriented x 3 at the time of the examination. The patient has apparent normal recent and remote memory, with an apparently normal attention span and concentration ability.   Speech is normal.  Cranial nerves:  Marland Kitchen No hemifacial spasms noted.  No ptosis.  Facial strength and sensation is normal.  Trapezius and sternocleidomastoid strength is normal. No dysarthria is noted.  The tongue is midline, and the patient has symmetric elevation of the soft palate. No obvious hearing deficits are noted.  Motor:  Muscle bulk is normal.   Muscle tone is normal.  She has 4+/5 strength in the right triceps and the intrinsic hand muscles on the right.  Strength was normal in the legs.  Sensory: She has decreased sensation in the right arm.  There is reduced sensation in the third fourth and fifth fingers on the right..   '  Gait and station: Station is normal.   Gait is fairly normal but the tandem gait is wide.  Romberg is negative  Reflexes: Deep tendon reflexes are increased in both legs with spread at the knees. There is no ankle clonus.Marland Kitchen       DIAGNOSTIC DATA (LABS,  IMAGING, TESTING) - I reviewed patient records, labs,  notes, testing and imaging myself where available.  Lab Results  Component Value Date   WBC 4.6 01/15/2020   HGB 12.4 01/15/2020   HCT 36.4 01/15/2020   MCV 88 01/15/2020   PLT 248 01/15/2020      Component Value Date/Time   NA 139 02/01/2019 1047   K 4.5 02/01/2019 1047   CL 100 02/01/2019 1047   CO2 24 02/01/2019 1047   GLUCOSE 107 (H) 02/01/2019 1047   GLUCOSE 94 06/03/2016 1500   BUN 9 02/01/2019 1047   CREATININE 0.67 02/01/2019 1047   CREATININE 0.55 12/26/2014 1042   CALCIUM 10.1 02/01/2019 1047   PROT 6.9 02/01/2019 1047   ALBUMIN 4.6 02/01/2019 1047   AST 18 02/01/2019 1047   ALT 14 02/01/2019 1047   ALKPHOS 85 02/01/2019 1047   BILITOT <0.2 02/01/2019 1047   GFRNONAA 99 02/01/2019 1047   GFRNONAA >89 12/26/2014 1042   GFRAA 114 02/01/2019 1047   GFRAA >89 12/26/2014 1042     ASSESSMENT AND PLAN    1. MS (multiple sclerosis) (HCC)   2. Dysesthesia   3. Ulnar neuritis, unspecified laterality   4. Cervical radiculopathy      1.   NCV/EMG today.   2.   ContinueTecfidera 240 mg twice a day for MS.     continue other medications for hypersomnia, fatigue, spasticity and anxiety and depression 3.   Due to her increased pain I will increase the gabapentin from 300 mg p.o. 3 times daily to 300-300-600 over the day.  We could increase this further if needed.   4.    She will return in 3 months or sooner if there are new or worsening neurologic symptoms.  45-minute office visit with the majority of the time spent face-to-face for history and physical, discussion/counseling and decision-making.  Additional time with record review and documentation.   Dawayne Ohair A. Felecia Shelling, MD, PhD 6/54/6503, 54:65 AM Certified in Neurology, Clinical Neurophysiology, Sleep Medicine, Pain Medicine and Neuroimaging  [facility] [address], [place], [place] [address] [phone]

## 2020-06-07 DIAGNOSIS — M545 Low back pain: Secondary | ICD-10-CM | POA: Diagnosis not present

## 2020-06-18 ENCOUNTER — Other Ambulatory Visit: Payer: Self-pay | Admitting: Neurology

## 2020-06-18 DIAGNOSIS — G35 Multiple sclerosis: Secondary | ICD-10-CM

## 2020-07-11 ENCOUNTER — Ambulatory Visit (INDEPENDENT_AMBULATORY_CARE_PROVIDER_SITE_OTHER): Payer: Medicare Other | Admitting: Podiatry

## 2020-07-11 ENCOUNTER — Other Ambulatory Visit: Payer: Self-pay

## 2020-07-11 DIAGNOSIS — L600 Ingrowing nail: Secondary | ICD-10-CM | POA: Diagnosis not present

## 2020-07-15 ENCOUNTER — Encounter: Payer: Self-pay | Admitting: Podiatry

## 2020-07-15 NOTE — Progress Notes (Signed)
Subjective:  Patient ID: Stacy Logan, female    DOB: May 13, 1966,  MRN: [id number]  Chief Complaint  Patient presents with  . Ingrown Toenail    Right hallux ingrown nail     54 y.o. female presents with the above complaint.  Patient presents with complaint of right hallux medial ingrown nail border.  Patient states is painful to touch.  Patient would like to have it taken out.  She has not seen anyone else prior to seeing me.  She states that it hurts to ambulate.  She has tried some soaking but has not helped.  She denies any other acute complaints.  Pain scale is 7 out of 10.   Review of Systems: Negative except as noted in the HPI. Denies N/V/F/Ch.  Past Medical History:  Diagnosis Date  . Anal fissure   . Anxiety   . Arthritis   . Borderline diabetes   . Cancer [facility])    left breast cancer  . Depression   . Frequency of urination   . H/O cold sores   . History of pneumothorax    1988-- SPONTANEOUS--  RESOLVED W/ CHEST TUBE  . History of radiation therapy 07/29/16- 08/27/16   Left Breast 40.05 Gy in 15 fractions, Left Breast Boost 10 Gy in 5 fractions.   . IC (interstitial cystitis)   . Internal hemorrhoids   . Lesion of bladder   . Migraines   . MS (multiple sclerosis) ([place])    MRI showed plague on her brain  . Nocturia   . Personal history of radiation therapy   . Preeclampsia 1994  . RSD (reflex sympathetic dystrophy)   . Seasonal allergies   . Tubular adenoma of colon   . Urgency of urination     Current Outpatient Medications:  .  amitriptyline (ELAVIL) 25 MG tablet, TAKE 1-2 TABLETS (25-50 MG TOTAL) BY MOUTH AT BEDTIME., Disp: 180 tablet, Rfl: 3 .  ARIPiprazole (ABILIFY) 5 MG tablet, TAKE 1 TABLET BY MOUTH EVERY DAY, Disp: 90 tablet, Rfl: 4 .  baclofen (LIORESAL) 10 MG tablet, TAKE 1 TABLET BY MOUTH TWICE A DAY, Disp: 180 tablet, Rfl: 3 .  BOTOX 100 units SOLR injection, INJECT 100 UNITS  INTRAMUSCULARLY EVERY 3  MONTHS (GIVEN AT  PRESCRIBERS OFFICE,  DISCARD UNUSED), Disp: 100 Units, Rfl: 1 .  diazepam (VALIUM) 5 MG tablet, TAKE 1 TAB BY MOUTH EVERY 8 HOURS AS NEEDED FOR SPASMS, Disp: 90 tablet, Rfl: 2 .  gabapentin (NEURONTIN) 300 MG capsule, Take 1 capsule (300 mg total) by mouth 4 (four) times daily., Disp: 360 capsule, Rfl: 3 .  letrozole (FEMARA) 2.5 MG tablet, TAKE 1 TABLET BY MOUTH EVERY DAY, Disp: 90 tablet, Rfl: 3 .  methylphenidate (RITALIN) 20 MG tablet, Take 1 tablet (20 mg total) by mouth 2 (two) times daily with breakfast and lunch., Disp: 60 tablet, Rfl: 0 .  OnabotulinumtoxinA (BOTOX IM), Inject into the muscle. Facial to treat MS, Disp: , Rfl:  .  pantoprazole (PROTONIX) 40 MG tablet, Take 1 tablet (40 mg total) by mouth daily., Disp: 90 tablet, Rfl: 1 .  promethazine (PHENERGAN) 25 MG tablet, TAKE ONE TABLET BY MOUTH DAILY AS NEEDED FOR NAUSEA, Disp: 30 tablet, Rfl: 0 .  rizatriptan (MAXALT) 5 MG tablet, 1 TAB AS NEEDED FOR MIGRAINE. MAY REPEAT IN 2 HRS IF NEEDED. MAX 2 TABS IN24HR OR 2-3 DOSES IN A WK., Disp: 10 tablet, Rfl: 5 .  TECFIDERA 240 MG CPDR, TAKE 1 CAPSULE BY MOUTH  TWICE DAILY, Disp: 60 capsule, Rfl: 11 .  valACYclovir (VALTREX) 1000 MG tablet, TAKE 2 TABLETS EVERY 12 HOURS X2 DOSES AS NEEDED FOR COLD SORES, Disp: 30 tablet, Rfl: 0 .  venlafaxine XR (EFFEXOR-XR) 75 MG 24 hr capsule, Take 1 capsule (75 mg total) by mouth daily with breakfast., Disp: 30 capsule, Rfl: 5  Current Facility-Administered Medications:  .  0.9 %  sodium chloride infusion, 500 mL, Intravenous, Continuous, Pyrtle, Lajuan Lines, MD  Social History   Tobacco Use  Smoking Status Former Smoker  . Packs/day: 0.25  . Years: 27.00  . Pack years: 6.75  . Types: Cigarettes  . Quit date: 01/17/2011  . Years since quitting: 9.4  Smokeless Tobacco Never Used    Allergies  Allergen Reactions  . Codeine     Makes her "busy", itchy  . Lamictal [Lamotrigine] Other (See Comments)    Mood changes   . Meperidine Nausea And Vomiting  . Meperidine Hcl  Nausea And Vomiting    SEVERE N/V  . Morphine Other (See Comments)    "SKIN CRAWLS"   Objective:  There were no vitals filed for this visit. There is no height or weight on file to calculate BMI. Constitutional Well developed. Well nourished.  Vascular Dorsalis pedis pulses palpable bilaterally. Posterior tibial pulses palpable bilaterally. Capillary refill normal to all digits.  No cyanosis or clubbing noted. Pedal hair growth normal.  Neurologic Normal speech. Oriented to person, place, and time. Epicritic sensation to light touch grossly present bilaterally.  Dermatologic Painful ingrowing nail at medial nail borders of the hallux nail right. No other open wounds. No skin lesions.  Orthopedic: Normal joint ROM without pain or crepitus bilaterally. No visible deformities. No bony tenderness.   Radiographs: None Assessment:   1. Ingrown toenail of right foot    Plan:  Patient was evaluated and treated and all questions answered.  Ingrown Nail, right -Patient elects to proceed with minor surgery to remove ingrown toenail removal today. Consent reviewed and signed by patient. -Ingrown nail excised. See procedure note. -Educated on post-procedure care including soaking. Written instructions provided and reviewed. -Patient to follow up in 2 weeks for nail check.  Procedure: Excision of Ingrown Toenail Location: Right 1st toe medial nail borders. Anesthesia: Lidocaine 1% plain; 1.5 mL and Marcaine 0.5% plain; 1.5 mL, digital block. Skin Prep: Betadine. Dressing: Silvadene; telfa; dry, sterile, compression dressing. Technique: Following skin prep, the toe was exsanguinated and a tourniquet was secured at the base of the toe. The affected nail border was freed, split with a nail splitter, and excised. Chemical matrixectomy was then performed with phenol and irrigated out with alcohol. The tourniquet was then removed and sterile dressing applied. Disposition: Patient tolerated  procedure well. Patient to return in 2 weeks for follow-up.   No follow-ups on file.

## 2020-07-18 ENCOUNTER — Other Ambulatory Visit: Payer: Self-pay | Admitting: Hematology and Oncology

## 2020-07-21 ENCOUNTER — Ambulatory Visit (INDEPENDENT_AMBULATORY_CARE_PROVIDER_SITE_OTHER): Payer: Medicare Other | Admitting: Neurology

## 2020-07-21 ENCOUNTER — Telehealth: Payer: Self-pay | Admitting: Hematology and Oncology

## 2020-07-21 ENCOUNTER — Other Ambulatory Visit: Payer: Self-pay

## 2020-07-21 ENCOUNTER — Encounter: Payer: Self-pay | Admitting: Neurology

## 2020-07-21 VITALS — BP 118/62 | HR 100 | Ht 69.75 in | Wt 120.5 lb

## 2020-07-21 DIAGNOSIS — G43709 Chronic migraine without aura, not intractable, without status migrainosus: Secondary | ICD-10-CM | POA: Diagnosis not present

## 2020-07-21 DIAGNOSIS — F418 Other specified anxiety disorders: Secondary | ICD-10-CM

## 2020-07-21 DIAGNOSIS — R309 Painful micturition, unspecified: Secondary | ICD-10-CM | POA: Diagnosis not present

## 2020-07-21 DIAGNOSIS — G35 Multiple sclerosis: Secondary | ICD-10-CM

## 2020-07-21 DIAGNOSIS — R269 Unspecified abnormalities of gait and mobility: Secondary | ICD-10-CM

## 2020-07-21 DIAGNOSIS — R208 Other disturbances of skin sensation: Secondary | ICD-10-CM

## 2020-07-21 DIAGNOSIS — G5131 Clonic hemifacial spasm, right: Secondary | ICD-10-CM | POA: Diagnosis not present

## 2020-07-21 DIAGNOSIS — Z79899 Other long term (current) drug therapy: Secondary | ICD-10-CM | POA: Diagnosis not present

## 2020-07-21 DIAGNOSIS — Z681 Body mass index (BMI) 19 or less, adult: Secondary | ICD-10-CM | POA: Diagnosis not present

## 2020-07-21 DIAGNOSIS — R300 Dysuria: Secondary | ICD-10-CM | POA: Diagnosis not present

## 2020-07-21 NOTE — Telephone Encounter (Signed)
Called pt per 10/29 sch msg- no answer and  Vmail full. Scheduled appt and mailed letter.

## 2020-07-21 NOTE — Progress Notes (Signed)
[facility]  PATIENT: Stacy Logan DOB: [birth year]  REFERRING DOCTOR OR PCP:  Annye Asa SOURCE: patient, EMR records from [facility].  MRI images on PACS  _________________________________   HISTORICAL  CHIEF COMPLAINT:  Chief Complaint  Patient presents with  . Botulinum Toxin Injection    Room 12. She is here for Botox injections for her migraines.    HISTORY OF PRESENT ILLNESS:  Stacy Logan is a 55 y.o. 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   She also has migraine headaches    Update 07/21/20 She is reporting a HA right now but has generally done better with Botox - often the last 1-2 weeks of each cycle are worse.     Facial twitching has done well since the last Botox series with only a few tics.     MS is stable on Tecfidera and she tolerates to well.   Lymphocyte counts were 0.8 01/15/2020.  She is noting more numbness and weakness in both hands.  Bladder function is fine,  Her mood has done well on Abilify, Effexor and diazepam.    She sleeps well most nights.   Ritalin has helped her focus/attention and fatigue  LBP is acting up more.  Pain goes from back to the front of the right leg and she alsonotes hip pain.  Since the last visit, she had a NCV/EMG 05/20/2020 showing Minimal median and ulnar neuropathies across the wrist. And and Minimal left chronic C6 or C7 radiculopathy without active features.   MS/spine history:   In 2001, she was noted to have left hearing loss and had an MRI of the brain.   She did not have numbness or weakness a that time.  She was told she likely had MS.   She saw a neurologist.  She never had an LP.   She reports she was told she had MS but was never started on any medication.   Starting about 5-6 years ago, she was noting more difficulty with gait and also noted some memory issues.   She was also having bladder issues with urgency and bladder  incontinence x 5 years.      However, she had no health insurance at that time and did not follow up with neurology.   She saw Dr. Tomi Likens last year.   He ordered an MRI of the brian and cervical spine.   She had one enhancing lesion on the spine and one additional lesion.  An LP was ordered but she opted not to do the test.   She was started on tecfidera.   She has had some itching and flushing, especially if she does not take after food.     She also was found to have severe spinal stenosis and myelopathy.   She was sent to Dr. Arnoldo Morale of Neurosurgery.   She underwent  ACDF from C3-C7 06/2014.    She reports she is having more trouble with her arms since the surgery.   The worse pain is in the 2nd and 3rd fingers but she has altered sensation with some pain in the other fingers as well.   She also has right > left lower neck pain.  Bladder is about the same (maybe slightly better) as it was before surgery.       Breast Ca:   She was diagnosed with breast cancer and had surgery, RadRx and chemo.  She remains in remission.  MRI images:  MRI of the brain dated 01/12/2015 and the MRIs of the cervical spine dated 02/01/2015 and 05/22/2014 were reviewed.   The MRI of the brain shows white matter foci predominantly in the deep white matter but also in the periventricular and subcortical white matter of both hemispheres. The brainstem appears normal. There is no significant atrophy. The MRI of the cervical spine from 2015 showed severe spinal stenosis at C5-C6 and milder spinal stenosis at C6-C7 and C4-C5. There is an enhancing focus within the spinal cord adjacent to C5-C6 and she has another hyperintense focus at C3-C4. On the second MRI, done without contrast, she is status post C3-C7 ACDF.     05/13/2018 MRI of the brain and cervical spine: The MRI of the brain shows multiple T2/FLAIR hyperintense foci in the hemispheres and in the pons.  The overall extent is similar to what was noted on the 01/12/2015 MRI.   Although some foci are consistent with multiple sclerosis, others would be more likely due to chronic microvascular ischemic changes.  None of the lesions appear to be acute.  MRI of the cervical spine shows T2 hyperintense foci within the spinal cord centrally adjacent to C3 and adjacent to C4-C5.  There are bilateral hyperintense foci, right greater than left and possibly within the anterior horns, adjacent to C6.  The C3 and C4-C5 foci are nonspecific and could be due to demyelination but could also be the sequela of compressive myelopathy.  The changes at C5-C6 are more consistent with the sequela of compressive myelopathy.   REVIEW OF SYSTEMS: Constitutional: No fevers, chills, sweats, or change in appetite Eyes: as above Ear, nose and throat: No hearing loss, ear pain, nasal congestion, sore throat Cardiovascular: No chest pain, palpitations Respiratory: No shortness of breath at rest or with exertion.   No wheezes GastrointestinaI: No nausea, vomiting, diarrhea, abdominal pain, fecal incontinence Genitourinary: see above Musculoskeletal: Some neck pain, back pain Integumentary: No rash, pruritus, skin lesions Neurological: as above Psychiatric: Notes depression and anxiety Endocrine: No palpitations, diaphoresis, change in appetite, change in weigh or increased thirst Hematologic/Lymphatic: No anemia, purpura, petechiae. Allergic/Immunologic: No itchy/runny eyes, nasal congestion, recent allergic reactions, rashes  ALLERGIES: Allergies  Allergen Reactions  . Codeine     Makes her "busy", itchy  . Lamictal [Lamotrigine] Other (See Comments)    Mood changes   . Meperidine Nausea And Vomiting  . Meperidine Hcl Nausea And Vomiting    SEVERE N/V  . Morphine Other (See Comments)    "SKIN CRAWLS"    HOME MEDICATIONS:  Current Outpatient Medications:  .  amitriptyline (ELAVIL) 25 MG tablet, TAKE 1-2 TABLETS (25-50 MG TOTAL) BY MOUTH AT BEDTIME., Disp: 180 tablet, Rfl: 3 .   ARIPiprazole (ABILIFY) 5 MG tablet, TAKE 1 TABLET BY MOUTH EVERY DAY, Disp: 90 tablet, Rfl: 4 .  baclofen (LIORESAL) 10 MG tablet, TAKE 1 TABLET BY MOUTH TWICE A DAY, Disp: 180 tablet, Rfl: 3 .  BOTOX 100 units SOLR injection, INJECT 100 UNITS  INTRAMUSCULARLY EVERY 3  MONTHS (GIVEN AT  PRESCRIBERS OFFICE, DISCARD UNUSED), Disp: 100 Units, Rfl: 1 .  diazepam (VALIUM) 5 MG tablet, TAKE 1 TAB BY MOUTH EVERY 8 HOURS AS NEEDED FOR SPASMS, Disp: 90 tablet, Rfl: 2 .  gabapentin (NEURONTIN) 300 MG capsule, Take 1 capsule (300 mg total) by mouth 4 (four) times daily., Disp: 360 capsule, Rfl: 3 .  letrozole (FEMARA) 2.5 MG tablet, TAKE 1 TABLET BY MOUTH EVERY DAY, Disp: 90 tablet, Rfl: 3 .  methylphenidate (RITALIN) 20 MG tablet, Take 1 tablet (20 mg total) by mouth 2 (two) times daily with breakfast and lunch., Disp: 60 tablet, Rfl: 0 .  OnabotulinumtoxinA (BOTOX IM), Inject into the muscle. Facial to treat MS, Disp: , Rfl:  .  pantoprazole (PROTONIX) 40 MG tablet, Take 1 tablet (40 mg total) by mouth daily., Disp: 90 tablet, Rfl: 1 .  promethazine (PHENERGAN) 25 MG tablet, TAKE ONE TABLET BY MOUTH DAILY AS NEEDED FOR NAUSEA, Disp: 30 tablet, Rfl: 0 .  rizatriptan (MAXALT) 5 MG tablet, 1 TAB AS NEEDED FOR MIGRAINE. MAY REPEAT IN 2 HRS IF NEEDED. MAX 2 TABS IN24HR OR 2-3 DOSES IN A WK., Disp: 10 tablet, Rfl: 5 .  TECFIDERA 240 MG CPDR, TAKE 1 CAPSULE BY MOUTH  TWICE DAILY, Disp: 60 capsule, Rfl: 11 .  valACYclovir (VALTREX) 1000 MG tablet, TAKE 2 TABLETS EVERY 12 HOURS X2 DOSES AS NEEDED FOR COLD SORES, Disp: 30 tablet, Rfl: 0 .  venlafaxine XR (EFFEXOR-XR) 75 MG 24 hr capsule, Take 1 capsule (75 mg total) by mouth daily with breakfast., Disp: 30 capsule, Rfl: 5  PAST MEDICAL HISTORY: Past Medical History:  Diagnosis Date  . Anal fissure   . Anxiety   . Arthritis   . Borderline diabetes   . Cancer [facility])    left breast cancer  . Depression   . Frequency of urination   . H/O cold sores   . History of  pneumothorax    1988-- SPONTANEOUS--  RESOLVED W/ CHEST TUBE  . History of radiation therapy 07/29/16- 08/27/16   Left Breast 40.05 Gy in 15 fractions, Left Breast Boost 10 Gy in 5 fractions.   . IC (interstitial cystitis)   . Internal hemorrhoids   . Lesion of bladder   . Migraines   . MS (multiple sclerosis) ([place])    MRI showed plague on her brain  . Nocturia   . Personal history of radiation therapy   . Preeclampsia 1994  . RSD (reflex sympathetic dystrophy)   . Seasonal allergies   . Tubular adenoma of colon   . Urgency of urination     PAST SURGICAL HISTORY: Past Surgical History:  Procedure Laterality Date  . ANTERIOR CERVICAL DECOMPRESSION/DISCECTOMY FUSION 4 LEVELS N/A 07/03/2014   Procedure: ANTERIOR CERVICAL DECOMPRESSION/DISCECTOMY FUSION 4 LEVELS;  Surgeon: Newman Pies, MD;  Location: [facility];  Service: Neurosurgery;  Laterality: N/A;  C3-4 C4-5 C5-6 C6-7 Anterior cervical decompression/diskectomy/fusion/interbody prosthesis/plate  . APPENDECTOMY    . CESAREAN SECTION  1994  . CYSTOSCOPY WITH HYDRODISTENSION AND BIOPSY Bilateral 02/15/2014   Procedure: CYSTOSCOPY  BILATERAL RETROGRADE PYLOGRAM, HYDRODISTENSION, INSTILATION OF MARCAINE AND PYRIDIUM;  Surgeon: Ardis Hughs, MD;  Location: [facility];  Service: Urology;  Laterality: Bilateral;  . D & C HYSTEROSCOPY WITH POLYPECTOMY  12-02-2003  . DILATION AND CURETTAGE OF UTERUS    . DX LAPAROSCOPY/  FULGERATION ENDOMETRIOSIS/  APPENDECTOMY  1985  . RADIOACTIVE SEED GUIDED PARTIAL MASTECTOMY WITH AXILLARY SENTINEL LYMPH NODE BIOPSY Left 06/07/2016   Procedure: BREAST LUMPECTOMY WITH RADIOACTIVE SEED AND SENTINEL LYMPH NODE BIOPSY, INJECT BLUE DYE LEFT BREAST;  Surgeon: Fanny Skates, MD;  Location: [place];  Service: General;  Laterality: Left;  . TONSILLECTOMY AND ADENOIDECTOMY  1972    FAMILY HISTORY: Family History  Problem Relation Age of Onset  . Hypertension Mother     . Diabetes Father   . Prostate cancer Father        dx late 77s  .  Lung cancer Father 54       smoker  . Cancer Father        cheek of mouth, dx. between 35 and 56  . Diabetes Paternal Grandmother   . Diabetes Paternal Grandfather   . Aneurysm Maternal Grandmother 76       d. brain aneurysm  . Prostate cancer Maternal Grandfather   . Diabetes Paternal Uncle        x 4  . Diabetes Paternal Aunt   . Colon cancer Maternal Uncle   . Cancer Maternal Uncle        mouth cancer; +tobacco  . Breast cancer Sister 22       s/p mastectomy  . Breast cancer Maternal Aunt        dx 45s  . Lymphoma Maternal Aunt 83  . Uterine cancer Maternal Aunt        d. late 76s  . Throat cancer Cousin        maternal 1st cousin; lim info  . Cancer Paternal Uncle        NOS cancer  . Breast cancer Cousin        paternal 1st cousin dx 48s  . Esophageal cancer Neg Hx   . Rectal cancer Neg Hx   . Stomach cancer Neg Hx     SOCIAL HISTORY:  Social History   Socioeconomic History  . Marital status: Single    Spouse name: Not on file  . Number of children: 1  . Years of education: Not on file  . Highest education level: Not on file  Occupational History  . Occupation: disabled  Tobacco Use  . Smoking status: Former Smoker    Packs/day: 0.25    Years: 27.00    Pack years: 6.75    Types: Cigarettes    Quit date: 01/17/2011    Years since quitting: 9.5  . Smokeless tobacco: Never Used  Vaping Use  . Vaping Use: Never used  Substance and Sexual Activity  . Alcohol use: No  . Drug use: No  . Sexual activity: Never  Other Topics Concern  . Not on file  Social History Narrative  . Not on file   Social Determinants of Health   Financial Resource Strain:   . Difficulty of Paying Living Expenses: Not on file  Food Insecurity:   . Worried About Charity fundraiser in the Last Year: Not on file  . Ran Out of Food in the Last Year: Not on file  Transportation Needs:   . Lack of  Transportation (Medical): Not on file  . Lack of Transportation (Non-Medical): Not on file  Physical Activity:   . Days of Exercise per Week: Not on file  . Minutes of Exercise per Session: Not on file  Stress:   . Feeling of Stress : Not on file  Social Connections:   . Frequency of Communication with Friends and Family: Not on file  . Frequency of Social Gatherings with Friends and Family: Not on file  . Attends Religious Services: Not on file  . Active Member of Clubs or Organizations: Not on file  . Attends Archivist Meetings: Not on file  . Marital Status: Not on file  Intimate Partner Violence:   . Fear of Current or Ex-Partner: Not on file  . Emotionally Abused: Not on file  . Physically Abused: Not on file  . Sexually Abused: Not on file     PHYSICAL EXAM  Vitals:  07/21/20 1025  BP: 118/62  Pulse: 100  Weight: 120 lb 8 oz (54.7 kg)  Height: 5' 9.75" (1.772 m)    Body mass index is 17.41 kg/m.   General: The patient is a thin woman in no acute distress   Neurologic Exam  Mental status: The patient is alert and oriented x 3 at the time of the examination. The patient has apparent normal recent and remote memory, with an apparently normal attention span and concentration ability.   Speech is normal.  Cranial nerves:  Marland Kitchen   Facial strength and sensation is normal.  Trapezius and sternocleidomastoid strength is normal. No dysarthria is noted.  The tongue is midline, and the patient has symmetric elevation of the soft palate. No obvious hearing deficits are noted.  Motor:  Muscle bulk is normal.   Muscle tone is normal.  She has 4+/5 strength in the right triceps and the intrinsic hand muscles on the right.  Strength was normal in the legs.  Sensory: She has decreased sensation in the right arm.   '  Gait and station: Station is normal.   Gait is fairly normal but the tandem gait is wide.  Romberg is negative  Reflexes: Deep tendon reflexes are increased  in both legs with spread at the knees. There is no ankle clonus.Marland Kitchen       DIAGNOSTIC DATA (LABS, IMAGING, TESTING) - I reviewed patient records, labs, notes, testing and imaging myself where available.  Lab Results  Component Value Date   WBC 4.6 01/15/2020   HGB 12.4 01/15/2020   HCT 36.4 01/15/2020   MCV 88 01/15/2020   PLT 248 01/15/2020      Component Value Date/Time   NA 139 02/01/2019 1047   K 4.5 02/01/2019 1047   CL 100 02/01/2019 1047   CO2 24 02/01/2019 1047   GLUCOSE 107 (H) 02/01/2019 1047   GLUCOSE 94 06/03/2016 1500   BUN 9 02/01/2019 1047   CREATININE 0.67 02/01/2019 1047   CREATININE 0.55 12/26/2014 1042   CALCIUM 10.1 02/01/2019 1047   PROT 6.9 02/01/2019 1047   ALBUMIN 4.6 02/01/2019 1047   AST 18 02/01/2019 1047   ALT 14 02/01/2019 1047   ALKPHOS 85 02/01/2019 1047   BILITOT <0.2 02/01/2019 1047   GFRNONAA 99 02/01/2019 1047   GFRNONAA >89 12/26/2014 1042   GFRAA 114 02/01/2019 1047   GFRAA >89 12/26/2014 1042     __________________________________________________  BOTOX INJECTION  The risks and benefits of Botox injection brain to Mrs. Mossbarger. We discussed the possibility of ptosis as the most common side effect with injection for blepharospasm and hemifacial spasm.   The following injections were performed using sterile technique.  Right frontalis (3 units 2) Left frontalis (3 units 2) Procerus and corrugators (3 units 3) Lateral canthus (2.5 units x 2)                  Right zygomaticus (10 units) Right Buccinator (5 units)  Left Buccinator  (5 units )  Temporalis/Occipitalis  (3 U x 10) 76 units injected into the muscles of the face/scalp  Splenius capitis (12.5 units 2) C3 paraspinal (12.5 units 2) C7 paraspinal (10 units 2)    Trapezius (15 U x 2) 100 U injected into the muscles of the neck   Total 176 units were injected and 24 wasted    ASSESSMENT AND PLAN    1. MS (multiple sclerosis) ([place])   2. High risk medication  use   3.  Hemifacial spasm of right side of face   4. Chronic migraine w/o aura, not intractable, w/o stat migr   5. Gait disturbance   6. Depression with anxiety   7. Dysesthesia      1.   Botox (176 units) was injected into the muscles of the face and neck as described above for hemifacial spasm and chronic migraine.  She tolerated injections well there are no complications.   2.   ContinueTecfidera 240 mg twice a day for MS.     we will recheck blood work  3.    Continue Ritalin 20 mg 2 daily hypersomnia and MS fatigue.  Continue Valium for spasticity and anxiety 4.   Continue Effexor and Abilify 5 mg daily.  She is now seeing psychiatry and med's may change to try to increase appetite 5.   `She will return in 3 months or sooner if there are new or worsening neurologic symptoms.  Jamarian Jacinto A. Felecia Shelling, MD, PhD 67/01/4491, 0:10 PM Certified in Neurology, Clinical Neurophysiology, Sleep Medicine, Pain Medicine and Neuroimaging  [facility] [address] [place], [place] [address] [phone]

## 2020-07-22 LAB — CBC WITH DIFFERENTIAL/PLATELET
Basophils Absolute: 0 10*3/uL (ref 0.0–0.2)
Basos: 0 %
EOS (ABSOLUTE): 0 10*3/uL (ref 0.0–0.4)
Eos: 1 %
Hematocrit: 38.8 % (ref 34.0–46.6)
Hemoglobin: 12.9 g/dL (ref 11.1–15.9)
Immature Grans (Abs): 0 10*3/uL (ref 0.0–0.1)
Immature Granulocytes: 0 %
Lymphocytes Absolute: 0.8 10*3/uL (ref 0.7–3.1)
Lymphs: 14 %
MCH: 30.1 pg (ref 26.6–33.0)
MCHC: 33.2 g/dL (ref 31.5–35.7)
MCV: 91 fL (ref 79–97)
Monocytes Absolute: 0.4 10*3/uL (ref 0.1–0.9)
Monocytes: 7 %
Neutrophils Absolute: 4.8 10*3/uL (ref 1.4–7.0)
Neutrophils: 78 %
Platelets: 181 10*3/uL (ref 150–450)
RBC: 4.28 x10E6/uL (ref 3.77–5.28)
RDW: 14.3 % (ref 11.7–15.4)
WBC: 6 10*3/uL (ref 3.4–10.8)

## 2020-07-22 LAB — HEPATIC FUNCTION PANEL
ALT: 13 IU/L (ref 0–32)
AST: 14 IU/L (ref 0–40)
Albumin: 4.7 g/dL (ref 3.8–4.9)
Alkaline Phosphatase: 92 IU/L (ref 44–121)
Bilirubin Total: <0.2 mg/dL (ref 0.0–1.2)
Bilirubin, Direct: <0.1 mg/dL (ref 0.00–0.40)
Total Protein: 6.9 g/dL (ref 6.0–8.5)

## 2020-07-28 ENCOUNTER — Ambulatory Visit (HOSPITAL_COMMUNITY)
Admission: RE | Admit: 2020-07-28 | Discharge: 2020-07-28 | Disposition: A | Discharge status: Home / Self Care | Payer: Medicare Other | Source: Ambulatory Visit | Attending: Obstetrics and Gynecology | Admitting: Obstetrics and Gynecology

## 2020-07-28 ENCOUNTER — Other Ambulatory Visit: Payer: Self-pay

## 2020-07-28 DIAGNOSIS — E041 Nontoxic single thyroid nodule: Secondary | ICD-10-CM | POA: Insufficient documentation

## 2020-08-17 NOTE — Assessment & Plan Note (Signed)
Left lumpectomy 06/07/2016: IDC grade 1, 1.1 cm, DCIS intermediate grade, margins negative, 0/2 lymph nodes negative, T1c N0 stage IA  Genetic testing: Negative Oncotype DX score 22, 14% risk of recurrence with tamoxifen alone, intermediate risk Adjuvant radiation therapy 07/29/2016 to 08/27/2016  Treatment: Adjuvant antiestrogen therapy with anastrozole 1 mg by mouth daily startedJanuary 2018  Anastrozole toxicities: The first 2 weeks patient had mood swings. Since then she has been doing quite well. Denies any hot flashes or myalgias.  Surveillance:  1.  Breast exam 08/17/2020: Benign 2. Mammogram 08/13/2019 No evidence of malignancy breast density category C 3.  Bone density: T score -2 osteopenia Recommended calcium and vitamin D and Fosamax.  Patient's 2 sisters all had breast cancer negative for genetics.  So they are looking at their genetic profile and much more detail. Return to clinic in1 yearfor follow-up

## 2020-08-17 NOTE — Progress Notes (Signed)
Patient Care Team: Delorse Limber as PCP - General (Family Medicine) Newman Pies, MD as Consulting Physician (Neurosurgery) Ardis Hughs, MD as Attending Physician (Urology) Arvella Nigh, MD as Consulting Physician (Obstetrics and Gynecology) Nicholas Lose, MD as Consulting Physician (Hematology and Oncology) Fanny Skates, MD as Consulting Physician (General Surgery) Eppie Gibson, MD as Attending Physician (Radiation Oncology) Gardenia Phlegm, NP as Nurse Practitioner (Hematology and Oncology)  DIAGNOSIS:    ICD-10-CM   1. Malignant neoplasm of upper-outer quadrant of left breast in female, estrogen receptor positive ([place])  C50.412    Z17.0     SUMMARY OF ONCOLOGIC HISTORY: Oncology History  Breast cancer of upper-outer quadrant of left female breast ([place]) (Resolved)  05/10/2016 Initial Diagnosis   Left breast biopsy UOQ: IDC with DCIS, grade 1-2, ER 100%, PR 10%, Ki-67 10%, HER-2 negative ratio 1.59   05/10/2016 Mammogram   Left breast: 1.2 cm irregular marginated mass 1:00 position 6 cm from the nipple. Numerous grouped calcifications throughout right breast: Stable compared to prior, Left breast: calcifications stable, T1 cN0 stage IA clinical stage   05/11/2016 Procedure   Genetic testing:negative for mutations within any of 33 genes on the Custom Panel (Breast, Gyn, GI genes plus FLCN due to her personal history of pneumothorax) through [facility]   06/07/2016 Surgery   Left lumpectomy: IDC grade 1, 1.1 cm, DCIS intermediate grade, margins negative, 0/2 lymph nodes negative, T1c N0 stage IA   Malignant neoplasm of upper-outer quadrant of left breast in female, estrogen receptor positive ([place])  05/10/2016 Mammogram   Left breast: 1.2 cm irregular marginated mass 1:00 position 6 cm from the nipple. Numerous grouped calcifications throughout right breast: Stable compared to prior, Left breast: calcifications stable, T1 cN0 stage IA clinical  stage   05/10/2016 Initial Diagnosis   Left breast biopsy UOQ: IDC with DCIS, grade 1-2, ER 100%, PR 10%, Ki-67 10%, HER-2 negative ratio 1.59   05/11/2016 Miscellaneous   Genetic testing:negative for mutations within any of 33 genes on the Custom Panel (Breast, Gyn, GI genes plus FLCN due to her personal history of pneumothorax) through [facility]   05/27/2016 Genetic Testing   Genetic testing was normal, and did not reveal a deleterious mutation in these genes.  Additionally, no variants of uncertain significance ([facility]USes) were found. Genes tested include: APC, ATM, AXIN2, BARD1, BMPR1A, BRCA1, BRCA2, BRIP1, CDH1, CDK4, CDKN2A, CHEK2, EPCAM, FANCC, FLCN, MLH1, MSH2, MSH6, MUTYH, NBN, PALB2, PMS2, POLD1, POLE, PTEN, RAD51C, RAD51D, SCG5/GREM1, SMAD4, STK11, TP53, VHL, and XRCC2     06/07/2016 Surgery   Left lumpectomy: IDC grade 1, 1.1 cm, DCIS intermediate grade, margins negative, 0/2 lymph nodes negative, T1c N0 stage IA   06/19/2016 Oncotype testing   Oncotype DX score 22, intermediate risk, 14% ROR   07/29/2016 - 08/27/2016 Radiation Therapy   Adjuvant radiation therapy (squire): 1. The Left breast was treated to 40.05 Gy in 15 fractions at 2.67 Gy per fraction. 2. The Left breast was boosted to 10 Gy in 5 fractions at 2 Gy per fraction.    09/20/2016 -  Anti-estrogen oral therapy   Letrozole 1 mg daily     CHIEF COMPLIANT: Follow-up of left breast cancer on Letrozole therapy  INTERVAL HISTORY: Stacy Logan is a 54 y.o. with above-mentioned history of left breast cancer treated with lumpectomy, radiation, and who is currently on letrozole therapy. Mammogram on 08/13/19 showed no evidence of malignancy bilaterally. She presents to the clinic today for follow-up.  She does not have any new problems or concerns.  She is tolerating letrozole extremely well.  ALLERGIES:  is allergic to codeine, lamictal [lamotrigine], meperidine, meperidine hcl, and morphine.  MEDICATIONS:    Current Outpatient Medications  Medication Sig Dispense Refill  . amitriptyline (ELAVIL) 25 MG tablet TAKE 1-2 TABLETS (25-50 MG TOTAL) BY MOUTH AT BEDTIME. 180 tablet 3  . ARIPiprazole (ABILIFY) 5 MG tablet TAKE 1 TABLET BY MOUTH EVERY DAY 90 tablet 4  . baclofen (LIORESAL) 10 MG tablet TAKE 1 TABLET BY MOUTH TWICE A DAY 180 tablet 3  . BOTOX 100 units SOLR injection INJECT 100 UNITS  INTRAMUSCULARLY EVERY 3  MONTHS (GIVEN AT  PRESCRIBERS OFFICE, DISCARD UNUSED) 100 Units 1  . diazepam (VALIUM) 5 MG tablet TAKE 1 TAB BY MOUTH EVERY 8 HOURS AS NEEDED FOR SPASMS 90 tablet 2  . gabapentin (NEURONTIN) 300 MG capsule Take 1 capsule (300 mg total) by mouth 4 (four) times daily. 360 capsule 3  . letrozole (FEMARA) 2.5 MG tablet TAKE 1 TABLET BY MOUTH EVERY DAY 90 tablet 3  . methylphenidate (RITALIN) 20 MG tablet Take 1 tablet (20 mg total) by mouth 2 (two) times daily with breakfast and lunch. 60 tablet 0  . OnabotulinumtoxinA (BOTOX IM) Inject into the muscle. Facial to treat MS    . pantoprazole (PROTONIX) 40 MG tablet Take 1 tablet (40 mg total) by mouth daily. 90 tablet 1  . promethazine (PHENERGAN) 25 MG tablet TAKE ONE TABLET BY MOUTH DAILY AS NEEDED FOR NAUSEA 30 tablet 0  . rizatriptan (MAXALT) 5 MG tablet 1 TAB AS NEEDED FOR MIGRAINE. MAY REPEAT IN 2 HRS IF NEEDED. MAX 2 TABS IN24HR OR 2-3 DOSES IN A WK. 10 tablet 5  . TECFIDERA 240 MG CPDR TAKE 1 CAPSULE BY MOUTH  TWICE DAILY 60 capsule 11  . valACYclovir (VALTREX) 1000 MG tablet TAKE 2 TABLETS EVERY 12 HOURS X2 DOSES AS NEEDED FOR COLD SORES 30 tablet 0  . venlafaxine XR (EFFEXOR-XR) 75 MG 24 hr capsule Take 1 capsule (75 mg total) by mouth daily with breakfast. 30 capsule 5   No current facility-administered medications for this visit.    PHYSICAL EXAMINATION: ECOG PERFORMANCE STATUS: 1 - Symptomatic but completely ambulatory  Vitals:   08/18/20 0942  BP: 117/86  Pulse: 87  Resp: 20  Temp: 98.5 F (36.9 C)  SpO2: 99%   Filed  Weights   08/18/20 0942  Weight: 121 lb 4.8 oz (55 kg)    BREAST: No palpable masses or nodules in either right or left breasts. No palpable axillary supraclavicular or infraclavicular adenopathy no breast tenderness or nipple discharge. (exam performed in the presence of a chaperone)  LABORATORY DATA:  I have reviewed the data as listed CMP Latest Ref Rng & Units 07/21/2020 02/01/2019 06/03/2016  Glucose 65 - 99 mg/dL - 107(H) 94  BUN 6 - 24 mg/dL - 9 12  Creatinine 0.57 - 1.00 mg/dL - 0.67 0.73  Sodium 134 - 144 mmol/L - 139 141  Potassium 3.5 - 5.2 mmol/L - 4.5 4.1  Chloride 96 - 106 mmol/L - 100 105  CO2 20 - 29 mmol/L - 24 30  Calcium 8.7 - 10.2 mg/dL - 10.1 9.6  Total Protein 6.0 - 8.5 g/dL 6.9 6.9 7.1  Total Bilirubin 0.0 - 1.2 mg/dL <0.2 <0.2 0.7  Alkaline Phos 44 - 121 IU/L 92 85 75  AST 0 - 40 IU/L _0 ALT 0 - 32 IU/L 13  14 16    Lab Results  Component Value Date   WBC 6.0 07/21/2020   HGB 12.9 07/21/2020   HCT 38.8 07/21/2020   MCV 91 07/21/2020   PLT 181 07/21/2020   NEUTROABS 4.8 07/21/2020    ASSESSMENT & PLAN:  Malignant neoplasm of upper-outer quadrant of left breast in female, estrogen receptor positive ([place]) Left lumpectomy 06/07/2016: IDC grade 1, 1.1 cm, DCIS intermediate grade, margins negative, 0/2 lymph nodes negative, T1c N0 stage IA  Genetic testing: Negative Oncotype DX score 22, 14% risk of recurrence with tamoxifen alone, intermediate risk Adjuvant radiation therapy 07/29/2016 to 08/27/2016  Treatment: Adjuvant antiestrogen therapy with anastrozole 1 mg by mouth daily startedJanuary 2018  Letrozole toxicities:  Surveillance:  1.  Breast exam 08/17/2020: Benign 2. Mammogram 08/13/2019 No evidence of malignancy breast density category C 3.  Bone density: T score -2 osteopenia Recommended calcium and vitamin D and Fosamax.  Return to clinic in1 yearfor follow-up   No orders of the defined types were placed in this  encounter.  The patient has a good understanding of the overall plan. she agrees with it. she will call with any problems that may develop before the next visit here.  Total time spent: 20 mins including face to face time and time spent for planning, charting and coordination of care  Nicholas Lose, MD 08/18/2020  I, Cloyde Reams Dorshimer, am acting as scribe for Dr. Nicholas Lose.  I have reviewed the above documentation for accuracy and completeness, and I agree with the above.

## 2020-08-18 ENCOUNTER — Inpatient Hospital Stay: Payer: Medicare Other | Attending: Hematology and Oncology | Admitting: Hematology and Oncology

## 2020-08-18 ENCOUNTER — Other Ambulatory Visit: Payer: Self-pay

## 2020-08-18 DIAGNOSIS — Z79899 Other long term (current) drug therapy: Secondary | ICD-10-CM | POA: Insufficient documentation

## 2020-08-18 DIAGNOSIS — Z170 Estrogen receptor positive status [ER+]: Secondary | ICD-10-CM | POA: Diagnosis not present

## 2020-08-18 DIAGNOSIS — C50412 Malignant neoplasm of upper-outer quadrant of left female breast: Secondary | ICD-10-CM | POA: Insufficient documentation

## 2020-08-18 DIAGNOSIS — Z79811 Long term (current) use of aromatase inhibitors: Secondary | ICD-10-CM | POA: Diagnosis not present

## 2020-11-12 ENCOUNTER — Other Ambulatory Visit: Payer: Self-pay | Admitting: Neurology

## 2020-12-01 DIAGNOSIS — G35 Multiple sclerosis: Secondary | ICD-10-CM | POA: Diagnosis not present

## 2020-12-01 DIAGNOSIS — H04123 Dry eye syndrome of bilateral lacrimal glands: Secondary | ICD-10-CM | POA: Diagnosis not present

## 2020-12-01 DIAGNOSIS — H2513 Age-related nuclear cataract, bilateral: Secondary | ICD-10-CM | POA: Diagnosis not present

## 2020-12-26 DIAGNOSIS — M25551 Pain in right hip: Secondary | ICD-10-CM | POA: Diagnosis not present

## 2021-01-09 ENCOUNTER — Other Ambulatory Visit: Payer: Self-pay | Admitting: Neurology

## 2021-01-22 ENCOUNTER — Other Ambulatory Visit: Payer: Self-pay | Admitting: Neurology

## 2021-01-22 DIAGNOSIS — R1310 Dysphagia, unspecified: Secondary | ICD-10-CM

## 2021-01-22 DIAGNOSIS — G35 Multiple sclerosis: Secondary | ICD-10-CM

## 2021-01-22 MED ORDER — AMITRIPTYLINE HCL 25 MG PO TABS: 25.0000 mg | ORAL_TABLET | Freq: Every day | ORAL | 0 refills | Status: DC

## 2021-01-22 MED ORDER — METHYLPHENIDATE HCL 20 MG PO TABS: 20.0000 mg | ORAL_TABLET | Freq: Two times a day (BID) | ORAL | 0 refills | Status: DC

## 2021-01-22 MED ORDER — BACLOFEN 10 MG PO TABS: 10.0000 mg | ORAL_TABLET | Freq: Two times a day (BID) | ORAL | 0 refills | Status: DC

## 2021-01-22 NOTE — Telephone Encounter (Signed)
Per Dr. Felecia Shelling, ok to refill until appt. I e-scribed refills for baclofen and amitriptyline. Called pharmacy and spoke w/ Ebony Hail. They have rx gabapentin sent on 05/20/20 #360, 3 refills. They have 1 refill remaining but too soon to refill currently.

## 2021-01-22 NOTE — Telephone Encounter (Signed)
Pt has scheduled her annual f/u and is on wait list, pt is asking for a refill on gabapentin (NEURONTIN) 300 MG capsule, baclofen (LIORESAL) 10 MG tablet, amitriptyline (ELAVIL) 25 MG tablet, methylphenidate (RITALIN) 20 MG tablet  to [id number]

## 2021-01-22 NOTE — Addendum Note (Signed)
Addended by: Roberts Gaudy L on: 01/22/2021 09:30 AM   Modules accepted: Orders

## 2021-01-22 NOTE — Telephone Encounter (Addendum)
Reviewed pt chart, she is r/s for 05/13/21. Last seen 07/21/2020 for botox.  Checked drug registry, she last refilled methylphenidate 04/16/21 #60.

## 2021-02-12 ENCOUNTER — Other Ambulatory Visit: Payer: Self-pay | Admitting: Neurology

## 2021-02-25 DIAGNOSIS — R311 Benign essential microscopic hematuria: Secondary | ICD-10-CM | POA: Diagnosis not present

## 2021-03-02 ENCOUNTER — Other Ambulatory Visit: Payer: Self-pay

## 2021-03-02 ENCOUNTER — Encounter (HOSPITAL_BASED_OUTPATIENT_CLINIC_OR_DEPARTMENT_OTHER): Payer: Self-pay | Admitting: *Deleted

## 2021-03-02 ENCOUNTER — Emergency Department (HOSPITAL_BASED_OUTPATIENT_CLINIC_OR_DEPARTMENT_OTHER)
Admission: EM | Admit: 2021-03-02 | Discharge: 2021-03-02 | Disposition: A | Discharge status: Home / Self Care | Payer: Medicare Other | Attending: Emergency Medicine | Admitting: Emergency Medicine

## 2021-03-02 DIAGNOSIS — Z87891 Personal history of nicotine dependence: Secondary | ICD-10-CM | POA: Insufficient documentation

## 2021-03-02 DIAGNOSIS — Z853 Personal history of malignant neoplasm of breast: Secondary | ICD-10-CM | POA: Diagnosis not present

## 2021-03-02 DIAGNOSIS — T83098A Other mechanical complication of other indwelling urethral catheter, initial encounter: Secondary | ICD-10-CM | POA: Diagnosis not present

## 2021-03-02 DIAGNOSIS — R309 Painful micturition, unspecified: Secondary | ICD-10-CM | POA: Diagnosis not present

## 2021-03-02 DIAGNOSIS — T83091A Other mechanical complication of indwelling urethral catheter, initial encounter: Secondary | ICD-10-CM | POA: Diagnosis not present

## 2021-03-02 DIAGNOSIS — T839XXA Unspecified complication of genitourinary prosthetic device, implant and graft, initial encounter: Secondary | ICD-10-CM

## 2021-03-02 DIAGNOSIS — R1030 Lower abdominal pain, unspecified: Secondary | ICD-10-CM | POA: Insufficient documentation

## 2021-03-02 LAB — CBC WITH DIFFERENTIAL/PLATELET
Abs Immature Granulocytes: 0.05 10*3/uL (ref 0.00–0.07)
Basophils Absolute: 0 10*3/uL (ref 0.0–0.1)
Basophils Relative: 0 %
Eosinophils Absolute: 0 10*3/uL (ref 0.0–0.5)
Eosinophils Relative: 0 %
HCT: 33.3 % — ABNORMAL LOW (ref 36.0–46.0)
Hemoglobin: 11.7 g/dL — ABNORMAL LOW (ref 12.0–15.0)
Immature Granulocytes: 1 %
Lymphocytes Relative: 8 %
Lymphs Abs: 0.7 10*3/uL (ref 0.7–4.0)
MCH: 30.3 pg (ref 26.0–34.0)
MCHC: 35.1 g/dL (ref 30.0–36.0)
MCV: 86.3 fL (ref 80.0–100.0)
Monocytes Absolute: 1 10*3/uL (ref 0.1–1.0)
Monocytes Relative: 10 %
Neutro Abs: 7.4 10*3/uL (ref 1.7–7.7)
Neutrophils Relative %: 81 %
Platelets: 242 10*3/uL (ref 150–400)
RBC: 3.86 MIL/uL — ABNORMAL LOW (ref 3.87–5.11)
RDW: 15.1 % (ref 11.5–15.5)
WBC: 9.2 10*3/uL (ref 4.0–10.5)
nRBC: 0 % (ref 0.0–0.2)

## 2021-03-02 LAB — URINALYSIS, ROUTINE W REFLEX MICROSCOPIC
Bilirubin Urine: NEGATIVE
Glucose, UA: NEGATIVE mg/dL
Ketones, ur: NEGATIVE mg/dL
Nitrite: NEGATIVE
Protein, ur: NEGATIVE mg/dL
Specific Gravity, Urine: 1.015 (ref 1.005–1.030)
pH: 6 (ref 5.0–8.0)

## 2021-03-02 LAB — BASIC METABOLIC PANEL
Anion gap: 7 (ref 5–15)
BUN: 11 mg/dL (ref 6–20)
CO2: 27 mmol/L (ref 22–32)
Calcium: 9.5 mg/dL (ref 8.9–10.3)
Chloride: 100 mmol/L (ref 98–111)
Creatinine, Ser: 0.54 mg/dL (ref 0.44–1.00)
GFR, Estimated: >60 mL/min (ref >60)
Glucose, Bld: 132 mg/dL — ABNORMAL HIGH (ref 70–99)
Potassium: 3.8 mmol/L (ref 3.5–5.1)
Sodium: 134 mmol/L — ABNORMAL LOW (ref 135–145)

## 2021-03-02 LAB — URINALYSIS, MICROSCOPIC (REFLEX)

## 2021-03-02 LAB — LACTIC ACID, PLASMA: Lactic Acid, Venous: 0.8 mmol/L (ref 0.5–1.9)

## 2021-03-02 MED ORDER — PHENAZOPYRIDINE HCL 100 MG PO TABS
100.0000 mg | ORAL_TABLET | Freq: Once | ORAL | Status: AC
  Administered 2021-03-02: 100 mg via ORAL
  Filled 2021-03-02: qty 1

## 2021-03-02 MED ORDER — PHENAZOPYRIDINE HCL 200 MG PO TABS: 200.0000 mg | ORAL_TABLET | Freq: Three times a day (TID) | ORAL | 0 refills | Status: DC

## 2021-03-02 NOTE — ED Triage Notes (Signed)
States she has a hx of MS. She has a foley cath that is leaking. She needs cath to be changed.

## 2021-03-02 NOTE — ED Provider Notes (Signed)
[facility] Provider Note   CSN: [id number] Arrival date & time: 03/02/21  1937     History Chief Complaint  Patient presents with   Foley Leaking    Stacy Logan is a 55 y.o. female with history of MS presents to the ED for evaluation of Foley malfunction.  Patient states she started having an MS flare 3 weeks ago.  Typically her MS flares cause her to have more frequent falls, profound fatigue and bladder incontinence.  She typically needs to have a Foley catheter in place for bladder incontinence.  She started having bladder incontinence last Wednesday as well as burning with urination, vaginal discomfort.  She had a Foley inserted on Wednesday and told that she had a UTI and prescribed Macrobid.  This was done at [facility] urology with Dr. Louis Meckel.  Reports over the last 24 hours she has had increased lower abdominal and vaginal discomfort specifically with urination.  She feels like her lower abdomen is distended and bloated.  Has noticed that her urine output has decreased as well as her urine is darker.  This afternoon she had leaking of urine around the Foley.  Has noticed mild discomfort in both of her kidneys.  No fever, nausea, vomiting.  States in the past her MS flares have caused her bladder and her uterus to get very "tight".  She is afraid that the [place] is not working.  Wonders if her Foley needs to be flushed.  She has been prescribed intravaginal Valium to help with her symptoms, states she tried to insert 1 earlier today but could not even get it up due to vaginal tightness.  Patient states she does not get prescribed prednisone or other steroids during an MS flare because her neurologist told her that her spinal cord is so thin that she cannot take prednisone.  HPI     Past Medical History:  Diagnosis Date   Anal fissure    Anxiety    Arthritis    Borderline diabetes    Cancer ([place])    left breast cancer   Depression     Frequency of urination    H/O cold sores    History of pneumothorax    1988-- SPONTANEOUS--  RESOLVED W/ CHEST TUBE   History of radiation therapy 07/29/16- 08/27/16   Left Breast 40.05 Gy in 15 fractions, Left Breast Boost 10 Gy in 5 fractions.    IC (interstitial cystitis)    Internal hemorrhoids    Lesion of bladder    Migraines    MS (multiple sclerosis) ([place])    MRI showed plague on her brain   Nocturia    Personal history of radiation therapy    Preeclampsia 1994   RSD (reflex sympathetic dystrophy)    Seasonal allergies    Tubular adenoma of colon    Urgency of urination     Patient Active Problem List   Diagnosis Date Noted   Chronic migraine w/o aura, not intractable, w/o stat migr 07/21/2020   High risk medication use 04/16/2020   Gait disturbance 02/23/2017   Displaced fracture of fifth metatarsal bone, left foot, initial encounter for open fracture 02/23/2017   Elbow pain, chronic, right 02/23/2017   History of foot fracture 02/23/2017   Malignant neoplasm of upper-outer quadrant of left breast in female, estrogen receptor positive ([place]) 07/21/2016   Genetic testing 06/27/2016   Blepharospasm 05/19/2016   Hemifacial spasm 05/19/2016   Numbness 11/04/2015   Ulnar neuritis 11/04/2015  Physical exam 05/15/2015   Dysesthesia 04/15/2015   Other fatigue 04/15/2015   Urge incontinence 04/15/2015   Insomnia 04/15/2015   Memory disturbance 04/15/2015   Bilateral arm weakness 03/28/2015   Interstitial cystitis 03/28/2015   Influenza with respiratory manifestation other than pneumonia 11/29/2014   Sore in nose 10/16/2014   Thrush 08/22/2014   Acne cystica 07/31/2014   Cervical spondylosis with myelopathy and radiculopathy 07/03/2014   Hearing loss in left ear 05/30/2014   Special screening for malignant neoplasms, colon 05/02/2014   Unspecified constipation 05/02/2014   Chronic migraine 04/23/2014   Loss of weight 04/11/2014   MS (multiple sclerosis) ([place])  04/11/2014   Diffuse pain 04/11/2014   Glucosuria 01/16/2014   Depression with anxiety 01/16/2014   Recurrent cold sores 01/16/2014   HYPOKALEMIA 10/02/2007   ANEMIA, IRON DEFICIENCY 10/02/2007   UTI 10/02/2007   URI 08/08/2007   CONTUSION OF UNSPECIFIED SITE 08/01/2007    Past Surgical History:  Procedure Laterality Date   ANTERIOR CERVICAL DECOMPRESSION/DISCECTOMY FUSION 4 LEVELS N/A 07/03/2014   Procedure: ANTERIOR CERVICAL DECOMPRESSION/DISCECTOMY FUSION 4 LEVELS;  Surgeon: Newman Pies, MD;  Location: [facility];  Service: Neurosurgery;  Laterality: N/A;  C3-4 C4-5 C5-6 C6-7 Anterior cervical decompression/diskectomy/fusion/interbody prosthesis/plate   APPENDECTOMY     CESAREAN SECTION  1994   CYSTOSCOPY WITH HYDRODISTENSION AND BIOPSY Bilateral 02/15/2014   Procedure: CYSTOSCOPY  BILATERAL RETROGRADE PYLOGRAM, HYDRODISTENSION, INSTILATION OF MARCAINE AND PYRIDIUM;  Surgeon: Ardis Hughs, MD;  Location: [facility];  Service: Urology;  Laterality: Bilateral;   D & C HYSTEROSCOPY WITH POLYPECTOMY  12-02-2003   DILATION AND CURETTAGE OF UTERUS     DX LAPAROSCOPY/  FULGERATION ENDOMETRIOSIS/  APPENDECTOMY  1985   RADIOACTIVE SEED GUIDED PARTIAL MASTECTOMY WITH AXILLARY SENTINEL LYMPH NODE BIOPSY Left 06/07/2016   Procedure: BREAST LUMPECTOMY WITH RADIOACTIVE SEED AND SENTINEL LYMPH NODE BIOPSY, INJECT BLUE DYE LEFT BREAST;  Surgeon: Fanny Skates, MD;  Location: [facility];  Service: General;  Laterality: Left;   TONSILLECTOMY AND ADENOIDECTOMY  1972     OB History   No obstetric history on file.     Family History  Problem Relation Age of Onset   Hypertension Mother    Diabetes Father    Prostate cancer Father        dx late 6s   Lung cancer Father 72       smoker   Cancer Father        cheek of mouth, dx. between 19 and 28   Diabetes Paternal Grandmother    Diabetes Paternal Grandfather    Aneurysm Maternal Grandmother 69        d. brain aneurysm   Prostate cancer Maternal Grandfather    Diabetes Paternal Uncle        x 4   Diabetes Paternal Aunt    Colon cancer Maternal Uncle    Cancer Maternal Uncle        mouth cancer; +tobacco   Breast cancer Sister 65       s/p mastectomy   Breast cancer Maternal Aunt        dx 80s   Lymphoma Maternal Aunt 83   Uterine cancer Maternal Aunt        d. late 80s   Throat cancer Cousin        maternal 1st cousin; lim info   Cancer Paternal Uncle        NOS cancer   Breast cancer Cousin  paternal 1st cousin dx 65s   Esophageal cancer Neg Hx    Rectal cancer Neg Hx    Stomach cancer Neg Hx     Social History   Tobacco Use   Smoking status: Former    Packs/day: 0.25    Years: 27.00    Pack years: 6.75    Types: Cigarettes    Quit date: 01/17/2011    Years since quitting: 10.1   Smokeless tobacco: Never  Vaping Use   Vaping Use: Never used  Substance Use Topics   Alcohol use: No   Drug use: No    Home Medications Prior to Admission medications   Medication Sig Start Date End Date Taking? Authorizing Provider  phenazopyridine (PYRIDIUM) 200 MG tablet Take 1 tablet (200 mg total) by mouth 3 (three) times daily. 03/02/21  Yes Kinnie Feil, PA-C  amitriptyline (ELAVIL) 25 MG tablet Take 1-2 tablets (25-50 mg total) by mouth at bedtime. 01/22/21   Sater, Nanine Means, MD  ARIPiprazole (ABILIFY) 5 MG tablet TAKE 1 TABLET BY MOUTH EVERY DAY 05/19/20   Sater, Nanine Means, MD  baclofen (LIORESAL) 10 MG tablet Take 1 tablet (10 mg total) by mouth 2 (two) times daily. 01/22/21   Sater, Nanine Means, MD  BOTOX 100 units SOLR injection INJECT 100 UNITS  INTRAMUSCULARLY EVERY 3  MONTHS (GIVEN AT  PRESCRIBERS OFFICE, DISCARD UNUSED) 10/25/18   Sater, Nanine Means, MD  diazepam (VALIUM) 5 MG tablet TAKE 1 TABLET BY MOUTH EVERY 8 HOURS AS NEEDED FOR SPASMS 11/12/20   Star Age, MD  gabapentin (NEURONTIN) 300 MG capsule Take 1 capsule (300 mg total) by mouth 4 (four) times daily.  05/20/20   Sater, Nanine Means, MD  letrozole [facility]) 2.5 MG tablet TAKE 1 TABLET BY MOUTH EVERY DAY 07/18/20   Nicholas Lose, MD  methylphenidate (RITALIN) 20 MG tablet Take 1 tablet (20 mg total) by mouth 2 (two) times daily with breakfast and lunch. 01/22/21   Sater, Nanine Means, MD  OnabotulinumtoxinA (BOTOX IM) Inject into the muscle. Facial to treat MS    [provider]  pantoprazole (PROTONIX) 40 MG tablet Take 1 tablet (40 mg total) by mouth daily. 06/02/17   Pyrtle, Lajuan Lines, MD  promethazine (PHENERGAN) 25 MG tablet TAKE ONE TABLET BY MOUTH DAILY AS NEEDED FOR NAUSEA 05/31/19   Sater, Nanine Means, MD  rizatriptan (MAXALT) 5 MG tablet 1 TAB AS NEEDED FOR MIGRAINE. MAY REPEAT IN 2 HRS IF NEEDED. MAX 2 TABS IN24HR OR 2-3 DOSES IN A WK. 02/17/21   Sater, Nanine Means, MD  TECFIDERA 240 MG CPDR TAKE 1 CAPSULE BY MOUTH  TWICE DAILY 06/18/20   Sater, Nanine Means, MD  valACYclovir (VALTREX) 1000 MG tablet TAKE 2 TABLETS EVERY 12 HOURS X2 DOSES AS NEEDED FOR COLD SORES 04/18/17   Brunetta Jeans, PA-C  venlafaxine XR (EFFEXOR-XR) 75 MG 24 hr capsule Take 1 capsule (75 mg total) by mouth daily with breakfast. 07/16/15   Sater, Nanine Means, MD    Allergies    Codeine, Lamictal [lamotrigine], Meperidine, Meperidine hcl, and Morphine  Review of Systems   Review of Systems  Gastrointestinal:  Positive for abdominal pain.  Genitourinary:  Positive for difficulty urinating, dysuria and flank pain.  Musculoskeletal:  Positive for back pain.  All other systems reviewed and are negative.  Physical Exam Updated Vital Signs BP 118/83 (BP Location: Right Arm)   Pulse (!) 104   Temp 99.7 F (37.6 C) (Oral)   Resp 17  Ht 5' 9.75" (1.772 m)   Wt 55 kg   SpO2 97%   BMI 17.52 kg/m   Physical Exam Vitals and nursing note reviewed.  Constitutional:      General: She is not in acute distress.    Appearance: She is well-developed.     Comments: NAD.  HENT:     Head: Normocephalic and atraumatic.     Right  Ear: External ear normal.     Left Ear: External ear normal.     Nose: Nose normal.  Eyes:     General: No scleral icterus.    Conjunctiva/sclera: Conjunctivae normal.  Cardiovascular:     Rate and Rhythm: Normal rate and regular rhythm.     Heart sounds: Normal heart sounds. No murmur heard. Pulmonary:     Effort: Pulmonary effort is normal.     Breath sounds: Normal breath sounds. No wheezing.  Abdominal:     Palpations: Abdomen is soft.     Tenderness: There is no abdominal tenderness.     Comments: Patient states she typically cannot feel pain in the lower abdomen.  Abdomen is soft.  No obvious distention.  No CVA tenderness.  Genitourinary:    Comments: Foley in place.  No leaking of urine noted.  Approximately 1 L of dark yellow urine noted in Foley bag.  Bedside bladder scan performed by RN - no retention.  Musculoskeletal:        General: No deformity. Normal range of motion.     Cervical back: Normal range of motion and neck supple.  Skin:    General: Skin is warm and dry.     Capillary Refill: Capillary refill takes less than 2 seconds.  Neurological:     Mental Status: She is alert and oriented to person, place, and time.  Psychiatric:        Behavior: Behavior normal.        Thought Content: Thought content normal.        Judgment: Judgment normal.    ED Results / Procedures / Treatments   Labs (all labs ordered are listed, but only abnormal results are displayed) Labs Reviewed  CBC WITH DIFFERENTIAL/PLATELET - Abnormal; Notable for the following components:      Result Value   RBC 3.86 (*)    Hemoglobin 11.7 (*)    HCT 33.3 (*)    All other components within normal limits  BASIC METABOLIC PANEL - Abnormal; Notable for the following components:   Sodium 134 (*)    Glucose, Bld 132 (*)    All other components within normal limits  URINALYSIS, ROUTINE W REFLEX MICROSCOPIC - Abnormal; Notable for the following components:   APPearance HAZY (*)    Hgb urine  dipstick MODERATE (*)    Leukocytes,Ua SMALL (*)    All other components within normal limits  URINALYSIS, MICROSCOPIC (REFLEX) - Abnormal; Notable for the following components:   Bacteria, UA MANY (*)    All other components within normal limits  URINE CULTURE  LACTIC ACID, PLASMA  LACTIC ACID, PLASMA    EKG None  Radiology No results found.  Procedures Procedures   Medications Ordered in ED Medications  phenazopyridine (PYRIDIUM) tablet 100 mg (has no administration in time range)    ED Course  I have reviewed the triage vital signs and the nursing notes.  Pertinent labs & imaging results that were available during my care of the patient were reviewed by me and considered in my medical decision making (see  chart for details).  Clinical Course as of 03/02/21 2251  Mon Mar 02, 2021  2115 WBC: 9.2 [CG]  2115 Lactic Acid, Venous: 0.8 [CG]  2115 Chalmers Guest): SMALL [CG]  2115 RBC / HPF: 6-10 [CG]  2115 WBC, UA: 6-10 [CG]  2115 Bacteria, UA(!): MANY [CG]  2115 Patient reevaluated.  Reports complete resolution of vaginal and abdominal discomfort.  She has had significant urine output after Foley has been flushed.  Reevaluated patientRe-evaluated patient after foley was flushed. Has had improvement in vaginal/lower abdominal discomfort. Has had significant urine output since with no discomfort.  [CG]    Clinical Course User Index [CG] Arlean Hopping   MDM Rules/Calculators/A&P                          55 year old female with history of MS with bladder incontinence during MS flares requiring temporary Foley presents to the ED for changes in urine output from foley, dark urine, vaginal and lower abdominal discomfort.  Had Foley placed by urology last Wednesday and started on Macrobid.  EMR, triage nursing notes reviewed.  I am unable to review urology notes.  Labs ordered and reviewed by me.  Labs reveal - normal WBC and lactic acid.  Urinalysis only with  small leukocytes, 6-10 WBC and many bacteria however this is urine sample from Foley bag.  Imaging reveals - none.   Medicines given in the ED-Pyridium  ED course and MDM 2245: Foley was flushed by RN.  After Foley was flushed patient had significant urine output without discomfort.  Her symptoms had almost completely resolved on reevaluation.   Given resolution of symptoms, reassuring work-up, feel patient can be discharged at this time.  She was tachycardic on arrival but she was uncomfortable on arrival.  After she had normal urine output and resolution of her symptoms her heart rate significantly improved.  Suspect there may have been some debris/malfunction of the Foley.  Offered to change patient's antibiotic from Macrobid to Keflex but patient stated that she thinks her symptoms were likely from a Foley malfunction and once well and feels comfortable continuing her Macrobid.  She has a urology follow-up appointment in 48 hours.  I do not think we need to change her Foley as it appears to be working now.  No fever, white count, lactic acidosis to suggest systemic infection.  Will discharge with Pyridium and close urology follow-up.  Discussed with the repeat Final Clinical Impression(s) / ED Diagnoses Final diagnoses:  Complication of Foley catheter, initial encounter ([place])  Pain with urination    Rx / DC Orders ED Discharge Orders          Ordered    phenazopyridine (PYRIDIUM) 200 MG tablet  3 times daily        03/02/21 2250             Arlean Hopping 03/02/21 2251    Quintella Reichert, MD 03/05/21 [phone]

## 2021-03-02 NOTE — Discharge Instructions (Addendum)
You were seen in the emergency department for problems with your Foley and pain with urination  Lab work was normal.  Urinalysis showed some inflammatory cells and bacteria in your urine but this can frequently be seen with urine from a Foley  Your Foley was flushed and you had improvement in her symptoms and urine output.  We deferred exchanging your Foley.    Continue your Macrobid.  I prescribed Pyridium which can help with bladder discomfort and spasms.  Monitor your urine output.  Follow-up with your urology appointment on Wednesday.  Return to the ED for return of symptoms, no urine output, significant bladder discomfort, fever, nausea, vomiting

## 2021-03-02 NOTE — ED Notes (Signed)
Bladder scanned, 47ml in bladder x3. Irrigated foley without difficulty, returned clear fluid

## 2021-03-03 ENCOUNTER — Other Ambulatory Visit: Payer: Self-pay | Admitting: Obstetrics and Gynecology

## 2021-03-03 ENCOUNTER — Other Ambulatory Visit: Payer: Self-pay | Admitting: Adult Health

## 2021-03-03 DIAGNOSIS — Z1231 Encounter for screening mammogram for malignant neoplasm of breast: Secondary | ICD-10-CM

## 2021-03-04 ENCOUNTER — Other Ambulatory Visit: Payer: Self-pay | Admitting: Neurology

## 2021-03-04 DIAGNOSIS — R1310 Dysphagia, unspecified: Secondary | ICD-10-CM

## 2021-03-04 DIAGNOSIS — G35 Multiple sclerosis: Secondary | ICD-10-CM

## 2021-03-04 DIAGNOSIS — N3281 Overactive bladder: Secondary | ICD-10-CM | POA: Diagnosis not present

## 2021-03-04 LAB — URINE CULTURE

## 2021-03-06 ENCOUNTER — Other Ambulatory Visit: Payer: Self-pay

## 2021-03-06 ENCOUNTER — Other Ambulatory Visit: Payer: Self-pay | Admitting: Obstetrics and Gynecology

## 2021-03-06 ENCOUNTER — Ambulatory Visit
Admission: EM | Admit: 2021-03-06 | Discharge: 2021-03-06 | Discharge status: Against Medical Advice | Payer: Medicare Other

## 2021-03-06 DIAGNOSIS — Z853 Personal history of malignant neoplasm of breast: Secondary | ICD-10-CM

## 2021-03-25 DIAGNOSIS — N3281 Overactive bladder: Secondary | ICD-10-CM | POA: Diagnosis not present

## 2021-03-30 ENCOUNTER — Telehealth: Payer: Self-pay | Admitting: Neurology

## 2021-03-30 ENCOUNTER — Other Ambulatory Visit: Payer: Self-pay | Admitting: Neurology

## 2021-03-30 DIAGNOSIS — G35 Multiple sclerosis: Secondary | ICD-10-CM

## 2021-03-30 DIAGNOSIS — R1310 Dysphagia, unspecified: Secondary | ICD-10-CM

## 2021-03-30 MED ORDER — BACLOFEN 10 MG PO TABS: 10.0000 mg | ORAL_TABLET | Freq: Two times a day (BID) | ORAL | 0 refills | Status: DC

## 2021-03-30 MED ORDER — GABAPENTIN 300 MG PO CAPS: 300.0000 mg | ORAL_CAPSULE | Freq: Four times a day (QID) | ORAL | 0 refills | Status: DC

## 2021-03-30 NOTE — Telephone Encounter (Signed)
Pt states she has been told an authorization is needed on her  baclofen (LIORESAL) 10 MG tablet, diazepam (VALIUM) 5 MG tablet & gabapentin (NEURONTIN) 300 MG capsule  in order to get a refill sent to [id number]

## 2021-03-30 NOTE — Telephone Encounter (Signed)
I have routed this request for diazepam to Dr Felecia Shelling for review. The pt is due for the medication and [place] registry was verified. Last filled 11/12/2020

## 2021-03-30 NOTE — Telephone Encounter (Signed)
Refills sent to the pharmacy for the patient.

## 2021-03-31 DIAGNOSIS — Z779 Other contact with and (suspected) exposures hazardous to health: Secondary | ICD-10-CM | POA: Diagnosis not present

## 2021-04-27 ENCOUNTER — Other Ambulatory Visit: Payer: Self-pay

## 2021-04-27 ENCOUNTER — Ambulatory Visit
Admission: RE | Admit: 2021-04-27 | Discharge: 2021-04-27 | Disposition: A | Discharge status: Home / Self Care | Payer: Medicare Other | Source: Ambulatory Visit | Attending: Obstetrics and Gynecology | Admitting: Obstetrics and Gynecology

## 2021-04-27 DIAGNOSIS — Z853 Personal history of malignant neoplasm of breast: Secondary | ICD-10-CM

## 2021-04-27 DIAGNOSIS — R922 Inconclusive mammogram: Secondary | ICD-10-CM | POA: Diagnosis not present

## 2021-05-13 ENCOUNTER — Encounter: Payer: Self-pay | Admitting: Neurology

## 2021-05-13 ENCOUNTER — Ambulatory Visit (INDEPENDENT_AMBULATORY_CARE_PROVIDER_SITE_OTHER): Payer: Medicare Other | Admitting: Neurology

## 2021-05-13 VITALS — BP 106/68 | HR 93 | Ht 70.0 in | Wt 114.0 lb

## 2021-05-13 DIAGNOSIS — C50412 Malignant neoplasm of upper-outer quadrant of left female breast: Secondary | ICD-10-CM | POA: Diagnosis not present

## 2021-05-13 DIAGNOSIS — G43709 Chronic migraine without aura, not intractable, without status migrainosus: Secondary | ICD-10-CM | POA: Diagnosis not present

## 2021-05-13 DIAGNOSIS — F418 Other specified anxiety disorders: Secondary | ICD-10-CM

## 2021-05-13 DIAGNOSIS — Z170 Estrogen receptor positive status [ER+]: Secondary | ICD-10-CM

## 2021-05-13 DIAGNOSIS — G35 Multiple sclerosis: Secondary | ICD-10-CM

## 2021-05-13 DIAGNOSIS — Z79899 Other long term (current) drug therapy: Secondary | ICD-10-CM | POA: Diagnosis not present

## 2021-05-13 DIAGNOSIS — G5131 Clonic hemifacial spasm, right: Secondary | ICD-10-CM

## 2021-05-13 DIAGNOSIS — R269 Unspecified abnormalities of gait and mobility: Secondary | ICD-10-CM | POA: Diagnosis not present

## 2021-05-13 DIAGNOSIS — R208 Other disturbances of skin sensation: Secondary | ICD-10-CM

## 2021-05-13 MED ORDER — AMITRIPTYLINE HCL 25 MG PO TABS: 25.0000 mg | ORAL_TABLET | Freq: Every day | ORAL | 3 refills | Status: DC

## 2021-05-13 MED ORDER — BACLOFEN 10 MG PO TABS: 10.0000 mg | ORAL_TABLET | Freq: Two times a day (BID) | ORAL | 0 refills | Status: DC

## 2021-05-13 MED ORDER — METHYLPHENIDATE HCL 20 MG PO TABS: 20.0000 mg | ORAL_TABLET | Freq: Two times a day (BID) | ORAL | 0 refills | Status: DC

## 2021-05-13 MED ORDER — ARIPIPRAZOLE 5 MG PO TABS: 5.0000 mg | ORAL_TABLET | Freq: Every day | ORAL | 4 refills | Status: DC

## 2021-05-13 NOTE — Progress Notes (Signed)
[facility]  PATIENT: Stacy Logan DOB: 03/20/1966  REFERRING DOCTOR OR PCP:  Annye Asa SOURCE: patient, EMR records from [facility].  MRI images on PACS  _________________________________   HISTORICAL  CHIEF COMPLAINT:  Chief Complaint  Patient presents with   Follow-up    Rm 2,alone. Here for annual MS f/u, on Tecfidera. Pt reports having a flare up, had to use a catheter for 7 days, 7 weeks ago.     HISTORY OF PRESENT ILLNESS:  Stacy Logan is a 55 y.o. 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   She also has migraine headaches    Update 05/13/2021: She had a two week episode of extreme fatigue and also had a UTI and urinary incontinence.     She also had bladder pain.     She had her last Botox for hemifacial spasm and chronic migraines November 2021.   She is having insurnace issues.      She is reporting a HA right now but has generally done better with Botox - often the last 1-2 weeks of each cycle are worse.     Facial twitching has done well since the last Botox series with only a few tics.     Until the recent event with fatigue and incontinence, her MS was stable on Tecfidera and she tolerates to well.   Lymphocyte counts were 0.7 03/02/2021.    She is noting more numbness and weakness in both hands.  Bladder function is fine again,    Mood has done well on Abilify, Effexor and diazepam.    She sleeps well most nights.   Ritalin has helped her focus/attention and fatigue  She continues to have some dysesthesias in the hands and feet.  This is stable.  MS/spine history:   In 2001, she was noted to have left hearing loss and had an MRI of the brain.   She did not have numbness or weakness a that time.  She was told she likely had MS.   She saw a neurologist.  She never had an LP.   She reports she was told she had MS but was never started on any medication.   Starting about 5-6  years ago, she was noting more difficulty with gait and also noted some memory issues.   She was also having bladder issues with urgency and bladder incontinence x 5 years.      However, she had no health insurance at that time and did not follow up with neurology.   She saw Dr. Tomi Likens last year.   He ordered an MRI of the brian and cervical spine.   She had one enhancing lesion on the spine and one additional lesion.  An LP was ordered but she opted not to do the test.   She was started on tecfidera.   She has had some itching and flushing, especially if she does not take after food.     She also was found to have severe spinal stenosis and myelopathy.   She was sent to Dr. Arnoldo Morale of Neurosurgery.   She underwent  ACDF from C3-C7 06/2014.    She reports she is having more trouble with her arms since the surgery.   The worse pain is in the 2nd and 3rd fingers but she has altered sensation with some pain in the other fingers as well.   She also has right > left lower neck pain.  Bladder  is about the same (maybe slightly better) as it was before surgery.       Breast Ca:   She was diagnosed with breast cancer and had surgery, RadRx and chemo.  She remains in remission.  MRI images:  MRI of the brain dated 01/12/2015 and the MRIs of the cervical spine dated 02/01/2015 and 05/22/2014 were reviewed.   The MRI of the brain shows white matter foci predominantly in the deep white matter but also in the periventricular and subcortical white matter of both hemispheres. The brainstem appears normal. There is no significant atrophy. The MRI of the cervical spine from 2015 showed severe spinal stenosis at C5-C6 and milder spinal stenosis at C6-C7 and C4-C5. There is an enhancing focus within the spinal cord adjacent to C5-C6 and she has another hyperintense focus at C3-C4. On the second MRI, done without contrast, she is status post C3-C7 ACDF.     05/13/2018 MRI of the brain and cervical spine: The MRI of the brain shows  multiple T2/FLAIR hyperintense foci in the hemispheres and in the pons.  The overall extent is similar to what was noted on the 01/12/2015 MRI.  Although some foci are consistent with multiple sclerosis, others would be more likely due to chronic microvascular ischemic changes.  None of the lesions appear to be acute.  MRI of the cervical spine shows T2 hyperintense foci within the spinal cord centrally adjacent to C3 and adjacent to C4-C5.  There are bilateral hyperintense foci, right greater than left and possibly within the anterior horns, adjacent to C6.  The C3 and C4-C5 foci are nonspecific and could be due to demyelination but could also be the sequela of compressive myelopathy.  The changes at C5-C6 are more consistent with the sequela of compressive myelopathy.   REVIEW OF SYSTEMS: Constitutional: No fevers, chills, sweats, or change in appetite Eyes: as above Ear, nose and throat: No hearing loss, ear pain, nasal congestion, sore throat Cardiovascular: No chest pain, palpitations Respiratory:  No shortness of breath at rest or with exertion.   No wheezes GastrointestinaI: No nausea, vomiting, diarrhea, abdominal pain, fecal incontinence Genitourinary:  see above Musculoskeletal:  Some neck pain, back pain Integumentary: No rash, pruritus, skin lesions Neurological: as above Psychiatric: Notes depression and anxiety Endocrine: No palpitations, diaphoresis, change in appetite, change in weigh or increased thirst Hematologic/Lymphatic:  No anemia, purpura, petechiae. Allergic/Immunologic: No itchy/runny eyes, nasal congestion, recent allergic reactions, rashes  ALLERGIES: Allergies  Allergen Reactions   Codeine     Makes her "busy", itchy   Lamictal [Lamotrigine] Other (See Comments)    Mood changes    Meperidine Nausea And Vomiting   Meperidine Hcl Nausea And Vomiting    SEVERE N/V   Morphine Other (See Comments)    "SKIN CRAWLS"    HOME MEDICATIONS:  Current Outpatient  Medications:    BOTOX 100 units SOLR injection, INJECT 100 UNITS  INTRAMUSCULARLY EVERY 3  MONTHS (GIVEN AT  PRESCRIBERS OFFICE, DISCARD UNUSED), Disp: 100 Units, Rfl: 1   diazepam (VALIUM) 5 MG tablet, TAKE 1 TABLET BY MOUTH EVERY 8 HOURS AS NEEDED FOR SPASMS, Disp: 90 tablet, Rfl: 0   gabapentin (NEURONTIN) 300 MG capsule, TAKE 1 CAPSULE (300 MG TOTAL) BY MOUTH 4 (FOUR) TIMES DAILY., Disp: 360 capsule, Rfl: 3   gabapentin (NEURONTIN) 300 MG capsule, Take 1 capsule (300 mg total) by mouth 4 (four) times daily., Disp: 360 capsule, Rfl: 0   letrozole (FEMARA) 2.5 MG tablet, TAKE 1 TABLET BY  MOUTH EVERY DAY, Disp: 90 tablet, Rfl: 3   OnabotulinumtoxinA (BOTOX IM), Inject into the muscle. Facial to treat MS, Disp: , Rfl:    pantoprazole (PROTONIX) 40 MG tablet, Take 1 tablet (40 mg total) by mouth daily., Disp: 90 tablet, Rfl: 1   phenazopyridine (PYRIDIUM) 200 MG tablet, Take 1 tablet (200 mg total) by mouth 3 (three) times daily., Disp: 6 tablet, Rfl: 0   promethazine (PHENERGAN) 25 MG tablet, TAKE ONE TABLET BY MOUTH DAILY AS NEEDED FOR NAUSEA, Disp: 30 tablet, Rfl: 0   rizatriptan (MAXALT) 5 MG tablet, 1 TAB AS NEEDED FOR MIGRAINE. MAY REPEAT IN 2 HRS IF NEEDED. MAX 2 TABS IN24HR OR 2-3 DOSES IN A WK., Disp: 10 tablet, Rfl: 5   TECFIDERA 240 MG CPDR, TAKE 1 CAPSULE BY MOUTH  TWICE DAILY, Disp: 60 capsule, Rfl: 11   valACYclovir (VALTREX) 1000 MG tablet, TAKE 2 TABLETS EVERY 12 HOURS X2 DOSES AS NEEDED FOR COLD SORES, Disp: 30 tablet, Rfl: 0   venlafaxine XR (EFFEXOR-XR) 75 MG 24 hr capsule, Take 1 capsule (75 mg total) by mouth daily with breakfast., Disp: 30 capsule, Rfl: 5   amitriptyline (ELAVIL) 25 MG tablet, Take 1-2 tablets (25-50 mg total) by mouth at bedtime., Disp: 180 tablet, Rfl: 3   ARIPiprazole (ABILIFY) 5 MG tablet, Take 1 tablet (5 mg total) by mouth daily., Disp: 90 tablet, Rfl: 4   baclofen (LIORESAL) 10 MG tablet, Take 1 tablet (10 mg total) by mouth 2 (two) times daily., Disp: 180  tablet, Rfl: 0   methylphenidate (RITALIN) 20 MG tablet, Take 1 tablet (20 mg total) by mouth 2 (two) times daily with breakfast and lunch., Disp: 60 tablet, Rfl: 0  PAST MEDICAL HISTORY: Past Medical History:  Diagnosis Date   Anal fissure    Anxiety    Arthritis    Borderline diabetes    Cancer ([place])    left breast cancer   Depression    Frequency of urination    H/O cold sores    History of pneumothorax    1988-- SPONTANEOUS--  RESOLVED W/ CHEST TUBE   History of radiation therapy 07/29/16- 08/27/16   Left Breast 40.05 Gy in 15 fractions, Left Breast Boost 10 Gy in 5 fractions.    IC (interstitial cystitis)    Internal hemorrhoids    Lesion of bladder    Migraines    MS (multiple sclerosis) ([place])    MRI showed plague on her brain   Nocturia    Personal history of radiation therapy    Preeclampsia 1994   RSD (reflex sympathetic dystrophy)    Seasonal allergies    Tubular adenoma of colon    Urgency of urination     PAST SURGICAL HISTORY: Past Surgical History:  Procedure Laterality Date   ANTERIOR CERVICAL DECOMPRESSION/DISCECTOMY FUSION 4 LEVELS N/A 07/03/2014   Procedure: ANTERIOR CERVICAL DECOMPRESSION/DISCECTOMY FUSION 4 LEVELS;  Surgeon: Newman Pies, MD;  Location: [facility];  Service: Neurosurgery;  Laterality: N/A;  C3-4 C4-5 C5-6 C6-7 Anterior cervical decompression/diskectomy/fusion/interbody prosthesis/plate   APPENDECTOMY     CESAREAN SECTION  1994   CYSTOSCOPY WITH HYDRODISTENSION AND BIOPSY Bilateral 02/15/2014   Procedure: CYSTOSCOPY  BILATERAL RETROGRADE PYLOGRAM, HYDRODISTENSION, INSTILATION OF MARCAINE AND PYRIDIUM;  Surgeon: Ardis Hughs, MD;  Location: [facility];  Service: Urology;  Laterality: Bilateral;   D & C HYSTEROSCOPY WITH POLYPECTOMY  12-02-2003   DILATION AND CURETTAGE OF UTERUS     DX LAPAROSCOPY/  FULGERATION ENDOMETRIOSIS/  APPENDECTOMY  1985   RADIOACTIVE SEED GUIDED PARTIAL MASTECTOMY WITH AXILLARY SENTINEL  LYMPH NODE BIOPSY Left 06/07/2016   Procedure: BREAST LUMPECTOMY WITH RADIOACTIVE SEED AND SENTINEL LYMPH NODE BIOPSY, INJECT BLUE DYE LEFT BREAST;  Surgeon: Fanny Skates, MD;  Location: [place];  Service: General;  Laterality: Left;   TONSILLECTOMY AND ADENOIDECTOMY  1972    FAMILY HISTORY: Family History  Problem Relation Age of Onset   Hypertension Mother    Diabetes Father    Prostate cancer Father        dx late 76s   Lung cancer Father 54       smoker   Cancer Father        cheek of mouth, dx. between 10 and 69   Diabetes Paternal Grandmother    Diabetes Paternal Grandfather    Aneurysm Maternal Grandmother 79       d. brain aneurysm   Prostate cancer Maternal Grandfather    Diabetes Paternal Uncle        x 4   Diabetes Paternal Aunt    Colon cancer Maternal Uncle    Cancer Maternal Uncle        mouth cancer; +tobacco   Breast cancer Sister 62       s/p mastectomy   Breast cancer Maternal Aunt        dx 80s   Lymphoma Maternal Aunt 83   Uterine cancer Maternal Aunt        d. late 80s   Throat cancer Cousin        maternal 1st cousin; lim info   Cancer Paternal Uncle        NOS cancer   Breast cancer Cousin        paternal 1st cousin dx 53s   Esophageal cancer Neg Hx    Rectal cancer Neg Hx    Stomach cancer Neg Hx     SOCIAL HISTORY:  Social History   Socioeconomic History   Marital status: Single    Spouse name: Not on file   Number of children: 1   Years of education: Not on file   Highest education level: Not on file  Occupational History   Occupation: disabled  Tobacco Use   Smoking status: Former    Packs/day: 0.25    Years: 27.00    Pack years: 6.75    Types: Cigarettes    Quit date: 01/17/2011    Years since quitting: 10.3   Smokeless tobacco: Never  Vaping Use   Vaping Use: Never used  Substance and Sexual Activity   Alcohol use: No   Drug use: No   Sexual activity: Never  Other Topics Concern   Not on file   Social History Narrative   Not on file   Social Determinants of Health   Financial Resource Strain: Not on file  Food Insecurity: Not on file  Transportation Needs: Not on file  Physical Activity: Not on file  Stress: Not on file  Social Connections: Not on file  Intimate Partner Violence: Not on file     PHYSICAL EXAM  Vitals:   05/13/21 1529  BP: 106/68  Pulse: 93  Weight: 114 lb (51.7 kg)  Height: '5\' 10"'$  (1.778 m)    Body mass index is 16.36 kg/m.   General: The patient is a thin woman in no acute distress   Neurologic Exam  Mental status: The patient is alert and oriented x 3 at the time of the examination. The  patient has apparent normal recent and remote memory, with an apparently normal attention span and concentration ability.   Speech is normal.  Cranial nerves:  Marland Kitchen   Facial strength and sensation is normal.  Trapezius and sternocleidomastoid strength is normal. No dysarthria is noted.  The tongue is midline, and the patient has symmetric elevation of the soft palate. No obvious hearing deficits are noted.  Motor:  Muscle bulk is normal.   Muscle tone is normal.  She has 4+/5 strength in the right triceps and the intrinsic hand muscles on the right.  Strength was normal in the legs.  Sensory: She has decreased sensation in the right arm.   '  Gait and station: Station is normal.   The gait is minimally wide.  Tandem gait is wide.  Romberg is negative  Reflexes: Deep tendon reflexes are increased in both legs with spread at the knees. There is no ankle clonus.Marland Kitchen       DIAGNOSTIC DATA (LABS, IMAGING, TESTING) - I reviewed patient records, labs, notes, testing and imaging myself where available.  Lab Results  Component Value Date   WBC 9.2 03/02/2021   HGB 11.7 (L) 03/02/2021   HCT 33.3 (L) 03/02/2021   MCV 86.3 03/02/2021   PLT 242 03/02/2021      Component Value Date/Time   NA 134 (L) 03/02/2021 2030   NA 139 02/01/2019 1047   K 3.8 03/02/2021  2030   CL 100 03/02/2021 2030   CO2 27 03/02/2021 2030   GLUCOSE 132 (H) 03/02/2021 2030   BUN 11 03/02/2021 2030   BUN 9 02/01/2019 1047   CREATININE 0.54 03/02/2021 2030   CREATININE 0.55 12/26/2014 1042   CALCIUM 9.5 03/02/2021 2030   PROT 6.9 07/21/2020 1118   ALBUMIN 4.7 07/21/2020 1118   AST 14 07/21/2020 1118   ALT 13 07/21/2020 1118   ALKPHOS 92 07/21/2020 1118   BILITOT <0.2 07/21/2020 1118   GFRNONAA >60 03/02/2021 2030   GFRNONAA >89 12/26/2014 1042   GFRAA 114 02/01/2019 1047   GFRAA >89 12/26/2014 1042     __________________________________________________     ASSESSMENT AND PLAN    1. Multiple sclerosis ([place])   2. High risk medication use   3. Hemifacial spasm of right side of face   4. Chronic migraine w/o aura, not intractable, w/o stat migr   5. Gait disturbance   6. Depression with anxiety   7. Malignant neoplasm of upper-outer quadrant of left breast in female, estrogen receptor positive ([place])   8. Dysesthesia       1.   We will recontact her insurance company to see if she can get authorized for Botox for her hemifacial spasms and chronic migraine.   2.   I believe the episode she had last month was due to a urinary tract infection with a temporary worsening rather than an exacerbation.  ContinueTecfidera 240 mg twice a day for MS.     recent blood work shows low normal lymphocyte count. 3.    Continue Ritalin 20 mg 2 daily hypersomnia and MS fatigue.  Continue Valium for spasticity and anxiety 4.   Continue Effexor and Abilify 5 mg daily.  She is now seeing psychiatry and med's may change to try to increase appetite 5.   `She will return i sooner we can get Botox scheduled or in 6 months for regular scheduled visit otherwise Shaila Gilchrest A. Felecia Shelling, MD, PhD [id number], [id number] PM Certified in Neurology, Clinical Neurophysiology, Sleep Medicine, Pain Medicine  and Neuroimaging  [facility] [address], [place], [place]  [address] [phone]

## 2021-05-14 ENCOUNTER — Telehealth: Payer: Self-pay | Admitting: Neurology

## 2021-05-14 NOTE — Telephone Encounter (Signed)
Dr. Felecia Shelling requested patient be called to schedule a Botox appointment. She is overdue d/t a previous balance. I checked with billing and she has made a payment since her last injection. I called patient and scheduled her for first available of 10/4. Patient understands I will call her if we have a cancellation.

## 2021-06-23 ENCOUNTER — Ambulatory Visit: Payer: Medicare Other | Admitting: Neurology

## 2021-07-01 ENCOUNTER — Other Ambulatory Visit: Payer: Self-pay | Admitting: Neurology

## 2021-07-01 DIAGNOSIS — G35 Multiple sclerosis: Secondary | ICD-10-CM

## 2021-07-15 NOTE — Telephone Encounter (Signed)
Patient has a Botox appointment 11/1 (r/s from 10/4). As of 07/21/21, [facility] Medicare will require PA of Botox. I called [facility] Medicare @ [phone] and spoke with Edd Arbour, he states PA is not required for CPT 64615, only J0585. Reference #[id number]. I submitted PA request via [facility] portal for 155 units of Botox for G43.709, every 12 weeks. Request was approved, PA #[id number] (07/15/21- 07/15/22). PA phone: [phone].

## 2021-07-21 ENCOUNTER — Ambulatory Visit (INDEPENDENT_AMBULATORY_CARE_PROVIDER_SITE_OTHER): Payer: Medicare Other | Admitting: Neurology

## 2021-07-21 VITALS — BP 110/70 | HR 95 | Ht 71.0 in | Wt 116.0 lb

## 2021-07-21 DIAGNOSIS — C50412 Malignant neoplasm of upper-outer quadrant of left female breast: Secondary | ICD-10-CM

## 2021-07-21 DIAGNOSIS — G5131 Clonic hemifacial spasm, right: Secondary | ICD-10-CM | POA: Diagnosis not present

## 2021-07-21 DIAGNOSIS — G35 Multiple sclerosis: Secondary | ICD-10-CM

## 2021-07-21 DIAGNOSIS — Z170 Estrogen receptor positive status [ER+]: Secondary | ICD-10-CM | POA: Diagnosis not present

## 2021-07-21 DIAGNOSIS — G43709 Chronic migraine without aura, not intractable, without status migrainosus: Secondary | ICD-10-CM

## 2021-07-21 DIAGNOSIS — Z79899 Other long term (current) drug therapy: Secondary | ICD-10-CM | POA: Diagnosis not present

## 2021-07-21 DIAGNOSIS — R269 Unspecified abnormalities of gait and mobility: Secondary | ICD-10-CM

## 2021-07-21 DIAGNOSIS — R208 Other disturbances of skin sensation: Secondary | ICD-10-CM | POA: Diagnosis not present

## 2021-07-21 MED ORDER — METHYLPHENIDATE HCL 20 MG PO TABS: 20.0000 mg | ORAL_TABLET | Freq: Two times a day (BID) | ORAL | 0 refills | Status: DC

## 2021-07-21 NOTE — Progress Notes (Signed)
[facility]  PATIENT: Stacy Logan DOB: 1965-10-06  REFERRING DOCTOR OR PCP:  Annye Asa SOURCE: patient, EMR records from [facility].  MRI images on PACS  _________________________________   HISTORICAL  CHIEF COMPLAINT:  Chief Complaint  Patient presents with   Botulinum Toxin Injection    Botox-migraines. 200U vial (B/B)    HISTORY OF PRESENT ILLNESS:  Stacy Logan is a 55 y.o. 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   She also has migraine headaches    Update 07/21/20 Headaches have been well controlled on Botox but she has been off for the past 9 months due to insurance issues.  She is currently getting migraines more than 20 days a month and they last more than 4 hours a day.   Facial twitching from the right hemifacial spasms also improved with Botox.  She has noted some more tics last couple months   MS is stable on Tecfidera and she tolerates to well.  She has not had an exacerbation in many years.  Lymphocyte counts were 0.8 01/15/2020.  She is noting more numbness and weakness in both hands.  Bladder function is fine,  Her mood has done well on Abilify, Effexor and diazepam.    She sleeps well most nights.   Ritalin has helped her focus/attention and fatigue  She gets back pain.  She had breast cancer that was treated with surgery, radiation and chemotherapy.  She remains in remission.   MS/spine history:   In 2001, she was noted to have left hearing loss and had an MRI of the brain.   She did not have numbness or weakness a that time.  She was told she likely had MS.   She saw a neurologist.  She never had an LP.   She reports she was told she had MS but was never started on any medication.   Starting about 5-6 years ago, she was noting more difficulty with gait and also noted some memory issues.   She was also having bladder issues with urgency and bladder incontinence x 5  years.      However, she had no health insurance at that time and did not follow up with neurology.   She saw Dr. Tomi Likens last year.   He ordered an MRI of the brian and cervical spine.   She had one enhancing lesion on the spine and one additional lesion.  An LP was ordered but she opted not to do the test.   She was started on tecfidera.   She has had some itching and flushing, especially if she does not take after food.     She also was found to have severe spinal stenosis and myelopathy.   She was sent to Dr. Arnoldo Morale of Neurosurgery.   She underwent  ACDF from C3-C7 06/2014.    She reports she is having more trouble with her arms since the surgery.   The worse pain is in the 2nd and 3rd fingers but she has altered sensation with some pain in the other fingers as well.   She also has right > left lower neck pain.  Bladder is about the same (maybe slightly better) as it was before surgery.       Breast Ca:   She was diagnosed with breast cancer and had surgery, RadRx and chemo.  She remains in remission.  MRI images:  MRI of the brain dated 01/12/2015 and the MRIs of  the cervical spine dated 02/01/2015 and 05/22/2014 were reviewed.   The MRI of the brain shows white matter foci predominantly in the deep white matter but also in the periventricular and subcortical white matter of both hemispheres. The brainstem appears normal. There is no significant atrophy. The MRI of the cervical spine from 2015 showed severe spinal stenosis at C5-C6 and milder spinal stenosis at C6-C7 and C4-C5. There is an enhancing focus within the spinal cord adjacent to C5-C6 and she has another hyperintense focus at C3-C4. On the second MRI, done without contrast, she is status post C3-C7 ACDF.     05/13/2018 MRI of the brain and cervical spine: The MRI of the brain shows multiple T2/FLAIR hyperintense foci in the hemispheres and in the pons.  The overall extent is similar to what was noted on the 01/12/2015 MRI.  Although some foci  are consistent with multiple sclerosis, others would be more likely due to chronic microvascular ischemic changes.  None of the lesions appear to be acute.  MRI of the cervical spine shows T2 hyperintense foci within the spinal cord centrally adjacent to C3 and adjacent to C4-C5.  There are bilateral hyperintense foci, right greater than left and possibly within the anterior horns, adjacent to C6.  The C3 and C4-C5 foci are nonspecific and could be due to demyelination but could also be the sequela of compressive myelopathy.  The changes at C5-C6 are more consistent with the sequela of compressive myelopathy.   REVIEW OF SYSTEMS: Constitutional: No fevers, chills, sweats, or change in appetite Eyes: as above Ear, nose and throat: No hearing loss, ear pain, nasal congestion, sore throat Cardiovascular: No chest pain, palpitations Respiratory:  No shortness of breath at rest or with exertion.   No wheezes GastrointestinaI: No nausea, vomiting, diarrhea, abdominal pain, fecal incontinence Genitourinary:  see above Musculoskeletal:  Some neck pain, back pain Integumentary: No rash, pruritus, skin lesions Neurological: as above Psychiatric: Notes depression and anxiety Endocrine: No palpitations, diaphoresis, change in appetite, change in weigh or increased thirst Hematologic/Lymphatic:  No anemia, purpura, petechiae. Allergic/Immunologic: No itchy/runny eyes, nasal congestion, recent allergic reactions, rashes  ALLERGIES: Allergies  Allergen Reactions   Codeine     Makes her "busy", itchy   Lamictal [Lamotrigine] Other (See Comments)    Mood changes    Meperidine Nausea And Vomiting   Meperidine Hcl Nausea And Vomiting    SEVERE N/V   Morphine Other (See Comments)    "SKIN CRAWLS"    HOME MEDICATIONS:  Current Outpatient Medications:    amitriptyline (ELAVIL) 25 MG tablet, Take 1-2 tablets (25-50 mg total) by mouth at bedtime., Disp: 180 tablet, Rfl: 3   ARIPiprazole (ABILIFY) 5 MG  tablet, Take 1 tablet (5 mg total) by mouth daily., Disp: 90 tablet, Rfl: 4   baclofen (LIORESAL) 10 MG tablet, Take 1 tablet (10 mg total) by mouth 2 (two) times daily., Disp: 180 tablet, Rfl: 0   BOTOX 100 units SOLR injection, INJECT 100 UNITS  INTRAMUSCULARLY EVERY 3  MONTHS (GIVEN AT  PRESCRIBERS OFFICE, DISCARD UNUSED), Disp: 100 Units, Rfl: 1   diazepam (VALIUM) 5 MG tablet, TAKE 1 TABLET BY MOUTH EVERY 8 HOURS AS NEEDED FOR SPASMS, Disp: 90 tablet, Rfl: 0   gabapentin (NEURONTIN) 300 MG capsule, TAKE 1 CAPSULE (300 MG TOTAL) BY MOUTH 4 (FOUR) TIMES DAILY., Disp: 360 capsule, Rfl: 3   gabapentin (NEURONTIN) 300 MG capsule, Take 1 capsule (300 mg total) by mouth 4 (four) times daily., Disp: 360  capsule, Rfl: 0   letrozole (FEMARA) 2.5 MG tablet, TAKE 1 TABLET BY MOUTH EVERY DAY, Disp: 90 tablet, Rfl: 3   OnabotulinumtoxinA (BOTOX IM), Inject into the muscle. Facial to treat MS, Disp: , Rfl:    pantoprazole (PROTONIX) 40 MG tablet, Take 1 tablet (40 mg total) by mouth daily., Disp: 90 tablet, Rfl: 1   phenazopyridine (PYRIDIUM) 200 MG tablet, Take 1 tablet (200 mg total) by mouth 3 (three) times daily., Disp: 6 tablet, Rfl: 0   promethazine (PHENERGAN) 25 MG tablet, TAKE ONE TABLET BY MOUTH DAILY AS NEEDED FOR NAUSEA, Disp: 30 tablet, Rfl: 0   rizatriptan (MAXALT) 5 MG tablet, 1 TAB AS NEEDED FOR MIGRAINE. MAY REPEAT IN 2 HRS IF NEEDED. MAX 2 TABS IN24HR OR 2-3 DOSES IN A WK., Disp: 10 tablet, Rfl: 5   TECFIDERA 240 MG CPDR, TAKE 1 CAPSULE (240MG ) BY  MOUTH TWICE DAILY, Disp: 60 capsule, Rfl: 11   valACYclovir (VALTREX) 1000 MG tablet, TAKE 2 TABLETS EVERY 12 HOURS X2 DOSES AS NEEDED FOR COLD SORES, Disp: 30 tablet, Rfl: 0   venlafaxine XR (EFFEXOR-XR) 75 MG 24 hr capsule, Take 1 capsule (75 mg total) by mouth daily with breakfast., Disp: 30 capsule, Rfl: 5   methylphenidate (RITALIN) 20 MG tablet, Take 1 tablet (20 mg total) by mouth 2 (two) times daily with breakfast and lunch., Disp: 60  tablet, Rfl: 0  PAST MEDICAL HISTORY: Past Medical History:  Diagnosis Date   Anal fissure    Anxiety    Arthritis    Borderline diabetes    Cancer (HCC)    left breast cancer   Depression    Frequency of urination    H/O cold sores    History of pneumothorax    1988-- SPONTANEOUS--  RESOLVED W/ CHEST TUBE   History of radiation therapy 07/29/16- 08/27/16   Left Breast 40.05 Gy in 15 fractions, Left Breast Boost 10 Gy in 5 fractions.    IC (interstitial cystitis)    Internal hemorrhoids    Lesion of bladder    Migraines    MS (multiple sclerosis) ([place])    MRI showed plague on her brain   Nocturia    Personal history of radiation therapy    Preeclampsia 1994   RSD (reflex sympathetic dystrophy)    Seasonal allergies    Tubular adenoma of colon    Urgency of urination     PAST SURGICAL HISTORY: Past Surgical History:  Procedure Laterality Date   ANTERIOR CERVICAL DECOMPRESSION/DISCECTOMY FUSION 4 LEVELS N/A 07/03/2014   Procedure: ANTERIOR CERVICAL DECOMPRESSION/DISCECTOMY FUSION 4 LEVELS;  Surgeon: Newman Pies, MD;  Location: [facility];  Service: Neurosurgery;  Laterality: N/A;  C3-4 C4-5 C5-6 C6-7 Anterior cervical decompression/diskectomy/fusion/interbody prosthesis/plate   APPENDECTOMY     CESAREAN SECTION  1994   CYSTOSCOPY WITH HYDRODISTENSION AND BIOPSY Bilateral 02/15/2014   Procedure: CYSTOSCOPY  BILATERAL RETROGRADE PYLOGRAM, HYDRODISTENSION, INSTILATION OF MARCAINE AND PYRIDIUM;  Surgeon: Ardis Hughs, MD;  Location: [facility];  Service: Urology;  Laterality: Bilateral;   D & C HYSTEROSCOPY WITH POLYPECTOMY  12-02-2003   DILATION AND CURETTAGE OF UTERUS     DX LAPAROSCOPY/  FULGERATION ENDOMETRIOSIS/  APPENDECTOMY  1985   RADIOACTIVE SEED GUIDED PARTIAL MASTECTOMY WITH AXILLARY SENTINEL LYMPH NODE BIOPSY Left 06/07/2016   Procedure: BREAST LUMPECTOMY WITH RADIOACTIVE SEED AND SENTINEL LYMPH NODE BIOPSY, INJECT BLUE DYE LEFT BREAST;   Surgeon: Fanny Skates, MD;  Location: [facility];  Service: General;  Laterality: Left;   TONSILLECTOMY AND ADENOIDECTOMY  1972    FAMILY HISTORY: Family History  Problem Relation Age of Onset   Hypertension Mother    Diabetes Father    Prostate cancer Father        dx late 35s   Lung cancer Father 35       smoker   Cancer Father        cheek of mouth, dx. between 53 and 77   Diabetes Paternal Grandmother    Diabetes Paternal Grandfather    Aneurysm Maternal Grandmother 61       d. brain aneurysm   Prostate cancer Maternal Grandfather    Diabetes Paternal Uncle        x 4   Diabetes Paternal Aunt    Colon cancer Maternal Uncle    Cancer Maternal Uncle        mouth cancer; +tobacco   Breast cancer Sister 56       s/p mastectomy   Breast cancer Maternal Aunt        dx 87s   Lymphoma Maternal Aunt 83   Uterine cancer Maternal Aunt        d. late 80s   Throat cancer Cousin        maternal 1st cousin; lim info   Cancer Paternal Uncle        NOS cancer   Breast cancer Cousin        paternal 1st cousin dx 67s   Esophageal cancer Neg Hx    Rectal cancer Neg Hx    Stomach cancer Neg Hx     SOCIAL HISTORY:  Social History   Socioeconomic History   Marital status: Single    Spouse name: Not on file   Number of children: 1   Years of education: Not on file   Highest education level: Not on file  Occupational History   Occupation: disabled  Tobacco Use   Smoking status: Former    Packs/day: 0.25    Years: 27.00    Pack years: 6.75    Types: Cigarettes    Quit date: 01/17/2011    Years since quitting: 10.5   Smokeless tobacco: Never  Vaping Use   Vaping Use: Never used  Substance and Sexual Activity   Alcohol use: No   Drug use: No   Sexual activity: Never  Other Topics Concern   Not on file  Social History Narrative   Not on file   Social Determinants of Health   Financial Resource Strain: Not on file  Food Insecurity: Not on file   Transportation Needs: Not on file  Physical Activity: Not on file  Stress: Not on file  Social Connections: Not on file  Intimate Partner Violence: Not on file     PHYSICAL EXAM  Vitals:   07/21/21 0833  BP: 110/70  Pulse: 95  SpO2: 98%  Weight: 116 lb (52.6 kg)  Height: 5\' 11"  (1.803 m)    Body mass index is 16.18 kg/m.   General: The patient is a thin woman in no acute distress   Neurologic Exam  Mental status: The patient is alert and oriented x 3 at the time of the examination. The patient has apparent normal recent and remote memory, with an apparently normal attention span and concentration ability.   Speech is normal.  Cranial nerves:  Marland Kitchen   Facial strength and sensation is normal.  No eyelid drooping.  Trapezius and sternocleidomastoid strength is normal. No dysarthria is  noted.  Hearing seems symmetric. Motor:  Muscle bulk is normal.   Muscle tone is normal.  She has 4+/5 strength in the right triceps and the intrinsic hand muscles on the right.  Strength was normal in the legs.  Sensory: She has decreased sensation in the right arm.  Normal sensation elsewhere. '  Gait and station: Station is normal.   Gait is fairly normal but the tandem gait is wide.  Romberg is negative  Reflexes: Deep tendon reflexes are increased in both legs with spread at the knees. There is no ankle clonus.Marland Kitchen       DIAGNOSTIC DATA (LABS, IMAGING, TESTING) - I reviewed patient records, labs, notes, testing and imaging myself where available.  Lab Results  Component Value Date   WBC 9.2 03/02/2021   HGB 11.7 (L) 03/02/2021   HCT 33.3 (L) 03/02/2021   MCV 86.3 03/02/2021   PLT 242 03/02/2021      Component Value Date/Time   NA 134 (L) 03/02/2021 2030   NA 139 02/01/2019 1047   K 3.8 03/02/2021 2030   CL 100 03/02/2021 2030   CO2 27 03/02/2021 2030   GLUCOSE 132 (H) 03/02/2021 2030   BUN 11 03/02/2021 2030   BUN 9 02/01/2019 1047   CREATININE 0.54 03/02/2021 2030   CREATININE  0.55 12/26/2014 1042   CALCIUM 9.5 03/02/2021 2030   PROT 6.9 07/21/2020 1118   ALBUMIN 4.7 07/21/2020 1118   AST 14 07/21/2020 1118   ALT 13 07/21/2020 1118   ALKPHOS 92 07/21/2020 1118   BILITOT <0.2 07/21/2020 1118   GFRNONAA >60 03/02/2021 2030   GFRNONAA >89 12/26/2014 1042   GFRAA 114 02/01/2019 1047   GFRAA >89 12/26/2014 1042     __________________________________________________  BOTOX INJECTION  The risks and benefits of Botox injection brain to Mrs. Zaucha. We discussed the possibility of ptosis as the most common side effect with injection for blepharospasm and hemifacial spasm.   The following injections were performed using sterile technique.  Right frontalis (3 units 2) Left frontalis (3 units 2) Procerus and corrugators (3 units 3) Lateral canthus (2.5 units x 2)                  Right zygomaticus (10 units) Right Buccinator (5 units)  Left Buccinator  (5 units )  Temporalis/Occipitalis  (3 U x 10) 76 units injected into the muscles of the face/scalp  Splenius capitis (12.5 units 2) C3 paraspinal (12.5 units 2) C7 paraspinal (10 units 2)    Trapezius (15 U x 2) 100 U injected into the muscles of the neck   Total 176 units were injected and 24 wasted    ASSESSMENT AND PLAN    1. Multiple sclerosis ([place])   2. High risk medication use   3. Chronic migraine w/o aura, not intractable, w/o stat migr   4. Hemifacial spasm of right side of face   5. Gait disturbance   6. Dysesthesia   7. Malignant neoplasm of upper-outer quadrant of left breast in female, estrogen receptor positive ([place])       1.   Botox (176 units) was injected into the muscles of the face and neck as described above for hemifacial spasm and chronic migraine.  She tolerated injections well there are no complications.   2.   ContinueTecfidera 240 mg twice a day for MS.    we need to check a CBC with differential today.   3.    For MS related ADD  and hypersomnia, she will continue  Ritalin 20 mg 2 daily.  Continue Valium for spasticity and anxiety 4.   Mood is better with Effexor and Abilify 5 mg daily.   5.   `She will return in 3 months or sooner if there are new or worsening neurologic symptoms.  Velina Drollinger A. Felecia Shelling, MD, PhD 37/0/9643, 8:38 AM Certified in Neurology, Clinical Neurophysiology, Sleep Medicine, Pain Medicine and Neuroimaging  [facility] [address] [place], [place] [address] [phone]

## 2021-07-22 ENCOUNTER — Other Ambulatory Visit: Payer: Self-pay | Admitting: Neurology

## 2021-07-22 LAB — CBC WITH DIFFERENTIAL/PLATELET
Basophils Absolute: 0 10*3/uL (ref 0.0–0.2)
Basos: 1 %
EOS (ABSOLUTE): 0 10*3/uL (ref 0.0–0.4)
Eos: 0 %
Hematocrit: 37.3 % (ref 34.0–46.6)
Hemoglobin: 12.4 g/dL (ref 11.1–15.9)
Immature Grans (Abs): 0 10*3/uL (ref 0.0–0.1)
Immature Granulocytes: 0 %
Lymphocytes Absolute: 0.5 10*3/uL — ABNORMAL LOW (ref 0.7–3.1)
Lymphs: 13 %
MCH: 29.5 pg (ref 26.6–33.0)
MCHC: 33.2 g/dL (ref 31.5–35.7)
MCV: 89 fL (ref 79–97)
Monocytes Absolute: 0.4 10*3/uL (ref 0.1–0.9)
Monocytes: 11 %
Neutrophils Absolute: 2.9 10*3/uL (ref 1.4–7.0)
Neutrophils: 75 %
Platelets: 167 10*3/uL (ref 150–450)
RBC: 4.2 x10E6/uL (ref 3.77–5.28)
RDW: 14.3 % (ref 11.7–15.4)
WBC: 3.9 10*3/uL (ref 3.4–10.8)

## 2021-08-07 ENCOUNTER — Other Ambulatory Visit: Payer: Self-pay | Admitting: Hematology and Oncology

## 2021-08-17 ENCOUNTER — Inpatient Hospital Stay: Payer: Medicare Other | Attending: Hematology and Oncology | Admitting: Adult Health

## 2021-08-17 ENCOUNTER — Encounter: Payer: Self-pay | Admitting: Adult Health

## 2021-08-17 ENCOUNTER — Inpatient Hospital Stay: Payer: Medicare Other

## 2021-08-17 ENCOUNTER — Other Ambulatory Visit: Payer: Self-pay

## 2021-08-17 VITALS — BP 123/81 | HR 93 | Temp 97.9°F | Resp 18 | Ht 71.0 in | Wt 119.6 lb

## 2021-08-17 DIAGNOSIS — Z79899 Other long term (current) drug therapy: Secondary | ICD-10-CM | POA: Insufficient documentation

## 2021-08-17 DIAGNOSIS — E2839 Other primary ovarian failure: Secondary | ICD-10-CM

## 2021-08-17 DIAGNOSIS — C50412 Malignant neoplasm of upper-outer quadrant of left female breast: Secondary | ICD-10-CM | POA: Diagnosis not present

## 2021-08-17 DIAGNOSIS — Z170 Estrogen receptor positive status [ER+]: Secondary | ICD-10-CM | POA: Diagnosis not present

## 2021-08-17 DIAGNOSIS — Z79811 Long term (current) use of aromatase inhibitors: Secondary | ICD-10-CM | POA: Diagnosis not present

## 2021-08-17 LAB — COMPREHENSIVE METABOLIC PANEL
ALT: 26 U/L (ref 0–44)
AST: 24 U/L (ref 15–41)
Albumin: 4.2 g/dL (ref 3.5–5.0)
Alkaline Phosphatase: 96 U/L (ref 38–126)
Anion gap: 9 (ref 5–15)
BUN: 15 mg/dL (ref 6–20)
CO2: 27 mmol/L (ref 22–32)
Calcium: 9.7 mg/dL (ref 8.9–10.3)
Chloride: 103 mmol/L (ref 98–111)
Creatinine, Ser: 0.69 mg/dL (ref 0.44–1.00)
GFR, Estimated: >60 mL/min (ref >60)
Glucose, Bld: 99 mg/dL (ref 70–99)
Potassium: 4.3 mmol/L (ref 3.5–5.1)
Sodium: 139 mmol/L (ref 135–145)
Total Bilirubin: 0.3 mg/dL (ref 0.3–1.2)
Total Protein: 7.3 g/dL (ref 6.5–8.1)

## 2021-08-17 LAB — CBC WITH DIFFERENTIAL (CANCER CENTER ONLY)
Abs Immature Granulocytes: 0.01 10*3/uL (ref 0.00–0.07)
Basophils Absolute: 0 10*3/uL (ref 0.0–0.1)
Basophils Relative: 1 %
Eosinophils Absolute: 0.1 10*3/uL (ref 0.0–0.5)
Eosinophils Relative: 1 %
HCT: 35.9 % — ABNORMAL LOW (ref 36.0–46.0)
Hemoglobin: 11.8 g/dL — ABNORMAL LOW (ref 12.0–15.0)
Immature Granulocytes: 0 %
Lymphocytes Relative: 16 %
Lymphs Abs: 0.7 10*3/uL (ref 0.7–4.0)
MCH: 29.3 pg (ref 26.0–34.0)
MCHC: 32.9 g/dL (ref 30.0–36.0)
MCV: 89.1 fL (ref 80.0–100.0)
Monocytes Absolute: 0.4 10*3/uL (ref 0.1–1.0)
Monocytes Relative: 8 %
Neutro Abs: 3.3 10*3/uL (ref 1.7–7.7)
Neutrophils Relative %: 74 %
Platelet Count: 232 10*3/uL (ref 150–400)
RBC: 4.03 MIL/uL (ref 3.87–5.11)
RDW: 14.7 % (ref 11.5–15.5)
WBC Count: 4.4 10*3/uL (ref 4.0–10.5)
nRBC: 0 % (ref 0.0–0.2)

## 2021-08-17 NOTE — Progress Notes (Signed)
[facility] Follow up:    Stacy Jeans, PA-C [address] [place] [address]   DIAGNOSIS:  Cancer Staging  Malignant neoplasm of upper-outer quadrant of left breast in female, estrogen receptor positive ([place]) Staging form: Breast, AJCC 7th Edition - Clinical: Stage IA (T1, N0, M0) - Unsigned - Pathologic: Stage IA (T1c, N0, cM0) - Unsigned   SUMMARY OF ONCOLOGIC HISTORY: Oncology History  Breast cancer of upper-outer quadrant of left female breast ([place]) (Resolved)  05/10/2016 Initial Diagnosis   Left breast biopsy UOQ: IDC with DCIS, grade 1-2, ER 100%, PR 10%, Ki-67 10%, HER-2 negative ratio 1.59   05/10/2016 Mammogram   Left breast: 1.2 cm irregular marginated mass 1:00 position 6 cm from the nipple. Numerous grouped calcifications throughout right breast: Stable compared to prior, Left breast: calcifications stable, T1 cN0 stage IA clinical stage   05/11/2016 Procedure   Genetic testing:negative for mutations within any of 33 genes on the Custom Panel (Breast, Gyn, GI genes plus FLCN due to her personal history of pneumothorax) through [facility]   06/07/2016 Surgery   Left lumpectomy: IDC grade 1, 1.1 cm, DCIS intermediate grade, margins negative, 0/2 lymph nodes negative, T1c N0 stage IA   Malignant neoplasm of upper-outer quadrant of left breast in female, estrogen receptor positive ([place])  05/10/2016 Mammogram   Left breast: 1.2 cm irregular marginated mass 1:00 position 6 cm from the nipple. Numerous grouped calcifications throughout right breast: Stable compared to prior, Left breast: calcifications stable, T1 cN0 stage IA clinical stage   05/10/2016 Initial Diagnosis   Left breast biopsy UOQ: IDC with DCIS, grade 1-2, ER 100%, PR 10%, Ki-67 10%, HER-2 negative ratio 1.59   05/11/2016 Miscellaneous   Genetic testing:negative for mutations within any of 33 genes on the Custom Panel (Breast, Gyn, GI genes plus FLCN due to  her personal history of pneumothorax) through [facility]   05/27/2016 Genetic Testing   Genetic testing was normal, and did not reveal a deleterious mutation in these genes.  Additionally, no variants of uncertain significance ([place]US[place]) were found. Genes tested include: APC, ATM, AXIN2, BARD1, BMPR1A, BRCA1, BRCA2, BRIP1, CDH1, CDK4, CDKN2A, CHEK2, EPCAM, FANCC, FLCN, MLH1, MSH2, MSH6, MUTYH, NBN, PALB2, PMS2, POLD1, POLE, PTEN, RAD51C, RAD51D, SCG5/GREM1, SMAD4, STK11, TP53, VHL, and XRCC2     06/07/2016 Surgery   Left lumpectomy: IDC grade 1, 1.1 cm, DCIS intermediate grade, margins negative, 0/2 lymph nodes negative, T1c N0 stage IA   06/19/2016 Oncotype testing   Oncotype DX score 22, intermediate risk, 14% ROR   07/29/2016 - 08/27/2016 Radiation Therapy   Adjuvant radiation therapy ([place]): 1. The Left breast was treated to 40.05 Gy in 15 fractions at 2.67 Gy per fraction. 2. The Left breast was boosted to 10 Gy in 5 fractions at 2 Gy per fraction.    09/20/2016 -  Anti-estrogen oral therapy   Letrozole 1 mg daily     CURRENT THERAPY: Letrozole  INTERVAL HISTORY: Stacy Logan 55 y.o. female returns for follow-up of her breast cancer.  She continues on letrozole daily.  She notes hair growth especially on her arms will since starting the letrozole.  She is very much looking forward to being able to come off of the letrozole.  Her most recent mammogram was completed on April 27, 2021.  It showed no evidence of malignancy.  She had breast density category C.  [place] has recently moved.  She is reporting a 4 week  h/o LLQ pain.  It is worsening in intensity.  It sometimes improves with her bowel movements.  She notes her stools have been looser since this time as well.  She denies any fever or chills.  She denies blood in her stool or black tarry stool.  She is going on a cruise next week and is nervous that something could happen while out to sea.    Patient Active Problem List    Diagnosis Date Noted   Chronic migraine w/o aura, not intractable, w/o stat migr 07/21/2020   High risk medication use 04/16/2020   Gait disturbance 02/23/2017   Displaced fracture of fifth metatarsal bone, left foot, initial encounter for open fracture 02/23/2017   Elbow pain, chronic, right 02/23/2017   History of foot fracture 02/23/2017   Malignant neoplasm of upper-outer quadrant of left breast in female, estrogen receptor positive ([place]) 07/21/2016   Genetic testing 06/27/2016   Blepharospasm 05/19/2016   Hemifacial spasm 05/19/2016   Numbness 11/04/2015   Ulnar neuritis 11/04/2015   Physical exam 05/15/2015   Dysesthesia 04/15/2015   Other fatigue 04/15/2015   Urge incontinence 04/15/2015   Insomnia 04/15/2015   Memory disturbance 04/15/2015   Bilateral arm weakness 03/28/2015   Interstitial cystitis 03/28/2015   Influenza with respiratory manifestation other than pneumonia 11/29/2014   Sore in nose 10/16/2014   Thrush 08/22/2014   Acne cystica 07/31/2014   Cervical spondylosis with myelopathy and radiculopathy 07/03/2014   Hearing loss in left ear 05/30/2014   Special screening for malignant neoplasms, colon 05/02/2014   Unspecified constipation 05/02/2014   Chronic migraine 04/23/2014   Loss of weight 04/11/2014   Multiple sclerosis ([place]) 04/11/2014   Diffuse pain 04/11/2014   Glucosuria 01/16/2014   Depression with anxiety 01/16/2014   Recurrent cold sores 01/16/2014   HYPOKALEMIA 10/02/2007   ANEMIA, IRON DEFICIENCY 10/02/2007   UTI 10/02/2007   URI 08/08/2007   CONTUSION OF UNSPECIFIED SITE 08/01/2007    is allergic to codeine, lamictal [lamotrigine], meperidine, meperidine hcl, and morphine.  MEDICAL HISTORY: Past Medical History:  Diagnosis Date   Anal fissure    Anxiety    Arthritis    Borderline diabetes    Cancer ([place])    left breast cancer   Depression    Frequency of urination    H/O cold sores    History of pneumothorax    1988--  SPONTANEOUS--  RESOLVED W/ CHEST TUBE   History of radiation therapy 07/29/16- 08/27/16   Left Breast 40.05 Gy in 15 fractions, Left Breast Boost 10 Gy in 5 fractions.    IC (interstitial cystitis)    Internal hemorrhoids    Lesion of bladder    Migraines    MS (multiple sclerosis) ([place])    MRI showed plague on her brain   Nocturia    Personal history of radiation therapy    Preeclampsia 1994   RSD (reflex sympathetic dystrophy)    Seasonal allergies    Tubular adenoma of colon    Urgency of urination     SURGICAL HISTORY: Past Surgical History:  Procedure Laterality Date   ANTERIOR CERVICAL DECOMPRESSION/DISCECTOMY FUSION 4 LEVELS N/A 07/03/2014   Procedure: ANTERIOR CERVICAL DECOMPRESSION/DISCECTOMY FUSION 4 LEVELS;  Surgeon: Newman Pies, MD;  Location: [facility];  Service: Neurosurgery;  Laterality: N/A;  C3-4 C4-5 C5-6 C6-7 Anterior cervical decompression/diskectomy/fusion/interbody prosthesis/plate   APPENDECTOMY     CESAREAN SECTION  1994   CYSTOSCOPY WITH HYDRODISTENSION AND BIOPSY Bilateral 02/15/2014   Procedure: CYSTOSCOPY  BILATERAL RETROGRADE PYLOGRAM, HYDRODISTENSION, INSTILATION OF MARCAINE AND PYRIDIUM;  Surgeon: Ardis Hughs, MD;  Location: [facility];  Service: Urology;  Laterality: Bilateral;   D & C HYSTEROSCOPY WITH POLYPECTOMY  12-02-2003   DILATION AND CURETTAGE OF UTERUS     DX LAPAROSCOPY/  FULGERATION ENDOMETRIOSIS/  APPENDECTOMY  1985   RADIOACTIVE SEED GUIDED PARTIAL MASTECTOMY WITH AXILLARY SENTINEL LYMPH NODE BIOPSY Left 06/07/2016   Procedure: BREAST LUMPECTOMY WITH RADIOACTIVE SEED AND SENTINEL LYMPH NODE BIOPSY, INJECT BLUE DYE LEFT BREAST;  Surgeon: Fanny Skates, MD;  Location: [facility];  Service: General;  Laterality: Left;   TONSILLECTOMY AND ADENOIDECTOMY  1972    SOCIAL HISTORY: Social History   Socioeconomic History   Marital status: Single    Spouse name: Not on file   Number of children: 1    Years of education: Not on file   Highest education level: Not on file  Occupational History   Occupation: disabled  Tobacco Use   Smoking status: Former    Packs/day: 0.25    Years: 27.00    Pack years: 6.75    Types: Cigarettes    Quit date: 01/17/2011    Years since quitting: 10.5   Smokeless tobacco: Never  Vaping Use   Vaping Use: Never used  Substance and Sexual Activity   Alcohol use: No   Drug use: No   Sexual activity: Never  Other Topics Concern   Not on file  Social History Narrative   Not on file   Social Determinants of Health   Financial Resource Strain: Not on file  Food Insecurity: Not on file  Transportation Needs: Not on file  Physical Activity: Not on file  Stress: Not on file  Social Connections: Not on file  Intimate Partner Violence: Not on file    FAMILY HISTORY: Family History  Problem Relation Age of Onset   Hypertension Mother    Diabetes Father    Prostate cancer Father        dx late 68s   Lung cancer Father 35       smoker   Cancer Father        cheek of mouth, dx. between 45 and 48   Diabetes Paternal Grandmother    Diabetes Paternal Grandfather    Aneurysm Maternal Grandmother 26       d. brain aneurysm   Prostate cancer Maternal Grandfather    Diabetes Paternal Uncle        x 4   Diabetes Paternal Aunt    Colon cancer Maternal Uncle    Cancer Maternal Uncle        mouth cancer; +tobacco   Breast cancer Sister 64       s/p mastectomy   Breast cancer Maternal Aunt        dx 80s   Lymphoma Maternal Aunt 83   Uterine cancer Maternal Aunt        d. late 80s   Throat cancer Cousin        maternal 1st cousin; lim info   Cancer Paternal Uncle        NOS cancer   Breast cancer Cousin        paternal 1st cousin dx 33s   Esophageal cancer Neg Hx    Rectal cancer Neg Hx    Stomach cancer Neg Hx     Review of Systems  Constitutional:  Negative for appetite change, chills, fatigue, fever and unexpected weight change.  HENT:  Negative for hearing loss, lump/mass and trouble swallowing.   Eyes:  Negative for eye problems and icterus.  Respiratory:  Negative for chest tightness, cough and shortness of breath.   Cardiovascular:  Negative for chest pain, leg swelling and palpitations.  Gastrointestinal:  Negative for abdominal distention, abdominal pain, constipation, diarrhea, nausea and vomiting.  Endocrine: Negative for hot flashes.  Genitourinary:  Negative for difficulty urinating.   Musculoskeletal:  Negative for arthralgias.  Skin:  Negative for itching and rash.  Neurological:  Negative for dizziness, extremity weakness, headaches and numbness.  Hematological:  Negative for adenopathy. Does not bruise/bleed easily.  Psychiatric/Behavioral:  Negative for depression. The patient is not nervous/anxious.      PHYSICAL EXAMINATION  ECOG PERFORMANCE STATUS: 1 - Symptomatic but completely ambulatory  Vitals:   08/17/21 1028  BP: 123/81  Pulse: 93  Resp: 18  Temp: 97.9 F (36.6 C)  SpO2: 100%    Physical Exam Constitutional:      General: She is not in acute distress.    Appearance: Normal appearance. She is not toxic-appearing.  HENT:     Head: Normocephalic and atraumatic.  Eyes:     General: No scleral icterus. Cardiovascular:     Rate and Rhythm: Normal rate and regular rhythm.     Pulses: Normal pulses.     Heart sounds: Normal heart sounds.  Pulmonary:     Effort: Pulmonary effort is normal.     Breath sounds: Normal breath sounds.  Chest:     Comments: Left breast status postlumpectomy and radiation no sign of local recurrence right breast is benign. Abdominal:     General: Abdomen is flat. Bowel sounds are normal. There is no distension.     Palpations: Abdomen is soft.     Tenderness: There is no abdominal tenderness.  Musculoskeletal:        General: No swelling.     Cervical back: Neck supple.  Lymphadenopathy:     Cervical: No cervical adenopathy.  Skin:    General: Skin is  warm and dry.     Findings: No rash.  Neurological:     General: No focal deficit present.     Mental Status: She is alert.  Psychiatric:        Mood and Affect: Mood normal.        Behavior: Behavior normal.    LABORATORY DATA:  CBC    Component Value Date/Time   WBC 4.4 08/17/2021 1114   WBC 9.2 03/02/2021 2030   RBC 4.03 08/17/2021 1114   HGB 11.8 (L) 08/17/2021 1114   HGB 12.4 07/21/2021 0904   HCT 35.9 (L) 08/17/2021 1114   HCT 37.3 07/21/2021 0904   PLT 232 08/17/2021 1114   PLT 167 07/21/2021 0904   MCV 89.1 08/17/2021 1114   MCV 89 07/21/2021 0904   MCH 29.3 08/17/2021 1114   MCHC 32.9 08/17/2021 1114   RDW 14.7 08/17/2021 1114   RDW 14.3 07/21/2021 0904   LYMPHSABS 0.7 08/17/2021 1114   LYMPHSABS 0.5 (L) 07/21/2021 0904   MONOABS 0.4 08/17/2021 1114   EOSABS 0.1 08/17/2021 1114   EOSABS 0.0 07/21/2021 0904   BASOSABS 0.0 08/17/2021 1114   BASOSABS 0.0 07/21/2021 0904    CMP     Component Value Date/Time   NA 134 (L) 03/02/2021 2030   NA 139 02/01/2019 1047   K 3.8 03/02/2021 2030   CL 100 03/02/2021 2030   CO2 27 03/02/2021 2030   GLUCOSE 132 (H) 03/02/2021  2030   BUN 11 03/02/2021 2030   BUN 9 02/01/2019 1047   CREATININE 0.54 03/02/2021 2030   CREATININE 0.55 12/26/2014 1042   CALCIUM 9.5 03/02/2021 2030   PROT 6.9 07/21/2020 1118   ALBUMIN 4.7 07/21/2020 1118   AST 14 07/21/2020 1118   ALT 13 07/21/2020 1118   ALKPHOS 92 07/21/2020 1118   BILITOT <0.2 07/21/2020 1118   GFRNONAA >60 03/02/2021 2030   GFRNONAA >89 12/26/2014 1042   GFRAA 114 02/01/2019 1047   GFRAA >89 12/26/2014 1042       PENDING LABS:   RADIOGRAPHIC STUDIES:  No results found.   PATHOLOGY:     ASSESSMENT and THERAPY PLAN:   Malignant neoplasm of upper-outer quadrant of left breast in female, estrogen receptor positive ([place]) Left lumpectomy 06/07/2016: IDC grade 1, 1.1 cm, DCIS intermediate grade, margins negative, 0/2 lymph nodes negative, T1c N0 stage  IA   Genetic testing: Negative Oncotype DX score 22, 14% risk of recurrence with tamoxifen alone, intermediate risk Adjuvant radiation therapy 07/29/2016 to 08/27/2016   Treatment: Adjuvant antiestrogen therapy with letrozole by mouth daily started January 2018   Anastrozole toxicities: The first 2 weeks patient had mood swings. Since then she has been doing quite well. Denies any hot flashes or myalgias.   Surveillance:  Her breast exam today shows no evidence of malignancy.  She also underwent mammography in August of this year that was negative for malignancy.  We discussed the letrozole that she is taking.  She can stop this at the end of this year.  She has a gynecologist that she sees regularly that follows her mammograms and does an annual breast exam.  She is comfortable with graduating from our care and can be seen on an as-needed basis moving forward.  We discussed stopping letrozole and that some women are eligible for a small amount of benefit in taking it for 7 years instead of 5 years. She notes that she is ready to stop the letrozole and is comfortable with this considering that she has received the main 5-year benefit from the therapy.  She is experiencing some left lower quadrant pain with some tenderness on exam.  On her colonoscopy that she underwent in 2018 diverticula were noted throughout the sigmoid colon.  Considering this was seen on her colonoscopy and her stool change I am concerned she may have a low-level diverticulitis.  We will get a CBC today to evaluate for elevated white count or left shift.  Should that be elevated I will send in some antibiotics if not I will defer to her primary care for management.  We will see K[place] on an as-needed basis.  She met with Dr. Payton Mccallum briefly to discuss the above and [facility] by as she is graduating from oncology follow-up.     All questions were answered. The patient knows to call the clinic with any problems, questions or  concerns. We can certainly see the patient much sooner if necessary.  Total encounter time: 30 minutes in face-to-face visit time, chart review, lab review, order entry, care coordination, and documentation of the encounter.  Wilber Bihari, NP 08/17/21 11:37 AM Medical Oncology and Hematology [facility] [place], [place] [address] Tel. [phone]    Fax. [phone]  *Total Encounter Time as defined by the Centers for Medicare and Medicaid Services includes, in addition to the face-to-face time of a patient visit (documented in the note above) non-face-to-face time: obtaining and reviewing outside history,  ordering and reviewing medications, tests or procedures, care coordination (communications with other health care professionals or caregivers) and documentation in the medical record.

## 2021-08-17 NOTE — Assessment & Plan Note (Addendum)
Left lumpectomy 06/07/2016: IDC grade 1, 1.1 cm, DCIS intermediate grade, margins negative, 0/2 lymph nodes negative, T1c N0 stage IA  Genetic testing: Negative Oncotype DX score 22, 14% risk of recurrence with tamoxifen alone, intermediate risk Adjuvant radiation therapy 07/29/2016 to 08/27/2016  Treatment: Adjuvant antiestrogen therapy with letrozole by mouth daily startedJanuary 2018  Anastrozole toxicities: The first 2 weeks patient had mood swings. Since then she has been doing quite well. Denies any hot flashes or myalgias.  Surveillance:  Her breast exam today shows no evidence of malignancy.  She also underwent mammography in August of this year that was negative for malignancy.  We discussed the letrozole that she is taking.  She can stop this at the end of this year.  She has a gynecologist that she sees regularly that follows her mammograms and does an annual breast exam.  She is comfortable with graduating from our care and can be seen on an as-needed basis moving forward.  We discussed stopping letrozole and that some women are eligible for a small amount of benefit in taking it for 7 years instead of 5 years. She notes that she is ready to stop the letrozole and is comfortable with this considering that she has received the main 5-year benefit from the therapy.  She is experiencing some left lower quadrant pain with some tenderness on exam.  On her colonoscopy that she underwent in 2018 diverticula were noted throughout the sigmoid colon.  Considering this was seen on her colonoscopy and her stool change I am concerned she may have a low-level diverticulitis.  We will get a CBC today to evaluate for elevated white count or left shift.  Should that be elevated I will send in some antibiotics if not I will defer to her primary care for management.  We will see [place] on an as-needed basis.  She met with Dr. Payton Mccallum briefly to discuss the above and [facility] by as she is graduating from  oncology follow-up.

## 2021-09-17 ENCOUNTER — Other Ambulatory Visit: Payer: Self-pay | Admitting: Neurology

## 2021-10-21 ENCOUNTER — Ambulatory Visit: Payer: Medicare Other | Admitting: Neurology

## 2021-10-26 ENCOUNTER — Other Ambulatory Visit: Payer: Self-pay

## 2021-10-26 MED ORDER — GABAPENTIN 300 MG PO CAPS: 300.0000 mg | ORAL_CAPSULE | Freq: Four times a day (QID) | ORAL | 0 refills | Status: DC

## 2021-11-09 ENCOUNTER — Ambulatory Visit
Admission: RE | Admit: 2021-11-09 | Discharge: 2021-11-09 | Disposition: A | Discharge status: Home / Self Care | Payer: Medicare Other | Source: Ambulatory Visit | Attending: Adult Health | Admitting: Adult Health

## 2021-11-09 ENCOUNTER — Telehealth: Payer: Self-pay

## 2021-11-09 DIAGNOSIS — E2839 Other primary ovarian failure: Secondary | ICD-10-CM

## 2021-11-09 DIAGNOSIS — M810 Age-related osteoporosis without current pathological fracture: Secondary | ICD-10-CM | POA: Diagnosis not present

## 2021-11-09 DIAGNOSIS — Z780 Asymptomatic menopausal state: Secondary | ICD-10-CM | POA: Diagnosis not present

## 2021-11-09 DIAGNOSIS — M85852 Other specified disorders of bone density and structure, left thigh: Secondary | ICD-10-CM | POA: Diagnosis not present

## 2021-11-09 NOTE — Telephone Encounter (Signed)
Called and spoke with pt, per [facility], bone density T score has worsened slightly and we recommended follow up with PCP or Gyn.  Pt verbalized understanding and thanks.  Pt states she will see her Gyn soon and discuss results and treatment then.

## 2021-11-09 NOTE — Telephone Encounter (Signed)
-----   Message from Gardenia Phlegm, NP sent at 11/09/2021  1:54 PM EST ----- Please let patient know that her bone density is slightly worse with osteoporosis.  She should follow-up with her PCP or gynecologist about this.  I am also happy to see her or have a virtual visit to discuss in further detail any recommendations. ----- Message ----- From: Interface, Rad Results In Sent: 11/09/2021  10:10 AM EST To: Gardenia Phlegm, NP

## 2021-11-18 ENCOUNTER — Telehealth: Payer: Self-pay | Admitting: Neurology

## 2021-11-18 NOTE — Telephone Encounter (Signed)
PA for brand Tecfidera 240 mg has been sent via CMM. ? ? (Key: [id number]) ? ?Your information has been sent to [facility]. ?

## 2021-11-18 NOTE — Telephone Encounter (Signed)
Error

## 2021-11-18 NOTE — Telephone Encounter (Signed)
Pt has asked that a message be sent to RN for Dr Felecia Shelling letting her know that [facility] is not wanting to pay for her [place] , please call.  ?

## 2021-11-19 NOTE — Telephone Encounter (Signed)
PA for Tecfidera has been approved This request has received a Favorable outcome. ? ?Please note any additional information provided by [facility] at the bottom of your screen. ? ?Request Reference Number: [id number]. TECFIDERA CAP 240MG  is approved through 09/19/2022. Your patient may now fill this prescription and it will be covered. ?

## 2021-11-19 NOTE — Telephone Encounter (Signed)
Pt updated on med approval. She verbalized understanding and appreciation for the call.  ?

## 2021-11-19 NOTE — Telephone Encounter (Signed)
[facility] faxed form requesting additional clinical questions be completed. I completed and faxed back to them at [phone]. Marked urgent. Waiting on determination. ?

## 2021-11-19 NOTE — Telephone Encounter (Signed)
Called and spoke with pt. Advised we submitted auth, waiting to hear back from insurance. Will update her as soon as we hear something. She verbalized understanding.  ?

## 2021-11-21 DIAGNOSIS — M545 Low back pain, unspecified: Secondary | ICD-10-CM | POA: Diagnosis not present

## 2021-12-14 ENCOUNTER — Ambulatory Visit (INDEPENDENT_AMBULATORY_CARE_PROVIDER_SITE_OTHER): Payer: Medicare Other | Admitting: Neurology

## 2021-12-14 ENCOUNTER — Encounter: Payer: Self-pay | Admitting: Neurology

## 2021-12-14 VITALS — BP 119/87 | HR 95 | Ht 71.0 in | Wt 128.0 lb

## 2021-12-14 DIAGNOSIS — G35 Multiple sclerosis: Secondary | ICD-10-CM

## 2021-12-14 DIAGNOSIS — G43709 Chronic migraine without aura, not intractable, without status migrainosus: Secondary | ICD-10-CM | POA: Diagnosis not present

## 2021-12-14 DIAGNOSIS — C50412 Malignant neoplasm of upper-outer quadrant of left female breast: Secondary | ICD-10-CM | POA: Diagnosis not present

## 2021-12-14 DIAGNOSIS — Z170 Estrogen receptor positive status [ER+]: Secondary | ICD-10-CM

## 2021-12-14 DIAGNOSIS — R252 Cramp and spasm: Secondary | ICD-10-CM

## 2021-12-14 DIAGNOSIS — Z79899 Other long term (current) drug therapy: Secondary | ICD-10-CM

## 2021-12-14 DIAGNOSIS — R208 Other disturbances of skin sensation: Secondary | ICD-10-CM

## 2021-12-14 DIAGNOSIS — G5131 Clonic hemifacial spasm, right: Secondary | ICD-10-CM

## 2021-12-14 DIAGNOSIS — R269 Unspecified abnormalities of gait and mobility: Secondary | ICD-10-CM

## 2021-12-14 MED ORDER — DIAZEPAM 5 MG PO TABS: ORAL_TABLET | ORAL | 0 refills | Status: DC

## 2021-12-14 MED ORDER — METHYLPHENIDATE HCL 20 MG PO TABS: 20.0000 mg | ORAL_TABLET | Freq: Two times a day (BID) | ORAL | 0 refills | Status: DC

## 2021-12-14 NOTE — Progress Notes (Signed)
? ?[facility] ? ?PATIENT: Stacy Logan ?DOB: [birth year] ? ?REFERRING DOCTOR OR PCP:  Annye Asa ?SOURCE: patient, EMR records from [facility].  MRI images on PACS ? ?_________________________________ ? ? ?HISTORICAL ? ?CHIEF COMPLAINT:  ?Chief Complaint  ?Patient presents with  ? Injections  ?  Pt alone, rm 16. Pt here for botox.   ? ? ?HISTORY OF PRESENT ILLNESS:  ?Stacy Logan is a 56 y.o. 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   She also has migraine headaches   ? ?Update 12/14/2021 ?Her headaches have generally done well with Botox.  She is 1 month late for the injections and has noted quite a few more the last month that she did in the first 2 and half months.  Before Botox, she had migraines more than 20 days a month and they lasted more than 4 hours a day.  She is having headaches about every other day now but just once or twice a week earlier in the cycle ? ?Facial twitching from the right hemifacial spasms also improved with Botox.  These had seem worse over the last couple weeks but not as bad as they were before she began Botox ? ?MS is stable on Tecfidera and she tolerates to well.  She has not had an exacerbation in many years.  Lymphocyte counts were 0.74 months ago.   ? ?She is walking about the same does not need walking aids.  She is noting more numbness and weakness in both hands.  Bladder function is fine, vision is fine. ? ?Her mood his doing well on Abilify, Effexor and  prn diazepam.    She sleeps well most nights.   Ritalin has helped her focus/attention and fatigue  ? ?She had breast cancer that was treated with surgery, radiation and chemotherapy.  She remains in remission.  She is now 5 years out ? ? ?MS/spine history:   In 2001, she was noted to have left hearing loss and had an MRI of the brain.   She did not have numbness or weakness a that time.  She was told she likely had MS.   She saw a  neurologist.  She never had an LP.   She reports she was told she had MS but was never started on any medication.   Starting about 5-6 years ago, she was noting more difficulty with gait and also noted some memory issues.   She was also having bladder issues with urgency and bladder incontinence x 5 years.      However, she had no health insurance at that time and did not follow up with neurology.   She saw Dr. Tomi Likens last year.   He ordered an MRI of the brian and cervical spine.   She had one enhancing lesion on the spine and one additional lesion.  An LP was ordered but she opted not to do the test.   She was started on tecfidera.   She has had some itching and flushing, especially if she does not take after food.     She also was found to have severe spinal stenosis and myelopathy.   She was sent to Dr. Arnoldo Morale of Neurosurgery.   She underwent  ACDF from C3-C7 06/2014.    She reports she is having more trouble with her arms since the surgery.   The worse pain is in the 2nd and 3rd fingers but she has altered sensation with some pain  in the other fingers as well.   She also has right > left lower neck pain.  Bladder is about the same (maybe slightly better) as it was before surgery.     ? ? ?Breast Ca:   She was diagnosed with breast cancer and had surgery, RadRx and chemo.  She remains in remission. ? ?MRI images:  ?MRI of the brain dated 01/12/2015 and the MRIs of the cervical spine dated 02/01/2015 and 05/22/2014 were reviewed.   The MRI of the brain shows white matter foci predominantly in the deep white matter but also in the periventricular and subcortical white matter of both hemispheres. The brainstem appears normal. There is no significant atrophy. The MRI of the cervical spine from 2015 showed severe spinal stenosis at C5-C6 and milder spinal stenosis at C6-C7 and C4-C5. There is an enhancing focus within the spinal cord adjacent to C5-C6 and she has another hyperintense focus at C3-C4. On the second  MRI, done without contrast, she is status post C3-C7 ACDF.    ? ?05/13/2018 MRI of the brain and cervical spine: The MRI of the brain shows multiple T2/FLAIR hyperintense foci in the hemispheres and in the pons.  The overall extent is similar to what was noted on the 01/12/2015 MRI.  Although some foci are consistent with multiple sclerosis, others would be more likely due to chronic microvascular ischemic changes.  None of the lesions appear to be acute.  MRI of the cervical spine shows T2 hyperintense foci within the spinal cord centrally adjacent to C3 and adjacent to C4-C5.  There are bilateral hyperintense foci, right greater than left and possibly within the anterior horns, adjacent to C6.  The C3 and C4-C5 foci are nonspecific and could be due to demyelination but could also be the sequela of compressive myelopathy.  The changes at C5-C6 are more consistent with the sequela of compressive myelopathy. ? ? ?REVIEW OF SYSTEMS: ?Constitutional: No fevers, chills, sweats, or change in appetite ?Eyes: as above ?Ear, nose and throat: No hearing loss, ear pain, nasal congestion, sore throat ?Cardiovascular: No chest pain, palpitations ?Respiratory:  No shortness of breath at rest or with exertion.   No wheezes ?GastrointestinaI: No nausea, vomiting, diarrhea, abdominal pain, fecal incontinence ?Genitourinary:  see above ?Musculoskeletal:  Some neck pain, back pain ?Integumentary: No rash, pruritus, skin lesions ?Neurological: as above ?Psychiatric: Notes depression and anxiety ?Endocrine: No palpitations, diaphoresis, change in appetite, change in weigh or increased thirst ?Hematologic/Lymphatic:  No anemia, purpura, petechiae. ?Allergic/Immunologic: No itchy/runny eyes, nasal congestion, recent allergic reactions, rashes ? ?ALLERGIES: ?Allergies  ?Allergen Reactions  ? Codeine   ?  Makes her "busy", itchy  ? Lamictal [Lamotrigine] Other (See Comments)  ?  Mood changes   ? Meperidine Nausea And Vomiting  ? Meperidine  Hcl Nausea And Vomiting  ?  SEVERE N/V  ? Morphine Other (See Comments)  ?  "SKIN CRAWLS"  ? ? ?HOME MEDICATIONS: ? ?Current Outpatient Medications:  ?  amitriptyline (ELAVIL) 25 MG tablet, Take 1-2 tablets (25-50 mg total) by mouth at bedtime., Disp: 180 tablet, Rfl: 3 ?  ARIPiprazole (ABILIFY) 5 MG tablet, Take 1 tablet (5 mg total) by mouth daily., Disp: 90 tablet, Rfl: 4 ?  baclofen (LIORESAL) 10 MG tablet, TAKE 1 TABLET BY MOUTH TWICE A DAY, Disp: 180 tablet, Rfl: 1 ?  BOTOX 100 units SOLR injection, INJECT 100 UNITS  INTRAMUSCULARLY EVERY 3  MONTHS (GIVEN AT  PRESCRIBERS OFFICE, DISCARD UNUSED), Disp: 100 Units, Rfl: 1 ?  gabapentin (NEURONTIN) 300 MG capsule, Take 1 capsule (300 mg total) by mouth 4 (four) times daily., Disp: 360 capsule, Rfl: 0 ?  OnabotulinumtoxinA (BOTOX IM), Inject into the muscle. Facial to treat MS, Disp: , Rfl:  ?  pantoprazole (PROTONIX) 40 MG tablet, Take 1 tablet (40 mg total) by mouth daily., Disp: 90 tablet, Rfl: 1 ?  phenazopyridine (PYRIDIUM) 200 MG tablet, Take 1 tablet (200 mg total) by mouth 3 (three) times daily., Disp: 6 tablet, Rfl: 0 ?  promethazine (PHENERGAN) 25 MG tablet, TAKE ONE TABLET BY MOUTH DAILY AS NEEDED FOR NAUSEA, Disp: 30 tablet, Rfl: 0 ?  rizatriptan (MAXALT) 5 MG tablet, 1 TAB AS NEEDED FOR MIGRAINE. MAY REPEAT IN 2 HRS IF NEEDED. MAX 2 TABS IN24HR OR 2-3 DOSES IN A WK., Disp: 10 tablet, Rfl: 5 ?  TECFIDERA 240 MG CPDR, TAKE 1 CAPSULE ('240MG'$ ) BY  MOUTH TWICE DAILY, Disp: 60 capsule, Rfl: 11 ?  valACYclovir (VALTREX) 1000 MG tablet, TAKE 2 TABLETS EVERY 12 HOURS X2 DOSES AS NEEDED FOR COLD SORES, Disp: 30 tablet, Rfl: 0 ?  venlafaxine XR (EFFEXOR-XR) 75 MG 24 hr capsule, Take 1 capsule (75 mg total) by mouth daily with breakfast., Disp: 30 capsule, Rfl: 5 ?  diazepam (VALIUM) 5 MG tablet, One po tid prn, Disp: 90 tablet, Rfl: 0 ?  methylphenidate (RITALIN) 20 MG tablet, Take 1 tablet (20 mg total) by mouth 2 (two) times daily with breakfast and lunch.,  Disp: 60 tablet, Rfl: 0 ? ?PAST MEDICAL HISTORY: ?Past Medical History:  ?Diagnosis Date  ? Anal fissure   ? Anxiety   ? Arthritis   ? Borderline diabetes   ? Cancer [facility])   ? left breast cancer  ? Depression

## 2021-12-15 LAB — COMPREHENSIVE METABOLIC PANEL
ALT: 15 IU/L (ref 0–32)
AST: 16 IU/L (ref 0–40)
Albumin/Globulin Ratio: 2 (ref 1.2–2.2)
Albumin: 4.5 g/dL (ref 3.8–4.9)
Alkaline Phosphatase: 116 IU/L (ref 44–121)
BUN/Creatinine Ratio: 13 (ref 9–23)
BUN: 9 mg/dL (ref 6–24)
Bilirubin Total: 0.3 mg/dL (ref 0.0–1.2)
CO2: 27 mmol/L (ref 20–29)
Calcium: 9.7 mg/dL (ref 8.7–10.2)
Chloride: 99 mmol/L (ref 96–106)
Creatinine, Ser: 0.67 mg/dL (ref 0.57–1.00)
Globulin, Total: 2.2 g/dL (ref 1.5–4.5)
Glucose: 97 mg/dL (ref 70–99)
Potassium: 4.3 mmol/L (ref 3.5–5.2)
Sodium: 136 mmol/L (ref 134–144)
Total Protein: 6.7 g/dL (ref 6.0–8.5)
eGFR: 102 mL/min/{1.73_m2} (ref >59)

## 2021-12-15 LAB — CBC WITH DIFFERENTIAL/PLATELET
Basophils Absolute: 0 10*3/uL (ref 0.0–0.2)
Basos: 0 %
EOS (ABSOLUTE): 0 10*3/uL (ref 0.0–0.4)
Eos: 0 %
Hematocrit: 37.2 % (ref 34.0–46.6)
Hemoglobin: 12.3 g/dL (ref 11.1–15.9)
Immature Grans (Abs): 0 10*3/uL (ref 0.0–0.1)
Immature Granulocytes: 0 %
Lymphocytes Absolute: 0.6 10*3/uL — ABNORMAL LOW (ref 0.7–3.1)
Lymphs: 11 %
MCH: 28 pg (ref 26.6–33.0)
MCHC: 33.1 g/dL (ref 31.5–35.7)
MCV: 85 fL (ref 79–97)
Monocytes Absolute: 0.4 10*3/uL (ref 0.1–0.9)
Monocytes: 7 %
Neutrophils Absolute: 4.4 10*3/uL (ref 1.4–7.0)
Neutrophils: 82 %
Platelets: 237 10*3/uL (ref 150–450)
RBC: 4.4 x10E6/uL (ref 3.77–5.28)
RDW: 14.5 % (ref 11.7–15.4)
WBC: 5.5 10*3/uL (ref 3.4–10.8)

## 2021-12-15 LAB — MAGNESIUM: Magnesium: 2.1 mg/dL (ref 1.6–2.3)

## 2021-12-16 ENCOUNTER — Telehealth: Payer: Self-pay | Admitting: Neurology

## 2021-12-16 NOTE — Telephone Encounter (Signed)
-----   Message from Britt Bottom, MD sent at 12/15/2021  6:29 PM EDT ----- ?Please let her know that the lymphocyte count is a little bit lower than I would like it to be which could increase her risk of infection.  She is currently on Tecfidera 240 mg twice a day. ? ?I would like her to alternate between 1 pill a day and 2 pills a day.   2-1-2-1-2-1.... ? ?We will check the blood count again at the next visit. ?

## 2021-12-16 NOTE — Telephone Encounter (Signed)
Called the pt to review the results of lab work. There was no answer and no VM. Will attempt again.  ?

## 2021-12-17 NOTE — Telephone Encounter (Signed)
Called the patient and advised of the findings. Informed her that Dr.Sater is making a change to the frequency of how she takes her medication. I informed her that he would like her to alternate 1 pill a day then 2 pill a day and alternate that way. Pt verbalized understanding. ?Pt had no questions at this time but was encouraged to call back if questions arise. ? ?

## 2021-12-31 ENCOUNTER — Other Ambulatory Visit: Payer: Self-pay | Admitting: Neurology

## 2022-01-13 DIAGNOSIS — H25813 Combined forms of age-related cataract, bilateral: Secondary | ICD-10-CM | POA: Diagnosis not present

## 2022-01-13 DIAGNOSIS — G35 Multiple sclerosis: Secondary | ICD-10-CM | POA: Diagnosis not present

## 2022-01-13 DIAGNOSIS — H04123 Dry eye syndrome of bilateral lacrimal glands: Secondary | ICD-10-CM | POA: Diagnosis not present

## 2022-01-13 DIAGNOSIS — G43B0 Ophthalmoplegic migraine, not intractable: Secondary | ICD-10-CM | POA: Diagnosis not present

## 2022-01-31 ENCOUNTER — Other Ambulatory Visit: Payer: Self-pay | Admitting: Neurology

## 2022-02-13 DIAGNOSIS — M545 Low back pain, unspecified: Secondary | ICD-10-CM | POA: Diagnosis not present

## 2022-02-13 DIAGNOSIS — S300XXA Contusion of lower back and pelvis, initial encounter: Secondary | ICD-10-CM | POA: Diagnosis not present

## 2022-03-01 ENCOUNTER — Telehealth: Payer: Self-pay

## 2022-03-01 NOTE — Telephone Encounter (Signed)
[employer] Nurse called in to report on Mariel PAD test resulting Lt leg 0.68 mild Rt 0.74 Mild

## 2022-03-08 ENCOUNTER — Telehealth: Payer: Self-pay | Admitting: Neurology

## 2022-03-08 NOTE — Telephone Encounter (Addendum)
Called pt to inform her of her balance of $744.23. If pt calls back you can route the call to me, or billing if pt wants to take care of her balance. However if pt does not want to settle her balance her appt does have to be cancelled per billing. I also sent pt a Mychart message.

## 2022-03-09 ENCOUNTER — Ambulatory Visit (INDEPENDENT_AMBULATORY_CARE_PROVIDER_SITE_OTHER): Payer: Medicare Other | Admitting: Neurology

## 2022-03-09 VITALS — BP 117/79 | HR 86 | Ht 69.0 in | Wt 129.6 lb

## 2022-03-09 DIAGNOSIS — R269 Unspecified abnormalities of gait and mobility: Secondary | ICD-10-CM

## 2022-03-09 DIAGNOSIS — G35 Multiple sclerosis: Secondary | ICD-10-CM | POA: Diagnosis not present

## 2022-03-09 DIAGNOSIS — G5131 Clonic hemifacial spasm, right: Secondary | ICD-10-CM | POA: Diagnosis not present

## 2022-03-09 DIAGNOSIS — G43709 Chronic migraine without aura, not intractable, without status migrainosus: Secondary | ICD-10-CM | POA: Diagnosis not present

## 2022-03-09 DIAGNOSIS — R208 Other disturbances of skin sensation: Secondary | ICD-10-CM | POA: Diagnosis not present

## 2022-03-09 DIAGNOSIS — F418 Other specified anxiety disorders: Secondary | ICD-10-CM

## 2022-03-09 NOTE — Progress Notes (Signed)
[facility]  PATIENT: Stacy Logan DOB: 03/29/1966  REFERRING DOCTOR OR PCP:  Annye Asa SOURCE: patient, EMR records from [facility].  MRI images on PACS  _________________________________   HISTORICAL  CHIEF COMPLAINT:  Chief Complaint  Patient presents with   Botulinum Toxin Injection    RM 1, alone. 200Ux1vial. Lot: [id number]. Exp: 10/2024. [place]: [id number].     HISTORY OF PRESENT ILLNESS:  Stacy Logan is a 56 y.o. 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   She also has migraine headaches    Update 12/14/2021 She reports doing very well with only a couple migraine headaches over the last 3 months..  Before Botox, she had migraines more than 20 days a month and they lasted more than 4 hours a day.  She is having headaches about every other day now but just once or twice a week earlier in the cycle  Facial twitching from the right hemifacial spasms also improved with Botox but she has noted some more twitches over the last week.    MS is stable on Tecfidera and she tolerates to well.  She has not had an exacerbation in many years.  She has had some mildly low lymphocyte counts and after the labs in March, I have changed her to every other day 1 pill twice daily and every other day 1 pill daily.    She is walking about the same does not need walking aids.  She is noting more numbness and weakness in both hands.  Bladder function is fine, vision is fine.  Her mood his doing well on Abilify, Effexor and  prn diazepam.    She sleeps well most nights.   Ritalin has helped her focus/attention and fatigue   She had breast cancer that was treated with surgery, radiation and chemotherapy.  She remains in remission.  She is now 5 years out   MS/spine history:   In 2001, she was noted to have left hearing loss and had an MRI of the brain.   She did not have numbness or weakness a that time.  She  was told she likely had MS.   She saw a neurologist.  She never had an LP.   She reports she was told she had MS but was never started on any medication.   Starting about 5-6 years ago, she was noting more difficulty with gait and also noted some memory issues.   She was also having bladder issues with urgency and bladder incontinence x 5 years.      However, she had no health insurance at that time and did not follow up with neurology.   She saw Dr. Tomi Likens last year.   He ordered an MRI of the brian and cervical spine.   She had one enhancing lesion on the spine and one additional lesion.  An LP was ordered but she opted not to do the test.   She was started on tecfidera.   She has had some itching and flushing, especially if she does not take after food.     She also was found to have severe spinal stenosis and myelopathy.   She was sent to Dr. Arnoldo Morale of Neurosurgery.   She underwent  ACDF from C3-C7 06/2014.    She reports she is having more trouble with her arms since the surgery.   The worse pain is in the 2nd and 3rd fingers but she has altered sensation with  some pain in the other fingers as well.   She also has right > left lower neck pain.  Bladder is about the same (maybe slightly better) as it was before surgery.       Breast Ca:   She was diagnosed with breast cancer and had surgery, RadRx and chemo.  She remains in remission.  MRI images:  MRI of the brain dated 01/12/2015 and the MRIs of the cervical spine dated 02/01/2015 and 05/22/2014 were reviewed.   The MRI of the brain shows white matter foci predominantly in the deep white matter but also in the periventricular and subcortical white matter of both hemispheres. The brainstem appears normal. There is no significant atrophy. The MRI of the cervical spine from 2015 showed severe spinal stenosis at C5-C6 and milder spinal stenosis at C6-C7 and C4-C5. There is an enhancing focus within the spinal cord adjacent to C5-C6 and she has another  hyperintense focus at C3-C4. On the second MRI, done without contrast, she is status post C3-C7 ACDF.     05/13/2018 MRI of the brain and cervical spine: The MRI of the brain shows multiple T2/FLAIR hyperintense foci in the hemispheres and in the pons.  The overall extent is similar to what was noted on the 01/12/2015 MRI.  Although some foci are consistent with multiple sclerosis, others would be more likely due to chronic microvascular ischemic changes.  None of the lesions appear to be acute.  MRI of the cervical spine shows T2 hyperintense foci within the spinal cord centrally adjacent to C3 and adjacent to C4-C5.  There are bilateral hyperintense foci, right greater than left and possibly within the anterior horns, adjacent to C6.  The C3 and C4-C5 foci are nonspecific and could be due to demyelination but could also be the sequela of compressive myelopathy.  The changes at C5-C6 are more consistent with the sequela of compressive myelopathy.   REVIEW OF SYSTEMS: Constitutional: No fevers, chills, sweats, or change in appetite Eyes: as above Ear, nose and throat: No hearing loss, ear pain, nasal congestion, sore throat Cardiovascular: No chest pain, palpitations Respiratory:  No shortness of breath at rest or with exertion.   No wheezes GastrointestinaI: No nausea, vomiting, diarrhea, abdominal pain, fecal incontinence Genitourinary:  see above Musculoskeletal:  Some neck pain, back pain Integumentary: No rash, pruritus, skin lesions Neurological: as above Psychiatric: Notes depression and anxiety Endocrine: No palpitations, diaphoresis, change in appetite, change in weigh or increased thirst Hematologic/Lymphatic:  No anemia, purpura, petechiae. Allergic/Immunologic: No itchy/runny eyes, nasal congestion, recent allergic reactions, rashes  ALLERGIES: Allergies  Allergen Reactions   Codeine     Makes her "busy", itchy   Lamictal [Lamotrigine] Other (See Comments)    Mood changes     Meperidine Nausea And Vomiting   Meperidine Hcl Nausea And Vomiting    SEVERE N/V   Morphine Other (See Comments)    "SKIN CRAWLS"    HOME MEDICATIONS:  Current Outpatient Medications:    amitriptyline (ELAVIL) 25 MG tablet, Take 1-2 tablets (25-50 mg total) by mouth at bedtime., Disp: 180 tablet, Rfl: 3   ARIPiprazole (ABILIFY) 5 MG tablet, Take 1 tablet (5 mg total) by mouth daily., Disp: 90 tablet, Rfl: 4   baclofen (LIORESAL) 10 MG tablet, TAKE 1 TABLET BY MOUTH TWICE A DAY, Disp: 180 tablet, Rfl: 1   BOTOX 100 units SOLR injection, INJECT 100 UNITS  INTRAMUSCULARLY EVERY 3  MONTHS (GIVEN AT  PRESCRIBERS OFFICE, DISCARD UNUSED), Disp: 100 Units, Rfl:  1   diazepam (VALIUM) 5 MG tablet, One po tid prn, Disp: 90 tablet, Rfl: 0   gabapentin (NEURONTIN) 300 MG capsule, TAKE 1 CAPSULE BY MOUTH 4 TIMES DAILY., Disp: 360 capsule, Rfl: 1   methylphenidate (RITALIN) 20 MG tablet, Take 1 tablet (20 mg total) by mouth 2 (two) times daily with breakfast and lunch., Disp: 60 tablet, Rfl: 0   OnabotulinumtoxinA (BOTOX IM), Inject into the muscle. Facial to treat MS, Disp: , Rfl:    pantoprazole (PROTONIX) 40 MG tablet, Take 1 tablet (40 mg total) by mouth daily., Disp: 90 tablet, Rfl: 1   phenazopyridine (PYRIDIUM) 200 MG tablet, Take 1 tablet (200 mg total) by mouth 3 (three) times daily., Disp: 6 tablet, Rfl: 0   promethazine (PHENERGAN) 25 MG tablet, TAKE ONE TABLET BY MOUTH DAILY AS NEEDED FOR NAUSEA, Disp: 30 tablet, Rfl: 0   rizatriptan (MAXALT) 5 MG tablet, 1 TAB AS NEEDED FOR MIGRAINE. MAY REPEAT IN 2 HRS IF NEEDED. MAX 2 TABS IN24HR OR 2-3 DOSES IN A WK., Disp: 10 tablet, Rfl: 5   TECFIDERA 240 MG CPDR, TAKE 1 CAPSULE ('240MG'$ ) BY  MOUTH TWICE DAILY, Disp: 60 capsule, Rfl: 11   valACYclovir (VALTREX) 1000 MG tablet, TAKE 2 TABLETS EVERY 12 HOURS X2 DOSES AS NEEDED FOR COLD SORES, Disp: 30 tablet, Rfl: 0   venlafaxine XR (EFFEXOR-XR) 75 MG 24 hr capsule, Take 1 capsule (75 mg total) by mouth daily  with breakfast., Disp: 30 capsule, Rfl: 5  PAST MEDICAL HISTORY: Past Medical History:  Diagnosis Date   Anal fissure    Anxiety    Arthritis    Borderline diabetes    Cancer (HCC)    left breast cancer   Depression    Frequency of urination    H/O cold sores    History of pneumothorax    1988-- SPONTANEOUS--  RESOLVED W/ CHEST TUBE   History of radiation therapy 07/29/16- 08/27/16   Left Breast 40.05 Gy in 15 fractions, Left Breast Boost 10 Gy in 5 fractions.    IC (interstitial cystitis)    Internal hemorrhoids    Lesion of bladder    Migraines    MS (multiple sclerosis) ([place])    MRI showed plague on her brain   Nocturia    Personal history of radiation therapy    Preeclampsia 1994   RSD (reflex sympathetic dystrophy)    Seasonal allergies    Tubular adenoma of colon    Urgency of urination     PAST SURGICAL HISTORY: Past Surgical History:  Procedure Laterality Date   ANTERIOR CERVICAL DECOMPRESSION/DISCECTOMY FUSION 4 LEVELS N/A 07/03/2014   Procedure: ANTERIOR CERVICAL DECOMPRESSION/DISCECTOMY FUSION 4 LEVELS;  Surgeon: Newman Pies, MD;  Location: [facility];  Service: Neurosurgery;  Laterality: N/A;  C3-4 C4-5 C5-6 C6-7 Anterior cervical decompression/diskectomy/fusion/interbody prosthesis/plate   APPENDECTOMY     CESAREAN SECTION  1994   CYSTOSCOPY WITH HYDRODISTENSION AND BIOPSY Bilateral 02/15/2014   Procedure: CYSTOSCOPY  BILATERAL RETROGRADE PYLOGRAM, HYDRODISTENSION, INSTILATION OF MARCAINE AND PYRIDIUM;  Surgeon: Ardis Hughs, MD;  Location: [facility];  Service: Urology;  Laterality: Bilateral;   D & C HYSTEROSCOPY WITH POLYPECTOMY  12-02-2003   DILATION AND CURETTAGE OF UTERUS     DX LAPAROSCOPY/  FULGERATION ENDOMETRIOSIS/  APPENDECTOMY  1985   RADIOACTIVE SEED GUIDED PARTIAL MASTECTOMY WITH AXILLARY SENTINEL LYMPH NODE BIOPSY Left 06/07/2016   Procedure: BREAST LUMPECTOMY WITH RADIOACTIVE SEED AND SENTINEL LYMPH NODE BIOPSY, INJECT  BLUE DYE LEFT  BREAST;  Surgeon: Fanny Skates, MD;  Location: [facility];  Service: General;  Laterality: Left;   TONSILLECTOMY AND ADENOIDECTOMY  1972    FAMILY HISTORY: Family History  Problem Relation Age of Onset   Hypertension Mother    Diabetes Father    Prostate cancer Father        dx late 1s   Lung cancer Father 7       smoker   Cancer Father        cheek of mouth, dx. between 44 and 45   Diabetes Paternal Grandmother    Diabetes Paternal Grandfather    Aneurysm Maternal Grandmother 14       d. brain aneurysm   Prostate cancer Maternal Grandfather    Diabetes Paternal Uncle        x 4   Diabetes Paternal Aunt    Colon cancer Maternal Uncle    Cancer Maternal Uncle        mouth cancer; +tobacco   Breast cancer Sister 50       s/p mastectomy   Breast cancer Maternal Aunt        dx 41s   Lymphoma Maternal Aunt 83   Uterine cancer Maternal Aunt        d. late 80s   Throat cancer Cousin        maternal 1st cousin; lim info   Cancer Paternal Uncle        NOS cancer   Breast cancer Cousin        paternal 1st cousin dx 71s   Esophageal cancer Neg Hx    Rectal cancer Neg Hx    Stomach cancer Neg Hx     SOCIAL HISTORY:  Social History   Socioeconomic History   Marital status: Single    Spouse name: Not on file   Number of children: 1   Years of education: Not on file   Highest education level: Not on file  Occupational History   Occupation: disabled  Tobacco Use   Smoking status: Former    Packs/day: 0.25    Years: 27.00    Total pack years: 6.75    Types: Cigarettes    Quit date: 01/17/2011    Years since quitting: 11.1   Smokeless tobacco: Never  Vaping Use   Vaping Use: Never used  Substance and Sexual Activity   Alcohol use: No   Drug use: No   Sexual activity: Never  Other Topics Concern   Not on file  Social History Narrative   Not on file   Social Determinants of Health   Financial Resource Strain: Not on file  Food  Insecurity: Not on file  Transportation Needs: Not on file  Physical Activity: Not on file  Stress: Not on file  Social Connections: Not on file  Intimate Partner Violence: Not on file     PHYSICAL EXAM  Vitals:   03/09/22 1011  BP: 117/79  Pulse: 86  Weight: 129 lb 9.6 oz (58.8 kg)  Height: '5\' 9"'$  (1.753 m)    Body mass index is 19.14 kg/m.   General: The patient is a thin woman in no acute distress   Neurologic Exam  Mental status: The patient is alert and oriented x 3 at the time of the examination. The patient has apparent normal recent and remote memory, with an apparently normal attention span and concentration ability.   Speech is normal.  Cranial nerves:  Marland Kitchen   Facial strength and sensation  is normal.  No eyelid drooping.  Trapezius and sternocleidomastoid strength is normal. No dysarthria is noted.  Hearing seems symmetric. Motor:  Muscle bulk is normal.   Muscle tone is normal.  She has 4+/5 strength in the right triceps and the intrinsic hand muscles on the right.  Strength was normal in the legs.  Sensory: She has decreased sensation in the right arm.  Normal sensation elsewhere. '  Gait and station: Station is normal.   Gait is fairly normal but the tandem gait is wide.  Romberg is negative  Reflexes: Deep tendon reflexes are increased in both legs with spread at the knees. There is no ankle clonus.Marland Kitchen       DIAGNOSTIC DATA (LABS, IMAGING, TESTING) - I reviewed patient records, labs, notes, testing and imaging myself where available.  Lab Results  Component Value Date   WBC 5.5 12/14/2021   HGB 12.3 12/14/2021   HCT 37.2 12/14/2021   MCV 85 12/14/2021   PLT 237 12/14/2021      Component Value Date/Time   NA 136 12/14/2021 1113   K 4.3 12/14/2021 1113   CL 99 12/14/2021 1113   CO2 27 12/14/2021 1113   GLUCOSE 97 12/14/2021 1113   GLUCOSE 99 08/17/2021 1113   BUN 9 12/14/2021 1113   CREATININE 0.67 12/14/2021 1113   CREATININE 0.55 12/26/2014 1042    CALCIUM 9.7 12/14/2021 1113   PROT 6.7 12/14/2021 1113   ALBUMIN 4.5 12/14/2021 1113   AST 16 12/14/2021 1113   ALT 15 12/14/2021 1113   ALKPHOS 116 12/14/2021 1113   BILITOT 0.3 12/14/2021 1113   GFRNONAA >60 08/17/2021 1113   GFRNONAA >89 12/26/2014 1042   GFRAA 114 02/01/2019 1047   GFRAA >89 12/26/2014 1042     __________________________________________________  BOTOX INJECTION  The risks and benefits of Botox injection brain to Mrs. Trias. We discussed the possibility of ptosis as the most common side effect with injection for blepharospasm and hemifacial spasm.   The following injections were performed using sterile technique.  Right frontalis (3 units 2) Left frontalis (3 units 2) Procerus and corrugators (4 units 3) Lateral canthus (2.5 units x 2)                  Right zygomaticus (10 units) Right Buccinator (5 units)  Left Buccinator  (5 units )  Temporalis/Occipitalis  (3 U x 10) 79 units injected into the muscles of the face/scalp  Splenius capitis (12.5 units 2) C3 paraspinal (12.5 units 2) C7 paraspinal (10 units 2)    Trapezius (15 U x 2) 100 units injected into the muscles of the neck   Total 179 units were injected and 21 wasted    ASSESSMENT AND PLAN    1. Multiple sclerosis ([place])   2. Chronic migraine w/o aura, not intractable, w/o stat migr   3. Hemifacial spasm of right side of face   4. Gait disturbance   5. Dysesthesia   6. Depression with anxiety      1.   Botox (179 units) was injected into the muscles of the face and neck as described above for hemifacial spasm and chronic migraine.  She tolerated injections well there are no complications.   2.   ContinueTecfidera 240 mg twice a day alternating with 240 mg once a day for MS.   at the next visit we will recheck the CBC with differential and if she is not in the normal range we will need to consider different  disease modifying therapy.   3.    For MS related ADD and hypersomnia,  she will continue Ritalin 20 mg 2 daily.  Continue Valium for spasticity and anxiety 4.   Mood is doing well  with Effexor and Abilify 5 mg daily.   5.   She will return in 3 months or sooner if there are new or worsening neurologic symptoms.  Cyncere Ruhe A. Felecia Shelling, MD, PhD 0/26/3785, 8:85 PM Certified in Neurology, Clinical Neurophysiology, Sleep Medicine, Pain Medicine and Neuroimaging  [facility] [address], [place], [place] [address] [phone]

## 2022-03-10 NOTE — Telephone Encounter (Signed)
Error

## 2022-04-09 ENCOUNTER — Encounter: Payer: Self-pay | Admitting: Internal Medicine

## 2022-04-16 ENCOUNTER — Other Ambulatory Visit: Payer: Self-pay | Admitting: Neurology

## 2022-04-16 DIAGNOSIS — G35 Multiple sclerosis: Secondary | ICD-10-CM

## 2022-04-18 ENCOUNTER — Other Ambulatory Visit: Payer: Self-pay | Admitting: Neurology

## 2022-04-30 ENCOUNTER — Ambulatory Visit: Payer: Self-pay | Admitting: Licensed Clinical Social Worker

## 2022-04-30 NOTE — Patient Outreach (Signed)
  Care Coordination   Initial Visit Note   04/30/2022 Name: Stacy Logan MRN: [id number] DOB: 1966/06/20  [name] is a 56 y.o. year old female who sees Pcp, No for primary care. I spoke with  Maryelizabeth Kaufmann Roszak by phone today  What matters to the patients health and wellness today? Client wants information on local food pantries in the area. She needs information on food resources to enable her to have adequate food supply each month.    Goals Addressed             This Visit's Progress    Patient said she needed to locate food pantries in the area. She said she neede resources in order to obtain addition food supplies each month.       Care Coordination Interventions:  Depression screen reviewed  PHQ2/PHQ9 completed Active listening / Reflection utilized  Emotional Support Provided Informed client about Care Coordination program support Gave client information on [place] in [place], [place] (food pantry) [place] client information on [place] , [place], [place] ([employer] box at [employer]) Discussed MS status of client Provided counseling support for client Discussed client use of U Card with [facility] Discussed client Food Stamps benefit    SDOH assessments and interventions completed:  Yes  SDOH Interventions Today    Flowsheet Row Most Recent Value  SDOH Interventions   Food Insecurity Interventions Other (Comment)  [gave client information about local food pantry]  Depression Interventions/Treatment  Counseling        Care Coordination Interventions Activated:  Yes  Care Coordination Interventions:  Yes, provided   Follow up plan: Follow up call scheduled for 05/18/22 at 1:30 PM    Encounter Outcome:  Pt. Visit Completed

## 2022-04-30 NOTE — Patient Instructions (Addendum)
Visit Information  Thank you for taking time to visit with me today. Please don't hesitate to contact me if I can be of assistance to you before our next scheduled telephone appointment.  Following are the goals we discussed today:   Our next appointment is by telephone on 05/18/22 at 1:30 PM   Please call the care guide team at [phone] if you need to cancel or reschedule your appointment.   If you are experiencing a Mental Health or [place] or need someone to talk to, please go to [facility] [place] [phone])   Following is a copy of your full plan of care:   Care Coordination Interventions:  Depression screen reviewed  PHQ2/PHQ9 completed Active listening / Reflection utilized  Emotional Support Provided Informed client about Care Coordination program support Gave client information on [place] in [place], [place] (food pantry) Gave client information on [place] , [place], [place] ([employer] box at [employer]) Discussed MS status of client Provided counseling support for client Discussed client use of U Card with [facility] Discussed client Food Stamps benefit   Stacy Logan was given information about Care Management services by the embedded care coordination team including:  Care Management services include personalized support from designated clinical staff supervised by her physician, including individualized plan of care and coordination with other care providers 24/7 contact phone numbers for assistance for urgent and routine care needs. The patient may stop CCM services at any time (effective at the end of the month) by phone call to the office staff.  Patient agreed to services and verbal consent obtained.   Norva Riffle.Kaitlin Ardito MSW, [place] Holiday representative [facility] Care Management [phone]

## 2022-05-18 ENCOUNTER — Ambulatory Visit: Payer: Self-pay | Admitting: Licensed Clinical Social Worker

## 2022-05-18 NOTE — Patient Outreach (Signed)
  Care Coordination   Follow Up Visit Note   05/18/2022 Name: Margan Elias MRN: [id number] DOB: 1966-08-26  Camyah Pultz is a 56 y.o. year old female who sees Pcp, No for primary care. I spoke with  Maryelizabeth Kaufmann [place] by phone today.  What matters to the patients health and wellness today? Client plans to contact food resource groups next week    Goals Addressed               This Visit's Progress     Patient said she appreciated phone call. She plans to look into food pantry resources next week (pt-stated)        Care Coordination Interventions:  Informed client about Care Coordination program support Gave client information on [place] in [place], [place] (food pantry) Gave client information on [place] , [place], [place] ([employer] box at [employer]) Encouraged  client to call LCSW at [phone] as needed for Care Coordination  SW support          SDOH assessments and interventions completed:  No     Care Coordination Interventions Activated:  Yes  Care Coordination Interventions:  Yes, provided   Follow up plan: Follow up call scheduled for 06/17/22 at 3:00 PM     Encounter Outcome:  Pt. Visit Completed

## 2022-05-18 NOTE — Patient Instructions (Addendum)
Visit Information  Thank you for taking time to visit with me today. Please don't hesitate to contact me if I can be of assistance to you before our next scheduled telephone appointment.  Following are the goals we discussed today:   Our next appointment is by telephone on 06/17/22 at 3:00 PM   Please call the care guide team at [phone] if you need to cancel or reschedule your appointment.   If you are experiencing a Mental Health or [place] or need someone to talk to, please go to [facility] [place] [phone])   Following is a copy of your full plan of care:   Care Coordination Interventions:  Informed client about Care Coordination program support Gave client information on [place] in [place], [place] (food pantry) Gave client information on [place] , [place], [place] ([employer] box at [employer]) Encouraged  client to call LCSW at [phone] as needed for Care Coordination  SW support    Ms. Summey was given information about Care Management services by the embedded care coordination team including:  Care Management services include personalized support from designated clinical staff supervised by her physician, including individualized plan of care and coordination with other care providers 24/7 contact phone numbers for assistance for urgent and routine care needs. The patient may stop CCM services at any time (effective at the end of the month) by phone call to the office staff.  Patient agreed to services and verbal consent obtained.   Norva Riffle.Chanceler Pullin MSW, [place] Holiday representative [facility] Care Management [phone]

## 2022-06-05 ENCOUNTER — Other Ambulatory Visit: Payer: Self-pay | Admitting: Neurology

## 2022-06-05 DIAGNOSIS — G35 Multiple sclerosis: Secondary | ICD-10-CM

## 2022-06-11 ENCOUNTER — Telehealth: Payer: Self-pay | Admitting: *Deleted

## 2022-06-11 NOTE — Chronic Care Management (AMB) (Signed)
  Care Coordination   Note   06/11/2022 Name: Stacy Logan MRN: [id number] DOB: 15-Mar-1966  [name] is a 56 y.o. year old female who sees Pcp, No for primary care. I reached out to Maryelizabeth Kaufmann [place] by phone today to reschedule follow up call with [place] for 06/17/22 call for  care coordination services.   Follow up plan:  Unsuccessful telephone outreach attempt made. A HIPAA compliant phone message was left for the patient providing contact information and requesting a return call.  Encounter Outcome:  No Answer  [place]  Direct Dial: [phone]

## 2022-06-11 NOTE — Chronic Care Management (AMB) (Signed)
  Care Coordination   Note   06/11/2022 Name: Stacy Logan MRN: [id number] DOB: 1966/08/18  Stacy Logan is a 56 y.o. year old female who sees Pcp, No for primary care. I reached out to Maryelizabeth Kaufmann [place] by phone today to reschedule call with SW for  care coordination services.   Follow up plan:  Telephone appointment with care coordination team member scheduled for:  06/14/22  Encounter Outcome:  Pt. Scheduled  [place]  Direct Dial: [phone]

## 2022-06-14 ENCOUNTER — Ambulatory Visit: Payer: Self-pay | Admitting: Licensed Clinical Social Worker

## 2022-06-14 NOTE — Patient Instructions (Addendum)
Visit Information  Thank you for taking time to visit with me today. Please don't hesitate to contact me if I can be of assistance to you before our next scheduled telephone appointment.  Following are the goals we discussed today:   Our next appointment is by telephone on 07/13/22 at 2:00 PM   Please call the care guide team at [phone] if you need to cancel or reschedule your appointment.   If you are experiencing a Mental Health or [place] or need someone to talk to, please go to [facility] [address], [place] [phone])   Following is a copy of your full plan of care:   Care Coordination Interventions:  Informed client about Care Coordination program support Gave client information on [place] in [place], [place] (food pantry) Gave client information on [place] , [place], [place] ([employer] box at [employer]) [place] client information on [employer] in [place], [place] (Public house manager) Discussed MS management of client. Encouraged her to look up MS society online and look for local MS resources as available. Reviewed ambulation of client Reviewed PCP of client. She said she sees Dr. Leeanne Rio in [place] as her PCP. Reviewed medication procurement of client Encouraged  client to call LCSW at [phone] as needed for Care Coordination  SW support   Stacy Logan was given information about Care Management services by the embedded care coordination team including:  Care Management services include personalized support from designated clinical staff supervised by her physician, including individualized plan of care and coordination with other care providers 24/7 contact phone numbers for assistance for urgent and routine care needs. The patient may stop CCM services at any time (effective at the end of the month) by phone call to the office staff.  Patient  agreed to services and verbal consent obtained.   Stacy Logan.Stacy Logan MSW, [place] Holiday representative [facility] Care Management [phone]

## 2022-06-14 NOTE — Patient Outreach (Signed)
  Care Coordination   Follow Up Visit Note  06/14/2022 Name: Louie Meaders MRN: [id number] DOB: Apr 06, 1966  Macaiah Mangal is a 56 y.o. year old female who sees Pcp, No for primary care. I spoke with  Maryelizabeth Kaufmann [place] by phone today.  What matters to the patients health and wellness today? Client needs to access food resources in the area. She plans to call several food pantries in [place], [place] to seek food assistance   Goals Addressed               This Visit's Progress     Patient said she has food needs and plans to call food pantries in the area for help (pt-stated)        Care Coordination Interventions:  Informed client about Care Coordination program support Gave client information on [place] in [place], [place] (food pantry) Gave client information on [place] , [place], [place] ([employer] box at [employer]) [place] client information on [employer] in [place], [place] (Public house manager) Discussed MS management of client. Encouraged her to look up MS society online and look for local MS resources as available. Reviewed ambulation of client Reviewed PCP of client. She said she sees Dr. Leeanne Rio in [place] as her PCP. Reviewed medication procurement of client Encouraged  client to call LCSW at [phone] as needed for Care Coordination  SW support Provided counseling support      SDOH assessments and interventions completed:  Yes    Care Coordination Interventions Activated:  Yes  Care Coordination Interventions:  Yes, provided   Follow up plan: Follow up call scheduled for 07/13/22 at 2:00 PM     Encounter Outcome:  Pt. Visit Completed

## 2022-06-17 ENCOUNTER — Encounter: Payer: Medicare Other | Admitting: Licensed Clinical Social Worker

## 2022-06-20 DIAGNOSIS — J069 Acute upper respiratory infection, unspecified: Secondary | ICD-10-CM | POA: Diagnosis not present

## 2022-06-20 DIAGNOSIS — H60502 Unspecified acute noninfective otitis externa, left ear: Secondary | ICD-10-CM | POA: Diagnosis not present

## 2022-06-20 DIAGNOSIS — Z1152 Encounter for screening for COVID-19: Secondary | ICD-10-CM | POA: Diagnosis not present

## 2022-06-28 DIAGNOSIS — Z6820 Body mass index (BMI) 20.0-20.9, adult: Secondary | ICD-10-CM | POA: Diagnosis not present

## 2022-07-01 ENCOUNTER — Other Ambulatory Visit: Payer: Self-pay | Admitting: Obstetrics and Gynecology

## 2022-07-01 DIAGNOSIS — Z853 Personal history of malignant neoplasm of breast: Secondary | ICD-10-CM

## 2022-07-02 ENCOUNTER — Other Ambulatory Visit: Payer: Self-pay | Admitting: Obstetrics and Gynecology

## 2022-07-02 DIAGNOSIS — E041 Nontoxic single thyroid nodule: Secondary | ICD-10-CM

## 2022-07-13 ENCOUNTER — Ambulatory Visit: Payer: Self-pay | Admitting: Licensed Clinical Social Worker

## 2022-07-13 NOTE — Patient Instructions (Signed)
Visit Information  Thank you for taking time to visit with me today. Please don't hesitate to contact me if I can be of assistance to you before our next scheduled telephone appointment.  Following are the goals we discussed today:   LCSW to call client on 08/03/22 at 2:00 PM   Please call the care guide team at [phone] if you need to cancel or reschedule your appointment.   If you are experiencing a Mental Health or [place] or need someone to talk to, please go to [facility] [place] [phone])   Following is a copy of your full plan of care:   Interventions: LCSW not able to speak with client via phone today. Answering machine was full. LCSW not able to leave phone message  Ms. Cara was given information about Care Management services by the embedded care coordination team including:  Care Management services include personalized support from designated clinical staff supervised by her physician, including individualized plan of care and coordination with other care providers 24/7 contact phone numbers for assistance for urgent and routine care needs. The patient may stop CCM services at any time (effective at the end of the month) by phone call to the office staff.  Patient agreed to services and verbal consent obtained.   Norva Riffle.Gumecindo Hopkin MSW, [place] Holiday representative [facility] Care Management [phone]

## 2022-07-13 NOTE — Patient Outreach (Signed)
  Care Coordination   07/13/2022 Name: Stacy Logan MRN: [id number] DOB: [birth year]   Care Coordination Outreach Attempts:  An unsuccessful telephone outreach was attempted today to offer the patient information about available care coordination services as a benefit of their health plan.   Follow Up Plan:  Additional outreach attempts will be made to offer the patient care coordination information and services.   Encounter Outcome:  No Answer  Care Coordination Interventions Activated:  No   Care Coordination Interventions:  No, not indicated  {THN Tip this will not be part of the note when signed-REQUIRED REPORT FIELD DO NOT DELETE (Optional):   Norva Riffle.Nikiya Starn MSW, [place] Holiday representative [facility] Care Management [phone]

## 2022-07-27 ENCOUNTER — Other Ambulatory Visit: Payer: Self-pay | Admitting: Neurology

## 2022-09-08 ENCOUNTER — Inpatient Hospital Stay: Admission: RE | Admit: 2022-09-08 | Payer: Medicare Other | Source: Ambulatory Visit

## 2022-09-09 DIAGNOSIS — Z20822 Contact with and (suspected) exposure to covid-19: Secondary | ICD-10-CM | POA: Diagnosis not present

## 2022-09-09 DIAGNOSIS — J069 Acute upper respiratory infection, unspecified: Secondary | ICD-10-CM | POA: Diagnosis not present

## 2022-09-09 DIAGNOSIS — R051 Acute cough: Secondary | ICD-10-CM | POA: Diagnosis not present

## 2022-10-05 ENCOUNTER — Other Ambulatory Visit: Payer: Self-pay | Admitting: Obstetrics and Gynecology

## 2022-10-05 DIAGNOSIS — Z853 Personal history of malignant neoplasm of breast: Secondary | ICD-10-CM

## 2023-01-17 DIAGNOSIS — G43B0 Ophthalmoplegic migraine, not intractable: Secondary | ICD-10-CM | POA: Diagnosis not present

## 2023-01-17 DIAGNOSIS — H25813 Combined forms of age-related cataract, bilateral: Secondary | ICD-10-CM | POA: Diagnosis not present

## 2023-01-17 DIAGNOSIS — H04123 Dry eye syndrome of bilateral lacrimal glands: Secondary | ICD-10-CM | POA: Diagnosis not present

## 2023-01-17 DIAGNOSIS — G35 Multiple sclerosis: Secondary | ICD-10-CM | POA: Diagnosis not present

## 2023-01-17 DIAGNOSIS — H524 Presbyopia: Secondary | ICD-10-CM | POA: Diagnosis not present

## 2023-02-07 DIAGNOSIS — J0100 Acute maxillary sinusitis, unspecified: Secondary | ICD-10-CM | POA: Diagnosis not present

## 2023-02-07 DIAGNOSIS — R052 Subacute cough: Secondary | ICD-10-CM | POA: Diagnosis not present

## 2023-02-20 ENCOUNTER — Encounter (HOSPITAL_BASED_OUTPATIENT_CLINIC_OR_DEPARTMENT_OTHER): Payer: Self-pay | Admitting: Emergency Medicine

## 2023-02-20 ENCOUNTER — Other Ambulatory Visit: Payer: Self-pay

## 2023-02-20 ENCOUNTER — Emergency Department (HOSPITAL_BASED_OUTPATIENT_CLINIC_OR_DEPARTMENT_OTHER)
Admission: EM | Admit: 2023-02-20 | Discharge: 2023-02-20 | Disposition: A | Discharge status: Home / Self Care | Payer: 59 | Attending: Emergency Medicine | Admitting: Emergency Medicine

## 2023-02-20 DIAGNOSIS — G43909 Migraine, unspecified, not intractable, without status migrainosus: Secondary | ICD-10-CM | POA: Diagnosis not present

## 2023-02-20 DIAGNOSIS — G43911 Migraine, unspecified, intractable, with status migrainosus: Secondary | ICD-10-CM

## 2023-02-20 DIAGNOSIS — R519 Headache, unspecified: Secondary | ICD-10-CM | POA: Diagnosis present

## 2023-02-20 DIAGNOSIS — R9431 Abnormal electrocardiogram [ECG] [EKG]: Secondary | ICD-10-CM | POA: Diagnosis not present

## 2023-02-20 DIAGNOSIS — F17210 Nicotine dependence, cigarettes, uncomplicated: Secondary | ICD-10-CM | POA: Diagnosis not present

## 2023-02-20 DIAGNOSIS — J810 Acute pulmonary edema: Secondary | ICD-10-CM | POA: Diagnosis not present

## 2023-02-20 DIAGNOSIS — R609 Edema, unspecified: Secondary | ICD-10-CM | POA: Diagnosis not present

## 2023-02-20 DIAGNOSIS — R079 Chest pain, unspecified: Secondary | ICD-10-CM | POA: Diagnosis not present

## 2023-02-20 LAB — CBG MONITORING, ED: Glucose-Capillary: 125 mg/dL — ABNORMAL HIGH (ref 70–99)

## 2023-02-20 MED ORDER — DEXAMETHASONE SODIUM PHOSPHATE 10 MG/ML IJ SOLN
10.0000 mg | Freq: Once | INTRAMUSCULAR | Status: AC
  Administered 2023-02-20: 10 mg via INTRAVENOUS
  Filled 2023-02-20: qty 1

## 2023-02-20 MED ORDER — METOCLOPRAMIDE HCL 5 MG/ML IJ SOLN
10.0000 mg | Freq: Once | INTRAMUSCULAR | Status: AC
  Administered 2023-02-20: 10 mg via INTRAVENOUS
  Filled 2023-02-20: qty 2

## 2023-02-20 MED ORDER — SODIUM CHLORIDE 0.9 % IV BOLUS
1000.0000 mL | Freq: Once | INTRAVENOUS | Status: AC
  Administered 2023-02-20: 1000 mL via INTRAVENOUS

## 2023-02-20 MED ORDER — SODIUM CHLORIDE 0.9 % IV SOLN: INTRAVENOUS | Status: DC

## 2023-02-20 MED ORDER — DIPHENHYDRAMINE HCL 50 MG/ML IJ SOLN
12.5000 mg | Freq: Once | INTRAMUSCULAR | Status: AC
  Administered 2023-02-20: 12.5 mg via INTRAVENOUS
  Filled 2023-02-20: qty 1

## 2023-02-20 NOTE — ED Provider Notes (Addendum)
[place] [facility] Provider Note   CSN: [id number] Arrival date & time: 02/20/23  2010     History  Chief Complaint  Patient presents with   Migraine   Emesis    Stacy Logan is a 57 y.o. female.  Patient with a known history of migraines.  Patient also has MS.  Migraine started 3 days ago.  Her normal migraine medicine is not abating it.  Patient has photophobia little bit of blurred vision headache is on top of the head.  No vomiting or diarrhea.  Patient is having some difficulty with some nausea.  Patient feels weak and dehydrated.  Patient states this is typical and classic for the migraine.  Past medical history significant for history of migraines history of MS borderline diabetes.  Patient denies any fevers.       Home Medications Prior to Admission medications   Medication Sig Start Date End Date Taking? Authorizing Provider  amitriptyline (ELAVIL) 25 MG tablet TAKE 1-2 TABLETS (25-50 MG TOTAL) BY MOUTH AT BEDTIME. 06/06/22   Sater, Pearletha Furl, MD  ARIPiprazole (ABILIFY) 5 MG tablet TAKE 1 TABLET (5 MG TOTAL) BY MOUTH DAILY. 07/27/22   Sater, Pearletha Furl, MD  baclofen (LIORESAL) 10 MG tablet TAKE 1 TABLET BY MOUTH TWICE A DAY 04/19/22   Sater, Pearletha Furl, MD  BOTOX 100 units SOLR injection INJECT 100 UNITS  INTRAMUSCULARLY EVERY 3  MONTHS (GIVEN AT  PRESCRIBERS OFFICE, DISCARD UNUSED) 10/25/18   Sater, Pearletha Furl, MD  diazepam (VALIUM) 5 MG tablet One po tid prn 12/14/21   Sater, Pearletha Furl, MD  gabapentin (NEURONTIN) 300 MG capsule TAKE 1 CAPSULE BY MOUTH 4 TIMES DAILY. 02/01/22   Sater, Pearletha Furl, MD  methylphenidate (RITALIN) 20 MG tablet Take 1 tablet (20 mg total) by mouth 2 (two) times daily with breakfast and lunch. 12/14/21   Sater, Pearletha Furl, MD  OnabotulinumtoxinA (BOTOX IM) Inject into the muscle. Facial to treat MS    [provider]  pantoprazole (PROTONIX) 40 MG tablet Take 1 tablet (40 mg total) by mouth daily. 06/02/17    Pyrtle, Carie Caddy, MD  phenazopyridine (PYRIDIUM) 200 MG tablet Take 1 tablet (200 mg total) by mouth 3 (three) times daily. 03/02/21   Liberty Handy, PA-C  promethazine (PHENERGAN) 25 MG tablet TAKE ONE TABLET BY MOUTH DAILY AS NEEDED FOR NAUSEA 05/31/19   Sater, Pearletha Furl, MD  rizatriptan (MAXALT) 5 MG tablet 1 TAB AS NEEDED FOR MIGRAINE. MAY REPEAT IN 2 HRS IF NEEDED. MAX 2 TABS IN24HR OR 2-3 DOSES IN A WK. 07/22/21   Sater, Pearletha Furl, MD  TECFIDERA 240 MG CPDR TAKE 1 CAPSULE (240MG ) BY  MOUTH TWICE DAILY 07/01/21   Sater, Pearletha Furl, MD  valACYclovir (VALTREX) 1000 MG tablet TAKE 2 TABLETS EVERY 12 HOURS X2 DOSES AS NEEDED FOR COLD SORES 04/18/17   Waldon Merl, PA-C  venlafaxine XR (EFFEXOR-XR) 75 MG 24 hr capsule Take 1 capsule (75 mg total) by mouth daily with breakfast. 07/16/15   Sater, Pearletha Furl, MD      Allergies    Codeine, Lamictal [lamotrigine], Meperidine, Meperidine hcl, and Morphine    Review of Systems   Review of Systems  Constitutional:  Negative for chills and fever.  HENT:  Negative for ear pain and sore throat.   Eyes:  Positive for photophobia and visual disturbance. Negative for pain.  Respiratory:  Negative for cough and shortness of breath.   Cardiovascular:  Negative for chest pain and palpitations.  Gastrointestinal:  Negative for abdominal pain and vomiting.  Genitourinary:  Negative for dysuria and hematuria.  Musculoskeletal:  Negative for arthralgias and back pain.  Skin:  Negative for color change and rash.  Neurological:  Positive for headaches. Negative for seizures and syncope.  All other systems reviewed and are negative.   Physical Exam Updated Vital Signs BP (!) 149/110   Pulse 99   Temp 98.6 F (37 C) (Oral)   Resp 18   SpO2 98%  Physical Exam Vitals and nursing note reviewed.  Constitutional:      General: She is not in acute distress.    Appearance: Normal appearance. She is well-developed. She is not ill-appearing.  HENT:      Head: Normocephalic and atraumatic.  Eyes:     Extraocular Movements: Extraocular movements intact.     Conjunctiva/sclera: Conjunctivae normal.     Pupils: Pupils are equal, round, and reactive to light.  Cardiovascular:     Rate and Rhythm: Normal rate and regular rhythm.     Heart sounds: No murmur heard. Pulmonary:     Effort: Pulmonary effort is normal. No respiratory distress.     Breath sounds: Normal breath sounds.  Abdominal:     Palpations: Abdomen is soft.     Tenderness: There is no abdominal tenderness.  Musculoskeletal:        General: No swelling.     Cervical back: Normal range of motion and neck supple.  Skin:    General: Skin is warm and dry.     Capillary Refill: Capillary refill takes less than 2 seconds.  Neurological:     General: No focal deficit present.     Mental Status: She is alert and oriented to person, place, and time.     Cranial Nerves: No cranial nerve deficit.     Sensory: No sensory deficit.     Motor: No weakness.  Psychiatric:        Mood and Affect: Mood normal.     ED Results / Procedures / Treatments   Labs (all labs ordered are listed, but only abnormal results are displayed) Labs Reviewed  CBG MONITORING, ED - Abnormal; Notable for the following components:      Result Value   Glucose-Capillary 125 (*)    All other components within normal limits    EKG None  Radiology No results found.  Procedures Procedures    Medications Ordered in ED Medications  0.9 %  sodium chloride infusion (0 mLs Intravenous Hold 02/20/23 2056)  sodium chloride 0.9 % bolus 1,000 mL (1,000 mLs Intravenous New Bag/Given 02/20/23 2042)  dexamethasone (DECADRON) injection 10 mg (10 mg Intravenous Given 02/20/23 2046)  diphenhydrAMINE (BENADRYL) injection 12.5 mg (12.5 mg Intravenous Given 02/20/23 2045)  metoCLOPramide (REGLAN) injection 10 mg (10 mg Intravenous Given 02/20/23 2045)    ED Course/ Medical Decision Making/ A&P                              Medical Decision Making Risk Prescription drug management.   Patient with typical migraine for her.  Will give IV fluids IV Decadron IV Reglan and IV Benadryl.  Then reassess. Patient feeling much better after the migraine cocktail.  Headache significantly improved.  She feels well enough to go home.  Will discharge home with rest in dark room.   Final Clinical Impression(s) / ED Diagnoses Final diagnoses:  Intractable migraine  with status migrainosus, unspecified migraine type    Rx / DC Orders ED Discharge Orders     None         Vanetta Mulders, MD 02/20/23 2113    Vanetta Mulders, MD 02/20/23 2159

## 2023-02-20 NOTE — Discharge Instructions (Signed)
Rest at home dark room.  Follow-up with your doctors as needed.  Return for any new or worse symptoms.  The long-acting medicine to kick in and relieve this migraine completely.

## 2023-02-20 NOTE — ED Notes (Signed)
D/c paperwork reviewed with pt, including follow up care.  No questions or concerns voiced at time of d/c. . Pt verbalized understanding, Ambulatory with family to ED exit, NAD.   

## 2023-02-20 NOTE — ED Triage Notes (Signed)
Pt here with migraine x3 days associated with photophobia, N/V/D. Pt also has MS. She reports she takes migraine medication and has had 2 doses today w/o relief. Pt feels weak and dehydrated

## 2023-03-06 DIAGNOSIS — R059 Cough, unspecified: Secondary | ICD-10-CM | POA: Diagnosis not present

## 2023-03-06 DIAGNOSIS — M7989 Other specified soft tissue disorders: Secondary | ICD-10-CM | POA: Diagnosis not present

## 2023-03-06 DIAGNOSIS — M79604 Pain in right leg: Secondary | ICD-10-CM | POA: Diagnosis not present

## 2023-03-06 DIAGNOSIS — R609 Edema, unspecified: Secondary | ICD-10-CM | POA: Diagnosis not present

## 2023-03-06 DIAGNOSIS — I517 Cardiomegaly: Secondary | ICD-10-CM | POA: Diagnosis not present

## 2023-03-06 DIAGNOSIS — G35 Multiple sclerosis: Secondary | ICD-10-CM | POA: Diagnosis not present

## 2023-03-06 DIAGNOSIS — Z888 Allergy status to other drugs, medicaments and biological substances status: Secondary | ICD-10-CM | POA: Diagnosis not present

## 2023-03-06 DIAGNOSIS — R600 Localized edema: Secondary | ICD-10-CM | POA: Diagnosis not present

## 2023-03-07 DIAGNOSIS — I517 Cardiomegaly: Secondary | ICD-10-CM | POA: Diagnosis not present

## 2023-03-08 ENCOUNTER — Other Ambulatory Visit: Payer: Self-pay | Admitting: Obstetrics and Gynecology

## 2023-03-08 DIAGNOSIS — Z1231 Encounter for screening mammogram for malignant neoplasm of breast: Secondary | ICD-10-CM

## 2023-03-09 ENCOUNTER — Ambulatory Visit
Admission: RE | Admit: 2023-03-09 | Discharge: 2023-03-09 | Disposition: A | Discharge status: Home / Self Care | Payer: 59 | Source: Ambulatory Visit | Attending: Obstetrics and Gynecology | Admitting: Obstetrics and Gynecology

## 2023-03-09 DIAGNOSIS — Z1231 Encounter for screening mammogram for malignant neoplasm of breast: Secondary | ICD-10-CM

## 2023-03-11 ENCOUNTER — Other Ambulatory Visit: Payer: Self-pay | Admitting: Neurology

## 2023-03-11 DIAGNOSIS — Z09 Encounter for follow-up examination after completed treatment for conditions other than malignant neoplasm: Secondary | ICD-10-CM | POA: Diagnosis not present

## 2023-03-11 DIAGNOSIS — G35 Multiple sclerosis: Secondary | ICD-10-CM | POA: Diagnosis not present

## 2023-03-11 DIAGNOSIS — C50912 Malignant neoplasm of unspecified site of left female breast: Secondary | ICD-10-CM | POA: Diagnosis not present

## 2023-03-11 DIAGNOSIS — R7989 Other specified abnormal findings of blood chemistry: Secondary | ICD-10-CM | POA: Diagnosis not present

## 2023-03-11 DIAGNOSIS — E7800 Pure hypercholesterolemia, unspecified: Secondary | ICD-10-CM | POA: Diagnosis not present

## 2023-03-11 DIAGNOSIS — R600 Localized edema: Secondary | ICD-10-CM | POA: Diagnosis not present

## 2023-03-23 DIAGNOSIS — D649 Anemia, unspecified: Secondary | ICD-10-CM | POA: Diagnosis not present

## 2023-03-23 DIAGNOSIS — R072 Precordial pain: Secondary | ICD-10-CM | POA: Diagnosis not present

## 2023-03-23 DIAGNOSIS — R0989 Other specified symptoms and signs involving the circulatory and respiratory systems: Secondary | ICD-10-CM | POA: Diagnosis not present

## 2023-03-23 DIAGNOSIS — E0590 Thyrotoxicosis, unspecified without thyrotoxic crisis or storm: Secondary | ICD-10-CM | POA: Diagnosis not present

## 2023-03-23 DIAGNOSIS — R030 Elevated blood-pressure reading, without diagnosis of hypertension: Secondary | ICD-10-CM | POA: Diagnosis not present

## 2023-03-23 DIAGNOSIS — R000 Tachycardia, unspecified: Secondary | ICD-10-CM | POA: Diagnosis not present

## 2023-03-23 DIAGNOSIS — R600 Localized edema: Secondary | ICD-10-CM | POA: Diagnosis not present

## 2023-04-01 DIAGNOSIS — E0590 Thyrotoxicosis, unspecified without thyrotoxic crisis or storm: Secondary | ICD-10-CM | POA: Diagnosis not present

## 2023-04-01 DIAGNOSIS — R600 Localized edema: Secondary | ICD-10-CM | POA: Diagnosis not present

## 2023-04-01 DIAGNOSIS — E010 Iodine-deficiency related diffuse (endemic) goiter: Secondary | ICD-10-CM | POA: Diagnosis not present

## 2023-04-05 DIAGNOSIS — E041 Nontoxic single thyroid nodule: Secondary | ICD-10-CM | POA: Diagnosis not present

## 2023-04-05 DIAGNOSIS — E0590 Thyrotoxicosis, unspecified without thyrotoxic crisis or storm: Secondary | ICD-10-CM | POA: Diagnosis not present

## 2023-04-08 DIAGNOSIS — R600 Localized edema: Secondary | ICD-10-CM | POA: Diagnosis not present

## 2023-04-08 DIAGNOSIS — G35 Multiple sclerosis: Secondary | ICD-10-CM | POA: Diagnosis not present

## 2023-04-08 DIAGNOSIS — Z853 Personal history of malignant neoplasm of breast: Secondary | ICD-10-CM | POA: Diagnosis not present

## 2023-04-08 DIAGNOSIS — E041 Nontoxic single thyroid nodule: Secondary | ICD-10-CM | POA: Diagnosis not present

## 2023-04-08 DIAGNOSIS — E0590 Thyrotoxicosis, unspecified without thyrotoxic crisis or storm: Secondary | ICD-10-CM | POA: Diagnosis not present

## 2023-04-11 DIAGNOSIS — R600 Localized edema: Secondary | ICD-10-CM | POA: Diagnosis not present

## 2023-04-11 DIAGNOSIS — R072 Precordial pain: Secondary | ICD-10-CM | POA: Diagnosis not present

## 2023-05-06 DIAGNOSIS — D649 Anemia, unspecified: Secondary | ICD-10-CM | POA: Diagnosis not present

## 2023-05-06 DIAGNOSIS — R000 Tachycardia, unspecified: Secondary | ICD-10-CM | POA: Diagnosis not present

## 2023-05-06 DIAGNOSIS — E0590 Thyrotoxicosis, unspecified without thyrotoxic crisis or storm: Secondary | ICD-10-CM | POA: Diagnosis not present

## 2023-05-06 DIAGNOSIS — R600 Localized edema: Secondary | ICD-10-CM | POA: Diagnosis not present

## 2023-05-06 DIAGNOSIS — R030 Elevated blood-pressure reading, without diagnosis of hypertension: Secondary | ICD-10-CM | POA: Diagnosis not present

## 2023-05-06 DIAGNOSIS — R072 Precordial pain: Secondary | ICD-10-CM | POA: Diagnosis not present

## 2023-05-06 DIAGNOSIS — R0989 Other specified symptoms and signs involving the circulatory and respiratory systems: Secondary | ICD-10-CM | POA: Diagnosis not present

## 2023-05-13 DIAGNOSIS — R600 Localized edema: Secondary | ICD-10-CM | POA: Diagnosis not present

## 2023-05-13 DIAGNOSIS — Z0000 Encounter for general adult medical examination without abnormal findings: Secondary | ICD-10-CM | POA: Diagnosis not present

## 2023-05-13 DIAGNOSIS — Z130 Encounter for screening for diseases of the blood and blood-forming organs and certain disorders involving the immune mechanism: Secondary | ICD-10-CM | POA: Diagnosis not present

## 2023-05-13 DIAGNOSIS — G35 Multiple sclerosis: Secondary | ICD-10-CM | POA: Diagnosis not present

## 2023-05-13 DIAGNOSIS — Z13220 Encounter for screening for lipoid disorders: Secondary | ICD-10-CM | POA: Diagnosis not present

## 2023-05-13 DIAGNOSIS — E0590 Thyrotoxicosis, unspecified without thyrotoxic crisis or storm: Secondary | ICD-10-CM | POA: Diagnosis not present

## 2023-05-13 DIAGNOSIS — E010 Iodine-deficiency related diffuse (endemic) goiter: Secondary | ICD-10-CM | POA: Diagnosis not present

## 2023-05-13 DIAGNOSIS — E041 Nontoxic single thyroid nodule: Secondary | ICD-10-CM | POA: Diagnosis not present

## 2023-06-06 DIAGNOSIS — E0590 Thyrotoxicosis, unspecified without thyrotoxic crisis or storm: Secondary | ICD-10-CM | POA: Diagnosis not present

## 2023-07-11 DIAGNOSIS — E0590 Thyrotoxicosis, unspecified without thyrotoxic crisis or storm: Secondary | ICD-10-CM | POA: Diagnosis not present

## 2023-08-15 DIAGNOSIS — M5441 Lumbago with sciatica, right side: Secondary | ICD-10-CM | POA: Diagnosis not present

## 2023-08-30 DIAGNOSIS — M4726 Other spondylosis with radiculopathy, lumbar region: Secondary | ICD-10-CM | POA: Diagnosis not present

## 2024-04-06 ENCOUNTER — Encounter: Payer: Self-pay | Admitting: Advanced Practice Midwife

## 2024-06-11 ENCOUNTER — Emergency Department (HOSPITAL_BASED_OUTPATIENT_CLINIC_OR_DEPARTMENT_OTHER)
Admission: EM | Admit: 2024-06-11 | Discharge: 2024-06-11 | Disposition: A | Discharge status: Home / Self Care | Source: Ambulatory Visit | Attending: Emergency Medicine | Admitting: Emergency Medicine

## 2024-06-11 ENCOUNTER — Encounter (HOSPITAL_BASED_OUTPATIENT_CLINIC_OR_DEPARTMENT_OTHER): Payer: Self-pay | Admitting: *Deleted

## 2024-06-11 ENCOUNTER — Emergency Department (HOSPITAL_BASED_OUTPATIENT_CLINIC_OR_DEPARTMENT_OTHER)

## 2024-06-11 ENCOUNTER — Other Ambulatory Visit: Payer: Self-pay

## 2024-06-11 DIAGNOSIS — Z79899 Other long term (current) drug therapy: Secondary | ICD-10-CM | POA: Diagnosis not present

## 2024-06-11 DIAGNOSIS — R42 Dizziness and giddiness: Secondary | ICD-10-CM | POA: Insufficient documentation

## 2024-06-11 DIAGNOSIS — Z853 Personal history of malignant neoplasm of breast: Secondary | ICD-10-CM | POA: Diagnosis not present

## 2024-06-11 DIAGNOSIS — I6782 Cerebral ischemia: Secondary | ICD-10-CM | POA: Diagnosis not present

## 2024-06-11 DIAGNOSIS — R519 Headache, unspecified: Secondary | ICD-10-CM | POA: Insufficient documentation

## 2024-06-11 DIAGNOSIS — G35 Multiple sclerosis: Secondary | ICD-10-CM | POA: Insufficient documentation

## 2024-06-11 LAB — CBC
HCT: 40.1 % (ref 36.0–46.0)
Hemoglobin: 13 g/dL (ref 12.0–15.0)
MCH: 27.4 pg (ref 26.0–34.0)
MCHC: 32.4 g/dL (ref 30.0–36.0)
MCV: 84.4 fL (ref 80.0–100.0)
Platelets: 282 K/uL (ref 150–400)
RBC: 4.75 MIL/uL (ref 3.87–5.11)
RDW: 15.3 % (ref 11.5–15.5)
WBC: 7.2 K/uL (ref 4.0–10.5)
nRBC: 0 % (ref 0.0–0.2)

## 2024-06-11 LAB — COMPREHENSIVE METABOLIC PANEL WITH GFR
ALT: 13 U/L (ref 0–44)
AST: 18 U/L (ref 15–41)
Albumin: 4.9 g/dL (ref 3.5–5.0)
Alkaline Phosphatase: 113 U/L (ref 38–126)
Anion gap: 12 (ref 5–15)
BUN: 12 mg/dL (ref 6–20)
CO2: 25 mmol/L (ref 22–32)
Calcium: 10.8 mg/dL — ABNORMAL HIGH (ref 8.9–10.3)
Chloride: 101 mmol/L (ref 98–111)
Creatinine, Ser: 0.7 mg/dL (ref 0.44–1.00)
GFR, Estimated: >60 mL/min (ref >60)
Glucose, Bld: 97 mg/dL (ref 70–99)
Potassium: 4.2 mmol/L (ref 3.5–5.1)
Sodium: 138 mmol/L (ref 135–145)
Total Bilirubin: 0.3 mg/dL (ref 0.0–1.2)
Total Protein: 7.7 g/dL (ref 6.5–8.1)

## 2024-06-11 LAB — URINALYSIS, W/ REFLEX TO CULTURE (INFECTION SUSPECTED)
Bacteria, UA: NONE SEEN
Bilirubin Urine: NEGATIVE
Glucose, UA: NEGATIVE mg/dL
Leukocytes,Ua: NEGATIVE
Nitrite: NEGATIVE
Specific Gravity, Urine: 1.034 — ABNORMAL HIGH (ref 1.005–1.030)
pH: 5.5 (ref 5.0–8.0)

## 2024-06-11 LAB — DIFFERENTIAL
Abs Immature Granulocytes: 0.01 K/uL (ref 0.00–0.07)
Basophils Absolute: 0 K/uL (ref 0.0–0.1)
Basophils Relative: 1 %
Eosinophils Absolute: 0.1 K/uL (ref 0.0–0.5)
Eosinophils Relative: 1 %
Immature Granulocytes: 0 %
Lymphocytes Relative: 25 %
Lymphs Abs: 1.8 K/uL (ref 0.7–4.0)
Monocytes Absolute: 0.5 K/uL (ref 0.1–1.0)
Monocytes Relative: 7 %
Neutro Abs: 4.8 K/uL (ref 1.7–7.7)
Neutrophils Relative %: 66 %

## 2024-06-11 LAB — PROTIME-INR
INR: 0.8 (ref 0.8–1.2)
Prothrombin Time: 12 s (ref 11.4–15.2)

## 2024-06-11 LAB — ETHANOL: Alcohol, Ethyl (B): <15 mg/dL (ref <15)

## 2024-06-11 LAB — APTT: aPTT: 30 s (ref 24–36)

## 2024-06-11 MED ORDER — MECLIZINE HCL 25 MG PO TABS: 25.0000 mg | ORAL_TABLET | Freq: Three times a day (TID) | ORAL | 0 refills | Status: AC | PRN

## 2024-06-11 MED ORDER — SODIUM CHLORIDE 0.9 % IV BOLUS
1000.0000 mL | Freq: Once | INTRAVENOUS | Status: AC
  Administered 2024-06-11: 1000 mL via INTRAVENOUS

## 2024-06-11 MED ORDER — ONDANSETRON 4 MG PO TBDP: 4.0000 mg | ORAL_TABLET | Freq: Three times a day (TID) | ORAL | 0 refills | Status: AC | PRN

## 2024-06-11 MED ORDER — MECLIZINE HCL 25 MG PO TABS
25.0000 mg | ORAL_TABLET | Freq: Once | ORAL | Status: AC
  Administered 2024-06-11: 25 mg via ORAL
  Filled 2024-06-11: qty 1

## 2024-06-11 NOTE — ED Provider Notes (Signed)
 [place] EMERGENCY DEPARTMENT AT [facility] Provider Note  CSN: [id number] Arrival date & time: 06/11/24 1947  Chief Complaint(s) Dizziness and Nausea  HPI Stacy Logan is a 58 y.o. female with a past medical history listed below including migraines, multiple sclerosis here for vertiginous symptoms described as room spinning and feeling drunk..  This began yesterday upon awakening around 10 AM.  Symptoms are worse with moving her eyes or turning her head.  Improved by sitting still.  Patient feels unsteady walking.  Patient does endorse headache different to her migraines to bilateral temporal region. At baseline she has lower extremity weakness from MS.  She denies any other physical complaints.   Dizziness   Past Medical History Past Medical History:  Diagnosis Date   Anal fissure    Anxiety    Arthritis    Borderline diabetes    Cancer (HCC)    left breast cancer   Depression    Frequency of urination    H/O cold sores    History of pneumothorax    1988-- SPONTANEOUS--  RESOLVED W/ CHEST TUBE   History of radiation therapy 07/29/16- 08/27/16   Left Breast 40.05 Gy in 15 fractions, Left Breast Boost 10 Gy in 5 fractions.    IC (interstitial cystitis)    Internal hemorrhoids    Lesion of bladder    Migraines    MS (multiple sclerosis)    MRI showed plague on her brain   Nocturia    Personal history of radiation therapy    Preeclampsia 1994   RSD (reflex sympathetic dystrophy)    Seasonal allergies    Tubular adenoma of colon    Urgency of urination    Patient Active Problem List   Diagnosis Date Noted   Chronic migraine w/o aura, not intractable, w/o stat migr 07/21/2020   High risk medication use 04/16/2020   Gait disturbance 02/23/2017   Displaced fracture of fifth metatarsal bone, left foot, initial encounter for open fracture 02/23/2017   Elbow pain, chronic, right 02/23/2017   History of foot fracture 02/23/2017   Malignant neoplasm of  upper-outer quadrant of left breast in female, estrogen receptor positive (HCC) 07/21/2016   Genetic testing 06/27/2016   Blepharospasm 05/19/2016   Hemifacial spasm 05/19/2016   Numbness 11/04/2015   Ulnar neuritis 11/04/2015   Physical exam 05/15/2015   Dysesthesia 04/15/2015   Other fatigue 04/15/2015   Urge incontinence 04/15/2015   Insomnia 04/15/2015   Memory disturbance 04/15/2015   Bilateral arm weakness 03/28/2015   Interstitial cystitis 03/28/2015   Influenza with respiratory manifestation other than pneumonia 11/29/2014   Sore in nose 10/16/2014   Thrush 08/22/2014   Acne cystica 07/31/2014   Cervical spondylosis with myelopathy and radiculopathy 07/03/2014   Hearing loss in left ear 05/30/2014   Special screening for malignant neoplasms, colon 05/02/2014   Constipation 05/02/2014   Chronic migraine 04/23/2014   Loss of weight 04/11/2014   Multiple sclerosis (HCC) 04/11/2014   Diffuse pain 04/11/2014   Glucosuria 01/16/2014   Depression with anxiety 01/16/2014   Recurrent cold sores 01/16/2014   HYPOKALEMIA 10/02/2007   ANEMIA, IRON DEFICIENCY 10/02/2007   UTI 10/02/2007   URI 08/08/2007   CONTUSION OF UNSPECIFIED SITE 08/01/2007   Home Medication(s) Prior to Admission medications   Medication Sig Start Date End Date Taking? Authorizing Provider  meclizine  (ANTIVERT ) 25 MG tablet Take 1 tablet (25 mg total) by mouth 3 (three) times daily as needed for dizziness. 06/11/24  Yes Myia Bergh, [employer]  Norman, MD  ondansetron  (ZOFRAN -ODT) 4 MG disintegrating tablet Take 1 tablet (4 mg total) by mouth every 8 (eight) hours as needed for up to 3 days for nausea or vomiting. 06/11/24 06/14/24 Yes Trenell Concannon, Raynell Norman, MD  amitriptyline  (ELAVIL ) 25 MG tablet TAKE 1-2 TABLETS (25-50 MG TOTAL) BY MOUTH AT BEDTIME. 06/06/22   Sater, Charlie LABOR, MD  ARIPiprazole  (ABILIFY ) 5 MG tablet TAKE 1 TABLET (5 MG TOTAL) BY MOUTH DAILY. 07/27/22   Sater, Charlie LABOR, MD  baclofen  (LIORESAL ) 10 MG  tablet TAKE 1 TABLET BY MOUTH TWICE A DAY 04/19/22   Sater, Charlie LABOR, MD  BOTOX 100 units SOLR injection INJECT 100 UNITS  INTRAMUSCULARLY EVERY 3  MONTHS (GIVEN AT  PRESCRIBERS OFFICE, DISCARD UNUSED) 10/25/18   Sater, Charlie LABOR, MD  diazepam  (VALIUM ) 5 MG tablet One po tid prn 12/14/21   Sater, Charlie LABOR, MD  gabapentin  (NEURONTIN ) 300 MG capsule TAKE 1 CAPSULE BY MOUTH 4 TIMES DAILY. 02/01/22   Sater, Charlie LABOR, MD  methylphenidate  (RITALIN ) 20 MG tablet Take 1 tablet (20 mg total) by mouth 2 (two) times daily with breakfast and lunch. 12/14/21   Sater, Charlie LABOR, MD  OnabotulinumtoxinA (BOTOX IM) Inject into the muscle. Facial to treat MS    [provider]  pantoprazole  (PROTONIX ) 40 MG tablet Take 1 tablet (40 mg total) by mouth daily. 06/02/17   Pyrtle, Gordy HERO, MD  phenazopyridine  (PYRIDIUM ) 200 MG tablet Take 1 tablet (200 mg total) by mouth 3 (three) times daily. 03/02/21   Gibbons, Claudia J, PA-C  promethazine  (PHENERGAN ) 25 MG tablet TAKE ONE TABLET BY MOUTH DAILY AS NEEDED FOR NAUSEA 05/31/19   Sater, Charlie LABOR, MD  rizatriptan  (MAXALT ) 5 MG tablet 1 TAB AS NEEDED FOR MIGRAINE. MAY REPEAT IN 2 HRS IF NEEDED. MAX 2 TABS IN24HR OR 2-3 DOSES IN A WK. 07/22/21   Sater, Charlie LABOR, MD  TECFIDERA  240 MG CPDR TAKE 1 CAPSULE (240MG ) BY  MOUTH TWICE DAILY 07/01/21   Sater, Charlie LABOR, MD  valACYclovir  (VALTREX ) 1000 MG tablet TAKE 2 TABLETS EVERY 12 HOURS X2 DOSES AS NEEDED FOR COLD SORES 04/18/17   Gladis Elsie BROCKS, PA-C  venlafaxine  XR (EFFEXOR -XR) 75 MG 24 hr capsule Take 1 capsule (75 mg total) by mouth daily with breakfast. 07/16/15   Sater, Charlie LABOR, MD                                                                                                                                    Allergies Codeine , Lamictal  [lamotrigine ], Meperidine , Meperidine  hcl, and Morphine   Review of Systems Review of Systems  Neurological:  Positive for dizziness.   As noted in HPI  Physical Exam Vital Signs   I have reviewed the triage vital signs BP (!) 151/99   Pulse 80   Temp 98 F (36.7 C) (Temporal)   Resp 20   SpO2 99%   Physical Exam  Vitals reviewed.  Constitutional:      General: She is not in acute distress.    Appearance: She is well-developed. She is not diaphoretic.  HENT:     Head: Normocephalic and atraumatic.     Right Ear: External ear normal.     Left Ear: External ear normal.     Nose: Nose normal.  Eyes:     General: No scleral icterus.    Conjunctiva/sclera: Conjunctivae normal.  Neck:     Trachea: Phonation normal.  Cardiovascular:     Rate and Rhythm: Normal rate and regular rhythm.  Pulmonary:     Effort: Pulmonary effort is normal. No respiratory distress.     Breath sounds: No stridor.  Abdominal:     General: There is no distension.  Musculoskeletal:        General: Normal range of motion.     Cervical back: Normal range of motion.  Neurological:     Mental Status: She is alert and oriented to person, place, and time.     Comments: Mental Status:  Alert and oriented to person, place, and time.  Attention and concentration normal.  Speech clear.  Recent memory is intact  Cranial Nerves:  II Visual Fields: Intact to confrontation. Visual fields intact. III, IV, VI: Mild ptosis on right eye. Pupils equal and reactive to light and near. Full eye movement without nystagmus  V Facial Sensation: Normal. No weakness of masticatory muscles  VII: No facial weakness or asymmetry  VIII Auditory Acuity: Grossly normal  IX/X: The uvula is midline; the palate elevates symmetrically  XI: Normal sternocleidomastoid and trapezius strength  XII: The tongue is midline. No atrophy or fasciculations.   Motor System: Muscle Strength: 5/5 and symmetric in the upper and 4+/5 ans symmetric in the lower extremities. .  Muscle Tone: Tone and muscle bulk are normal in the upper and lower extremities.  Coordination: Intact finger-to-nose, heel-to-shin. No tremor.   Sensation: Intact to light touch, and pinprick.  Gait: deferred    Psychiatric:        Behavior: Behavior normal.     ED Results and Treatments Labs (all labs ordered are listed, but only abnormal results are displayed) Labs Reviewed  COMPREHENSIVE METABOLIC PANEL WITH GFR - Abnormal; Notable for the following components:      Result Value   Calcium 10.8 (*)    All other components within normal limits  URINALYSIS, W/ REFLEX TO CULTURE (INFECTION SUSPECTED) - Abnormal; Notable for the following components:   Specific Gravity, Urine 1.034 (*)    Hgb urine dipstick SMALL (*)    Ketones, ur TRACE (*)    Protein, ur TRACE (*)    All other components within normal limits  ETHANOL  PROTIME-INR  APTT  CBC  DIFFERENTIAL                                                                                                                         EKG  EKG  Interpretation Date/Time:    Ventricular Rate:    PR Interval:    QRS Duration:    QT Interval:    QTC Calculation:   R Axis:      Text Interpretation:         Radiology CT HEAD WO CONTRAST Result Date: 06/11/2024 EXAM: CT HEAD WITHOUT CONTRAST 06/11/2024 08:37:48 PM TECHNIQUE: CT of the head was performed without the administration of intravenous contrast. Automated exposure control, iterative reconstruction, and/or weight based adjustment of the mA/kV was utilized to reduce the radiation dose to as low as reasonably achievable. COMPARISON: 07/29/2015 CLINICAL HISTORY: Vertigo, central. Pt to ED reporting waking at 1015 yesterday feeling drunk patient feeling unbalanced, difficulty speaking, headache and nausea. Hx of MS with recent flare up. Right eyelid droop is acute but pt also reporting her eye lid has drooped in the past with migraines but pt insisting this is different than her migraines in the past. FINDINGS: BRAIN AND VENTRICLES: No acute hemorrhage. No evidence of acute infarct. No hydrocephalus. No extra-axial collection.  No mass effect or midline shift. Subcortical and periventricular white matter hypodensities, possibly related to history of MS, although small vessel ischemic changes would look similar. ORBITS: No acute abnormality. SINUSES: No acute abnormality. SOFT TISSUES AND SKULL: No acute soft tissue abnormality. No skull fracture. IMPRESSION: 1. No acute intracranial abnormality. 2. Subcortical and periventricular white matter hypodensities, possibly related to history of MS, although small vessel ischemic changes would look similar. Electronically signed by: Pinkie Pebbles MD 06/11/2024 09:05 PM EDT RP Workstation: [id number]    Medications Ordered in ED Medications  sodium chloride  0.9 % bolus 1,000 mL (1,000 mLs Intravenous New Bag/Given 06/11/24 2042)  meclizine  (ANTIVERT ) tablet 25 mg (25 mg Oral Given 06/11/24 2042)   Procedures Procedures  (including critical care time) Medical Decision Making / ED Course   Medical Decision Making Amount and/or Complexity of Data Reviewed Labs: ordered. Decision-making details documented in ED Course. Radiology: ordered and independent interpretation performed. Decision-making details documented in ED Course.  Risk Prescription drug management.    Vertiginous symptoms differential diagnosis considered  Given her significant symptoms that are reproduced with movements and head turning, I suspect peripheral etiology however given her past medical history workup was broad.  In the meantime patient was given IV fluids and Antivert .  Patient's headache proved with thenar pressure points.  No signs of LVO concerning for large vessel occlusion.  CT head negative for ICH or mass effect.  She does have white matter hypodensities related to patient's history of MS. CBC without leukocytosis or anemia. Metabolic panel without significant electrolyte derangements or renal sufficiency. UA without evidence of infection.  Given her patient's history of MS, flare was  suspected however patient had near complete resolution of her vertiginous symptoms.  Patient was able to turn her head and ambulate without complications.  Do not think that MRI is necessary at this time.    Final Clinical Impression(s) / ED Diagnoses Final diagnoses:  Vertigo   The patient appears reasonably screened and/or stabilized for discharge and I doubt any other medical condition or other [facility] requiring further screening, evaluation, or treatment in the ED at this time. I have discussed the findings, Dx and Tx plan with the patient/family who expressed understanding and agree(s) with the plan. Discharge instructions discussed at length. The patient/family was given strict return precautions who verbalized understanding of the instructions. No further questions at time of discharge.  Disposition: Discharge  Condition: Good  ED Discharge  Orders          Ordered    meclizine  (ANTIVERT ) 25 MG tablet  3 times daily PRN        06/11/24 2156    ondansetron  (ZOFRAN -ODT) 4 MG disintegrating tablet  Every 8 hours PRN        06/11/24 2156             Follow Up: Arby Lyle LABOR, NP [address] LABOR [place] [id number] [phone]  Call      This chart was dictated using voice recognition software.  Despite best efforts to proofread,  errors can occur which can change the documentation meaning.    Trine Raynell Moder, MD 06/11/24 [phone]

## 2024-06-11 NOTE — ED Triage Notes (Signed)
 Pt to ED reporting waking at 1015 yesterday feeling drunk patient feeling unbalanced, difficulty speaking, headache and nausea. Hx of MS with recent flare up.   Right eyelid droop is acute but pt also reporting her eye lid has drooped in the past with migraines but pt insisting this is different than her migraines in the past.

## 2024-09-04 ENCOUNTER — Other Ambulatory Visit: Payer: Self-pay | Admitting: Urology

## 2024-09-05 ENCOUNTER — Encounter (HOSPITAL_COMMUNITY): Payer: Self-pay | Admitting: Urology

## 2024-09-05 NOTE — Anesthesia Preprocedure Evaluation (Signed)
 Anesthesia Evaluation  Patient identified by MRN, date of birth, ID band Patient awake    Reviewed: Allergy & Precautions, NPO status , Patient's Chart, lab work & pertinent test results  Airway Mallampati: II  TM Distance: >3 FB Neck ROM: Full    Dental  (+) Teeth Intact, Dental Advisory Given, Poor Dentition   Pulmonary Patient abstained from smoking., former smoker   breath sounds clear to auscultation       Cardiovascular hypertension,  Rhythm:Regular Rate:Normal     Neuro/Psych  Headaches PSYCHIATRIC DISORDERS Anxiety Depression     Neuromuscular disease    GI/Hepatic negative GI ROS, Neg liver ROS,,,  Endo/Other  negative endocrine ROS    Renal/GU negative Renal ROS     Musculoskeletal  (+) Arthritis ,    Abdominal   Peds  Hematology  (+) Blood dyscrasia, anemia   Anesthesia Other Findings   Reproductive/Obstetrics                              Anesthesia Physical Anesthesia Plan  ASA: 2  Anesthesia Plan: General   Post-op Pain Management: Tylenol  PO (pre-op)* and Toradol  IV (intra-op)*   Induction: Intravenous  PONV Risk Score and Plan: 4 or greater and Ondansetron , Dexamethasone , Midazolam  and Treatment may vary due to age or medical condition  Airway Management Planned: LMA  Additional Equipment: None  Intra-op Plan:   Post-operative Plan: Extubation in OR  Informed Consent: I have reviewed the patients History and Physical, chart, labs and discussed the procedure including the risks, benefits and alternatives for the proposed anesthesia with the patient or authorized representative who has indicated his/her understanding and acceptance.     Dental advisory given  Plan Discussed with: CRNA  Anesthesia Plan Comments:          Anesthesia Quick Evaluation

## 2024-09-05 NOTE — Progress Notes (Signed)
 Attempted to obtain medical history via telephone, unable to reach at this time. HIPAA compliant voicemail message left requesting return call to pre surgical testing department.

## 2024-09-05 NOTE — Progress Notes (Signed)
 For Anesthesia: PCP - Lyle DELENA Setters, NP  Cardiologist - Augusto ONEIDA Kendall, MD  Neurologist- Arvil Chew, NP     Bowel Prep reminder:  Chest x-ray -  EKG -  Stress Test -  ECHO - 04/11/23 [facility] Cardiac Cath -  Pacemaker/ICD device last checked: Pacemaker orders received: Device Rep notified:  Spinal Cord Stimulator:  Sleep Study -  CPAP -   Fasting Blood Sugar -  Checks Blood Sugar _____ times a day Date and result of last Hgb A1c-  Last dose of GLP1 agonist-  GLP1 instructions: Hold 7 days prior to schedule (Hold 24 hours-daily)   Last dose of SGLT-2 inhibitors-  SGLT-2 instructions: Hold 72 hours prior to surgery  Blood Thinner Instructions: Last Dose: Time last taken:  Aspirin Instructions: Last Dose: Time last taken:  Activity level: Can go up a flight of stairs and activities of daily living without stopping and without chest pain and/or shortness of breath   Able to exercise without chest pain and/or shortness of breath   Unable to go up a flight of stairs without chest pain and/or shortness of breath     Anesthesia review:   Patient denies shortness of breath, fever, cough and chest pain at PAT appointment   Patient verbalized understanding of instructions that were reviewed over the telephone.

## 2024-09-06 ENCOUNTER — Encounter (HOSPITAL_COMMUNITY): Payer: Self-pay | Admitting: Urology

## 2024-09-06 ENCOUNTER — Ambulatory Visit (HOSPITAL_COMMUNITY)
Admission: RE | Admit: 2024-09-06 | Discharge: 2024-09-06 | Disposition: A | Discharge status: Home / Self Care | Attending: Urology | Admitting: Urology

## 2024-09-06 ENCOUNTER — Ambulatory Visit (HOSPITAL_COMMUNITY): Admitting: Anesthesiology

## 2024-09-06 ENCOUNTER — Encounter (HOSPITAL_COMMUNITY)
Admission: RE | Disposition: A | Discharge status: Home / Self Care | Payer: Self-pay | Source: Home / Self Care | Attending: Urology

## 2024-09-06 DIAGNOSIS — F419 Anxiety disorder, unspecified: Secondary | ICD-10-CM | POA: Diagnosis not present

## 2024-09-06 DIAGNOSIS — M1990 Unspecified osteoarthritis, unspecified site: Secondary | ICD-10-CM | POA: Insufficient documentation

## 2024-09-06 DIAGNOSIS — N3941 Urge incontinence: Secondary | ICD-10-CM | POA: Diagnosis not present

## 2024-09-06 DIAGNOSIS — N3010 Interstitial cystitis (chronic) without hematuria: Secondary | ICD-10-CM

## 2024-09-06 DIAGNOSIS — Z87891 Personal history of nicotine dependence: Secondary | ICD-10-CM | POA: Insufficient documentation

## 2024-09-06 DIAGNOSIS — G8929 Other chronic pain: Secondary | ICD-10-CM | POA: Insufficient documentation

## 2024-09-06 DIAGNOSIS — I10 Essential (primary) hypertension: Secondary | ICD-10-CM | POA: Diagnosis not present

## 2024-09-06 DIAGNOSIS — R102 Pelvic and perineal pain unspecified side: Secondary | ICD-10-CM | POA: Insufficient documentation

## 2024-09-06 DIAGNOSIS — D649 Anemia, unspecified: Secondary | ICD-10-CM | POA: Diagnosis not present

## 2024-09-06 DIAGNOSIS — M6249 Contracture of muscle, multiple sites: Secondary | ICD-10-CM | POA: Diagnosis present

## 2024-09-06 DIAGNOSIS — F32A Depression, unspecified: Secondary | ICD-10-CM | POA: Insufficient documentation

## 2024-09-06 DIAGNOSIS — N3289 Other specified disorders of bladder: Secondary | ICD-10-CM | POA: Insufficient documentation

## 2024-09-06 DIAGNOSIS — R519 Headache, unspecified: Secondary | ICD-10-CM | POA: Insufficient documentation

## 2024-09-06 HISTORY — PX: CYSTO WITH HYDRODISTENSION: SHX5453

## 2024-09-06 LAB — CBC WITH DIFFERENTIAL/PLATELET
Abs Immature Granulocytes: 0.03 K/uL (ref 0.00–0.07)
Basophils Absolute: 0 K/uL (ref 0.0–0.1)
Basophils Relative: 0 %
Eosinophils Absolute: 0.2 K/uL (ref 0.0–0.5)
Eosinophils Relative: 4 %
HCT: 30.2 % — ABNORMAL LOW (ref 36.0–46.0)
Hemoglobin: 9.5 g/dL — ABNORMAL LOW (ref 12.0–15.0)
Immature Granulocytes: 1 %
Lymphocytes Relative: 12 %
Lymphs Abs: 0.7 K/uL (ref 0.7–4.0)
MCH: 27.1 pg (ref 26.0–34.0)
MCHC: 31.5 g/dL (ref 30.0–36.0)
MCV: 86.3 fL (ref 80.0–100.0)
Monocytes Absolute: 0.5 K/uL (ref 0.1–1.0)
Monocytes Relative: 9 %
Neutro Abs: 4.1 K/uL (ref 1.7–7.7)
Neutrophils Relative %: 74 %
Platelets: 275 K/uL (ref 150–400)
RBC: 3.5 MIL/uL — ABNORMAL LOW (ref 3.87–5.11)
RDW: 17 % — ABNORMAL HIGH (ref 11.5–15.5)
WBC: 5.5 K/uL (ref 4.0–10.5)
nRBC: 0 % (ref 0.0–0.2)

## 2024-09-06 LAB — BASIC METABOLIC PANEL WITH GFR
Anion gap: 9 (ref 5–15)
BUN: 10 mg/dL (ref 6–20)
CO2: 26 mmol/L (ref 22–32)
Calcium: 9.4 mg/dL (ref 8.9–10.3)
Chloride: 107 mmol/L (ref 98–111)
Creatinine, Ser: 0.53 mg/dL (ref 0.44–1.00)
GFR, Estimated: >60 mL/min (ref >60)
Glucose, Bld: 110 mg/dL — ABNORMAL HIGH (ref 70–99)
Potassium: 4.1 mmol/L (ref 3.5–5.1)
Sodium: 142 mmol/L (ref 135–145)

## 2024-09-06 SURGERY — CYSTOSCOPY, WITH BLADDER HYDRODISTENSION
Anesthesia: General

## 2024-09-06 MED ORDER — KETOROLAC TROMETHAMINE 30 MG/ML IJ SOLN
INTRAMUSCULAR | Status: AC
  Filled 2024-09-06: qty 1

## 2024-09-06 MED ORDER — OXYCODONE HCL 5 MG/5ML PO SOLN: 5.0000 mg | Freq: Once | ORAL | Status: DC | PRN

## 2024-09-06 MED ORDER — PROPOFOL 10 MG/ML IV BOLUS
INTRAVENOUS | Status: DC | PRN
  Administered 2024-09-06: 08:00:00 150 mg via INTRAVENOUS

## 2024-09-06 MED ORDER — GLYCOPYRROLATE 0.2 MG/ML IJ SOLN
INTRAMUSCULAR | Status: DC | PRN
  Administered 2024-09-06: 08:00:00 .1 mg via INTRAVENOUS

## 2024-09-06 MED ORDER — MIDAZOLAM HCL 5 MG/5ML IJ SOLN
INTRAMUSCULAR | Status: DC | PRN
  Administered 2024-09-06: 08:00:00 2 mg via INTRAVENOUS

## 2024-09-06 MED ORDER — FENTANYL CITRATE (PF) 50 MCG/ML IJ SOSY: 25.0000 ug | PREFILLED_SYRINGE | INTRAMUSCULAR | Status: DC | PRN

## 2024-09-06 MED ORDER — FENTANYL CITRATE (PF) 100 MCG/2ML IJ SOLN
INTRAMUSCULAR | Status: AC
  Filled 2024-09-06: qty 2

## 2024-09-06 MED ORDER — DEXAMETHASONE SOD PHOSPHATE PF 10 MG/ML IJ SOLN
INTRAMUSCULAR | Status: DC | PRN
  Administered 2024-09-06: 09:00:00 4 mg via INTRAVENOUS

## 2024-09-06 MED ORDER — OXYCODONE HCL 5 MG PO TABS: 5.0000 mg | ORAL_TABLET | Freq: Once | ORAL | Status: DC | PRN

## 2024-09-06 MED ORDER — STERILE WATER FOR IRRIGATION IR SOLN
Status: DC | PRN
  Administered 2024-09-06: 09:00:00 3000 mL

## 2024-09-06 MED ORDER — LIDOCAINE HCL (PF) 2 % IJ SOLN
INTRAMUSCULAR | Status: AC
  Filled 2024-09-06: qty 5

## 2024-09-06 MED ORDER — ONDANSETRON HCL 4 MG/2ML IJ SOLN
INTRAMUSCULAR | Status: DC | PRN
  Administered 2024-09-06: 09:00:00 4 mg via INTRAVENOUS

## 2024-09-06 MED ORDER — MIDAZOLAM HCL 2 MG/2ML IJ SOLN
INTRAMUSCULAR | Status: AC
  Filled 2024-09-06: qty 2

## 2024-09-06 MED ORDER — KETOROLAC TROMETHAMINE 30 MG/ML IJ SOLN
INTRAMUSCULAR | Status: DC | PRN
  Administered 2024-09-06: 09:00:00 30 mg via INTRAVENOUS

## 2024-09-06 MED ORDER — ONDANSETRON HCL 4 MG/2ML IJ SOLN
INTRAMUSCULAR | Status: AC
  Filled 2024-09-06: qty 2

## 2024-09-06 MED ORDER — LIDOCAINE HCL (CARDIAC) PF 100 MG/5ML IV SOSY
PREFILLED_SYRINGE | INTRAVENOUS | Status: DC | PRN
  Administered 2024-09-06: 08:00:00 60 mg via INTRAVENOUS

## 2024-09-06 MED ORDER — DROPERIDOL 2.5 MG/ML IJ SOLN: 0.6250 mg | Freq: Once | INTRAMUSCULAR | Status: DC | PRN

## 2024-09-06 MED ORDER — PROPOFOL 10 MG/ML IV BOLUS
INTRAVENOUS | Status: AC
  Filled 2024-09-06: qty 20

## 2024-09-06 MED ORDER — ACETAMINOPHEN 10 MG/ML IV SOLN: 1000.0000 mg | Freq: Once | INTRAVENOUS | Status: DC | PRN

## 2024-09-06 MED ORDER — FENTANYL CITRATE (PF) 100 MCG/2ML IJ SOLN
INTRAMUSCULAR | Status: DC | PRN
  Administered 2024-09-06: 09:00:00 25 ug via INTRAVENOUS
  Administered 2024-09-06: 08:00:00 50 ug via INTRAVENOUS
  Administered 2024-09-06: 09:00:00 25 ug via INTRAVENOUS

## 2024-09-06 MED ORDER — ORAL CARE MOUTH RINSE: 15.0000 mL | Freq: Once | OROMUCOSAL | Status: AC

## 2024-09-06 MED ADMIN — Chlorhexidine Gluconate Soln 0.12%: 15 mL | OROMUCOSAL | @ 07:00:00 | NDC 00904703580

## 2024-09-06 MED ADMIN — Ciprofloxacin 400 MG/200ML in D5W: 400 mg | INTRAVENOUS | @ 09:00:00 | NDC 25021019287

## 2024-09-06 MED FILL — Ciprofloxacin 400 MG/200ML in D5W: 400.0000 mg | INTRAVENOUS | Qty: 200 | Status: AC

## 2024-09-06 SURGICAL SUPPLY — 11 items
BAG URO CATCHER STRL LF (MISCELLANEOUS) ×1 IMPLANT
GLOVE SURG LX STRL 7.5 STRW (GLOVE) ×1 IMPLANT
GOWN STRL REUS W/ TWL LRG LVL3 (GOWN DISPOSABLE) ×2 IMPLANT
GOWN STRL REUS W/ TWL XL LVL3 (GOWN DISPOSABLE) ×2 IMPLANT
KIT TURNOVER KIT A (KITS) ×1 IMPLANT
MANIFOLD NEPTUNE II (INSTRUMENTS) ×1 IMPLANT
NDL SAFETY ECLIP 18X1.5 (MISCELLANEOUS) IMPLANT
PACK CYSTO (CUSTOM PROCEDURE TRAY) ×1 IMPLANT
PENCIL SMOKE EVACUATOR (MISCELLANEOUS) IMPLANT
TUBING CONNECTING 10 (TUBING) IMPLANT
WATER STERILE IRR 3000ML UROMA (IV SOLUTION) ×1 IMPLANT

## 2024-09-06 NOTE — Transfer of Care (Signed)
 Immediate Anesthesia Transfer of Care Note  Patient: Stacy Logan  Procedure(s) Performed: CYSTOSCOPY, WITH BLADDER HYDRODISTENSION  Patient Location: PACU  Anesthesia Type:General  Level of Consciousness: awake, alert , oriented, and patient cooperative  Airway & Oxygen Therapy: Patient Spontanous Breathing and Patient connected to face mask oxygen  Post-op Assessment: Report given to RN and Post -op Vital signs reviewed and stable  Post vital signs: Reviewed and stable  Last Vitals:  Vitals Value Taken Time  BP    Temp    Pulse    Resp    SpO2      Last Pain:  Vitals:   09/06/24 0704  TempSrc:   PainSc: 0-No pain         Complications: There were no known notable events for this encounter.

## 2024-09-06 NOTE — Op Note (Signed)
 Preoperative diagnosis:  Chronic pelvic pain, voiding dysfunction Refractory urinary urgency incontinence  Postoperative diagnosis:  Same  Procedure: Cystoscopy, hydrodistention  Surgeon: Morene MICAEL Salines, MD  Anesthesia: General  Complications: None  Intraoperative findings:  #1: The bladder was normal appearing on cystoscopy with no mucosal abnormalities, erythema or ulcerations. #2: The maximum bladder capacity was nearly 1 L.  EBL: Minimal  Specimens: None  Indication: Stacy Logan is a 58 y.o. patient with recalcitrant bladder pain and severe voiding symptoms.  After reviewing the management options for treatment, he elected to proceed with the above surgical procedure(s). We have discussed the potential benefits and risks of the procedure, side effects of the proposed treatment, the likelihood of the patient achieving the goals of the procedure, and any potential problems that might occur during the procedure or recuperation. Informed consent has been obtained.  Description of procedure:  Consent was obtained in the preoperative holding area.  She is then brought back to the operating room placed on table in supine position.  General esthesia was then induced endotracheal tube was inserted.  She was placed in dorsolithotomy position prepped and draped in the routine sterile fashion.  Timeout subsequently formed.  21 French 3 degree cystoscope was gently passed to the patient's urethra and into the bladder under visual guidance.  Cystoscopy was then performed with the above findings.  I subsequently emptied the bladder and then slowly filled to its maximum bladder capacity with the bladder irrigant hung to 1.8 m above the patient's abdomen.  Once the bladder was completely full I allowed it to dwell for 5 minutes.  I then drained it and measured the bladder capacity at 1 L.  Subsequently refilled the bladder slowly noting no significant areas of bleeding or hunner ulcers.   I then emptied the bladder.  She was subsequently awoken and returned the PACU in stable condition.

## 2024-09-06 NOTE — Anesthesia Procedure Notes (Signed)
 Procedure Name: LMA Insertion Date/Time: 09/06/2024 8:30 AM  Performed by: Erick Fitz, CRNAPre-anesthesia Checklist: Patient identified, Emergency Drugs available, Suction available, Patient being monitored and Timeout performed Patient Re-evaluated:Patient Re-evaluated prior to induction Oxygen Delivery Method: Circle system utilized Preoxygenation: Pre-oxygenation with 100% oxygen Induction Type: IV induction Ventilation: Mask ventilation without difficulty LMA: LMA with gastric port inserted LMA Size: 4.0 Number of attempts: 1 Placement Confirmation: positive ETCO2, CO2 detector and breath sounds checked- equal and bilateral Tube secured with: Tape Dental Injury: Teeth and Oropharynx as per pre-operative assessment

## 2024-09-06 NOTE — Anesthesia Postprocedure Evaluation (Signed)
 Anesthesia Post Note  Patient: Stacy Logan  Procedure(s) Performed: CYSTOSCOPY, WITH BLADDER HYDRODISTENSION     Patient location during evaluation: PACU Anesthesia Type: General Level of consciousness: awake and alert Pain management: pain level controlled Vital Signs Assessment: post-procedure vital signs reviewed and stable Respiratory status: spontaneous breathing, nonlabored ventilation, respiratory function stable and patient connected to nasal cannula oxygen Cardiovascular status: blood pressure returned to baseline and stable Postop Assessment: no apparent nausea or vomiting Anesthetic complications: no   There were no known notable events for this encounter.  Last Vitals:  Vitals:   09/06/24 0915 09/06/24 0930  BP: (!) 159/95 (!) 147/92  Pulse: 95 86  Resp: (!) 22 16  Temp:  36.5 C  SpO2: 99% 100%    Last Pain:  Vitals:   09/06/24 0930  TempSrc:   PainSc: 0-No pain                 [name] Bald

## 2024-09-06 NOTE — Interval H&P Note (Signed)
 History and Physical Interval Note:  09/06/2024 7:52 AM  Stacy Logan  has presented today for surgery, with the diagnosis of PELVIC FLOOR DYSFUNCTION.  The various methods of treatment have been discussed with the patient and family. After consideration of risks, benefits and other options for treatment, the patient has consented to  Procedures with comments: CYSTOSCOPY, WITH BLADDER HYDRODISTENSION (N/A) - CYSTOSCOPY WITH BLADDER HYDRODISTENSION as a surgical intervention.  The patient's history has been reviewed, patient examined, no change in status, stable for surgery.  I have reviewed the patient's chart and labs.  Questions were answered to the patient's satisfaction.     Morene LELON Salines

## 2024-09-07 ENCOUNTER — Encounter (HOSPITAL_COMMUNITY): Payer: Self-pay | Admitting: Urology
